# Patient Record
Sex: Male | Born: 1935 | Race: White | Hispanic: No | Marital: Married | Smoking: Former smoker
Health system: Southern US, Community
[De-identification: ages and names within clinical notes are randomized; demographics above are authoritative.]

## PROBLEM LIST (undated history)

## (undated) DIAGNOSIS — Z9289 Personal history of other medical treatment: Secondary | ICD-10-CM

## (undated) DIAGNOSIS — M2550 Pain in unspecified joint: Secondary | ICD-10-CM

## (undated) DIAGNOSIS — D126 Benign neoplasm of colon, unspecified: Secondary | ICD-10-CM

## (undated) DIAGNOSIS — N400 Enlarged prostate without lower urinary tract symptoms: Secondary | ICD-10-CM

## (undated) DIAGNOSIS — I7300 Raynaud's syndrome without gangrene: Secondary | ICD-10-CM

## (undated) DIAGNOSIS — Z9981 Dependence on supplemental oxygen: Secondary | ICD-10-CM

## (undated) DIAGNOSIS — D374 Neoplasm of uncertain behavior of colon: Secondary | ICD-10-CM

## (undated) DIAGNOSIS — G8929 Other chronic pain: Secondary | ICD-10-CM

## (undated) DIAGNOSIS — H269 Unspecified cataract: Secondary | ICD-10-CM

## (undated) DIAGNOSIS — E785 Hyperlipidemia, unspecified: Secondary | ICD-10-CM

## (undated) DIAGNOSIS — M1990 Unspecified osteoarthritis, unspecified site: Secondary | ICD-10-CM

## (undated) DIAGNOSIS — M549 Dorsalgia, unspecified: Secondary | ICD-10-CM

## (undated) DIAGNOSIS — I10 Essential (primary) hypertension: Secondary | ICD-10-CM

## (undated) DIAGNOSIS — R350 Frequency of micturition: Secondary | ICD-10-CM

## (undated) HISTORY — DX: Raynaud's syndrome without gangrene: I73.00

## (undated) HISTORY — DX: Benign neoplasm of colon, unspecified: D12.6

## (undated) HISTORY — PX: OTHER SURGICAL HISTORY: SHX169

## (undated) HISTORY — DX: Neoplasm of uncertain behavior of colon: D37.4

## (undated) HISTORY — PX: TONSILLECTOMY: SUR1361

## (undated) HISTORY — DX: Essential (primary) hypertension: I10

## (undated) HISTORY — DX: Hyperlipidemia, unspecified: E78.5

## (undated) HISTORY — PX: HERNIA REPAIR: SHX51

## (undated) HISTORY — PX: JOINT REPLACEMENT: SHX530

---
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
## 1993-11-12 HISTORY — PX: PROSTATE BIOPSY: SHX241

## 1998-07-13 ENCOUNTER — Encounter: Admission: RE | Admit: 1998-07-13 | Discharge: 1998-07-13 | Payer: Self-pay | Admitting: Sports Medicine

## 1998-10-14 ENCOUNTER — Encounter: Admission: RE | Admit: 1998-10-14 | Discharge: 1998-10-14 | Payer: Self-pay | Admitting: Family Medicine

## 1999-03-09 ENCOUNTER — Encounter: Admission: RE | Admit: 1999-03-09 | Discharge: 1999-03-09 | Payer: Self-pay | Admitting: Family Medicine

## 1999-12-06 ENCOUNTER — Encounter: Admission: RE | Admit: 1999-12-06 | Discharge: 1999-12-06 | Payer: Self-pay | Admitting: Family Medicine

## 1999-12-29 ENCOUNTER — Encounter: Admission: RE | Admit: 1999-12-29 | Discharge: 1999-12-29 | Payer: Self-pay | Admitting: Family Medicine

## 2000-01-31 ENCOUNTER — Encounter: Admission: RE | Admit: 2000-01-31 | Discharge: 2000-01-31 | Payer: Self-pay | Admitting: Sports Medicine

## 2000-06-14 DIAGNOSIS — D374 Neoplasm of uncertain behavior of colon: Secondary | ICD-10-CM

## 2000-06-14 HISTORY — DX: Neoplasm of uncertain behavior of colon: D37.4

## 2000-06-19 ENCOUNTER — Ambulatory Visit (HOSPITAL_COMMUNITY): Admission: RE | Admit: 2000-06-19 | Discharge: 2000-06-19 | Payer: Self-pay | Admitting: Gastroenterology

## 2000-06-19 ENCOUNTER — Encounter (INDEPENDENT_AMBULATORY_CARE_PROVIDER_SITE_OTHER): Payer: Self-pay | Admitting: *Deleted

## 2000-06-20 ENCOUNTER — Encounter: Admission: RE | Admit: 2000-06-20 | Discharge: 2000-06-20 | Payer: Self-pay | Admitting: Family Medicine

## 2000-09-03 ENCOUNTER — Encounter: Admission: RE | Admit: 2000-09-03 | Discharge: 2000-09-03 | Payer: Self-pay | Admitting: Family Medicine

## 2001-01-28 ENCOUNTER — Encounter: Admission: RE | Admit: 2001-01-28 | Discharge: 2001-01-28 | Payer: Self-pay | Admitting: Family Medicine

## 2001-08-14 DIAGNOSIS — D126 Benign neoplasm of colon, unspecified: Secondary | ICD-10-CM

## 2001-08-14 HISTORY — DX: Benign neoplasm of colon, unspecified: D12.6

## 2001-08-26 ENCOUNTER — Ambulatory Visit (HOSPITAL_COMMUNITY): Admission: RE | Admit: 2001-08-26 | Discharge: 2001-08-26 | Payer: Self-pay | Admitting: Gastroenterology

## 2001-08-26 ENCOUNTER — Encounter (INDEPENDENT_AMBULATORY_CARE_PROVIDER_SITE_OTHER): Payer: Self-pay

## 2001-12-16 ENCOUNTER — Encounter: Admission: RE | Admit: 2001-12-16 | Discharge: 2001-12-16 | Payer: Self-pay | Admitting: Family Medicine

## 2001-12-31 ENCOUNTER — Encounter: Admission: RE | Admit: 2001-12-31 | Discharge: 2001-12-31 | Payer: Self-pay | Admitting: Family Medicine

## 2002-02-21 ENCOUNTER — Encounter: Admission: RE | Admit: 2002-02-21 | Discharge: 2002-02-21 | Payer: Self-pay | Admitting: Family Medicine

## 2002-05-26 ENCOUNTER — Encounter: Admission: RE | Admit: 2002-05-26 | Discharge: 2002-05-26 | Payer: Self-pay | Admitting: Family Medicine

## 2003-03-31 ENCOUNTER — Encounter: Admission: RE | Admit: 2003-03-31 | Discharge: 2003-03-31 | Payer: Self-pay | Admitting: Family Medicine

## 2003-04-07 ENCOUNTER — Encounter: Admission: RE | Admit: 2003-04-07 | Discharge: 2003-04-07 | Payer: Self-pay | Admitting: Family Medicine

## 2003-06-03 ENCOUNTER — Encounter: Admission: RE | Admit: 2003-06-03 | Discharge: 2003-06-03 | Payer: Self-pay | Admitting: Family Medicine

## 2003-12-25 ENCOUNTER — Encounter: Admission: RE | Admit: 2003-12-25 | Discharge: 2003-12-25 | Payer: Self-pay | Admitting: Sports Medicine

## 2004-01-05 ENCOUNTER — Encounter: Admission: RE | Admit: 2004-01-05 | Discharge: 2004-01-05 | Payer: Self-pay | Admitting: Family Medicine

## 2004-01-06 ENCOUNTER — Encounter: Admission: RE | Admit: 2004-01-06 | Discharge: 2004-01-06 | Payer: Self-pay | Admitting: Family Medicine

## 2004-01-14 ENCOUNTER — Ambulatory Visit (HOSPITAL_COMMUNITY): Admission: RE | Admit: 2004-01-14 | Discharge: 2004-01-14 | Payer: Self-pay | Admitting: Family Medicine

## 2004-01-27 ENCOUNTER — Encounter: Admission: RE | Admit: 2004-01-27 | Discharge: 2004-01-27 | Payer: Self-pay | Admitting: Family Medicine

## 2004-05-31 ENCOUNTER — Ambulatory Visit: Payer: Self-pay | Admitting: Family Medicine

## 2004-08-16 ENCOUNTER — Ambulatory Visit: Payer: Self-pay | Admitting: Sports Medicine

## 2004-09-19 ENCOUNTER — Ambulatory Visit (HOSPITAL_COMMUNITY): Admission: RE | Admit: 2004-09-19 | Discharge: 2004-09-19 | Payer: Self-pay | Admitting: Gastroenterology

## 2004-09-20 ENCOUNTER — Ambulatory Visit: Payer: Self-pay | Admitting: Family Medicine

## 2004-09-26 ENCOUNTER — Ambulatory Visit: Payer: Self-pay | Admitting: Family Medicine

## 2004-11-09 ENCOUNTER — Ambulatory Visit (HOSPITAL_COMMUNITY): Admission: RE | Admit: 2004-11-09 | Discharge: 2004-11-09 | Payer: Self-pay | Admitting: Vascular Surgery

## 2004-11-11 ENCOUNTER — Encounter (INDEPENDENT_AMBULATORY_CARE_PROVIDER_SITE_OTHER): Payer: Self-pay | Admitting: Specialist

## 2004-11-11 ENCOUNTER — Inpatient Hospital Stay (HOSPITAL_COMMUNITY): Admission: RE | Admit: 2004-11-11 | Discharge: 2004-11-14 | Payer: Self-pay | Admitting: Vascular Surgery

## 2005-03-28 ENCOUNTER — Ambulatory Visit: Payer: Self-pay | Admitting: Family Medicine

## 2005-06-02 ENCOUNTER — Ambulatory Visit: Payer: Self-pay | Admitting: Family Medicine

## 2005-07-26 ENCOUNTER — Encounter: Payer: Self-pay | Admitting: Internal Medicine

## 2006-02-22 ENCOUNTER — Ambulatory Visit: Payer: Self-pay | Admitting: Family Medicine

## 2006-03-20 ENCOUNTER — Ambulatory Visit: Payer: Self-pay | Admitting: Family Medicine

## 2006-05-25 ENCOUNTER — Ambulatory Visit: Payer: Self-pay | Admitting: Family Medicine

## 2006-09-04 ENCOUNTER — Ambulatory Visit: Payer: Self-pay | Admitting: Sports Medicine

## 2006-09-11 ENCOUNTER — Ambulatory Visit: Payer: Self-pay | Admitting: Family Medicine

## 2006-09-20 ENCOUNTER — Ambulatory Visit: Payer: Self-pay | Admitting: Family Medicine

## 2006-10-11 DIAGNOSIS — R269 Unspecified abnormalities of gait and mobility: Secondary | ICD-10-CM | POA: Insufficient documentation

## 2006-10-11 DIAGNOSIS — L408 Other psoriasis: Secondary | ICD-10-CM | POA: Insufficient documentation

## 2006-10-11 DIAGNOSIS — E785 Hyperlipidemia, unspecified: Secondary | ICD-10-CM | POA: Insufficient documentation

## 2006-10-11 DIAGNOSIS — I10 Essential (primary) hypertension: Secondary | ICD-10-CM | POA: Insufficient documentation

## 2006-10-11 DIAGNOSIS — J439 Emphysema, unspecified: Secondary | ICD-10-CM | POA: Insufficient documentation

## 2006-10-11 DIAGNOSIS — J449 Chronic obstructive pulmonary disease, unspecified: Secondary | ICD-10-CM | POA: Insufficient documentation

## 2006-10-11 DIAGNOSIS — K649 Unspecified hemorrhoids: Secondary | ICD-10-CM | POA: Insufficient documentation

## 2006-10-23 ENCOUNTER — Ambulatory Visit: Payer: Self-pay | Admitting: Vascular Surgery

## 2006-11-20 ENCOUNTER — Telehealth: Payer: Self-pay | Admitting: Family Medicine

## 2007-03-18 ENCOUNTER — Encounter: Payer: Self-pay | Admitting: Family Medicine

## 2007-04-02 ENCOUNTER — Encounter: Payer: Self-pay | Admitting: Family Medicine

## 2007-04-08 ENCOUNTER — Encounter: Payer: Self-pay | Admitting: Family Medicine

## 2007-04-23 ENCOUNTER — Ambulatory Visit: Payer: Self-pay | Admitting: Family Medicine

## 2007-04-23 DIAGNOSIS — Q549 Hypospadias, unspecified: Secondary | ICD-10-CM | POA: Insufficient documentation

## 2007-04-23 LAB — CONVERTED CEMR LAB
ALT: 20 units/L (ref 0–53)
AST: 17 units/L (ref 0–37)
Albumin: 4.5 g/dL (ref 3.5–5.2)
Alkaline Phosphatase: 68 units/L (ref 39–117)
BUN: 9 mg/dL (ref 6–23)
CO2: 26 meq/L (ref 19–32)
Calcium: 9.9 mg/dL (ref 8.4–10.5)
Chloride: 103 meq/L (ref 96–112)
Cholesterol: 155 mg/dL (ref 0–200)
Creatinine, Ser: 0.82 mg/dL (ref 0.40–1.50)
Glucose, Bld: 100 mg/dL — ABNORMAL HIGH (ref 70–99)
HCT: 45.7 % (ref 39.0–52.0)
HDL: 58 mg/dL (ref >39)
Hemoglobin: 15.6 g/dL (ref 13.0–17.0)
LDL Cholesterol: 71 mg/dL (ref 0–99)
MCHC: 34.1 g/dL (ref 30.0–36.0)
MCV: 92.9 fL (ref 78.0–100.0)
PSA: 4.4 ng/mL — ABNORMAL HIGH (ref 0.10–4.00)
Platelets: 252 10*3/uL (ref 150–400)
Potassium: 3.7 meq/L (ref 3.5–5.3)
RBC: 4.92 M/uL (ref 4.22–5.81)
RDW: 12.7 % (ref 11.5–14.0)
Sodium: 141 meq/L (ref 135–145)
Total Bilirubin: 0.9 mg/dL (ref 0.3–1.2)
Total CHOL/HDL Ratio: 2.7
Total Protein: 7 g/dL (ref 6.0–8.3)
Triglycerides: 129 mg/dL (ref <150)
VLDL: 26 mg/dL (ref 0–40)
WBC: 7.5 10*3/uL (ref 4.0–10.5)

## 2007-04-24 ENCOUNTER — Encounter: Payer: Self-pay | Admitting: Family Medicine

## 2007-05-27 ENCOUNTER — Encounter: Payer: Self-pay | Admitting: Family Medicine

## 2007-05-29 ENCOUNTER — Ambulatory Visit: Payer: Self-pay | Admitting: Family Medicine

## 2007-07-03 ENCOUNTER — Telehealth: Payer: Self-pay | Admitting: *Deleted

## 2007-10-16 ENCOUNTER — Telehealth: Payer: Self-pay | Admitting: Family Medicine

## 2007-10-22 ENCOUNTER — Ambulatory Visit: Payer: Self-pay | Admitting: Vascular Surgery

## 2008-05-13 ENCOUNTER — Ambulatory Visit: Payer: Self-pay | Admitting: Family Medicine

## 2008-05-22 ENCOUNTER — Telehealth: Payer: Self-pay | Admitting: Family Medicine

## 2008-06-17 ENCOUNTER — Ambulatory Visit: Payer: Self-pay | Admitting: Family Medicine

## 2008-06-17 DIAGNOSIS — N138 Other obstructive and reflux uropathy: Secondary | ICD-10-CM | POA: Insufficient documentation

## 2008-06-17 DIAGNOSIS — N401 Enlarged prostate with lower urinary tract symptoms: Secondary | ICD-10-CM

## 2008-06-17 LAB — CONVERTED CEMR LAB
ALT: 19 units/L (ref 0–53)
AST: 19 units/L (ref 0–37)
Albumin: 4.4 g/dL (ref 3.5–5.2)
Alkaline Phosphatase: 75 units/L (ref 39–117)
BUN: 12 mg/dL (ref 6–23)
CO2: 27 meq/L (ref 19–32)
Calcium: 9.6 mg/dL (ref 8.4–10.5)
Chloride: 102 meq/L (ref 96–112)
Cholesterol: 143 mg/dL (ref 0–200)
Creatinine, Ser: 0.8 mg/dL (ref 0.40–1.50)
Glucose, Bld: 96 mg/dL (ref 70–99)
HCT: 44.9 % (ref 39.0–52.0)
HDL: 43 mg/dL (ref >39)
Hemoglobin: 15.4 g/dL (ref 13.0–17.0)
LDL Cholesterol: 76 mg/dL (ref 0–99)
MCHC: 34.3 g/dL (ref 30.0–36.0)
MCV: 94.3 fL (ref 78.0–100.0)
PSA: 4.15 ng/mL — ABNORMAL HIGH (ref 0.10–4.00)
Platelets: 200 10*3/uL (ref 150–400)
Potassium: 4.5 meq/L (ref 3.5–5.3)
RBC: 4.76 M/uL (ref 4.22–5.81)
RDW: 12.9 % (ref 11.5–15.5)
Sodium: 140 meq/L (ref 135–145)
Total Bilirubin: 0.8 mg/dL (ref 0.3–1.2)
Total CHOL/HDL Ratio: 3.3
Total Protein: 6.9 g/dL (ref 6.0–8.3)
Triglycerides: 122 mg/dL (ref <150)
VLDL: 24 mg/dL (ref 0–40)
WBC: 6.3 10*3/uL (ref 4.0–10.5)

## 2008-06-23 ENCOUNTER — Ambulatory Visit: Payer: Self-pay | Admitting: Family Medicine

## 2008-06-23 LAB — CONVERTED CEMR LAB
Cholesterol, target level: 200 mg/dL
HDL goal, serum: 40 mg/dL
LDL Goal: 130 mg/dL

## 2008-08-14 HISTORY — PX: POPLITEAL VENOUS ANEURYSM REPAIR: SHX2244

## 2008-11-10 ENCOUNTER — Ambulatory Visit: Payer: Self-pay | Admitting: Vascular Surgery

## 2008-12-22 ENCOUNTER — Ambulatory Visit: Payer: Self-pay | Admitting: Family Medicine

## 2008-12-22 LAB — CONVERTED CEMR LAB
BUN: 12 mg/dL (ref 6–23)
CO2: 25 meq/L (ref 19–32)
Calcium: 10.1 mg/dL (ref 8.4–10.5)
Chloride: 103 meq/L (ref 96–112)
Creatinine, Ser: 0.89 mg/dL (ref 0.40–1.50)
Glucose, Bld: 101 mg/dL — ABNORMAL HIGH (ref 70–99)
Potassium: 3.9 meq/L (ref 3.5–5.3)
Sodium: 141 meq/L (ref 135–145)
Vit D, 25-Hydroxy: 36 ng/mL (ref 30–89)

## 2008-12-25 ENCOUNTER — Encounter: Payer: Self-pay | Admitting: Family Medicine

## 2009-06-01 ENCOUNTER — Ambulatory Visit: Payer: Self-pay | Admitting: Family Medicine

## 2009-10-12 ENCOUNTER — Encounter: Payer: Self-pay | Admitting: Family Medicine

## 2009-10-12 LAB — HM COLONOSCOPY: HM Colonoscopy: NORMAL

## 2009-10-19 ENCOUNTER — Ambulatory Visit: Payer: Self-pay | Admitting: Vascular Surgery

## 2009-11-25 ENCOUNTER — Ambulatory Visit: Payer: Self-pay | Admitting: Family Medicine

## 2009-11-25 LAB — CONVERTED CEMR LAB
ALT: 16 units/L (ref 0–53)
AST: 15 units/L (ref 0–37)
Albumin: 4.6 g/dL (ref 3.5–5.2)
Alkaline Phosphatase: 56 units/L (ref 39–117)
BUN: 13 mg/dL (ref 6–23)
CO2: 26 meq/L (ref 19–32)
Calcium: 9.3 mg/dL (ref 8.4–10.5)
Chloride: 103 meq/L (ref 96–112)
Cholesterol: 152 mg/dL (ref 0–200)
Creatinine, Ser: 0.82 mg/dL (ref 0.40–1.50)
Glucose, Bld: 97 mg/dL (ref 70–99)
HCT: 42.5 % (ref 39.0–52.0)
HDL: 59 mg/dL (ref >39)
Hemoglobin: 14.3 g/dL (ref 13.0–17.0)
LDL Cholesterol: 76 mg/dL (ref 0–99)
MCHC: 33.6 g/dL (ref 30.0–36.0)
MCV: 94.9 fL (ref 78.0–100.0)
Platelets: 219 10*3/uL (ref 150–400)
Potassium: 3.8 meq/L (ref 3.5–5.3)
RBC: 4.48 M/uL (ref 4.22–5.81)
RDW: 13.2 % (ref 11.5–15.5)
Sodium: 141 meq/L (ref 135–145)
Total Bilirubin: 0.5 mg/dL (ref 0.3–1.2)
Total CHOL/HDL Ratio: 2.6
Total Protein: 6.6 g/dL (ref 6.0–8.3)
Triglycerides: 83 mg/dL (ref <150)
VLDL: 17 mg/dL (ref 0–40)
WBC: 6.9 10*3/uL (ref 4.0–10.5)

## 2009-11-30 ENCOUNTER — Encounter: Payer: Self-pay | Admitting: Family Medicine

## 2009-12-21 ENCOUNTER — Telehealth: Payer: Self-pay | Admitting: *Deleted

## 2009-12-22 ENCOUNTER — Ambulatory Visit: Payer: Self-pay | Admitting: Family Medicine

## 2010-01-11 ENCOUNTER — Telehealth: Payer: Self-pay | Admitting: Family Medicine

## 2010-01-11 ENCOUNTER — Ambulatory Visit: Payer: Self-pay | Admitting: Family Medicine

## 2010-01-13 ENCOUNTER — Encounter: Admission: RE | Admit: 2010-01-13 | Discharge: 2010-01-13 | Payer: Self-pay | Admitting: Family Medicine

## 2010-01-14 ENCOUNTER — Encounter: Payer: Self-pay | Admitting: Family Medicine

## 2010-01-24 ENCOUNTER — Encounter: Payer: Self-pay | Admitting: Family Medicine

## 2010-02-08 ENCOUNTER — Ambulatory Visit: Payer: Self-pay | Admitting: Family Medicine

## 2010-02-08 ENCOUNTER — Telehealth: Payer: Self-pay | Admitting: Family Medicine

## 2010-02-08 LAB — CONVERTED CEMR LAB
ALT: 13 units/L (ref 0–53)
AST: 13 units/L (ref 0–37)
Albumin: 3.9 g/dL (ref 3.5–5.2)
Alkaline Phosphatase: 55 units/L (ref 39–117)
BUN: 10 mg/dL (ref 6–23)
Basophils Absolute: 0 10*3/uL (ref 0.0–0.1)
Basophils Relative: 0 % (ref 0–1)
Bilirubin Urine: NEGATIVE
Blood in Urine, dipstick: NEGATIVE
CO2: 26 meq/L (ref 19–32)
Calcium: 9.2 mg/dL (ref 8.4–10.5)
Chloride: 99 meq/L (ref 96–112)
Creatinine, Ser: 0.84 mg/dL (ref 0.40–1.50)
Eosinophils Absolute: 0.1 10*3/uL (ref 0.0–0.7)
Eosinophils Relative: 1 % (ref 0–5)
Glucose, Bld: 96 mg/dL (ref 70–99)
Glucose, Urine, Semiquant: NEGATIVE
HCT: 41.7 % (ref 39.0–52.0)
Hemoglobin: 14.4 g/dL (ref 13.0–17.0)
Ketones, urine, test strip: NEGATIVE
Lymphocytes Relative: 13 % (ref 12–46)
Lymphs Abs: 1.8 10*3/uL (ref 0.7–4.0)
MCHC: 34.5 g/dL (ref 30.0–36.0)
MCV: 91.6 fL (ref 78.0–100.0)
Monocytes Absolute: 1.6 10*3/uL — ABNORMAL HIGH (ref 0.1–1.0)
Monocytes Relative: 11 % (ref 3–12)
Neutro Abs: 10.9 10*3/uL — ABNORMAL HIGH (ref 1.7–7.7)
Neutrophils Relative %: 75 % (ref 43–77)
Nitrite: NEGATIVE
Platelets: 227 10*3/uL (ref 150–400)
Potassium: 3.5 meq/L (ref 3.5–5.3)
Protein, U semiquant: 30
RBC: 4.55 M/uL (ref 4.22–5.81)
RDW: 12.6 % (ref 11.5–15.5)
Sodium: 138 meq/L (ref 135–145)
Specific Gravity, Urine: 1.02
Total Bilirubin: 1.1 mg/dL (ref 0.3–1.2)
Total Protein: 6.3 g/dL (ref 6.0–8.3)
Urobilinogen, UA: 0.2
WBC Urine, dipstick: NEGATIVE
WBC: 14.5 10*3/uL — ABNORMAL HIGH (ref 4.0–10.5)
pH: 8.5

## 2010-02-09 ENCOUNTER — Encounter: Payer: Self-pay | Admitting: Family Medicine

## 2010-02-09 ENCOUNTER — Telehealth: Payer: Self-pay | Admitting: Family Medicine

## 2010-03-18 ENCOUNTER — Ambulatory Visit: Payer: Self-pay | Admitting: Family Medicine

## 2010-03-18 DIAGNOSIS — F17201 Nicotine dependence, unspecified, in remission: Secondary | ICD-10-CM | POA: Insufficient documentation

## 2010-04-01 ENCOUNTER — Telehealth: Payer: Self-pay | Admitting: Family Medicine

## 2010-05-04 ENCOUNTER — Telehealth: Payer: Self-pay | Admitting: Family Medicine

## 2010-06-07 ENCOUNTER — Ambulatory Visit: Payer: Self-pay | Admitting: Family Medicine

## 2010-06-08 ENCOUNTER — Ambulatory Visit: Payer: Self-pay | Admitting: Sports Medicine

## 2010-06-08 DIAGNOSIS — M25559 Pain in unspecified hip: Secondary | ICD-10-CM

## 2010-06-08 DIAGNOSIS — Z96649 Presence of unspecified artificial hip joint: Secondary | ICD-10-CM | POA: Insufficient documentation

## 2010-06-08 DIAGNOSIS — G8929 Other chronic pain: Secondary | ICD-10-CM | POA: Insufficient documentation

## 2010-06-13 ENCOUNTER — Telehealth: Payer: Self-pay | Admitting: *Deleted

## 2010-07-15 ENCOUNTER — Ambulatory Visit: Payer: Self-pay | Admitting: Family Medicine

## 2010-09-15 NOTE — Assessment & Plan Note (Signed)
Summary: f/up/flu shot,tcb   Vital Signs:  Patient profile:   77 year old male Height:      68 inches Weight:      168.7 pounds BMI:     25.74 Pulse rate:   72 / minute BP sitting:   129 / 70  (left arm)  Vitals Entered By: Arlyss Repress CMA, (June 07, 2010 1:32 PM) CC: f/up visit. flu shot., Hypertension Management Is Patient Diabetic? No Pain Assessment Patient in pain? no        Primary Care Provider:  Zachery Dauer MD  CC:  f/up visit. flu shot. and Hypertension Management.  History of Present Illness: 5 days ago strained left neck lifting light wts in a different way. Hurts on the left when he turns his head to the right. Mild  Otherwise doesnt' remember why he needed to come in, but I recall that he had stopped taking his Lipitor/amlodipine combination and wife insisted he restart it and follow-up with me. I informed him that it is soon going generic. He denies generalized myalgias. Only gets the pain in the right hip that limits his walking.  Will soon be making a business trip to [place], [place].   No recent bronchitis, Only uses the Advair and albuterol when he has symptoms. Recently breathing well. Doesn't want to repeat the PFT's.   Hypertension History:      He denies chest pain, dyspnea with exertion, peripheral edema, and syncope.  He notes no problems with any antihypertensive medication side effects.        Positive major cardiovascular risk factors include male age 68 years old or older, hyperlipidemia, and hypertension.  Negative major cardiovascular risk factors include no history of diabetes and non-tobacco-user status.        Further assessment for target organ damage reveals no history of ASHD, stroke/TIA, or peripheral vascular disease.     Habits & Providers  Alcohol-Tobacco-Diet     Tobacco Status: quit > 6 months  Current Medications (verified): 1)  Caduet 10-20 Mg Tabs (Amlodipine-Atorvastatin) .... Take 1 Tablet By Mouth Daily 2)  Hydrochlorothiazide  25 Mg Tabs (Hydrochlorothiazide) .... Take One Tablet Daily 3)  Therapeutic Multivitamin   Tabs (Multiple Vitamin) .... Take One Tablet Daily 4)  Glucosamine-Chondroitin 1500-1200 Mg/18ml  Liqd (Glucosamine-Chondroitin) .... Take Three  Tablest Daily 5)  Vectical 3 Mcg/gm Oint (Calcitriol) .... Apply Two Times A Day Prn 6)  Fish Oil   Oil (Fish Oil) .... One Cap Daily 7)  Proair Hfa 108 (90 Base) Mcg/act  Aers (Albuterol Sulfate) .... 2 Inh Q4h As Needed Shortness of Breath 8)  Tamsulosin Hcl 0.4 Mg Caps (Tamsulosin Hcl) .... One At Bedtime 9)  Aspirin 325 Mg  Tabs (Aspirin) .... Once Daily 10)  Advair Diskus 250-50 Mcg/dose Aepb (Fluticasone-Salmeterol) .... One Inhalation  Allergies (verified): No Known Drug Allergies  Social History: Smoking Status:  quit > 6 months  Physical Exam  General:  alert and well-developed.   Neck:  supple and full ROM.   Lungs:  Normal respiratory effort, chest expands symmetrically. no crackles and no wheezes.   Heart:  Normal rate and regular rhythm. S1 and S2 normal without gallop, murmur, click, rub or other extra sounds. Msk:  Pain with leaning his head to the right on the left side of his neck without radiation to his arms. Skin:  Patches of psorriatic rash and areas of atrophy, mainly on legs and back Psych:  Oriented X3, normally interactive, good eye contact, not  anxious appearing, and not depressed appearing.     Impression & Recommendations:  Problem # 1:  BRONCHITIS, CHRONIC (ICD-491.9) Emphysema a better designation. Doesn't want to take the time to do the PFT's. Will restart the Advair if has more symptoms.   Problem # 2:  HYPERTENSION, BENIGN SYSTEMIC (ICD-401.1) Assessment: Improved  His updated medication list for this problem includes:    Caduet 10-20 Mg Tabs (Amlodipine-atorvastatin) .Marland Kitchen... Take 1 tablet by mouth daily    Hydrochlorothiazide 25 Mg Tabs (Hydrochlorothiazide) .Marland Kitchen... Take one tablet daily  Orders: [facility]- Est Level  3  (45409)  Problem # 3:  HYPERTROPHY PROSTATE W/O UR OBST & OTH LUTS (ICD-600.00) No problems, continue Tamsulosin.  Complete Medication List: 1)  Caduet 10-20 Mg Tabs (Amlodipine-atorvastatin) .... Take 1 tablet by mouth daily 2)  Hydrochlorothiazide 25 Mg Tabs (Hydrochlorothiazide) .... Take one tablet daily 3)  Therapeutic Multivitamin Tabs (Multiple vitamin) .... Take one tablet daily 4)  Glucosamine-chondroitin 1500-1200 Mg/72ml Liqd (Glucosamine-chondroitin) .... Take three  tablest daily 5)  Vectical 3 Mcg/gm Oint (Calcitriol) .... Apply two times a day prn 6)  Fish Oil Oil (Fish oil) .... One cap daily 7)  Proair Hfa 108 (90 Base) Mcg/act Aers (Albuterol sulfate) .... 2 inh q4h as needed shortness of breath 8)  Tamsulosin Hcl 0.4 Mg Caps (Tamsulosin hcl) .... One at bedtime 9)  Aspirin 325 Mg Tabs (Aspirin) .... Once daily 10)  Advair Diskus 250-50 Mcg/dose Aepb (Fluticasone-salmeterol) .... One inhalation  Other Orders: Influenza Vaccine MCR (81191)  Hypertension Assessment/Plan:      The patient's hypertensive risk group is category B: At least one risk factor (excluding diabetes) with no target organ damage.  His calculated 10 year risk of coronary heart disease is 9 %.  Today's blood pressure is 129/70.  His blood pressure goal is < 140/90.  Patient Instructions: 1)  Please schedule pulmonary function testing with the pharmacy team when you are breathing well. 2)  Restart the Advair if you start being short of breath and use the albuterol for immediate relief.  3)  Please schedule a follow-up appointment in 6 months .    Orders Added: 1)  Influenza Vaccine MCR [00025] 2)  [facility]- Est Level  3 [47829]   Immunizations Administered:  Influenza Vaccine # 1:    Vaccine Type: Fluvax MCR    Site: left deltoid    Mfr: GlaxoSmithKline    Dose: 0.5 ml    Route: IM    Given by: Arlyss Repress CMA,    Exp. Date: 02/08/2011    Lot #: [id number]    VIS given: 03/08/10 version given  June 07, 2010.  Flu Vaccine Consent Questions:    Do you have a history of severe allergic reactions to this vaccine? no    Any prior history of allergic reactions to egg and/or gelatin? no    Do you have a sensitivity to the preservative Thimersol? no    Do you have a past history of Guillan-Barre Syndrome? no    Do you currently have an acute febrile illness? no    Have you ever had a severe reaction to latex? no    Vaccine information given and explained to patient? yes   Immunizations Administered:  Influenza Vaccine # 1:    Vaccine Type: Fluvax MCR    Site: left deltoid    Mfr: GlaxoSmithKline    Dose: 0.5 ml    Route: IM    Given by: Arlyss Repress CMA,  Exp. Date: 02/08/2011    Lot #: [id number]    VIS given: 03/08/10 version given June 07, 2010.     Prevention & Chronic Care Immunizations   Influenza vaccine: Fluvax MCR  (06/07/2010)   Influenza vaccine due: 05/13/2009    Tetanus booster: 02/11/2006: Done.   Tetanus booster due: 02/12/2016    Pneumococcal vaccine: Done.  (11/13/1999)   Pneumococcal vaccine due: None    H. zoster vaccine: 09/20/2006: given  Colorectal Screening   Hemoccult: Done.  (12/13/1999)   Hemoccult due: 12/12/2000    Colonoscopy: Results: Normal. Results: Hemorrhoids.     Location:  [facility].     (10/12/2009)   Colonoscopy action/deferral: Repeat colonoscopy in 5 years.   (10/12/2009)   Colonoscopy due: 08/15/2011  Other Screening   PSA: 4.15  (06/17/2008)   PSA due due: 06/17/2009   Smoking status: quit > 6 months  (06/07/2010)  Lipids   Total Cholesterol: 152  (11/25/2009)   LDL: 76  (11/25/2009)   LDL Direct: Not documented   HDL: 59  (11/25/2009)   Triglycerides: 83  (11/25/2009)    SGOT (AST): 13  (02/08/2010)   SGPT (ALT): 13  (02/08/2010)   Alkaline phosphatase: 55  (02/08/2010)   Total bilirubin: 1.1  (02/08/2010)    Lipid flowsheet reviewed?: Yes   Progress toward LDL goal: At  goal  Hypertension   Last Blood Pressure: 129 / 70  (06/07/2010)   Serum creatinine: 0.84  (02/08/2010)   Serum potassium 3.5  (02/08/2010)    Hypertension flowsheet reviewed?: Yes   Progress toward BP goal: At goal  Self-Management Support :   Personal Goals (by the next clinic visit) :      Personal blood pressure goal: 140/90  (11/25/2009)     Personal LDL goal: 130  (11/25/2009)    Patient will work on the following items until the next clinic visit to reach self-care goals:     Medications and monitoring: take my medicines every day  (06/07/2010)     Eating: limit or avoid alcohol  (06/07/2010)     Activity: take a 30 minute walk every day  (06/07/2010)    Hypertension self-management support: Education handout, Written self-care plan  (11/25/2009)    Lipid self-management support: Not documented     Appended Document: f/up/flu shot,tcb    Clinical Lists Changes  Problems: Removed problem of MYALGIA (ICD-729.1) Removed problem of DYSURIA (ICD-788.1) Removed problem of AFTERCARE, LONG-TERM USE, MEDICATIONS NEC (ICD-V58.69) Added new problem of HIP PAIN, RIGHT, CHRONIC (ICD-719.45) Removed problem of COUGH (ICD-786.2) Added new problem of HIP REPLACEMENT, RIGHT, HX OF (ICD-V43.64)

## 2010-09-15 NOTE — Miscellaneous (Signed)
Summary: [facility]

## 2010-09-15 NOTE — Assessment & Plan Note (Signed)
Summary: Cheratussin refill  Prescriptions: CHERATUSSIN AC 100-10 MG/5ML SYRP (GUAIFENESIN-CODEINE) Take 2 tsp every 4 hours as needed cough  #4 fl oz x 2   Entered and Authorized by:   Zachery Dauer MD   Signed by:   Zachery Dauer MD on 01/24/2010   Method used:   Historical   RxID:   [id number]

## 2010-09-15 NOTE — Assessment & Plan Note (Signed)
Summary: PFT  F/U - Rx Clinic   Vital Signs:  Patient profile:   77 year old male Weight:      168 pounds Pulse rate:   83 / minute BP sitting:   164 / 91  (left arm)  Current Medications (verified): 1)  Caduet 10-20 Mg Tabs (Amlodipine-Atorvastatin) .... Take 1 Tablet By Mouth Daily 2)  Hydrochlorothiazide 25 Mg Tabs (Hydrochlorothiazide) .... Take One Tablet Daily 3)  Therapeutic Multivitamin   Tabs (Multiple Vitamin) .... Take One Tablet Daily 4)  Glucosamine-Chondroitin 1500-1200 Mg/43ml  Liqd (Glucosamine-Chondroitin) .... Take Three Tablets Daily 5)  Vectical 3 Mcg/gm Oint (Calcitriol) .... Apply Two Times A Day Prn 6)  Fish Oil   Oil (Fish Oil) .... One Cap Daily 7)  Proair Hfa 108 (90 Base) Mcg/act  Aers (Albuterol Sulfate) .Marland Kitchen.. 1-2 Inh Q4h As Needed Shortness of Breath and Prior To Exercise 8)  Tamsulosin Hcl 0.4 Mg Caps (Tamsulosin Hcl) .... Once Daily 9)  Aspirin 325 Mg  Tabs (Aspirin) .... Once Daily  Allergies (verified): No Known Drug Allergies   Impression & Recommendations:  Problem # 1:  EMPHYSEMA (ICD-492.8) Assessment Improved  Following last flare of bronchitis patient reports he has returned to previous baseline.  He denies use of Advair stating that he does NOT see the benefit of starting another medication.  Use of albuterol is as a preventive prior to exercise (stationary bike) 3 times weekly.   He is NOT interested in using advair at this time.  Reviewed use as a preventive of number and severity of exacerbations in the future.   Patient expressed understanding of this use but notes that he has only had 2 exacerbations in the past 5 years.     Of NOTE he had a Pneumovax in 11/1999.   He could be considered for another vaccination at next visit with Dr. Sheffield Slider.   TTFFC: 12 minutes.   Seen with Sheliah Mends PharmD Resident.   [facility]  Complete Medication List: 1)  Caduet 10-20 Mg Tabs (Amlodipine-atorvastatin) .... Take 1  tablet by mouth daily 2)  Hydrochlorothiazide 25 Mg Tabs (Hydrochlorothiazide) .... Take one tablet daily 3)  Therapeutic Multivitamin Tabs (Multiple vitamin) .... Take one tablet daily 4)  Glucosamine-chondroitin 1500-1200 Mg/71ml Liqd (Glucosamine-chondroitin) .... Take three tablets daily 5)  Vectical 3 Mcg/gm Oint (Calcitriol) .... Apply two times a day prn 6)  Fish Oil Oil (Fish oil) .... One cap daily 7)  Proair Hfa 108 (90 Base) Mcg/act Aers (Albuterol sulfate) .Marland Kitchen.. 1-2 inh q4h as needed shortness of breath and prior to exercise 8)  Tamsulosin Hcl 0.4 Mg Caps (Tamsulosin hcl) .... Once daily 9)  Aspirin 325 Mg Tabs (Aspirin) .... Once daily Prescriptions: PROAIR HFA 108 (90 BASE) MCG/ACT  AERS (ALBUTEROL SULFATE) 1-2 inh q4h as needed shortness of breath and prior to exercise  #1 x 0   Entered and Authorized by:   Christian Mate D   Signed by:   Madelon Lips Pharm D on 07/15/2010   Method used:   Historical   RxID:   [id number] TAMSULOSIN HCL 0.4 MG CAPS (TAMSULOSIN HCL) once daily  #1 x 0   Entered and Authorized by:   Christian Mate D   Signed by:   Madelon Lips Pharm D on 07/15/2010   Method used:   Historical   RxID:   [id number] GLUCOSAMINE-CHONDROITIN 1500-1200 MG/30ML  LIQD (GLUCOSAMINE-CHONDROITIN) Take three tablets daily  #1 x 0  Entered and Authorized by:   Christian Mate D   Signed by:   Madelon Lips Pharm D on 07/15/2010   Method used:   Historical   RxID:   [id number]    Orders Added: 1)  Reassessment Each 15 min unit- [facility] [[id number]]    Prevention & Chronic Care Immunizations   Influenza vaccine: Fluvax MCR  (06/07/2010)   Influenza vaccine due: 05/13/2009    Tetanus booster: 02/11/2006: Done.   Tetanus booster due: 02/12/2016    Pneumococcal vaccine: Done.  (11/13/1999)   Pneumococcal vaccine due: None    H. zoster vaccine: 09/20/2006: given  Colorectal Screening   Hemoccult: Done.  (12/13/1999)   Hemoccult due:  12/12/2000    Colonoscopy: Results: Normal. Results: Hemorrhoids.     Location:  [facility].     (10/12/2009)   Colonoscopy action/deferral: Repeat colonoscopy in 5 years.   (10/12/2009)   Colonoscopy due: 08/15/2011  Other Screening   PSA: 4.15  (06/17/2008)   PSA due due: 06/17/2009   Smoking status: quit > 6 months  (06/07/2010)  Lipids   Total Cholesterol: 152  (11/25/2009)   LDL: 76  (11/25/2009)   LDL Direct: Not documented   HDL: 59  (11/25/2009)   Triglycerides: 83  (11/25/2009)    SGOT (AST): 13  (02/08/2010)   SGPT (ALT): 13  (02/08/2010)   Alkaline phosphatase: 55  (02/08/2010)   Total bilirubin: 1.1  (02/08/2010)  Hypertension   Last Blood Pressure: 164 / 91  (07/15/2010)   Serum creatinine: 0.84  (02/08/2010)   Serum potassium 3.5  (02/08/2010)  Self-Management Support :   Personal Goals (by the next clinic visit) :      Personal blood pressure goal: 140/90  (11/25/2009)     Personal LDL goal: 130  (11/25/2009)    Hypertension self-management support: Education handout, Written self-care plan  (11/25/2009)    Lipid self-management support: Not documented

## 2010-09-15 NOTE — Consult Note (Signed)
Summary: [facility]   Imported By: Clydell Hakim 10/19/2009 16:46:48  _____________________________________________________________________  External Attachment:    Type:   Image     Comment:   External Document  Appended Document: [facility]    Clinical Lists Changes  Observations: Added new observation of COLONRECACT: Repeat colonoscopy in 5 years.  (10/12/2009 21:06) Added new observation of COLONOSCOPY: Results: Normal. Results: Hemorrhoids.     Location:  [facility].    (10/12/2009 21:06)       Colonoscopy  Procedure date:  10/12/2009  Findings:      Results: Normal. Results: Hemorrhoids.     Location:  [facility].     Comments:      Repeat colonoscopy in 5 years.    Colonoscopy  Procedure date:  10/12/2009  Findings:      Results: Normal. Results: Hemorrhoids.     Location:  [facility].     Comments:      Repeat colonoscopy in 5 years.

## 2010-09-15 NOTE — Consult Note (Signed)
[facility]   Imported By: Haydee Salter 06/04/2007 13:54:02  _____________________________________________________________________  External Attachment:    Type:   Image     Comment:   External Document

## 2010-09-15 NOTE — Assessment & Plan Note (Signed)
Summary: cough medicine  Changed to Codeine cough medicine due to need to avoid Hyoscyamine in BPH patient

## 2010-09-15 NOTE — Assessment & Plan Note (Signed)
Summary: NECK/SHOULDER PAIN/INJURY,MC   Vital Signs:  Patient profile:   77 year old male BP sitting:   134 / 80  Vitals Entered By: Lillia Pauls CMA (June 08, 2010 1:43 PM)   Primary Provider:  Zachery Dauer MD   History of Present Illness: 77 yo M here for 5-6 days of left neck pain, worst with turning his head to the left.  Thinks came on with lifting some weights.  5-6/10, sharp but mostly achy quality.  Tried ice and hot bath, but having no improvement. ASA with minimal improvement. Feels getting worse.  Sleeping well.  No radicular symptoms or weakness.  Allergies: No Known Drug Allergies  Physical Exam  General:  Well-developed,well-nourished,in no acute distress; alert,appropriate and cooperative throughout examination Neck:  supple.  mild decreased ROM to Lt side that causes some pain.  + ttp over L paracervical muscles and mild trigger point over L trap.  - Spurling's b/l Msk:  nl shoulder ROM Neurologic:  nl strength and SILT b/l UE Psych:  Oriented X3.     Impression & Recommendations:  Problem # 1:  CERVICAL STRAIN, ACUTE (ICD-847.0) Reassured patient that it will take time to improve - trial of NSAID and muscle relaxer.  NSAID no longer than 1 month given HTN hx. - rehab exercises and hot towel massages - see pt instructions - f/u 1 month if needed  His updated medication list for this problem includes:    Aspirin 325 Mg Tabs (Aspirin) ..... Once daily    Meloxicam 15 Mg Tabs (Meloxicam) ..... One by mouth daily    Methocarbamol 500 Mg Tabs (Methocarbamol) .Marland Kitchen... 1 tab by mouth three times a day as needed for spasm  Complete Medication List: 1)  Caduet 10-20 Mg Tabs (Amlodipine-atorvastatin) .... Take 1 tablet by mouth daily 2)  Hydrochlorothiazide 25 Mg Tabs (Hydrochlorothiazide) .... Take one tablet daily 3)  Therapeutic Multivitamin Tabs (Multiple vitamin) .... Take one tablet daily 4)  Glucosamine-chondroitin 1500-1200 Mg/38ml Liqd  (Glucosamine-chondroitin) .... Take three  tablest daily 5)  Vectical 3 Mcg/gm Oint (Calcitriol) .... Apply two times a day prn 6)  Fish Oil Oil (Fish oil) .... One cap daily 7)  Proair Hfa 108 (90 Base) Mcg/act Aers (Albuterol sulfate) .... 2 inh q4h as needed shortness of breath 8)  Tamsulosin Hcl 0.4 Mg Caps (Tamsulosin hcl) .... One at bedtime 9)  Aspirin 325 Mg Tabs (Aspirin) .... Once daily 10)  Advair Diskus 250-50 Mcg/dose Aepb (Fluticasone-salmeterol) .... One inhalation 11)  Meloxicam 15 Mg Tabs (Meloxicam) .... One by mouth daily 12)  Methocarbamol 500 Mg Tabs (Methocarbamol) .Marland Kitchen.. 1 tab by mouth three times a day as needed for spasm  Patient Instructions: 1)  Try meloxicam daily for 1 month. 2)  Try methocarbomol 3 times daily as needed for 3 weeks. 3)  Do neck exercises 1-2 times daily. 4)  Hot towel massages 1-2 times daily. 5)  Please schedule a follow-up appointment in 1 month if you are still having problems and we can consider doing a trigger point injection. Prescriptions: METHOCARBAMOL 500 MG TABS (METHOCARBAMOL) 1 tab by mouth three times a day as needed for spasm  #60 x 0   Entered and Authorized by:   Corbin Ade MD   Signed by:   Corbin Ade MD on 06/08/2010   Method used:   Electronically to        [id number] (retail)       [address]  [place], [place]  [address]       Ph: [phone]       Fax: [phone]   RxID:   [id number] MELOXICAM 15 MG TABS (MELOXICAM) one by mouth daily  #30 x 0   Entered and Authorized by:   Corbin Ade MD   Signed by:   Corbin Ade MD on 06/08/2010   Method used:   Electronically to        [facility]* (retail)       [address]       [place], [place]  [address]       Ph: [phone]       Fax: [phone]   RxID:   [id number]    Orders Added: 1)  Est. Patient Level III [88416]  Appended Document: NECK/SHOULDER PAIN/INJURY,MC    Phone Note Outgoing Call   Call placed by: Zachery Dauer MD,  June 09, 2010  3:57 PM Summary of Call: Called patient to make sure no prostate problems on the methocarbamol. None, is taking Tamsulosin. No relief of neck pain and will be traveling this weekend. Is taking Acetaminophen with the Meloxicam. I will add Tramadol to take prn Initial call taken by: patient    New/Updated Medications: TRAMADOL HCL 50 MG TABS (TRAMADOL HCL) Take 1 - 2 tabs every 6 hr as needed Prescriptions: TRAMADOL HCL 50 MG TABS (TRAMADOL HCL) Take 1 - 2 tabs every 6 hr as needed  #40 x 1   Entered and Authorized by:   Zachery Dauer MD   Signed by:   Zachery Dauer MD on 06/09/2010   Method used:   Electronically to        [id number] (retail)       [address]       [place], [place]  [address]       Ph: [phone]       Fax: [phone]   RxID:   [id number]

## 2010-09-15 NOTE — Progress Notes (Signed)
Summary: Refill  Phone Note Call from Patient   Caller: Wife Reason for Call: Refill Medication Summary of Call: is needing refill on caduet 10/20 mg - called pharmacy Friday - needs asap  Initial call taken by: Haydee Salter,  November 20, 2006 10:42 AM   Sent to [facility] 4/8 at 4 PM

## 2010-09-15 NOTE — Assessment & Plan Note (Signed)
Summary: sinus issues-see note/[place]/hale   Vital Signs:  Patient profile:   77 year old male Height:      68 inches Weight:      167 pounds BMI:     25.48 BSA:     1.89 Temp:     98.2 degrees F Pulse rate:   89 / minute BP sitting:   167 / 73  Vitals Entered By: Jone Baseman CMA (Dec 22, 2009 8:33 AM) CC: sinus issues x 2 weeks Is Patient Diabetic? No Pain Assessment Patient in pain? no        Primary Care Provider:  Zachery Dauer MD  CC:  sinus issues x 2 weeks.  History of Present Illness: CC: sinus issues  12 d h/o cold with chest/sinus congestion.  Started as ST, congestion, then cough started.  + SOB and persistent cough, productive.  No fevers/chills.  + nasal congestion and sinus pressure.  No rhinorrhea.  Occasional postnasal drip and itchy eyes.  No h/o allergies, but may be aquiring.  Has tried Mucinex but not helping.  No abd pain, nausea, vomiting, diarrhea, voiding ok.  No CP/tightness.  No h/o asthma/COPD.  No sick contacts at home.    To fly over weekend to [place] to hear opera.  Wants to ensure he does well during flight to enjoy weekend and is able to hear after airplane flight.  Habits & Providers  Alcohol-Tobacco-Diet     Tobacco Status: quit > 6 months     Year Quit: 1975  Current Medications (verified): 1)  Caduet 10-20 Mg Tabs (Amlodipine-Atorvastatin) .... Take 1 Tablet By Mouth At Bedtime 2)  Hydrochlorothiazide 25 Mg Tabs (Hydrochlorothiazide) .... Take One Tablet Daily 3)  Flomax 0.4 Mg  Cp24 (Tamsulosin Hcl) .... Take One Tablet Daily 4)  Adult Aspirin Ec Low Strength 81 Mg  Tbec (Aspirin) .... Take One Tablet Daily 5)  Therapeutic Multivitamin   Tabs (Multiple Vitamin) .... Take One Tablet Daily 6)  Glucosamine-Chondroitin 1500-1200 Mg/57ml  Liqd (Glucosamine-Chondroitin) .... Take Three  Tablest Daily 7)  Vectical 3 Mcg/gm Oint (Calcitriol) .... Apply Two Times A Day Prn 8)  Ibuprofen 600 Mg Tabs (Ibuprofen) .... Take One Tab Three Times A Day  As Needed 9)  Acetaminophen 500 Mg Tabs (Acetaminophen) .... Take Two Tabs Two Times A Day As Needed 10)  Fish Oil   Oil (Fish Oil) .... One Cap Daily  Allergies (verified): No Known Drug Allergies PMH-FH-SH reviewed for relevance  Social History: Smoking Status:  quit > 6 months  Physical Exam  General:  Well-developed,well-nourished,in no acute distress; alert,appropriate and cooperative throughout examination Head:  Normocephalic and atraumatic without obvious abnormalities. Eyes:  No corneal or conjunctival inflammation noted. EOMI. Perrla.  Nose:  External nasal examination shows no deformity or inflammation. Nasal mucosa are pink and moist without lesions or exudates. Mouth:  Oral mucosa and oropharynx without lesions or exudates.  Teeth in good repair. Neck:  No deformities, masses, or tenderness noted.  no LAD Lungs:  Normal respiratory effort, chest expands symmetrically. Lungs are clear to auscultation, no crackles or wheezes. Heart:  Normal rate and regular rhythm. S1 and S2 normal without gallop, murmur, click, rub or other extra sounds. Extremities:  No clubbing, cyanosis, edema, or deformity noted with normal full range of motion of all joints.     Impression & Recommendations:  Problem # 1:  ACUTE BRONCHITIS (ICD-466.0)  Take antibiotics and other medications as directed. Encouraged to push clear liquids, get enough rest, and  take acetaminophen as needed. To be seen in 5-7 days if no improvement, sooner if worse.  afrin nasal spray for nasal congestion prior to flight.  1 wk course of steroids for bronchitis (remote h/o smoking) and cough suppressant for opera.  red flags to return sooner discussed.  If still with cough in 2 wks, call for CXR and consideration of abx course.  His updated medication list for this problem includes:    Hydromet 5-1.5 Mg/25ml Syrp (Hydrocodone-homatropine) .Marland Kitchen... Take one teaspoon q6 hours as needed cough.  100cc  Orders: [facility]- Est Level  3  (99213)  Complete Medication List: 1)  Caduet 10-20 Mg Tabs (Amlodipine-atorvastatin) .... Take 1 tablet by mouth at bedtime 2)  Hydrochlorothiazide 25 Mg Tabs (Hydrochlorothiazide) .... Take one tablet daily 3)  Flomax 0.4 Mg Cp24 (Tamsulosin hcl) .... Take one tablet daily 4)  Adult Aspirin Ec Low Strength 81 Mg Tbec (Aspirin) .... Take one tablet daily 5)  Therapeutic Multivitamin Tabs (Multiple vitamin) .... Take one tablet daily 6)  Glucosamine-chondroitin 1500-1200 Mg/106ml Liqd (Glucosamine-chondroitin) .... Take three  tablest daily 7)  Vectical 3 Mcg/gm Oint (Calcitriol) .... Apply two times a day prn 8)  Ibuprofen 600 Mg Tabs (Ibuprofen) .... Take one tab three times a day as needed 9)  Acetaminophen 500 Mg Tabs (Acetaminophen) .... Take two tabs two times a day as needed 10)  Fish Oil Oil (Fish oil) .... One cap daily 11)  Prednisone 20 Mg Tabs (Prednisone) .... Take two tablets daily for 7 days 12)  Hydromet 5-1.5 Mg/29ml Syrp (Hydrocodone-homatropine) .... Take one teaspoon q6 hours as needed cough.  100cc  Patient Instructions: 1)  Sounds like you have an acute bronchitis after a common cold. 2)  Afrin Nasal spray prior to flight (max 3 days). 3)  Oral course of steroids for 1 week.  cough suppresant syrup as needed. 4)  Call me if cough is not better in 2 weeks, or sooner if worsening, or start to have fevers, chills, nausea, chest pain, etc. and we will obtain chest xray. 5)  Pleasure to meet you today. Prescriptions: HYDROMET 5-1.5 MG/5ML SYRP (HYDROCODONE-HOMATROPINE) take one teaspoon q6 hours as needed cough.  100cc  #1 x 0   Entered and Authorized by:   Eustaquio Boyden  MD   Signed by:   Eustaquio Boyden  MD on 12/22/2009   Method used:   Print then Give to Patient   RxID:   [id number] PREDNISONE 20 MG TABS (PREDNISONE) take two tablets daily for 7 days  #14 x 0   Entered and Authorized by:   Eustaquio Boyden  MD   Signed by:   Eustaquio Boyden  MD on  12/22/2009   Method used:   Electronically to        [facility] (retail)       [address]       [place], [place]  [address]       Ph: [phone] or [phone]       Fax: [phone]   RxID:   [id number]   Appended Document: sinus issues-see note/[place]/hale correction to CPOE - no antibiotics.  main complaint today is cough.  feels sinus pressure/congestion improving on its own.  steroids should further help this out.

## 2010-09-15 NOTE — Progress Notes (Signed)
Summary: Feeling better  Phone Note Outgoing Call   Call placed by: Zachery Dauer MD,  February 09, 2010 3:10 PM Action Taken: Phone Call Completed Summary of Call: I informed her of the increased WBC. She reports her husband began feeling better last PM after taking the Doxycycline and continues better today with less difficulty urinating. He will finish the Doxycycline. We will call to schedule a visit with Dr Raymondo Band in pharmacy clinic to do pulmonary function testing before and after Albuterol. Instructed not to use it before the visit. Office visit with me a couple weeks after that. Initial call taken by: wife, Darl Pikes

## 2010-09-15 NOTE — Progress Notes (Signed)
Summary: [place] request  Phone Note Call from Patient Call back at Home Phone [phone]   Caller: Wife, Darl Pikes Summary of Call: Pts wife is requesting to speak with an RN about what kind of medications pt can take with a cold.  Pt has an enlarged prostate and is unable to take a lot of meds. Initial call taken by: Haydee Salter,  July 03, 2007 9:50 AM  Follow-up for Phone Call        checked with preceptor who reviewed pt's chart. He may take any otc med for cold s/s. called & relayed this to wife. Follow-up by: Golden Circle RN,  July 03, 2007 10:06 AM

## 2010-09-15 NOTE — Assessment & Plan Note (Signed)
Summary: bronchitis per spouse /   Vital Signs:  Patient profile:   77 year old male Height:      68 inches Weight:      166.5 pounds BMI:     25.41 O2 Sat:      94 % Pulse rate:   93 / minute BP sitting:   155 / 77  (left arm)  Vitals Entered By: Jimmy Footman, CMA (February 08, 2010 1:50 PM)  Serial Vital Signs/Assessments:                                PEF    PreRx  PostRx Time      O2 Sat  O2 Type     L/min  L/min  L/min   By 1:53 PM                       200                   Arlyss Repress CMA,                               200                   [address] CMA,                               210                   Thekla Slade CMA,  CC: bronchitis, phlegm Is Patient Diabetic? No Pain Assessment Patient in pain? yes     Location: chest Comments headache, sinus pressure   Primary Care Provider:  Zachery Dauer MD  CC:  bronchitis and phlegm.  History of Present Illness: He's continued some mildly productive cough and decreased hearing since his bronchitis several weeks ago,  but became ill 2 days ago with headache, tender eyeballs, scalp pain,lightheadedness,  myalgias (especially shoulders) fatigue, weakness, dyspnea,  difficulty emptying his bladder, frequency, nocturia, back pain and chills. Has uses the ProAir MDI a total of 3 times.  No burning dysuria, fever, vomiting or diarrhea.  Continues on Prednisone. Has a few pills left.   Not aware of any tick bites or being where he might acquire them.   Habits & Providers  Alcohol-Tobacco-Diet     Tobacco Status: quit  Allergies: No Known Drug Allergies  Physical Exam  General:  Moderately ill and fatigued appearing Eyes:  No corneal or conjunctival inflammation noted. EOMI. Perrla. Funduscopic exam benign, without hemorrhages, exudates or papilledema. Vision grossly normal. Ears:  External ear exam shows no significant lesions or deformities.  Otoscopic examination reveals clear canals, tympanic membranes are intact  bilaterally without bulging, retraction, inflammation or discharge. Hearing is grossly normal bilaterally. Nose:  no external deformity.   Mouth:  Oral mucosa and oropharynx without lesions or exudates.  Teeth in good repair. Lungs:  Normal respiratory effort, chest expands symmetrically. no crackles and no wheezes.   Heart:  Normal rate and regular rhythm. S1 and S2 normal without gallop, murmur, click, rub or other extra sounds. Abdomen:  Bowel sounds positive,abdomen soft and non-tender without masses, organomegaly or hernias noted. Suprapubic surgical scar, old.  Rectal:  No external abnormalities noted. Normal sphincter tone. No rectal masses or  tenderness. Guaiac neg stool.  Prostate:  3+ enlarged.  Non-tender Skin:  Patches of psorriatic rash and areas of atrophy, mainly on legs and back Psych:  subdued.     Impression & Recommendations:  Problem # 1:  MYALGIA (ICD-729.1) Flu-like illness. Consider RMSF so will treat with Doxycycline His updated medication list for this problem includes:    Adult Aspirin Ec Low Strength 81 Mg Tbec (Aspirin) .Marland Kitchen... Take one tablet daily    Ibuprofen 600 Mg Tabs (Ibuprofen) .Marland Kitchen... Take one tab three times a day as needed    Acetaminophen 500 Mg Tabs (Acetaminophen) .Marland Kitchen... Take two tabs two times a day as needed  Orders: CBC w/Diff-[facility] ([address]) Comp Met-[facility] ([address]-09811) Culture, Blood- [facility] ([address]78-29562) [facility]- Est  Level 4 ([address])  Problem # 2:  BRONCHITIS, CHRONIC (ICD-491.9) No lung signs of pneumonia or bronchospasm.  Orders: PeakFlow- [facility] (94150) [facility]- Est  Level 4 (57846)  Problem # 3:  DYSURIA (ICD-788.1) No bladder distension palpable on exam. On full dose Flomax for his BPH. Warned him against antihistamine/ decongestants and symptoms of urinary retention.  His updated medication list for this problem includes:    Doxycycline Hyclate 100 Mg Caps (Doxycycline hyclate) .Marland Kitchen... Take one tab two times a day  Orders: Urinalysis-[facility]  (00000) Urine Culture-[facility] ([address]-28413) [facility]- Est  Level 4 (24401)  Complete Medication List: 1)  Caduet 10-20 Mg Tabs (Amlodipine-atorvastatin) .... Take 1 tablet by mouth at bedtime 2)  Hydrochlorothiazide 25 Mg Tabs (Hydrochlorothiazide) .... Take one tablet daily 3)  Flomax 0.4 Mg Cp24 (Tamsulosin hcl) .... Take two  tablet daily 4)  Adult Aspirin Ec Low Strength 81 Mg Tbec (Aspirin) .... Take one tablet daily 5)  Therapeutic Multivitamin Tabs (Multiple vitamin) .... Take one tablet daily 6)  Glucosamine-chondroitin 1500-1200 Mg/68ml Liqd (Glucosamine-chondroitin) .... Take three  tablest daily 7)  Vectical 3 Mcg/gm Oint (Calcitriol) .... Apply two times a day prn 8)  Ibuprofen 600 Mg Tabs (Ibuprofen) .... Take one tab three times a day as needed 9)  Acetaminophen 500 Mg Tabs (Acetaminophen) .... Take two tabs two times a day as needed 10)  Fish Oil Oil (Fish oil) .... One cap daily 11)  Prednisone 20 Mg Tabs (Prednisone) .... Take two tablets daily for 7 days, then 1 tablet daily for 7 days, then 1/2 tab daily for 7 days, then stop 12)  Proair Hfa 108 (90 Base) Mcg/act Aers (Albuterol sulfate) .... 2 inh q4h as needed shortness of breath 13)  Cheratussin Ac 100-10 Mg/55ml Syrp (Guaifenesin-codeine) .... Take 2 tsp every 4 hours as needed cough 14)  Doxycycline Hyclate 100 Mg Caps (Doxycycline hyclate) .... Take one tab two times a day  Patient Instructions: 1)  Take the Doxycycline 100 mg two times a day  2)  Try to drink fluids  3)  I will call when I have the lab results.  Prescriptions: DOXYCYCLINE HYCLATE 100 MG CAPS (DOXYCYCLINE HYCLATE) Take one tab two times a day  #20 x 0   Entered and Authorized by:   Zachery Dauer MD   Signed by:   Zachery Dauer MD on 02/08/2010   Method used:   Electronically to        [facility] (retail)       [address]       [place], [place]  [address]       Ph: [phone] or [phone]       Fax: [phone]  RxID:   [id number]   Laboratory Results   Urine Tests  Date/Time Received: February 08, 2010 2:22 PM  Date/Time Reported: February 08, 2010 3:31 PM   Routine Urinalysis   Color: yellow Appearance: slightly Cloudy Glucose: negative   (Normal Range: Negative) Bilirubin: negative   (Normal Range: Negative) Ketone: negative   (Normal Range: Negative) Spec. Gravity: 1.020   (Normal Range: 1.003-1.035) Blood: negative   (Normal Range: Negative) pH: 8.5   (Normal Range: 5.0-8.0) Protein: 30   (Normal Range: Negative) Urobilinogen: 0.2   (Normal Range: 0-1) Nitrite: negative   (Normal Range: Negative) Leukocyte Esterace: negative   (Normal Range: Negative)  Urine Microscopic WBC/HPF: occ RBC/HPF: 1-5 Bacteria/HPF: 1+ Epithelial/HPF: 1-5 Other: 3+ amorphous    Comments: ...............test performed by......Marland KitchenBonnie A. [place], MLS (ASCP)cm

## 2010-09-15 NOTE — Letter (Signed)
Summary: Results Follow-up Letter  [facility]  [address]   [place], [place] [address]   Phone: [phone]  Fax: [phone]    12/25/2008  [address] [place], [place]  [address]  Dear Dr. Ardell Isaacs,   The following are the results of your recent test(s): Patient: Jeffrey Meadows  Tests: (1) Basic Metabolic Panel [phone])   Sodium                    141 mEq/L                   135-145   Potassium                 3.9 mEq/L                   3.5-5.3   Chloride                  103 mEq/L                   96-112   CO2                       25 mEq/L                    19-32   Glucose              [H]  101 mg/dL                   40-10   BUN                       12 mg/dL                    2-72   Creatinine                0.89 mg/dL                  0.40-1.50   Calcium                   10.1 mg/dL                  5.3-66.4  Tests: (2) Vitamin D (25-Hydroxy) ([address]347)  Vitamin D (25-Hydroxy)                             36 ng/mL                    30-89     This assay accurately quantifies Vitamin D, which is the sum of the     25-Hydroxy forms of Vitamin D2 and D3.  Studies have shown that the     optimum concentration of 25-Hydroxy Vitamin D is 30 ng/mL or higher.      Concentrations of Vitamin D between 20 and 29 ng/mL are consideredto     be insufficient and concentrations less than 20 ng/mL are considered     to be deficient for Vitamin D.  Nickie, your results are good. Please continue your medications the same.  Document Creation Date: 12/23/2008 3:53 PM _______________________________________________________________________  Sincerely,  Zachery Dauer MD [facility]

## 2010-09-15 NOTE — Progress Notes (Signed)
----   Converted from flag ---- ---- 05/22/2008 8:30 AM, De Nurse wrote: Spoke with wife and we sched. the lab work, but [place] will be out of the country for the rest of this month and she will call Monday to schedule for 1st of Nov. with you.  Lab is sched.for Nov 4th.  ---- 05/21/2008 6:53 PM, Zachery Dauer MD wrote: Please call Dr Ardell Isaacs to schedule his hypertension and cholesterol follow-up. Let me know if he would be able to come in beforehand to do labs that we could discuss at the subsequent visit. ------------------------------

## 2010-09-15 NOTE — Letter (Signed)
Summary: Results Follow-up Letter  [facility]  [address]   [place], [place] [address]   Phone: [phone]  Fax: [phone]    11/30/2009  [address] [place], [place]  [address]  Dear Dr. Ardell Isaacs,   The following are the results of your recent test(s): Patient: Jeffrey Meadows Good results. Your lab results are all normal  Tests: (1) CBC NO Diff (Complete Blood Count) (10000)   Order Note: FASTING   WBC                       6.9 K/uL                    4.0-10.5   RBC                       4.48 MIL/uL                 4.22-5.81   Hemoglobin                14.3 g/dL                   57.8-46.9   Hematocrit                42.5 %                      39.0-52.0   MCV                       94.9 fL                     78.0-100.0 ! MCH                       31.9 pg                     26.0-34.0   MCHC                      33.6 g/dL                   62.9-52.8   RDW                       13.2 %                      11.5-15.5   Platelet Count            219 K/uL                    150-400  Tests: (2) Comprehensive Metabolic Panel (41324)   Sodium                    141 mEq/L                   135-145   Potassium                 3.8 mEq/L                   3.5-5.3   Chloride                  103 mEq/L  96-112   CO2                       26 mEq/L                    19-32   Glucose                   97 mg/dL                    47-82   BUN                       13 mg/dL                    9-56   Creatinine                0.82 mg/dL                  0.40-1.50   Bilirubin, Total          0.5 mg/dL                   2.1-3.0   Alkaline Phosphatase      56 U/L                      39-117   AST/SGOT                  15 U/L                      0-37   ALT/SGPT                  16 U/L                      0-53   Total Protein             6.6 g/dL                    8.6-5.7   Albumin                   4.6 g/dL                    8.4-6.9   Calcium                    9.3 mg/dL                   6.2-95.2  Tests: (3) Lipid Profile (84132)   Cholesterol               152 mg/dL                   4-401     ATP III Classification:           < 200        mg/dL        Desirable          200 - 239     mg/dL        Borderline High          >= 240        mg/dL        High         Triglyceride  83 mg/dL                    <161   HDL Cholesterol           59 mg/dL                    >09   Total Chol/HDL Ratio      2.6 Ratio  VLDL Cholesterol (Calc)                             17 mg/dL                    6-04  LDL Cholesterol (Calc)                             76 mg/dL                    5-40           Total Cholesterol/HDL Ratio:CHD Risk                            Coronary Heart Disease Risk Table                                            Men       Women              1/2 Average Risk              3.4        3.3                  Average Risk              5.0        4.4              2 X Average Risk              9.6        7.1              3 X Average Risk             23.4       11.0     Use the calculated Patient Ratio above and the CHD Risk table      to determine the patient's CHD Risk.     ATP III Classification (LDL):           < 100        mg/dL         Optimal  Document Creation Date: 11/25/2009 11:56 PM _________________________________________________________ We enjoyed seeing your narration on the [employer] show on [place].   Sincerely,  Zachery Dauer MD [facility]           Appended Document: Results Follow-up Letter mailed.

## 2010-09-15 NOTE — Progress Notes (Signed)
Summary: triage  Phone Note Call from Patient Call back at Home Phone [phone]   Caller: Patient Summary of Call: pt thinks that his symptoms has come back and wants to know if something could be called in instead of coming in Initial call taken by: De Nurse,  Jan 11, 2010 9:07 AM  Follow-up for Phone Call        states his symptoms are getting worse. does not want to come in due to long waits. offered double book with Dr. Sheffield Slider or 1:30 with Dr. Earlene Plater. he would be her first after lunch. he chose Dr. Earlene Plater Follow-up by: Golden Circle RN,  Jan 11, 2010 9:33 AM

## 2010-09-15 NOTE — Progress Notes (Signed)
Summary: refilled HCTZ/TS  Phone Note Refill Request Call back at [phone]   Refills Requested: Medication #1:  HYDROCHLOROTHIAZIDE 25 MG TABS Take one tablet daily Mr. Oguinn need refill sent to [facility] on [address] [phone] because leaving town on [place] and no longer for pharmacy.  Have 3 tabs left.  Please let him know when sent  Initial call taken by: Abundio Miu,  June 13, 2010 2:35 PM  Follow-up for Phone Call        called pt. rx refilled. Follow-up by: Arlyss Repress CMA,,  June 13, 2010 3:14 PM

## 2010-09-15 NOTE — Miscellaneous (Signed)
Summary: Directives and Consents  Directives and Consents   Imported By: Denny Peon LEVAN 04/02/2007 12:09:11  _____________________________________________________________________  External Attachment:    Type:   Image     Comment:   External Document

## 2010-09-15 NOTE — Miscellaneous (Signed)
  Clinical Lists Changes  Medications: Changed medication from HYDROCHLOROTHIAZIDE 25 MG TABS (HYDROCHLOROTHIAZIDE) to HYDROCHLOROTHIAZIDE 25 MG TABS (HYDROCHLOROTHIAZIDE) Take one tablet daily - Signed Changed medication from ADULT ASPIRIN EC LOW STRENGTH 81 MG  TBEC (ASPIRIN) t1td to ADULT ASPIRIN EC LOW STRENGTH 81 MG  TBEC (ASPIRIN) Take one tablet daily - Signed Rx of HYDROCHLOROTHIAZIDE 25 MG TABS (HYDROCHLOROTHIAZIDE) Take one tablet daily;  #30 x 12;  Signed;  Entered by: Zachery Dauer MD;  Authorized by: Zachery Dauer MD;  Method used: Electronic Rx of ADULT ASPIRIN EC LOW STRENGTH 81 MG  TBEC (ASPIRIN) Take one tablet daily;  #100 x 12;  Signed;  Entered by: Zachery Dauer MD;  Authorized by: Zachery Dauer MD;  Method used: Electronic    Prescriptions: ADULT ASPIRIN EC LOW STRENGTH 81 MG  TBEC (ASPIRIN) Take one tablet daily  #100 x 12   Entered and Authorized by:   Zachery Dauer MD   Signed by:   Zachery Dauer MD on 03/18/2007   Method used:   Electronically sent to ...       [facility]       [address]       [place], [place]  [address]       Ph: [phone] or [phone]       Fax: [phone]   RxID:   [id number] HYDROCHLOROTHIAZIDE 25 MG TABS (HYDROCHLOROTHIAZIDE) Take one tablet daily  #30 x 12   Entered and Authorized by:   Zachery Dauer MD   Signed by:   Zachery Dauer MD on 03/18/2007   Method used:   Electronically sent to ...       [facility]       [address]       [place], [place]  [address]       Ph: [phone] or [phone]       Fax: [phone]   RxID:   [id number]

## 2010-09-15 NOTE — Assessment & Plan Note (Signed)
Summary: PFT's - Rx Clinic   Vital Signs:  Patient profile:   77 year old male Height:      68 inches Weight:      168 pounds BMI:     25.64 Pulse rate:   70 / minute BP sitting:   149 / 86  (left arm)  Primary Care Provider:  Zachery Dauer MD   History of Present Illness: Patient reports increased exacerbations of bronchits and cough, with multiple exacerbations in the past year which required prednisone bursts.  Habits & Providers  Alcohol-Tobacco-Diet     Tobacco Status: previous     Cigarette Packs/Day: 2     Year Started: 1950     Year Quit: 1980     Pack years: 41  Current Medications (verified): 1)  Caduet 10-20 Mg Tabs (Amlodipine-Atorvastatin) .... Take 1 Tablet By Mouth Daily 2)  Hydrochlorothiazide 25 Mg Tabs (Hydrochlorothiazide) .... Take One Tablet Daily 3)  Therapeutic Multivitamin   Tabs (Multiple Vitamin) .... Take One Tablet Daily 4)  Glucosamine-Chondroitin 1500-1200 Mg/57ml  Liqd (Glucosamine-Chondroitin) .... Take Three  Tablest Daily 5)  Vectical 3 Mcg/gm Oint (Calcitriol) .... Apply Two Times A Day Prn 6)  Fish Oil   Oil (Fish Oil) .... One Cap Daily 7)  Proair Hfa 108 (90 Base) Mcg/act  Aers (Albuterol Sulfate) .... 2 Inh Q4h As Needed Shortness of Breath 8)  Tamsulosin Hcl 0.4 Mg Caps (Tamsulosin Hcl) .... One At Bedtime 9)  Aspirin 325 Mg  Tabs (Aspirin) .... Once Daily  Allergies (verified): No Known Drug Allergies  Social History: Smoking Status:  previous Packs/Day:  2   Impression & Recommendations:  Problem # 1:  BRONCHITIS, CHRONIC (ICD-491.9) Assessment Unchanged  Spirometry evaluation with Pre and Post Bronchodilator reveals:   severe obstruction.  Pt has been experiencing: cough, increased episodes of bronchitis for: past 3 years and taking:ventolin prn but has not used in a long time, and no longer knows the location of the inhaler. Patient is able to exercise on stationary bike for 30 minutes without problems.Reviewed results of  pulmonary function tests.  Pt verbalized understanding of results.Initiated inhaled steroid:advair 250/50 to be taken; one inhalation twice daily. Counseled on purpose, proper use, and potential adverse effects, including thrush.  Written patient instructions provided.    F/U Rx Clinic Visit: 2-3 months.          TTFFC:  45   mins.  Patient seen with:Monica Kendall Flack.D., Kennieth Francois, Lloyd Huger, MD  Orders: PFT Baseline-Pre/Post Bronchodiolator (PFT Baseline-Pre/Pos)  Complete Medication List: 1)  Caduet 10-20 Mg Tabs (Amlodipine-atorvastatin) .... Take 1 tablet by mouth daily 2)  Hydrochlorothiazide 25 Mg Tabs (Hydrochlorothiazide) .... Take one tablet daily 3)  Therapeutic Multivitamin Tabs (Multiple vitamin) .... Take one tablet daily 4)  Glucosamine-chondroitin 1500-1200 Mg/54ml Liqd (Glucosamine-chondroitin) .... Take three  tablest daily 5)  Vectical 3 Mcg/gm Oint (Calcitriol) .... Apply two times a day prn 6)  Fish Oil Oil (Fish oil) .... One cap daily 7)  Proair Hfa 108 (90 Base) Mcg/act Aers (Albuterol sulfate) .... 2 inh q4h as needed shortness of breath 8)  Tamsulosin Hcl 0.4 Mg Caps (Tamsulosin hcl) .... One at bedtime 9)  Aspirin 325 Mg Tabs (Aspirin) .... Once daily 10)  Advair Diskus 250-50 Mcg/dose Aepb (Fluticasone-salmeterol) .... One inhalation  Other Orders: Albuterol Sulfate Sol 1mg  unit dose (E4540)  Patient Instructions: 1)  Lung function test today showed severe obstruction. 2)  Start ADVAIR inhaler twice daily.  3)  Follow-UP  with Dr. Sheffield Slider in 2-3 months.  Prescriptions: ADVAIR DISKUS 250-50 MCG/DOSE AEPB (FLUTICASONE-SALMETEROL) one inhalation  #1 x 11   Entered by:   Christian Mate D   Authorized by:   Zachery Dauer MD   Signed by:   Madelon Lips Pharm D on 03/18/2010   Method used:   Electronically to        [facility]* (retail)       [address]       [place], [place]  [address]       Ph: [phone] or  [phone]       Fax: [phone]   RxID:   [id number] ASPIRIN 325 MG  TABS (ASPIRIN) once daily  #30 x 0   Entered and Authorized by:   Christian Mate D   Signed by:   Madelon Lips Pharm D on 03/18/2010   Method used:   Historical   RxID:   [id number] TAMSULOSIN HCL 0.4 MG CAPS (TAMSULOSIN HCL) one at bedtime  #30 x 0   Entered and Authorized by:   Christian Mate D   Signed by:   Madelon Lips Pharm D on 03/18/2010   Method used:   Historical   RxID:   [id number] CADUET 10-20 MG TABS (AMLODIPINE-ATORVASTATIN) Take 1 tablet by mouth daily  #1 x 0   Entered and Authorized by:   Christian Mate D   Signed by:   Madelon Lips Pharm D on 03/18/2010   Method used:   Historical   RxID:   [id number]    Pulmonary Function Test Date: 03/18/2010 Height (in.): 68 Gender: Male  Pre-Spirometry FVC    Value: 3.34 L/min   Pred: 3.58 L/min     % Pred: 93 % FEV1    Value: 0.94 L     Pred: 2.80 L     % Pred: 33 % FEV1/FVC  Value: 28 %     Pred: 79 %     % Pred: 35 % FEF 25-75  Value: 0.19 L/min   Pred: 2.78 L/min     % Pred: 7 %  Post-Spirometry FVC    Value: 3.83 L/min   % Pred: 106 % FEV1    Value: 1.03 L     % Pred: 36 % FEV1/FVC  Value: 27 %     % Pred: 34 % FEF 25-75  Value: 0.25 L/min   % Pred: 9 %  Comments: effort; excellent.   Evaluation: severe obstruction with significant bronchodilator response  Recommendations: FVC increased 13%  no change FEV1 post albuterol treatment  start advair    Tobacco Counseling   Previously used tobacco.    Cigarettes      Year started smoking cigarettes:      1950     Number of years smoked:        30     Average PPD smoked:          2     Year quit smoking cigarettes:        1980     Number of years quit smoking cigarettes:    31     Average number of packs smoked per year:    730     Pack Years:            59  Counseled to avoid passive smoke exposure.  Cessation Stage:     Maintenance Quit Confidence:  not worried about restarting smoking at all  Quit Method Used:      -  cold [place]    Medication Administration  Medication # 1:    Medication: Albuterol Sulfate Sol 1mg  unit dose    Diagnosis: EMPHYSEMA (ICD-492.8)    Dose: 2.5mg     Route: inhaled    Exp Date: 09/2011    Lot #: [id number]    Mfr: nephron    Comments: 2.5mg /78ml given    Patient tolerated medication without complications  Orders Added: 1)  PFT Baseline-Pre/Post Bronchodiolator [PFT Baseline-Pre/Pos] 2)  Albuterol Sulfate Sol 1mg  unit dose [N5621]

## 2010-09-15 NOTE — Progress Notes (Signed)
  Phone Note Call from Patient   Caller: Spouse Call For: [phone] Summary of Call: Jeffrey Meadows want to know about discontinuing Eusevio on [place].  He thinks his bp & cholesterol is fine to stop, but would like physician's view to sign off. Please call asap.  Lv long msg. Will be out of town for a few days.  Thank you sooo much Initial call taken by: Britta Mccreedy mcgregor  Follow-up for Phone Call        She wants to encourage him to take the medicine. I informed her that his blood pressure hasn't been fully controlled and the cholesterol would likely go up off the medicine. I recommended going to the separate generic medications after Lipitor goes genteric in Nov. She will try to get him to take the medication and to make an appointment for follow-up.  Follow-up by: Zachery Dauer MD,  May 08, 2010 6:40 AM      Prevention & Chronic Care Immunizations   Influenza vaccine: Fluvax MCR  (06/01/2009)   Influenza vaccine due: 05/13/2009    Tetanus booster: 02/11/2006: Done.   Tetanus booster due: 02/12/2016    Pneumococcal vaccine: Done.  (11/13/1999)   Pneumococcal vaccine due: None    H. zoster vaccine: 09/20/2006: given  Colorectal Screening   Hemoccult: Done.  (12/13/1999)   Hemoccult due: 12/12/2000    Colonoscopy: Results: Normal. Results: Hemorrhoids.     Location:  [facility].     (10/12/2009)   Colonoscopy action/deferral: Repeat colonoscopy in 5 years.   (10/12/2009)   Colonoscopy due: 08/15/2011  Other Screening   PSA: 4.15  (06/17/2008)   PSA due due: 06/17/2009   Smoking status: previous  (03/18/2010)  Lipids   Total Cholesterol: 152  (11/25/2009)   LDL: 76  (11/25/2009)   LDL Direct: Not documented   HDL: 59  (11/25/2009)   Triglycerides: 83  (11/25/2009)    SGOT (AST): 13  (02/08/2010)   SGPT (ALT): 13  (02/08/2010)   Alkaline phosphatase: 55  (02/08/2010)   Total bilirubin: 1.1  (02/08/2010)  Hypertension   Last Blood Pressure: 149 / 86   (03/18/2010)   Serum creatinine: 0.84  (02/08/2010)   Serum potassium 3.5  (02/08/2010)  Self-Management Support :   Personal Goals (by the next clinic visit) :      Personal blood pressure goal: 140/90  (11/25/2009)     Personal LDL goal: 130  (11/25/2009)    Hypertension self-management support: Education handout, Written self-care plan  (11/25/2009)    Lipid self-management support: Not documented

## 2010-09-15 NOTE — Assessment & Plan Note (Signed)
Summary: flu wp  Nurse Visit    Prior Medications: CADUET 10-20 MG TABS (AMLODIPINE-ATORVASTATIN) Take 1 tablet by mouth at bedtime HYDROCHLOROTHIAZIDE 25 MG TABS (HYDROCHLOROTHIAZIDE) Take one tablet daily FLOMAX 0.4 MG  CP24 (TAMSULOSIN HCL) Take one tablet daily ADULT ASPIRIN EC LOW STRENGTH 81 MG  TBEC (ASPIRIN) Take one tablet daily THERAPEUTIC MULTIVITAMIN   TABS (MULTIPLE VITAMIN) Take one tablet daily GLUCOSAMINE-CHONDROITIN 1500-1200 MG/30ML  LIQD (GLUCOSAMINE-CHONDROITIN) Take three  tablest daily DOVONEX 0.005 %  CREA (CALCIPOTRIENE) Apply three times daily Current Allergies: No known allergies    Influenza Vaccine    Vaccine Type: Fluvax Non-MCR    Site: left deltoid    Mfr: GlaxoSmithKline    Dose: 0.5 ml    Route: IM    Given by: Rachael Fee RN    Exp. Date: 02/10/2009    Lot #: [id number]    VIS given: 03/07/07 version given May 15, 2008.  Flu Vaccine Consent Questions    Do you have a history of severe allergic reactions to this vaccine? no    Any prior history of allergic reactions to egg and/or gelatin? no    Do you have a sensitivity to the preservative Thimersol? no    Do you have a past history of Guillan-Barre Syndrome? no    Do you currently have an acute febrile illness? no    Have you ever had a severe reaction to latex? no    Vaccine information given and explained to patient? yes   Orders Added: 1)  Influenza Vaccine NON MCR [00028] 2)  Est Level 1- [facility] [78295]    ]

## 2010-09-15 NOTE — Letter (Signed)
Summary: Results Follow-up Letter  [facility]  [address]   [place], [place] [address]   Phone: [phone]  Fax: [phone]    04/24/2007  [address] [place], [place]  [address]  Dear [name],   The following are the results of your recent test(s): They continue to be stable so continue your medicines the same  Patient: Jeffrey Meadows Note: All result statuses are Final unless otherwise noted.  Tests: (1) CBC (Complete Blood Count) (10000)   Order Note: FASTING   WBC                       7.5 K/uL                    4.0-10.5   RBC                       4.92 MIL/uL                 4.22-5.81   Hemoglobin                15.6 g/dL                   57.8-46.9   Hematocrit                45.7 %                      39.0-52.0   MCV                       92.9 fL                     78.0-100.0   MCHC                      34.1 g/dL                   62.9-52.8   RDW                       12.7 %                      11.5-14.0   Platelet Count            252 K/uL                    150-400  Tests: (2) Comprehensive Metabolic Panel (41324)   Sodium                    141 mEq/L                   135-145   Potassium                 3.7 mEq/L                   3.5-5.3   Chloride                  103 mEq/L                   96-112   CO2                       26 mEq/L  19-32   Glucose              [H]  100 mg/dL                   66-06   BUN                       9 mg/dL                     3-01   Creatinine                0.82 mg/dL                  0.40-1.50   Bilirubin, Total          0.9 mg/dL                   6.0-1.0   Alkaline Phosphatase      68 U/L                      39-117   AST/SGOT                  17 U/L                      0-37   ALT/SGPT                  20 U/L                      0-53   Total Protein             7.0 g/dL                    9.3-2.3   Albumin                   4.5 g/dL                    5.5-7.3  Calcium                   9.9 mg/dL                   2.2-02.5  Tests: (3) Lipid Profile (42706)   Cholesterol               155 mg/dL                   2-376     ATP III Classification:           < 200        mg/dL        Desirable          200 - 239     mg/dL        Borderline High          >= 240        mg/dL        High         Triglyceride              129 mg/dL                   <283   HDL Cholesterol           58 mg/dL                    >  39   Total Chol/HDL Ratio      2.7 Ratio  VLDL Cholesterol (Calc)                             26 mg/dL                    1-61  LDL Cholesterol (Calc)                             71 mg/dL                    0-96           Total Cholesterol/HDL Ratio:CHD Risk                            Coronary Heart Disease Risk Table                                            Men       Women              1/2 Average Risk              3.4        3.3                  Average Risk              5.0        4.4              2 X Average Risk              9.6        7.1              3 X Average Risk             23.4       11.0     Use the calculated Patient Ratio above and the CHD Risk table      to determine the patient's CHD Risk.     ATP III Classification (LDL):           < 100        mg/dL         Optimal          100 - 129     mg/dL         Near or Above Optimal          130 - 159     mg/dL         Borderline High          160 - 189     mg/dL         High           > 190        mg/dL         Very High        Tests: (4) PSA (04540)   PSA                  [H]  4.40 ng/mL  0.10-4.00     Result repeated and verified.     Test Methodology: [employer] PSA  Note: An exclamation mark (!) indicates a result that was not dispersed into the flowsheet. Document Creation Date: 04/24/2007 2:51 AM  Sincerely,  Zachery Dauer MD [facility]            Appended Document: Results Follow-up Letter Please send to Dr  Ardell Isaacs  Appended Document: Results Follow-up Letter Letter mailed to pt. ..................................................................Marland KitchenAltamese Dilling, CMA

## 2010-09-15 NOTE — Progress Notes (Signed)
Summary: triage  Phone Note Call from Patient Call back at Home Phone [phone]   Caller: Spouse-Susan Summary of Call: Would like to talk about his reacurring bronchitis. Initial call taken by: Clydell Hakim,  February 08, 2010 9:01 AM  Follow-up for Phone Call        spoke with spouse. she states he has bronchitis again & wants a referral to a specialist. appt made with pcp to discuss Follow-up by: Golden Circle RN,  February 08, 2010 9:24 AM

## 2010-09-15 NOTE — Assessment & Plan Note (Signed)
Summary: complete physical wp   Vital Signs:  Patient Profile:   77 Years Old Male Height:     68 inches Weight:      165 pounds BMI:     25.18 Pulse rate:   80 / minute BP sitting:   118 / 64  (right arm)  Pt. in pain?   no  Vitals Entered By: Arlyss Repress CMA, (June 23, 2008 1:37 PM)              Is Patient Diabetic? No     PCP:  Zachery Dauer MD  Chief Complaint:  physical.  History of Present Illness: Returned from [place] with a URI which improved after a week but left with post nasal and productive cough. No wheeze, mild dyspnea on exertion voice remains.   On tour for a couple new books and traveling, and not sleeping as well. Night time aspirin.   Darl Pikes had some bad neck pain and was treated with Motrin for arthritis.  He tried it for hip pain and could walk for 3 times the distance, ie 3.5 miles.   Lipid Management History:      Positive NCEP/ATP III risk factors include male age 3 years old or older and hypertension.  Negative NCEP/ATP III risk factors include non-diabetic, non-tobacco-user status, no ASHD (atherosclerotic heart disease), no prior stroke/TIA, no peripheral vascular disease, and no history of aortic aneurysm.       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 Somewhat head  forward position Lungs:     Normal respiratory effort, chest expands symmetrically. Lungs are clear to auscultation, no crackles or wheezes. Heart:     Normal rate and regular rhythm. S1 and S2 normal without gallop, murmur, click, rub or other extra sounds. Abdomen:     Bowel sounds positive,abdomen soft and non-tender without masses, organomegaly or hernias noted. Suprapubic surgical scar, old.  Skin:     Patches of psorriatic rash and areas of atrophy, mainly on legs Psych:     Oriented X3, good eye contact, not anxious appearing, and not depressed appearing, but seems tired     Complete Medication List: 1)  Caduet 10-20 Mg Tabs (Amlodipine-atorvastatin) .... Take 1 tablet by mouth at bedtime 2)  Hydrochlorothiazide 25 Mg Tabs (Hydrochlorothiazide) .... Take one tablet daily 3)  Flomax 0.4 Mg Cp24 (Tamsulosin hcl) .... Take one tablet daily 4)  Adult Aspirin Ec Low Strength 81 Mg Tbec (Aspirin) .... Take one tablet daily 5)  Therapeutic Multivitamin Tabs (Multiple vitamin) .... Take one tablet daily 6)  Glucosamine-chondroitin 1500-1200 Mg/88ml Liqd (Glucosamine-chondroitin) .... Take three  tablest daily 7)  Dovonex 0.005 % Crea (Calcipotriene) .... Apply three times daily 8)  Ibuprofen 600 Mg Tabs (Ibuprofen) .... Take one tab three times a day 9)  Acetaminophen 500 Mg Tabs (Acetaminophen) .... Take two tabs two times a day  Lipid Assessment/Plan:      Based on NCEP/ATP III, the patient's risk factor category is "2 or more risk factors and a calculated 10 year CAD risk of < 20%".  From this information, the patient's calculated lipid goals are as follows: Total cholesterol goal is 200; LDL cholesterol goal is 130; HDL cholesterol goal is 40; Triglyceride goal is 150.  His LDL cholesterol goal has been met.     Patient Instructions: 1)  Please schedule a follow-up appointment in 6 months. 2)  Take Acetaminophen 500 mg 2 tabs two times a day.  Use Ibuprofen 600 mg three times a day  with food for a few days when you have a flare of arthritis.  3)  limit alcohol to 2 drinks daily   Prescriptions: IBUPROFEN 600 MG TABS (IBUPROFEN) Take one tab three times a day  #60 x 3   Entered and Authorized by:   Zachery Dauer MD   Signed by:   Zachery Dauer MD on 06/23/2008   Method used:   Electronically to        [facility] (retail)       [address]       [place], [place]  [address]       Ph: [phone] or [phone]       Fax: [phone]   RxID:   [id number]  ]

## 2010-09-15 NOTE — Progress Notes (Signed)
Summary: phn msg joint pains  Phone Note Call from Patient Call back at Home Phone [phone]   Caller: spouse-Susan Summary of Call: thinks that his Caduet is causing muscle pain to the point of him not being able to stand or get up very good.  wants to talk to doctor about this Initial call taken by: De Nurse,  April 01, 2010 3:52 PM  Follow-up for Phone Call        called pt and lmvm to call back. Follow-up by: Arlyss Repress CMA,,  April 01, 2010 4:10 PM  Additional Follow-up for Phone Call Additional follow up Details #1::        Returned call.  Is at home now. Additional Follow-up by: Clydell Hakim,  April 01, 2010 4:26 PM    Additional Follow-up for Phone Call Additional follow up Details #2::    He's really been having pain in the left hip. Takes aspirin for it. Active on the exercycle.  Recommended Acetaminophen regularly and Ibuprofen for flares. Call if he develops myalgias. Vary exercises Follow-up by: Zachery Dauer MD,  April 04, 2010 11:31 AM

## 2010-09-15 NOTE — Assessment & Plan Note (Signed)
Summary: getting worse again/Leslie/Hale   Vital Signs:  Patient profile:   77 year old male Height:      68 inches Weight:      167 pounds BMI:     25.48 O2 Sat:      96 % on Room air Temp:     97.8 degrees F oral Pulse rate:   89 / minute BP sitting:   148 / 77  (left arm) Cuff size:   regular  Vitals Entered By: Tessie Fass CMA (Jan 11, 2010 1:34 PM)  O2 Flow:  Room air  Serial Vital Signs/Assessments:                                PEF    PreRx  PostRx Time      O2 Sat  O2 Type     L/min  L/min  L/min   By                               150                   Robert Busick CMA 2:25 PM                       180                   Molly Maduro Busick CMA                               170                   Robert Busick CMA  CC: cold symptoms x 4 days Is Patient Diabetic? No Pain Assessment Patient in pain? no        Primary Care Provider:  Zachery Dauer MD  CC:  cold symptoms x 4 days.  History of Present Illness: 77 year old M:  1. Congestion: 4 days Hx of cough, chest and head congestion, mild sore throat, wheeze, fatigue, +/- fever/chills. Denies CP, N/V/D, LE edema, rash. No rhinorrhea.  Occasional postnasal drip and itchy eyes. No h/o allergies, but may be aquiring.  Has tried Mucinex but not helping. No h/o asthma.  No sick contacts at home.  + 45 pack year Hx of smoking, but quit > 20 years ago.  He was seen  ~ 2 weeks ago for the same and Tx with steroid burst. He states that he got better for a while, but now feels worse.  Habits & Providers  Alcohol-Tobacco-Diet     Tobacco Status: quit  Current Medications (verified): 1)  Caduet 10-20 Mg Tabs (Amlodipine-Atorvastatin) .... Take 1 Tablet By Mouth At Bedtime 2)  Hydrochlorothiazide 25 Mg Tabs (Hydrochlorothiazide) .... Take One Tablet Daily 3)  Flomax 0.4 Mg  Cp24 (Tamsulosin Hcl) .... Take One Tablet Daily 4)  Adult Aspirin Ec Low Strength 81 Mg  Tbec (Aspirin) .... Take One Tablet Daily 5)  Therapeutic  Multivitamin   Tabs (Multiple Vitamin) .... Take One Tablet Daily 6)  Glucosamine-Chondroitin 1500-1200 Mg/57ml  Liqd (Glucosamine-Chondroitin) .... Take Three  Tablest Daily 7)  Vectical 3 Mcg/gm Oint (Calcitriol) .... Apply Two Times A Day Prn 8)  Ibuprofen 600 Mg Tabs (Ibuprofen) .... Take One Tab Three Times A Day As Needed 9)  Acetaminophen 500 Mg Tabs (Acetaminophen) .... Take Two Tabs Two Times A Day As Needed 10)  Fish Oil   Oil (Fish Oil) .... One Cap Daily 11)  Prednisone 20 Mg Tabs (Prednisone) .... Take Two Tablets Daily For 7 Days, Then 1 Tablet Daily For 7 Days, Then 1/2 Tab Daily For 7 Days, Then Stop 12)  Hydromet 5-1.5 Mg/72ml Syrp (Hydrocodone-Homatropine) .... Take One Teaspoon Q6 Hours As Needed Cough.  100cc 13)  Proair Hfa 108 (90 Base) Mcg/act  Aers (Albuterol Sulfate) .... 2 Inh Q4h As Needed Shortness of Breath 14)  Azithromycin 250 Mg  Tabs (Azithromycin) .... 2 By  Mouth Today and Then 1 Daily For 4 Days  Allergies (verified): No Known Drug Allergies PMH-FH-SH reviewed-no changes except otherwise noted  Social History: Smoking Status:  quit  Review of Systems      See HPI  Physical Exam  General:  Well-developed, well-nourished, in no acute distress; alert,appropriate and cooperative throughout examination. Vitals reviewed. Head:  Normocephalic and atraumatic without obvious abnormalities.  Eyes:  No corneal or conjunctival inflammation noted. EOMI.  Mouth:  MMM. Lungs:  Normal respiratory effort, chest expands symmetrically. + Wheeze. + Rhochi. Heart:  Normal rate and regular rhythm. S1 and S2 normal without gallop, murmur, click, rub or other extra sounds. Pulses:  R and L dorsalis pedis and posterior tibial pulses are full and equal bilaterally. Extremities:  No edema. Psych:  Oriented X3, good eye contact, not anxious appearing, and not depressed appearing,    Impression & Recommendations:  Problem # 1:  BRONCHITIS, CHRONIC (ICD-491.9) Assessment  New Normal pulse ox today. Will Rx Azithromycin, Albuterol, and Prednisone with taper and instructions to see PCP before finishing. Peak low today and when improved. CXR to evaluate for COPD changes/PNA. No red flags today.   Orders: CXR- 2view (CXR) Pulse Oximetry- [facility] (94760) [facility]- Est Level  3 (29476)  Complete Medication List: 1)  Caduet 10-20 Mg Tabs (Amlodipine-atorvastatin) .... Take 1 tablet by mouth at bedtime 2)  Hydrochlorothiazide 25 Mg Tabs (Hydrochlorothiazide) .... Take one tablet daily 3)  Flomax 0.4 Mg Cp24 (Tamsulosin hcl) .... Take one tablet daily 4)  Adult Aspirin Ec Low Strength 81 Mg Tbec (Aspirin) .... Take one tablet daily 5)  Therapeutic Multivitamin Tabs (Multiple vitamin) .... Take one tablet daily 6)  Glucosamine-chondroitin 1500-1200 Mg/67ml Liqd (Glucosamine-chondroitin) .... Take three  tablest daily 7)  Vectical 3 Mcg/gm Oint (Calcitriol) .... Apply two times a day prn 8)  Ibuprofen 600 Mg Tabs (Ibuprofen) .... Take one tab three times a day as needed 9)  Acetaminophen 500 Mg Tabs (Acetaminophen) .... Take two tabs two times a day as needed 10)  Fish Oil Oil (Fish oil) .... One cap daily 11)  Prednisone 20 Mg Tabs (Prednisone) .... Take two tablets daily for 7 days, then 1 tablet daily for 7 days, then 1/2 tab daily for 7 days, then stop 12)  Hydromet 5-1.5 Mg/89ml Syrp (Hydrocodone-homatropine) .... Take one teaspoon q6 hours as needed cough.  100cc 13)  Proair Hfa 108 (90 Base) Mcg/act Aers (Albuterol sulfate) .... 2 inh q4h as needed shortness of breath 14)  Azithromycin 250 Mg Tabs (Azithromycin) .... 2 by  mouth today and then 1 daily for 4 days  Patient Instructions: 1)  It was a pleasure to meet you today! 2)  I am prescribing an antibiotic, a steroid, and an inhaler today. 3)  Please follow up in 2-3 weeks if you are not feeling better. Prescriptions: PREDNISONE  20 MG TABS (PREDNISONE) take two tablets daily for 7 days, then 1 tablet daily for 7  days, then 1/2 tab daily for 7 days, then stop  #1qs x 0   Entered and Authorized by:   Helane Rima DO   Signed by:   Helane Rima DO on 01/11/2010   Method used:   Electronically to        [facility] (retail)       [address]       [place], [place]  [address]       Ph: [phone] or [phone]       Fax: [phone]   RxID:   [id number] AZITHROMYCIN 250 MG  TABS (AZITHROMYCIN) 2 by  mouth today and then 1 daily for 4 days  #6 x 0   Entered and Authorized by:   Helane Rima DO   Signed by:   Helane Rima DO on 01/11/2010   Method used:   Electronically to        [facility] (retail)       [address]       [place], [place]  [address]       Ph: [phone] or [phone]       Fax: [phone]   RxID:   [id number] PROAIR HFA 108 (90 BASE) MCG/ACT  AERS (ALBUTEROL SULFATE) 2 inh q4h as needed shortness of breath  #1 x 3   Entered and Authorized by:   Helane Rima DO   Signed by:   Helane Rima DO on 01/11/2010   Method used:   Electronically to        [facility] (retail)       [address]       [place], [place]  [address]       Ph: [phone] or [phone]       Fax: [phone]   RxID:   [id number]

## 2010-09-15 NOTE — Assessment & Plan Note (Signed)
Summary: flu shot wp  Nurse Visit   Vital Signs:  Patient Profile:   77 Years Old Male Height:     68 inches Temp:     97.8 degrees F                 Prior Medications: CADUET 10-20 MG TABS (AMLODIPINE-ATORVASTATIN) Take 1 tablet by mouth at bedtime HYDROCHLOROTHIAZIDE 25 MG TABS (HYDROCHLOROTHIAZIDE) Take one tablet daily FLOMAX 0.4 MG  CP24 (TAMSULOSIN HCL) Take one tablet daily ADULT ASPIRIN EC LOW STRENGTH 81 MG  TBEC (ASPIRIN) Take one tablet daily THERAPEUTIC MULTIVITAMIN   TABS (MULTIPLE VITAMIN) Take one tablet daily GLUCOSAMINE-CHONDROITIN 1500-1200 MG/30ML  LIQD (GLUCOSAMINE-CHONDROITIN) Take three  tablest daily DOVONEX 0.005 %  CREA (CALCIPOTRIENE) Apply three times daily Current Allergies: No known allergies    Influenza Vaccine    Vaccine Type: Fluvax 3+    Site: right deltoid    Mfr: Sanofi Pasteur    Dose: 0.5 ml    Route: IM    Given by: Golden Circle RN    Exp. Date: 02/11/2008    Lot #: [id number]    VIS given: 02/10/05 version given May 29, 2007.  Flu Vaccine Consent Questions    Do you have a history of severe allergic reactions to this vaccine? no    Any prior history of allergic reactions to egg and/or gelatin? no    Do you have a sensitivity to the preservative Thimersol? no    Do you have a past history of Guillan-Barre Syndrome? no    Do you currently have an acute febrile illness? no    Have you ever had a severe reaction to latex? no    Vaccine information given and explained to patient? yes   Orders Added: 1)  Flu Vaccine 36yrs + [90658] 2)  Admin 1st Vaccine [90471] 3)  Est Level 1- [facility] [08657]    ]

## 2010-09-15 NOTE — Assessment & Plan Note (Signed)
Summary: flu shot,df  Nurse Visit   Vital Signs:  Patient profile:   77 year old male Temp:     97.8 degrees F  Vitals Entered By: Theresia Lo RN (June 01, 2009 3:16 PM)  Allergies: No Known Drug Allergies  Immunizations Administered:  Influenza Vaccine # 1:    Vaccine Type: Fluvax MCR    Site: left deltoid    Mfr: GlaxoSmithKline    Dose: 0.5 ml    Route: IM    Given by: Theresia Lo RN    Exp. Date: 02/10/2010    Lot #: [id number]    VIS given: 03/23/2009  Flu Vaccine Consent Questions:    Do you have a history of severe allergic reactions to this vaccine? no    Any prior history of allergic reactions to egg and/or gelatin? no    Do you have a sensitivity to the preservative Thimersol? no    Do you have a past history of Guillan-Barre Syndrome? no    Do you currently have an acute febrile illness? no    Have you ever had a severe reaction to latex? no    Vaccine information given and explained to patient? yes  Orders Added: 1)  Influenza Vaccine MCR [00025] 2)  Administration Flu vaccine - MCR [G0008]

## 2010-09-15 NOTE — Progress Notes (Signed)
Summary: Handicap Sticker  Phone Note Call from Patient Call back at Home Phone [phone]   Caller: Wife, Darl Pikes Reason for Call: Talk to Nurse Summary of Call: Pt needs handicap sticker renewed and wants to see what she needs to do for it. Initial call taken by: Haydee Salter,  October 16, 2007 3:24 PM  Follow-up for Phone Call        will fwd. to dr.Joseph Johns Follow-up by: Arlyss Repress CMA,,  October 16, 2007 3:40 PM  Additional Follow-up for Phone Call Additional follow up Details #1::        Spoke with Mr. Towry and told him it was ready for  pick up. Additional Follow-up by: Haydee Salter,  October 17, 2007 4:22 PM

## 2010-09-15 NOTE — Assessment & Plan Note (Signed)
Summary: routine visit/med refills/el   Vital Signs:  Patient Profile:   77 Years Old Male Height:     68 inches Weight:      167.9 pounds BMI:     25.62 Temp:     98.1 degrees F Pulse rate:   79 / minute BP sitting:   144 / 86  Pt. in pain?   no  Vitals Entered By: Altamese Dilling CMA, (April 23, 2007 8:32 AM)                   PCP:  Zachery Dauer MD  Chief Complaint:  Multiple medical problems or concerns.  History of Present Illness: Feeling well. R hip pain limits walking to about a mile due to discomfort and fatigue. Recovers in 10 min. No calf pain. No chest pain. Walks twice weekly, exercycles 3 times weekly, no swimming. Weights with arms.   Hypertension History:      He denies headache, chest pain, palpitations, dyspnea with exertion, orthopnea, PND, peripheral edema, visual symptoms, neurologic problems, syncope, and side effects from treatment.  Further comments include: Early cataracts per Dr Dagoberto Ligas.        Positive major cardiovascular risk factors include male age 50 years old or older, hyperlipidemia, and hypertension.     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 Lungs are clear to auscultation, no crackles or wheezes. Heart:     Normal rate and regular rhythm. S1 and S2 normal without gallop, murmur, click, rub or other extra sounds. Abdomen:     Bowel sounds positive,abdomen soft and non-tender without masses, organomegaly or hernias noted. Suprapubic surgical scar, old.  Genitalia:     hypospadias.   Skin:     Patches of psorriatic rash and areas of atrophy, mainly on legs Psych:     Oriented X3, good eye contact, not anxious appearing, and not depressed appearing.      Impression & Recommendations:  Problem # 1:  HYPERTENSION, BENIGN SYSTEMIC (ICD-401.1) Assessment: Improved  His updated medication list for this problem includes:    Caduet 10-20 Mg Tabs (Amlodipine-atorvastatin) .Marland Kitchen... Take 1 tablet by mouth at bedtime    Hydrochlorothiazide 25 Mg Tabs (Hydrochlorothiazide) .Marland Kitchen... Take one tablet daily  Orders: [facility]- Est  Level 4 (99214) Comp Met-[facility] ([address]) nl CBC-[facility] (85027) nl   Problem # 2:  BPH (ICD-600) Assessment: Unchanged  Orders: PSA (Medicare)-[facility] (U9811) stable  Problem # 3:  HYPERLIPIDEMIA (ICD-272.4) Assessment: Improved Continue same His updated medication list for this problem includes:    Caduet 10-20 Mg Tabs (Amlodipine-atorvastatin) .Marland Kitchen... Take 1 tablet by mouth at bedtime  Orders: [facility]- Est  Level 4 (99214) Lipid-[facility] ([address])   Problem # 4:  PSORIASIS (ICD-696.1) Assessment: Unchanged  Complete Medication List: 1)  Caduet 10-20 Mg Tabs (Amlodipine-atorvastatin) .... Take 1 tablet by mouth at bedtime 2)  Hydrochlorothiazide 25 Mg Tabs (Hydrochlorothiazide) .... Take one tablet daily 3)  Flomax 0.4 Mg Cp24 (Tamsulosin hcl) .... Take one tablet daily 4)  Adult Aspirin Ec Low Strength 81 Mg Tbec (Aspirin) .... Take one tablet daily 5)  Therapeutic Multivitamin Tabs (Multiple vitamin) .... Take one tablet daily 6)  Glucosamine-chondroitin 1500-1200 Mg/26ml Liqd  (Glucosamine-chondroitin) .... Take three  tablest daily 7)  Dovonex 0.005 % Crea (Calcipotriene) .... Apply three times daily  Hypertension Assessment/Plan:      The patient's hypertensive risk group is category B: At least one risk factor (excluding diabetes) with no target organ damage.  Today's blood pressure is 144/86.  His blood pressure goal is < 140/90.   Patient Instructions: 1)  Please schedule a follow-up appointment in 1 year. 2)  Limit your Sodium (Salt). 3)  Take calcium 1000 mg +Vitamin D daily. 4)  Take a baby Aspirin every day. 5)  Reduce alcohol intake to 2 drinks daily

## 2010-09-15 NOTE — Progress Notes (Signed)
Summary: triage  Phone Note Call from Patient Call back at Home Phone [phone]   Caller: Patient Summary of Call: cough/congestion/sinus issues - would like something called in to [facility] Initial call taken by: De Nurse,  Dec 21, 2009 10:44 AM  Follow-up for Phone Call        sick x 11 days.  told him unable to call anything in unless seen. cannot come today. appt at 8:30 with Dr. Sharen Hones Follow-up by: Golden Circle RN,  Dec 21, 2009 11:28 AM

## 2010-09-15 NOTE — Assessment & Plan Note (Signed)
Summary: f/u,df   Vital Signs:  Patient profile:   77 year old male Height:      68 inches Weight:      168 pounds BMI:     25.64 Temp:     97.9 degrees F oral Pulse rate:   76 / minute BP sitting:   164 / 79  (left arm) Cuff size:   regular  Vitals Entered By: [place] Morelle, SMA  Serial Vital Signs/Assessments:  Time      Position  BP       Pulse  Resp  Temp     By 9:12 AM   R Arm     144/72                         Zachery Dauer MD 9:12 AM   L Arm     158/88                         Zachery Dauer MD  CC: Pt needs refill on caduet tabs Pain Assessment Patient in pain? no        Primary Care Provider:  Zachery Dauer MD  CC:  Pt needs refill on caduet tabs.  History of Present Illness: Has been busy traveling which interferes with walking for exercise. Can walk 3/4 mile but does get some discomfort in the left lower leg. Hips hurt laterally. Hasn't seen an orthopedist in recent years.    Last month Dr Hart Rochester said his circulation is fine.   Good energy, but feels cold at times.   Hasn't taken his blood pressure at home. Didn't take any medicines this AM anticipating a lab test.   Saw Dr Vonita Moss 6 months ago. Continues on Flomax. Nocturia x 1 some nights. Sl dribbling.   Psorriasis worse. Dr Vernona Rieger changed from [facility] to new medication which is supposed to cause less skin atrophy  Continues to drink 5-6 glasses of wine and one of Cognac daily.     Habits & Providers  Alcohol-Tobacco-Diet     Tobacco Status: quit  Current Medications (verified): 1)  Caduet 10-20 Mg Tabs (Amlodipine-Atorvastatin) .... Take 1 Tablet By Mouth At Bedtime 2)  Hydrochlorothiazide 25 Mg Tabs (Hydrochlorothiazide) .... Take One Tablet Daily 3)  Flomax 0.4 Mg  Cp24 (Tamsulosin Hcl) .... Take One Tablet Daily 4)  Adult Aspirin Ec Low Strength 81 Mg  Tbec (Aspirin) .... Take One Tablet Daily 5)  Therapeutic Multivitamin   Tabs (Multiple Vitamin) .... Take One Tablet Daily 6)   Glucosamine-Chondroitin 1500-1200 Mg/18ml  Liqd (Glucosamine-Chondroitin) .... Take Three  Tablest Daily 7)  Vectical 3 Mcg/gm Oint (Calcitriol) .... Apply Two Times A Day Prn 8)  Ibuprofen 600 Mg Tabs (Ibuprofen) .... Take One Tab Three Times A Day As Needed 9)  Acetaminophen 500 Mg Tabs (Acetaminophen) .... Take Two Tabs Two Times A Day As Needed 10)  Fish Oil   Oil (Fish Oil) .... One Cap Daily  Allergies (verified): No Known Drug Allergies   Complete Medication List: 1)  Caduet 10-20 Mg Tabs (Amlodipine-atorvastatin) .... Take 1 tablet by mouth at bedtime 2)  Hydrochlorothiazide 25 Mg Tabs (Hydrochlorothiazide) .... Take one tablet daily 3)  Flomax 0.4 Mg Cp24 (Tamsulosin hcl) .... Take one tablet daily 4)  Adult Aspirin Ec Low Strength 81 Mg Tbec (Aspirin) .... Take one tablet daily 5)  Therapeutic Multivitamin Tabs (Multiple vitamin) .... Take one tablet daily 6)  Glucosamine-chondroitin 1500-1200  Mg/22ml Liqd (Glucosamine-chondroitin) .... Take three  tablest daily 7)  Vectical 3 Mcg/gm Oint (Calcitriol) .... Apply two times a day prn 8)  Ibuprofen 600 Mg Tabs (Ibuprofen) .... Take one tab three times a day as needed 9)  Acetaminophen 500 Mg Tabs (Acetaminophen) .... Take two tabs two times a day as needed 10)  Fish Oil Oil (Fish oil) .... One cap daily  Other Orders: [facility]- Est  Level 4 (99214) Comp Met-[facility] ([address]) Lipid-[facility] ([address]) CBC-[facility] (29562)  Patient Instructions: 1)  Please schedule a follow-up appointment in 2 months.  2)  Decrease alcohol and sodium to reduce blood pressure  3)  Goal is blood pressure under 140/90 4)  Today 142/82  and 158/88 but you didn't take your medicines Prescriptions: CADUET 10-20 MG TABS (AMLODIPINE-ATORVASTATIN) Take 1 tablet by mouth at bedtime  #30 x 11   Entered and Authorized by:   Zachery Dauer MD   Signed by:   Zachery Dauer MD on 11/25/2009   Method used:   Electronically to        [facility]* (mail-order)              ,          Ph: [phone]       Fax: [phone]   RxID:   [id number]       Prevention & Chronic Care Immunizations   Influenza vaccine: Fluvax MCR  (06/01/2009)   Influenza vaccine due: 05/13/2009    Tetanus booster: 02/11/2006: Done.   Tetanus booster due: 02/12/2016    Pneumococcal vaccine: Done.  (11/13/1999)   Pneumococcal vaccine due: None    H. zoster vaccine: 09/20/2006: given  Colorectal Screening   Hemoccult: Done.  (12/13/1999)   Hemoccult due: 12/12/2000    Colonoscopy: Results: Normal. Results: Hemorrhoids.     Location:  [facility].     (10/12/2009)   Colonoscopy action/deferral: Repeat colonoscopy in 5 years.   (10/12/2009)   Colonoscopy due: 08/15/2011  Other Screening   PSA: 4.15  (06/17/2008)   PSA due due: 06/17/2009   Smoking status: quit  (11/25/2009)  Lipids   Total Cholesterol: 143  (06/17/2008)   LDL: 76  (06/17/2008)   LDL Direct: Not documented   HDL: 43  (06/17/2008)   Triglycerides: 122  (06/17/2008)    SGOT (AST): 19  (06/17/2008)   SGPT (ALT): 19  (06/17/2008) CMP ordered    Alkaline phosphatase: 75  (06/17/2008)   Total bilirubin: 0.8  (06/17/2008)    Lipid flowsheet reviewed?: Yes   Progress toward LDL goal: Unchanged  Hypertension   Last Blood Pressure: 164 / 79  (11/25/2009)   Serum creatinine: 0.89  (12/22/2008)   Serum potassium 3.9  (12/22/2008) CMP ordered     Hypertension flowsheet reviewed?: Yes   Progress toward BP goal: Deteriorated  Self-Management Support :   Personal Goals (by the next clinic visit) :      Personal blood pressure goal: 140/90  (11/25/2009)     Personal LDL goal: 130  (11/25/2009)    Hypertension self-management support: Education handout, Written self-care plan  (11/25/2009)   Hypertension self-care plan printed.   Hypertension education handout printed    Lipid self-management support: Not documented

## 2010-09-15 NOTE — Assessment & Plan Note (Signed)
Summary: med ck,tcb   Vital Signs:  Patient profile:   77 year old male Height:      68 inches Weight:      165.25 pounds BMI:     25.22 Temp:     97.9 degrees F oral Pulse rate:   73 / minute Pulse rhythm:   regular BP sitting:   150 / 78  (left arm)  Vitals Entered By: Modesta Messing LPN (Dec 22, 2008 1:34 PM)  Serial Vital Signs/Assessments:  Time      Position  BP       Pulse  Resp  Temp     By                     142/72                         Zachery Dauer MD  CC: Annual checkup. Pain Assessment Patient in pain? no        History of Present Illness: No complaints, but would like to lose 5 lbs. Ideal weight 155. His home blood pressure's are in the normal range.  Nocturia x 0-1. Cutting off during early morning. No incontinence. Tea makes it worse. Not much of a problem.   Only drinking alcohol when out socially. Still not driving because his wife does and it's not worth the hassle to reapply for his license.   Using the exercycle daily and also does upper body exercise   Allergies: No Known Drug Allergies  Physical Exam  General:  Well-developed,well-nourished,in no acute distress; alert,appropriate and cooperative throughout examination Lungs:  Normal respiratory effort, chest expands symmetrically. Lungs are clear to auscultation, no crackles or wheezes. Heart:  Normal rate and regular rhythm. S1 and S2 normal without gallop, murmur, click, rub or other extra sounds. Abdomen:  Bowel sounds positive,abdomen soft and non-tender without masses, organomegaly or hernias noted. Suprapubic surgical scar, old.  Msk:  Decreased ext rot of both hips Skin:  Patches of psorriatic rash and areas of atrophy, mainly on legs and back Psych:  Oriented X3, good eye contact, not anxious appearing, and not depressed appearing,    Impression & Recommendations:  Problem # 1:  HYPERTROPHY PROSTATE W/O UR OBST & OTH LUTS (ICD-600.00) Adequate control of symptoms with Flomax   Problem # 2:  HYPERTENSION, BENIGN SYSTEMIC (ICD-401.1) Assessment: Unchanged Higher today, but was on the low side last visit and normal at home, so we'll continue the same therapy with reminder to limit salt. His updated medication list for this problem includes:    Caduet 10-20 Mg Tabs (Amlodipine-atorvastatin) .Marland Kitchen... Take 1 tablet by mouth at bedtime    Hydrochlorothiazide 25 Mg Tabs (Hydrochlorothiazide) .Marland Kitchen... Take one tablet daily  Orders: Basic Met-[facility] ([id number]) [facility]- Est Level  3 (52841)  Problem # 3:  PSORIASIS (ICD-696.1) Assessment: Improved  Complete Medication List: 1)  Caduet 10-20 Mg Tabs (Amlodipine-atorvastatin) .... Take 1 tablet by mouth at bedtime 2)  Hydrochlorothiazide 25 Mg Tabs (Hydrochlorothiazide) .... Take one tablet daily 3)  Flomax 0.4 Mg Cp24 (Tamsulosin hcl) .... Take one tablet daily 4)  Adult Aspirin Ec Low Strength 81 Mg Tbec (Aspirin) .... Take one tablet daily 5)  Therapeutic Multivitamin Tabs (Multiple vitamin) .... Take one tablet daily 6)  Glucosamine-chondroitin 1500-1200 Mg/64ml Liqd (Glucosamine-chondroitin) .... Take three  tablest daily 7)  Dovonex 0.005 % Crea (Calcipotriene) .... Apply three times daily 8)  Ibuprofen 600 Mg Tabs (Ibuprofen) .... Take  one tab three times a day as needed 9)  Acetaminophen 500 Mg Tabs (Acetaminophen) .... Take two tabs two times a day as needed  Other Orders: Vit D, 25 [facility] ([id number]) Future Orders: Lipid-[facility] ([id number]) ... 12/13/2009 Comp Met-[facility] ([id number]) ... 12/13/2009  Patient Instructions: 1)  Please schedule a follow-up appointment in 6 months .  2)  Limit your Sodium(salt) .  3)  Schedule fasting labs 1-2 weeks before the next visit

## 2010-10-24 ENCOUNTER — Encounter: Payer: Self-pay | Admitting: Home Health Services

## 2010-11-15 ENCOUNTER — Encounter (INDEPENDENT_AMBULATORY_CARE_PROVIDER_SITE_OTHER): Payer: Medicare Other

## 2010-11-15 DIAGNOSIS — I724 Aneurysm of artery of lower extremity: Secondary | ICD-10-CM

## 2010-11-15 DIAGNOSIS — Z48812 Encounter for surgical aftercare following surgery on the circulatory system: Secondary | ICD-10-CM

## 2010-11-21 NOTE — Procedures (Unsigned)
BYPASS GRAFT EVALUATION  INDICATION:  Left lower extremity bypass graft.  HISTORY: Diabetes:  No. Cardiac:  No. Hypertension:  Yes. Smoking:  Previous. Previous Surgery:  Left distal superficial femoral to popliteal artery bypass graft on 10/14/2004.  SINGLE LEVEL ARTERIAL EXAM                              RIGHT              LEFT Brachial:                    155                156 Anterior tibial:             159                199 Posterior tibial:            147                167 Peroneal: Ankle/brachial index:        1.02               1.28  PREVIOUS ABI:  Date: 10/19/2009  RIGHT:  0.98  LEFT:  1.06  LOWER EXTREMITY BYPASS GRAFT DUPLEX EXAM:  DUPLEX:  Biphasic Doppler waveforms noted throughout the left lower extremity bypass graft with no increase in velocities.  Velocity of 234 cm/s noted in the left distal superficial femoral artery.  IMPRESSION: 1. Patent left lower extremity bypass graft with velocity of 234 cm/s     noted in the left distal superficial femoral artery. 2. Bilateral ankle brachial indices and Doppler waveforms suggest     normal perfusion of the bilateral lower extremities.  Stable     bilateral ankle brachial indices noted when compared to the     previous examination.  ___________________________________________ Quita Skye Hart Rochester, M.D.  CH/MEDQ  D:  11/16/2010  T:  11/16/2010  Job:  [id number]

## 2010-11-30 ENCOUNTER — Other Ambulatory Visit: Payer: Self-pay | Admitting: Family Medicine

## 2010-11-30 NOTE — Telephone Encounter (Signed)
Refill request

## 2010-12-27 NOTE — Procedures (Signed)
BYPASS GRAFT EVALUATION   INDICATION:  Followup left lower extremity bypass graft   HISTORY:  Diabetes:  No  Cardiac:  No  Hypertension:  Yes  Smoking:  Quit  Previous Surgery:  Left distal SFA to popliteal artery bypass graft  11/11/2004 by Dr. Hart Rochester.   SINGLE LEVEL ARTERIAL EXAM                               RIGHT              LEFT  Brachial:                    130                132  Anterior tibial:             126                140  Posterior tibial:            120                144  Peroneal:  Ankle/brachial index:        0.95               1.09   PREVIOUS ABI:  Date: 10/22/2007.  RIGHT:  1.01  LEFT:  1.01   LOWER EXTREMITY BYPASS GRAFT DUPLEX EXAM:   DUPLEX:  1. Doppler arterial waveforms appear triphasic proximal to, within and      distal to bypass graft.  2. Area of mild elevated velocities proximal to graft of 191 cm/s.  3. Right popliteal artery measures 0.79 cm X 0.83 cm; left measures      1.92 cm X 1.82 cm.   IMPRESSION:  1. Patent left superficial femoral artery to popliteal artery bypass      graft.  2. Stable ankle-brachial indexes bilaterally.  3. No significant changes from previous study.   ___________________________________________  Quita Skye Hart Rochester, M.D.   AS/MEDQ  D:  11/10/2008  T:  11/10/2008  Job:  [id number]

## 2010-12-27 NOTE — Procedures (Signed)
BYPASS GRAFT EVALUATION   INDICATION:  Follow-up evaluation of lower extremity bypass graft.   HISTORY:  Diabetes:  No.  Cardiac:  No.  Hypertension:  Yes.  Smoking:  Quit 20 years ago.  Previous Surgery:  Left distal superficial femoral artery to above-knee  popliteal bypass graft with reverse saphenous vein on 11/11/04 by Dr.  Hart Rochester.   SINGLE LEVEL ARTERIAL EXAM                               RIGHT              LEFT  Brachial:                    132                136  Anterior tibial:             138                138  Posterior tibial:            130                136  Peroneal:  Ankle/brachial index:        >1.0               >1.0   PREVIOUS ABI:  Date: 10/23/06  RIGHT:  0.96  LEFT:  >1.0   LOWER EXTREMITY BYPASS GRAFT DUPLEX EXAM:   DUPLEX:  Doppler arterial waveforms are triphasic proximal to, within,  and distal to the left superficial femoral to below-knee popliteal  bypass graft.  Right popliteal artery measured 0.69 cm in diameter on  10/23/06.  Today measured 0.73 cm.  The left popliteal artery measures  1.8 cm X 1.8 cm today with no flow identified within.   IMPRESSION:  1. Ankle brachial indices are stable from previous study bilaterally.  2. Patent left superficial femoral to below knee popliteal bypass      graft.  3. No significant change in diameter of right popliteal artery.   ___________________________________________  Quita Skye Hart Rochester, M.D.   MC/MEDQ  D:  10/22/2007  T:  10/22/2007  Job:  [id number]

## 2010-12-27 NOTE — Procedures (Signed)
BYPASS GRAFT EVALUATION   INDICATION:  Follow up left superficial femoral artery to popliteal  bypass graft.   HISTORY:  Diabetes:  No.  Cardiac:  No.  Hypertension:  Yes.  Smoking:  Quit.  Previous Surgery:  Left distal superficial femoral artery to popliteal  artery bypass graft on 10/14/04 by Dr. Hart Rochester.   SINGLE LEVEL ARTERIAL EXAM                               RIGHT              LEFT  Brachial:                    120                119  Anterior tibial:             117                127  Posterior tibial:            107                160  Peroneal:  Ankle/brachial index:          0.98               1.06   PREVIOUS ABI:  Date: 11/10/08  RIGHT:  0.95  LEFT:  1.09   LOWER EXTREMITY BYPASS GRAFT DUPLEX EXAM:   DUPLEX:  Patent left superficial femoral artery to popliteal artery  bypass graft with elevated velocities of 233 cm at inflow artery.   IMPRESSION:  1. Stable, normal ankle brachial indices bilaterally.  2. Patent left superficial femoral artery to popliteal artery bypass      graft with elevated velocities, as described above.   ___________________________________________  Jeffrey Meadows. Hart Rochester, M.D.   CB/MEDQ  D:  10/19/2009  T:  10/20/2009  Job:  [id number]

## 2010-12-30 NOTE — Op Note (Signed)
NAME:  Quinley, Enio               ACCOUNT NO.:  [id number]   MEDICAL RECORD NO.:  [id number]          PATIENT TYPE:  OIB   LOCATION:  2866                         FACILITY:  [facility]   PHYSICIAN:  Quita Skye. Hart Rochester, M.D.  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   PROCEDURE:  1.  Abdominal aortogram with bilateral lower extremity runoff via right      common femoral approach.  2.  Selective catheterization, left common iliac artery with left leg      angiogram.   SURGEON:  Quita Skye. Hart Rochester, M.D.   ANESTHESIA:  Local Xylocaine and Versed 2 milligrams intravenously.   CONTRAST:  136 cc.   COMPLICATIONS:  None.   ESTIMATED BLOOD LOSS:  0.   PROCEDURE:  The patient was taken to the [facility] peripheral endovascular  lab, placed in supine position at which time both groins were prepped with  Betadine scrub and solution and draped in routine sterile manner. After  infiltration of 1% Xylocaine, the right common femoral artery was entered  percutaneously. Guidewire passed into the suprarenal aorta under  fluoroscopic guidance. The 5-French sheath and dilator were passed over the  guide wire, dilator removed and a standard pigtail catheter positioned in  the suprarenal aorta. Flush abdominal aortogram performed injecting 20 cc of  contrast at 20 cc per second. This revealed the aorta to be widely patent  proximally with single renal arteries bilaterally with no stenosis. There  was a moderate amount of atherosclerosis in the infrarenal aorta with an  approximate 40-50% stenosis in the mid infrarenal aorta with ulceration  noted. The bifurcation was widely patent and both common iliac arteries were  mildly to moderately diseased with no areas of severe stenosis. Both  internal  iliac arteries appeared to be moderately diseased as well with  moderate narrowing throughout. The external iliac arteries were widely  patent. Catheter was withdrawn into the terminal aorta and bilateral lower  extremity runoff performed injecting 88 cc of contrast at 8 cc per second.  This revealed the right leg to have a patent common femorals, superficial  femoral, profunda femoris and popliteal artery with three-vessel runoff on  the right which appeared relatively normal. On the left, the common and  superficial and profunda femoris arteries were widely patent down to just  distal to the adductor canal. There was a saccular popliteal aneurysm just  above the knee joint measuring about 3.5 to 4 cm in diameter with good  filling of the popliteal artery beginning about 3 to 4 cm above the knee  joint and extending across the knee joint widely patent. There was three-  vessel runoff on the left although there was evidence of previous  embolization in the anterior tibial and peroneal arteries with posterior  tibial artery being the best vessel.  To get additional views of the  popliteal aneurysm and the runoff, the pigtail  catheter was exchanged for an  IMA catheter and the left common iliac artery was selectively cannulated and  additional views of the popliteal aneurysm and runoff were done using the  peak hold technique. This confirmed the above findings.  IMA catheter was  removed over the guidewire, the sheath removed, adequate compression  applied. No complications ensued.   FINDINGS:  1.  Moderate infrarenal atherosclerosis of abdominal aorta with 40-50%      stenosis and ulceration.  2.  Mild-to-moderate bilateral common iliac occlusive disease with no severe      stenoses.  3.  Single renal arteries bilaterally.  4.  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  5.  Widely patent right superficial femoral, popliteal and tibial vessels.      JDL/MEDQ  D:  11/09/2004  T:  11/09/2004  Job:  [id number]

## 2010-12-30 NOTE — Procedures (Signed)
[facility]  Patient:    Jeffrey Meadows, Jeffrey Meadows                        MRN: [id number] Proc. Date: 06/19/00 Adm. Date:  [id number] Attending:  Orland Mustard CC:         Wayne A. Sheffield Slider, M.D.   Procedure Report  PROCEDURE:  Colonoscopy and polypectomy.  MEDICATIONS:  Fentanyl 70 mcg, Versed 4 mg IV.  INDICATION:  Cancer screening.  DESCRIPTION OF PROCEDURE:  The procedure was explained to the patient and consent obtained.  He was placed in the left lateral decubitus position.  The Olympus adult video colonoscope was inserted and advanced under direct visualization.  The prep was marginal.  Some areas at the tip of the cecum were obscured by sticky, adherent stool.  We were able to see the cecum reasonably well, and no large lesions were seen.  The scope was withdrawn.  At the hepatic flexure, a 2 cm polyp that we thought was originally sessile but turned out to be on a short stalk was seen.  It was irregular.  We were able to get the snare around it and remove it, and we retrieved it with the tripod retriever.  No other gross polyps were seen throughout the colon.  There was still some sticky, adherent stool.  A small polyp could have been missed.  The scope was withdrawn.  The patient tolerated the procedure well.  He was maintained on low-flow oxygen throughout the procedure.  ASSESSMENT:  Hepatic flexure polyp removed.  PLAN:  Due to the size of this polyp, we would recommend repeating in one year.  Routine postpolypectomy instructions. DD:  06/19/00 TD:  06/19/00 Job: [id number]

## 2010-12-30 NOTE — Op Note (Signed)
NAME:  Jeffrey Meadows, Jeffrey Meadows               ACCOUNT NO.:  [id number]   MEDICAL RECORD NO.:  [id number]          PATIENT TYPE:  AMB   LOCATION:  ENDO                         [facility]   PHYSICIAN:  James L. Malon Kindle., M.D.DATE OF BIRTH:  07-27-1933   DATE OF PROCEDURE:  09/19/2004  DATE OF DISCHARGE:                                 OPERATIVE REPORT   PROCEDURE:  Colonoscopy.   MEDICATIONS:  Fentanyl 75 mcg, Versed 8 mg IV.   SCOPE:  Olympus pediatric adjustable colonoscope.   INDICATIONS FOR PROCEDURE:  The patient has had a previous history of  adenomatous polyps. This is done as a followup procedure.   DESCRIPTION OF PROCEDURE:  The procedure had been explained to the patient  and consent obtained. With the patient in the left lateral decubitus  position, the Olympus scope was inserted and advanced. The prep was very  marginal, some areas were completely obscured. The scope was withdrawn and  the cecum, ascending colon, transverse colon, splenic flexure, descending  and sigmoid colon were seen well and no polyps or other lesions were seen.  Again, the prep was somewhat poor.  A small polyp could have been missed but  certainly with vigorous irrigation, there was no signs of any significant  polyps. The scope was withdrawn, the rectum was free of polyps.   ASSESSMENT:  History of polyps with negative colonoscopy at this time,  V12.72.   PLAN:  Will recommend yearly Hemoccult's and repeat colonoscopy in five  years.      JLE/MEDQ  D:  09/19/2004  T:  09/19/2004  Job:  [id number]   cc:   Deniece Portela A. Sheffield Slider, M.D.  Fax: [phone]

## 2010-12-30 NOTE — Op Note (Signed)
Jeffrey Meadows, Jeffrey Meadows               ACCOUNT NO.:  [id number]   MEDICAL RECORD NO.:  [id number]          PATIENT TYPE:  INP   LOCATION:  2899                         FACILITY:  [facility]   PHYSICIAN:  Quita Skye. Hart Rochester, M.D.  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   OPERATIONS:  Resection of left popliteal artery aneurysm with insertion of a  distal left superficial femoral artery to above-knee popliteal artery bypass  using a reverse saphenous vein graft to left leg with intraoperative  arteriogram.   SURGEON:  Quita Skye. Hart Rochester, M.D.   FIRST ASSISTANT:  Coral Ceo, P.A.   ANESTHESIA:  General endotracheal.   PROCEDURE:  Patient was taken to the operating room, placed in supine  position at which time satisfactory general endotracheal anesthesia was  administered. A medial incision was made above the knee carried down through  subcutaneous tissue being careful to avoid injury to the saphenous vein at  this point. Distal superficial femoral artery was exposed at the adductor  canal and traced down including the above-knee popliteal artery and the  large multilobulated popliteal aneurysm which was about 3.5 to 4 centimeters  in diameter was easily identified. It extended both posteromedially and  anteriorly. Proximal control of the artery was obtained and after mobilizing  the aneurysm gently, distal control was obtained above the knee where the  artery appeared relatively normal. Greater saphenous vein was then exposed  in the at the saphenofemoral junction through one short oblique incision  that was dissected free for a distance of about 6-8 cm. Branches ligated for  5-0 silk ties and divided. The patient  was heparinized, the popliteal  arteries occluded proximally and distally with vascular clamps. The aneurysm  was opened longitudinally and a large amount laminated thrombus was removed.  There were only about two branches which needed to be ligated from within  and using 5-0 Prolene. The majority of the wall of the aneurysm medially was  excised but the lateral aspect which was adjacent to the nerve and vein were  left in place but all the laminated thrombus was retrieved. The artery was  transected proximal and distal to this point spatulating both ends and  greater saphenous vein was used in a reverse fashion, spatulating the vein  and the artery using continuous 6-0 Prolene. When these two anastomoses were  completed, the clamps released and there was excellent pulse both in the  anastomosis and at the posterior tibial level of the ankle. Intraoperative  arteriogram revealed a widely patent anastomosis with three-vessel runoff  proximally. Protamine was given to reverse the heparin. Jackson-Pratt drain  was brought out through a superiorly based stab wound secured with silk  suture. The wound closed with Vicryl in a subcuticular fashion. Sterile  dressing applied. The patient taken to recovery in satisfactory condition.  JDL/MEDQ  D:  11/11/2004  T:  11/11/2004  Job:  [id number]

## 2010-12-30 NOTE — Discharge Summary (Signed)
Jeffrey Meadows, Jeffrey Meadows               ACCOUNT NO.:  [id number]   MEDICAL RECORD NO.:  [id number]          PATIENT TYPE:  INP   LOCATION:  2023                         FACILITY:  [facility]   PHYSICIAN:  Quita Skye. Hart Rochester, M.D.  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   DISCHARGE/SECONDARY DIAGNOSES:  1.  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  2.  Postoperative urinary retention.  3.  Hypertension.  4.  Hyperlipidemia.  5.  Raynaud's syndrome.  6.  History of pinning of the right femur.  7.  Status post right hip placement.  8.  History of hernia repair.   PROCEDURE/DIAGNOSTICS:  1.  On November 11, 2004, a resection of left popliteal artery aneurysm with      insertion of a distal left superficial femoral artery to above-the-knee      popliteal artery bypass using reverse saphenous vein graft to the left      leg with intraoperative arteriogram by Dr. Quita Skye. Hart Rochester.  2.  On November 14, 2004, lower extremity arterial evaluation showing      postoperative ABI of 0.88 on the right and 0.89 on the left.  There were      biphasic interior tibial Doppler signals and triphasic posterior tibial      Doppler signals on the right and monophasic/biphasic on the left.   HISTORY OF PRESENT ILLNESS:  Jeffrey Meadows is a 77 year old Caucasian male  who around November 07, 2004, awoke with discomfort in his left calf and some  bluish discoloration of the left foot.  The pain eased over the next few  hours, but he continued to have worsening claudication symptoms.  He had  some stable claudication in the left calf for the last few years.  He did  not have severe pain in the left foot, and had not sought medical attention.  He reports that over  the past three months, he had two similar episodes, but  the foot appeared very pale, and white rather than bluish, but the symptoms  in the calf were similar.  On November 08, 2004, he was being evaluated by Dr.  Venancio Poisson for chronic psoriasis, and because some skin lesion in the left  pretibial region had become dark.  She performed a skin biopsy of the  purpuric lesion to rule out vasculitis.  The results of that procedure are  unknown.  On November 09, 2004, Jeffrey Meadows underwent an abdominal aortogram  with bilateral lower extremity runoff and selective catheterization of the  left common iliac artery with left leg arteriogram.  The findings showed  moderate infrarenal atherosclerosis of the abdominal aorta with 40%-50%  stenosis and ulceration, mild to moderate bilateral common iliac occlusive  disease with no severe stenosis, single renal artery bilaterally, popliteal  artery aneurysm of the left, with widely  patent superficial femoral artery  proximally and popliteal artery distally, and three-vessel runoff with  evidence of embolization to the distal anterior tibial and peroneal arteries  on the left.  He had widely patent right superficial femoral, popliteal and  tibial vessels.  Based on the finding of left popliteal artery aneurysm, Dr.  Hart Rochester felt that he should be admitted for a resection and grafting.   HOSPITAL COURSE:  On November 11, 2004, Jeffrey Meadows was electively admitted to  [facility] to undergo a resection and grafting of his left  popliteal artery aneurysm.  There were no known intraoperative  complications.  A Jackson-Pratt drain was placed intraoperatively.  After a  short stay in the recovery room he was then transferred to unit 3300, a step-  down unit, in stable condition.  On the morning of postoperative day one, he  has remained hemodynamically stable.   On examination he had 1-2+ posterior tibial pulses on the left foot.  His  toes remained  warm, but somewhat dusky, which was felt to be his baseline.  There was minimal Jackson-Pratt drainage, and this was discontinued.  Orders  were also written for him to be transferred out to the floor, and to begin  mobilization.  Orders were also written for discontinuing his Foley  catheter.  Unfortunately he developed urinary retention and required re-  insertion.  Once on unit 2000, Jeffrey Meadows made good progress.  He did  require the initiation of Flomax for his urinary retention, but by the  morning of November 14, 2004, his Foley catheter was removed, and he was able to  void without difficulty.  By this time, he was also mobilizing  independently.  He had remained hemodynamically stable with morning vital  signs showing a temperature of 98.7 degrees, heart rate in the 80's,  respirations 20, blood pressure 130/72, oxygen saturation 91% on room air.  A lower extremity arterial evaluation results are discussed above.  On  examination he continued to have a 1-2+ posterior tibial pulse on the left  with his toes unchanged.  Postoperatively labs also remained stable.  His  pain was controlled on oral medications.  His incisions also appeared to be  healing well without signs of infection.   DISPOSITION:  At this time it was felt that he was appropriate for discharge  home.  Jeffrey Meadows was discharged home in stable condition on Monday, November 14, 2004.   LABORATORY DATA:  Recent labs showed a sodium of 136, potassium 3.5,  chloride 101, CO2 of 29, blood glucose 134, BUN 6, creatinine 0.9, calcium  9.3.  White blood count 9.7, hemoglobin 11.9, hematocrit 34.5, platelet  count 233.   DISCHARGE MEDICATIONS:  1.  Flomax 0.4 mg, one p.o. q.h.s.  2.  Hydrochlorothiazide 25 mg, one p.o. daily.  3.  Lipitor 20 mg, one p.o. daily.  4.  Oxycodone 5 mg, one p.o. q.4h. p.r.n. pain.   DISCHARGE INSTRUCTIONS:  He was instructed to avoid driving or heavy lifting.  He may progress his activity as  tolerated.  He should continue  breathing and walking exercises.   DIET:  He is to follow a heart-healthy diet.   WOUND CARE:  He may wash his incisions daily with soap and water.  He should  notify the [facility] is he develops any drainage or opening from his  incision site.   FOLLOWUP:  He is to see Dr. Hart Rochester in approximately three weeks at the [facility].  They will notify him of the specific date and time.    AWZ/MEDQ  D:  11/14/2004  T:  11/14/2004  Job:  [id number]   cc:   Venancio Poisson, M.D.

## 2010-12-30 NOTE — H&P (Signed)
NAME:  Jeffrey Meadows, Jeffrey Meadows               ACCOUNT NO.:  [id number]   MEDICAL RECORD NO.:  [id number]          PATIENT TYPE:  OIB   LOCATION:  2866                         FACILITY:  [facility]   PHYSICIAN:  Quita Skye. Hart Rochester, M.D.  DATE OF BIRTH:  [birth year]   DATE OF ADMISSION:  11/11/2004  DATE OF DISCHARGE:                                HISTORY & PHYSICAL   CHIEF COMPLAINT:  Recent onset of pain in the left calf with discoloration  left toes secondary to embolization from popliteal aneurysm.   HISTORY OF PRESENT ILLNESS:  This 77 year old gentleman four days ago awoke  with discomfort in his left calf and some bluish discoloration of the left  foot.  The pain eased over the next few hours but he continued to have  worsening claudication symptoms.  He has had stable claudication symptoms in  the left calf for the last few years.  He did not have severe pain in the  left foot and did not seek medical attention.  He states that over the past  three months he has had two similar episodes but the foot appeared very pale  and white rather than bluish but the symptoms in the calf were similar.  He  was being evaluated by Dr. Venancio Poisson today for his chronic psoriasis  because some skin lesions in the left pretibial region had become dark and  she also performed a skin biopsy of a purpuric lesion to rule out  vasculitis.   PAST MEDICAL HISTORY:  1.  Hypertension.  2.  Hyperlipidemia.  3.  History of Raynaud syndrome.  Negative for diabetes, coronary artery disease, stroke, COPD, or deep vein  thrombosis.   PREVIOUS SURGERY:  1.  Pinning of the right femur.  2.  Right hip replacement.  3.  Hernia repair.   FAMILY HISTORY:  Positive for congestive heart failure in his mother.  Cerebral aneurysm in his father.  Negative for coronary artery disease,  diabetes, and stroke.   SOCIAL HISTORY:  He is married, has one child and is retired.  He does not  smoke and has not for 20 years.  He drinks  occasional wine.   REVIEW OF SYSTEMS:  Denies any weight loss, anorexia, change in appetite,  chest pain, dyspnea on exertion, PND, orthopnea, palpitations, productive  cough, bronchitis, asthma, hematemesis, melena, symptoms of reflux  esophagitis, abdominal pain, or any urinary symptoms.  Main complaints are  related to his dermatologic problems with the psoriasis and the  claudication.   MEDICATIONS:  Norvasc, Lipitor, hydrochlorothiazide, glucosamine and  chondroitin, and Percocet 325 mg one p.r.n. for pain.   ALLERGIES:  None known.   PHYSICAL EXAMINATION:  VITAL SIGNS:  Blood pressure 124/80, heart rate is 76  and regular, respirations are 16.  GENERAL:  This is a healthy appearing male in no apparent distress.  He is  alert and oriented x 3.  NECK:  Supple.  Carotid pulses 3+ palpable.  No bruits are audible.  There  is no palpable adenopathy in the neck.  NEUROLOGIC:  Normal.  Extraocular  muscles intact.  Upper  extremities pulses were 3+ to brachial and radial  level.  CHEST:  Clear to auscultation.  CARDIOVASCULAR:  Reveals a regular rhythm.  No murmurs.  ABDOMEN:  Soft, nontender with no palpable masses.  EXTREMITIES:  Reveals 3+ femoral, popliteal, and dorsalis pedis pulse in the  right leg.  The left leg has a 3+ femoral pulse.  Popliteal pulse is  expansile with a 3- to -4-cm pulsatile mass.  His left calf has some mild  swelling but no significant tenderness.  There is bluish discoloration of  the left foot with sluggish capillary refill but good dorsiflexion  bilaterally.  No ulceration is noted.  He does have typical psoriatic  dermatitis in both pretibial regions with some dark areas involving the left  pretibial region.  The right leg is free of these dark areas.   Lower extremity arterial Doppler studies and duplex scanning revealed a left  popliteal aneurysm measuring 3.4 x 3.0-cm in diameter.  ABI on the right is  1.0 and on the left is 0.78.    IMPRESSION:  1.  Left femoral popliteal occlusive disease and left popliteal aneurysm      with recent history of embolization to the left leg.  2.  History of psoriasis.   PLAN:  Patient is for angiography, on November 09, 2004, and proceed with lower  extremity bypass grafting and exclusion of popliteal aneurysm on Friday,  November 11, 2004.      JDL/MEDQ  D:  11/08/2004  T:  11/08/2004  Job:  [id number]

## 2010-12-30 NOTE — Procedures (Signed)
[facility]  Patient:    Jeffrey Meadows, Jeffrey Meadows Visit Number: [id number] MRN: [id number]          Service Type: END Location: ENDO Attending Physician:  Orland Mustard Dictated by:   Llana Aliment. Randa Evens, M.D. Proc. Date: 08/26/01 Admit Date:  08/26/2001   CC:         Wayne A. Sheffield Slider, M.D.   Procedure Report  PROCEDURE:  Colonoscopy and coagulation of polyps.  MEDICATIONS:  Fentanyl 75 mcg, Versed 6 mg IV.  INDICATIONS FOR PROCEDURE:  Follow-up for previous history of adenomatous colon polyps.  SCOPE:  Adult Olympus colonoscope.  DESCRIPTION OF PROCEDURE:  The procedure had been explained to the patient and consent obtained. With the patient in the left lateral decubitus position, the Olympus adult video colonoscope was inserted and advanced under direct visualization. The prep was excellent. The patient had a long tortuous colon actually quite large compliance. We were eventually able to advance down into the cecum with the patient on his right side using abdominal pressure. The ileocecal valve and appendiceal orifice were clearly seen. We got an excellent look at this area. The scope was withdrawn and in the vicinity of the hepatic flexure which was felt to be actually in the transverse colon part, two polyps were seen and each of these was 3-5 mm, both were cauterized and placed in a single jar. No other polyps were seen in the transverse colon, splenic flexure, descending and sigmoid colon. No significant diverticular disease. The scope was withdrawn. The patient tolerated the procedure well. The rectum was free of polyps or any other significant lesions. He was maintained on low flow oxygen and pulse oximeter throughout the procedure.  ASSESSMENT:  Polyps at the hepatic flexure cauterized.  PLAN:  Routine post polypectomy instructions. Will recommend repeating in three years. Dictated by:   Llana Aliment. Randa Evens, M.D. Attending Physician:   Orland Mustard DD:  08/26/01 TD:  08/27/01 Job: [id number]

## 2011-01-27 ENCOUNTER — Ambulatory Visit: Payer: Medicare Other | Admitting: Sports Medicine

## 2011-01-31 ENCOUNTER — Encounter: Payer: Self-pay | Admitting: Family Medicine

## 2011-02-02 ENCOUNTER — Encounter: Payer: Self-pay | Admitting: Family Medicine

## 2011-02-02 ENCOUNTER — Ambulatory Visit (INDEPENDENT_AMBULATORY_CARE_PROVIDER_SITE_OTHER): Payer: Medicare Other | Admitting: Family Medicine

## 2011-02-02 VITALS — BP 158/58 | HR 76 | Temp 97.8°F | Ht 68.0 in | Wt 164.0 lb

## 2011-02-02 DIAGNOSIS — J438 Other emphysema: Secondary | ICD-10-CM

## 2011-02-02 DIAGNOSIS — E785 Hyperlipidemia, unspecified: Secondary | ICD-10-CM

## 2011-02-02 DIAGNOSIS — J309 Allergic rhinitis, unspecified: Secondary | ICD-10-CM | POA: Insufficient documentation

## 2011-02-02 DIAGNOSIS — N400 Enlarged prostate without lower urinary tract symptoms: Secondary | ICD-10-CM

## 2011-02-02 DIAGNOSIS — I10 Essential (primary) hypertension: Secondary | ICD-10-CM

## 2011-02-02 MED ORDER — ATORVASTATIN CALCIUM 20 MG PO TABS: 20.0000 mg | ORAL_TABLET | Freq: Every day | ORAL | Status: DC

## 2011-02-02 MED ORDER — FINASTERIDE 5 MG PO TABS: 5.0000 mg | ORAL_TABLET | Freq: Every day | ORAL | Status: DC

## 2011-02-02 MED ORDER — TAMSULOSIN HCL 0.4 MG PO CAPS: 0.8000 mg | ORAL_CAPSULE | Freq: Every day | ORAL | Status: DC

## 2011-02-02 MED ORDER — AMLODIPINE BESYLATE 10 MG PO TABS: 10.0000 mg | ORAL_TABLET | Freq: Every day | ORAL | Status: DC

## 2011-02-02 MED ORDER — FLUTICASONE-SALMETEROL 500-50 MCG/DOSE IN AEPB: 1.0000 | INHALATION_SPRAY | Freq: Two times a day (BID) | RESPIRATORY_TRACT | Status: DC

## 2011-02-02 MED ORDER — FLUTICASONE PROPIONATE 50 MCG/ACT NA SUSP: 2.0000 | Freq: Every day | NASAL | Status: DC

## 2011-02-02 NOTE — Progress Notes (Signed)
  Subjective:    Patient ID: Jeffrey Meadows, male    DOB: 1932/09/17, 77 y.o.   MRN: [id number]  HPI Pulm:  Predominant upper respiratory symptoms however with Hx emphysema, increased cough, SOB, during allergy season.  Using albuterol more often 3-4x a day however not using spacer.  Hasn't been using antihistamines 2/2 prostate obstructive symptoms.  Has not tried intranasal steroids.  Symptoms particularly troublesome with climbing stairs and physical exertion.  BPH:  On tamsulosin qd.  Nocturia is troublesome, 2x a night.  Would like to try 5 alpha reductase inhibitors.  Last PSA 2009, 4.15.  AUA sx score: 20-21.    HTN:  BP elevated but did not take meds this morning.  Due for CMET.  HLD:  Due for lipids.    Review of Systems    Neg except as in HPI. Objective:   Physical Exam  Constitutional: He appears well-developed and well-nourished. No distress.  HENT:  Head: Normocephalic and atraumatic.  Left Ear: External ear normal.  Mouth/Throat: Oropharynx is clear and moist. No oropharyngeal exudate.       Cerumen in R EAM.  Neck: Neck supple. No thyromegaly present.  Cardiovascular: Normal rate, regular rhythm and normal heart sounds.  Exam reveals no gallop and no friction rub.   No murmur heard. Pulmonary/Chest: Effort normal. No respiratory distress. He has no wheezes. He has no rales.  Musculoskeletal: He exhibits no edema.       Hip ROM R: IR to 30 deg, ER to 45 deg L: IR to 45 deg ER to 45 deg.  Lymphadenopathy:    He has no cervical adenopathy.  Skin: Skin is warm and dry.       Scattered hypopigmented patches from prior outbreaks of psoriasis  Psychiatric: He has a normal mood and affect. His behavior is normal. Judgment and thought content normal.          Assessment & Plan:

## 2011-02-02 NOTE — Assessment & Plan Note (Signed)
I suspect symptoms will improve greatly with advair use. Pt will start this. Avoiding spiriva with BPH/obstructive uropathy.

## 2011-02-02 NOTE — Assessment & Plan Note (Signed)
Will add finasteride. Can also increase tamsulosin to 0.8 Checking PSA

## 2011-02-02 NOTE — Assessment & Plan Note (Addendum)
Well controlled at last check. RTC to check lipids, CMET.

## 2011-02-02 NOTE — Assessment & Plan Note (Addendum)
BP elevated today but off meds. Pt can have BP rechecked when returns for fasting bloodwork.

## 2011-02-02 NOTE — Assessment & Plan Note (Signed)
Will rx fluticasone nasal.

## 2011-02-02 NOTE — Patient Instructions (Addendum)
Great to see you . See below for new medications.  Come back for fasting labs one week before next appointment. (make appt at the front).  Return in 6 weeks  (be sure to take all your medications)  Be sure to start using your advair. Call Dr Sheffield Slider if any problems.  -Drs. Sheffield Slider and [employer]

## 2011-03-02 ENCOUNTER — Other Ambulatory Visit: Payer: Medicare Other

## 2011-03-02 DIAGNOSIS — E785 Hyperlipidemia, unspecified: Secondary | ICD-10-CM

## 2011-03-02 DIAGNOSIS — N400 Enlarged prostate without lower urinary tract symptoms: Secondary | ICD-10-CM

## 2011-03-02 LAB — COMPREHENSIVE METABOLIC PANEL
ALT: 16 U/L (ref 0–53)
AST: 20 U/L (ref 0–37)
Albumin: 4 g/dL (ref 3.5–5.2)
Alkaline Phosphatase: 63 U/L (ref 39–117)
BUN: 8 mg/dL (ref 6–23)
CO2: 28 mEq/L (ref 19–32)
Calcium: 9.6 mg/dL (ref 8.4–10.5)
Chloride: 103 mEq/L (ref 96–112)
Creat: 0.79 mg/dL (ref 0.50–1.35)
Glucose, Bld: 111 mg/dL — ABNORMAL HIGH (ref 70–99)
Potassium: 4 mEq/L (ref 3.5–5.3)
Sodium: 141 mEq/L (ref 135–145)
Total Bilirubin: 0.6 mg/dL (ref 0.3–1.2)
Total Protein: 6.4 g/dL (ref 6.0–8.3)

## 2011-03-02 LAB — LIPID PANEL
Cholesterol: 135 mg/dL (ref 0–200)
HDL: 50 mg/dL (ref >39)
LDL Cholesterol: 64 mg/dL (ref 0–99)
Total CHOL/HDL Ratio: 2.7 Ratio
Triglycerides: 107 mg/dL (ref <150)
VLDL: 21 mg/dL (ref 0–40)

## 2011-03-02 LAB — PSA: PSA: 4.97 ng/mL — ABNORMAL HIGH (ref <=4.00)

## 2011-03-02 NOTE — Progress Notes (Signed)
DREW FASTING CMET, FLP, PSA BAJORDAN, MLS

## 2011-03-09 ENCOUNTER — Ambulatory Visit (INDEPENDENT_AMBULATORY_CARE_PROVIDER_SITE_OTHER): Payer: Medicare Other | Admitting: Family Medicine

## 2011-03-09 ENCOUNTER — Encounter: Payer: Self-pay | Admitting: Family Medicine

## 2011-03-09 DIAGNOSIS — I10 Essential (primary) hypertension: Secondary | ICD-10-CM

## 2011-03-09 DIAGNOSIS — J438 Other emphysema: Secondary | ICD-10-CM

## 2011-03-09 DIAGNOSIS — J42 Unspecified chronic bronchitis: Secondary | ICD-10-CM

## 2011-03-09 DIAGNOSIS — K5900 Constipation, unspecified: Secondary | ICD-10-CM | POA: Insufficient documentation

## 2011-03-09 DIAGNOSIS — H9190 Unspecified hearing loss, unspecified ear: Secondary | ICD-10-CM | POA: Insufficient documentation

## 2011-03-09 DIAGNOSIS — H6120 Impacted cerumen, unspecified ear: Secondary | ICD-10-CM

## 2011-03-09 DIAGNOSIS — N400 Enlarged prostate without lower urinary tract symptoms: Secondary | ICD-10-CM

## 2011-03-09 DIAGNOSIS — M25559 Pain in unspecified hip: Secondary | ICD-10-CM

## 2011-03-09 MED ORDER — POLYETHYLENE GLYCOL 3350 17 GM/SCOOP PO POWD: 17.0000 g | Freq: Every day | ORAL | Status: AC

## 2011-03-09 NOTE — Patient Instructions (Signed)
Please return in 3 months for breathing evaluation and flu shot Use the Advair twice daily regularly  For constipation use the PEG (Miralax) 1 packet in water daily and adjust dosage to keep stool soft and regularly.   I will send a letter to Dr. Doneta Public with your PSA test results.

## 2011-03-09 NOTE — Assessment & Plan Note (Signed)
Not using the Advair regularly and advised to do that. Consider PFT's after he's been on it awhile

## 2011-03-09 NOTE — Assessment & Plan Note (Signed)
Had colonoscopy last year. Will replace metamucil with PEG

## 2011-03-09 NOTE — Progress Notes (Signed)
  Subjective:    Patient ID: Jeffrey Meadows, male    DOB: 1932-09-22, 77 y.o.   MRN: [id number]  HPI He hasn't noted any improvement in nocturia, still 2-3 times nightly, on the increased Flomax. He tends to drink his wine in the late evening and doesn't restrict his fluids at supper and after.   He's been having constipation with hard stools poorly responsive to metamucil. No bleeding.   Gets "carpal tunnel" symptoms in his right hand when using the computer mouse, but doesn't want to address that today.   He continues his right hip pain.   He has been using the Advair some days and thinks that it helps him go longer on the treadmill.   His blood pressures at home are in the 140/70 range at home.     Review of Systems     Objective:   Physical Exam  Cardiovascular: Normal rate and regular rhythm.   Pulmonary/Chest: Effort normal and breath sounds normal.          Assessment & Plan:

## 2011-03-09 NOTE — Assessment & Plan Note (Signed)
Subsumed under emphysema

## 2011-03-09 NOTE — Assessment & Plan Note (Signed)
Fair control Some element of "white coat" elevation

## 2011-03-09 NOTE — Assessment & Plan Note (Signed)
A small amount of cerumen was flushed from up against the right tympanic membrane. Hard of hearing

## 2011-03-09 NOTE — Assessment & Plan Note (Signed)
No improvement on full dose Flomax. Will take time to see effects of Finasteride. PSA had increased to 4.75 Has follow up soon with Dr Mena Goes

## 2011-03-27 ENCOUNTER — Other Ambulatory Visit: Payer: Self-pay | Admitting: Family Medicine

## 2011-03-27 DIAGNOSIS — J438 Other emphysema: Secondary | ICD-10-CM

## 2011-03-27 MED ORDER — ALBUTEROL SULFATE HFA 108 (90 BASE) MCG/ACT IN AERS: 1.0000 | INHALATION_SPRAY | RESPIRATORY_TRACT | Status: DC | PRN

## 2011-04-07 ENCOUNTER — Other Ambulatory Visit: Payer: Self-pay | Admitting: Family Medicine

## 2011-04-07 DIAGNOSIS — J438 Other emphysema: Secondary | ICD-10-CM

## 2011-04-07 MED ORDER — FLUTICASONE-SALMETEROL 500-50 MCG/DOSE IN AEPB: 1.0000 | INHALATION_SPRAY | Freq: Two times a day (BID) | RESPIRATORY_TRACT | Status: DC

## 2011-04-11 ENCOUNTER — Ambulatory Visit: Payer: Medicare Other | Admitting: Home Health Services

## 2011-04-28 ENCOUNTER — Ambulatory Visit (INDEPENDENT_AMBULATORY_CARE_PROVIDER_SITE_OTHER): Payer: Medicare Other | Admitting: *Deleted

## 2011-04-28 DIAGNOSIS — Z23 Encounter for immunization: Secondary | ICD-10-CM

## 2011-06-04 ENCOUNTER — Other Ambulatory Visit: Payer: Self-pay | Admitting: Family Medicine

## 2011-06-04 DIAGNOSIS — I10 Essential (primary) hypertension: Secondary | ICD-10-CM

## 2011-06-05 NOTE — Telephone Encounter (Signed)
Refill request

## 2011-06-12 ENCOUNTER — Other Ambulatory Visit: Payer: Self-pay | Admitting: Family Medicine

## 2011-06-12 DIAGNOSIS — J438 Other emphysema: Secondary | ICD-10-CM

## 2011-06-12 MED ORDER — GUAIFENESIN-CODEINE 100-10 MG/5ML PO SYRP: 5.0000 mL | ORAL_SOLUTION | Freq: Three times a day (TID) | ORAL | Status: AC | PRN

## 2011-10-19 ENCOUNTER — Ambulatory Visit (INDEPENDENT_AMBULATORY_CARE_PROVIDER_SITE_OTHER): Payer: Medicare Other | Admitting: Sports Medicine

## 2011-10-19 DIAGNOSIS — M25559 Pain in unspecified hip: Secondary | ICD-10-CM

## 2011-10-19 DIAGNOSIS — M25552 Pain in left hip: Secondary | ICD-10-CM | POA: Insufficient documentation

## 2011-10-19 DIAGNOSIS — R269 Unspecified abnormalities of gait and mobility: Secondary | ICD-10-CM

## 2011-10-19 NOTE — Assessment & Plan Note (Signed)
I added arch supports to one pair shoes  He is to replace his current walking shoes  We are able to control his pronation with simple arch supports

## 2011-10-19 NOTE — Assessment & Plan Note (Signed)
I think he has favored the left hip for some time because of the right hip replacement  He seems to have more muscular injury based on exam then osteoarthritis We will check an AP of the pelvis to assess the degree of arthritis  Begin a standard hip exercise program with particular attention to abduction  Continue aspirin  Recheck in 6 weeks

## 2011-10-19 NOTE — Patient Instructions (Addendum)
May use aspirin (650 mg) every 4-6 hours for pain.    Please practice exercises as indicated on handout daily.    Please get your x-rays done at [facility].

## 2011-10-19 NOTE — Assessment & Plan Note (Signed)
The right hip pain seems pretty well controlled at present  He does a good walking program  Uses aspirin as needed for pain

## 2011-10-19 NOTE — Progress Notes (Signed)
Subjective:    Patient ID: Jeffrey Meadows is a 78 y.o. male.  Chief Complaint: HPI Hip Pain Patient complains of left hip pain, mostly posterior-lateral.  Denies groin pain.  He has had problems with his left hip over the past several months and has been treated with aspirin.  Pain worsens with walking ~ 1/2 mile.  Pain has progressed significantly over  2 monthsto the point where the patient has 4 out of 10 hip pain with activity. Patient does not have rest/ night time pain.  Traumatic right hip injury at 78 yo, possible fracture that was corrected with surgery.    We have added arch. supports to his shoes to correct the pronation in his gait and this helped     Social History   Occupational History  . 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 Smoking status: Former Smoker    Types: Cigarettes    Quit date: 07/30/1986  . Smokeless tobacco: Not on file  . Alcohol Use: 12.0 - 21.0 oz/week    20-35 Glasses of wine per week     3-5 glasses of wine daily, lost license DUI  . Drug Use: Not on file  . Sexually Active: Not on file    ROS       Objective:   Ortho Exam  No acute distress Walking shows that he does not wish all fully with the left hip Right hip function is very good  Range of motion of the right is 20 internal rotation and 30 external rotation while seated Range of motion on the left is 25 internal rotation and 35 external rotation seated  Lying in a 90 hip flexion position he gets some increase in rotational motion of both hips  Left hip shows weakness with abduction but has good flexion extension and rotation strength Right hip shows normal strength in all tests   Assessment:   Plan:

## 2011-10-27 ENCOUNTER — Telehealth: Payer: Self-pay | Admitting: Family Medicine

## 2011-10-27 NOTE — Telephone Encounter (Signed)
Wife is wanting to speak to Dr. Sheffield Slider about Jeffrey Meadows allergy problems.  What can he take over the counter with the meds he is on.

## 2011-10-27 NOTE — Telephone Encounter (Signed)
Called pt's wife. Pharmacist recommended Allegra OTC. She wanted to know, if this was safe for pt to take. (pt has enlarged prostate). I went over the medications and asked Dr.Jordan for advise. Per Dr.Jordan Allegra is safe for pt to take. Informed pt and pt's wife. Fwd. To Dr.Hale for info. Lorenda Hatchet, Renato Battles

## 2011-10-30 ENCOUNTER — Ambulatory Visit: Payer: Medicare Other | Admitting: Sports Medicine

## 2011-11-07 ENCOUNTER — Ambulatory Visit
Admission: RE | Admit: 2011-11-07 | Discharge: 2011-11-07 | Disposition: A | Discharge status: Home / Self Care | Payer: Medicare Other | Source: Ambulatory Visit | Attending: Sports Medicine | Admitting: Sports Medicine

## 2011-11-07 DIAGNOSIS — M25552 Pain in left hip: Secondary | ICD-10-CM

## 2011-11-20 ENCOUNTER — Encounter: Payer: Self-pay | Admitting: Neurosurgery

## 2011-11-21 ENCOUNTER — Encounter: Payer: Self-pay | Admitting: Neurosurgery

## 2011-11-21 ENCOUNTER — Ambulatory Visit (INDEPENDENT_AMBULATORY_CARE_PROVIDER_SITE_OTHER): Payer: Medicare Other | Admitting: *Deleted

## 2011-11-21 ENCOUNTER — Encounter (INDEPENDENT_AMBULATORY_CARE_PROVIDER_SITE_OTHER): Payer: Medicare Other | Admitting: *Deleted

## 2011-11-21 ENCOUNTER — Ambulatory Visit (INDEPENDENT_AMBULATORY_CARE_PROVIDER_SITE_OTHER): Payer: Medicare Other | Admitting: Neurosurgery

## 2011-11-21 VITALS — BP 149/80 | HR 66 | Resp 16 | Ht 68.0 in | Wt 168.6 lb

## 2011-11-21 DIAGNOSIS — I739 Peripheral vascular disease, unspecified: Secondary | ICD-10-CM

## 2011-11-21 DIAGNOSIS — Z48812 Encounter for surgical aftercare following surgery on the circulatory system: Secondary | ICD-10-CM

## 2011-11-21 NOTE — Progress Notes (Signed)
[facility] [place] HISTORY AND PHYSICAL   CC: Annual lower arterial duplex study Referring Physician: Hart Rochester  History of Present Illness: 45 [address] patient of Dr. Hart Rochester seen for her annual lower serial duplex evaluation of a left distal SFA popliteal artery bypass graft that was completed in March 2006 by Dr. Hart Rochester. Patient reports no change in his medical history since his last visit here he has no signs of claudication he has no rest pain no open wounds in his lower extremities  Past Medical History  Diagnosis Date  . Villous adenoma of colon 11/.2001    2 cm removed cecum  . Adenomatous polyp of colon 08/2001    removed hepatic flexure  . Hypertension   . Hyperlipidemia   . Raynaud disease     ROS: [x]  Positive   [ ]  Denies    General: [ ]  Weight loss, [ ]  Fever, [ ]  chills Neurologic: [ ]  Dizziness, [ ]  Blackouts, [ ]  Seizure [ ]  Stroke, [ ]  "Mini stroke", [ ]  Slurred speech, [ ]  Temporary blindness; [ ]  weakness in arms or legs, [ ]  Hoarseness Cardiac: [ ]  Chest pain/pressure, [ ]  Shortness of breath at rest [ ]  Shortness of breath with exertion, [ ]  Atrial fibrillation or irregular heartbeat Vascular: [ ]  Pain in legs with walking, [ ]  Pain in legs at rest, [ ]  Pain in legs at night,  [ ]  Non-healing ulcer, [ ]  Blood clot in vein/DVT,   Pulmonary: [ ]  Home oxygen, [ ]  Productive cough, [ ]  Coughing up blood, [ ]  Asthma,  [ ]  Wheezing Musculoskeletal:  [ ]  Arthritis, [ ]  Low back pain, [ ]  Joint pain Hematologic: [ ]  Easy Bruising, [ ]  Anemia; [ ]  Hepatitis Gastrointestinal: [ ]  Blood in stool, [ ]  Gastroesophageal Reflux/heartburn, [ ]  Trouble swallowing Urinary: [ ]  chronic Kidney disease, [ ]  on HD - [ ]  MWF or [ ]  TTHS, [ ]  Burning with urination, [ ]  Difficulty urinating Skin: [ ]  Rashes, [ ]  Wounds Psychological: [ ]  Anxiety, [ ]  Depression   Social History History  Substance Use Topics  . Smoking status: Former Smoker    Types: Cigarettes   Quit date: 07/30/1986  . Smokeless tobacco: Not on file  . Alcohol Use: 12.0 - 21.0 oz/week    20-35 Glasses of wine per week     3-5 glasses of wine daily, lost license DUI    Family History Family History  Problem Relation Age of Onset  . Heart failure Mother   . Heart disease Mother   . Aneurysm Father     No Known Allergies  Current Outpatient Prescriptions  Medication Sig Dispense Refill  . albuterol (PROAIR HFA) 108 (90 BASE) MCG/ACT inhaler Inhale 1-2 puffs into the lungs every 4 (four) hours as needed. as needed shortness of breath prior to exercise  1 Inhaler  11  . amLODipine (NORVASC) 10 MG tablet Take 1 tablet (10 mg total) by mouth daily.  90 tablet  3  . aspirin 325 MG tablet Take 325 mg by mouth daily.        Marland Kitchen atorvastatin (LIPITOR) 20 MG tablet Take 1 tablet (20 mg total) by mouth daily.  90 tablet  3  . calcipotriene (DOVONOX) 0.005 % ointment Apply 1 application topically.      . ELOCON 0.1 % lotion       . finasteride (PROSCAR) 5 MG tablet Take 1 tablet (5 mg total) by mouth daily.  90 tablet  3  . fish oil-omega-3 fatty acids 1000 MG capsule Take 1 capsule by mouth daily.        . fluticasone (FLONASE) 50 MCG/ACT nasal spray Place 2 sprays into the nose daily.  16 g  12  . Fluticasone-Salmeterol (ADVAIR DISKUS) 500-50 MCG/DOSE AEPB Inhale 1 puff into the lungs 2 (two) times daily.  60 each  11  . Glucosamine-Chondroitin 1500-1200 MG/30ML LIQD Take 3 tablets by mouth daily.        . hydrochlorothiazide (HYDRODIURIL) 25 MG tablet TAKE ONE TABLET BY MOUTH IN THE MORNING  90 tablet  3  . Tamsulosin HCl (FLOMAX) 0.4 MG CAPS Take 2 capsules (0.8 mg total) by mouth daily.  60 capsule  11  . therapeutic multivitamin-minerals (THERAGRAN-M) tablet Take 1 tablet by mouth daily.        . Calcitriol (VECTICAL) 3 MCG/GM cream Apply topically 2 (two) times daily as needed.          Physical Examination  Filed Vitals:   11/21/11 1108  BP: 149/80  Pulse: 66  Resp: 16     Body mass index is 25.64 kg/(m^2).  General:  WDWN in NAD Gait: Normal HEENT: WNL Eyes: Pupils equal Pulmonary: normal non-labored breathing , without Rales, rhonchi,  wheezing Cardiac: RRR, without  Murmurs, rubs or gallops; No carotid bruits Abdomen: soft, NT, no masses Skin: no rashes, ulcers noted Vascular Exam/Pulses: She has 2+ bilateral radial pulses. DP and PT are 1+ bilaterally in the lower extremities  Extremities without ischemic changes, no Gangrene , no cellulitis; no open wounds;  Musculoskeletal: no muscle wasting or atrophy  Neurologic: A&O X 3; Appropriate Affect ; SENSATION: normal; MOTOR FUNCTION:  moving all extremities equally. Speech is fluent/normal  Non-Invasive Vascular Imaging: Lower extremity ABIs today are 0.94 on the right 1.28 on the left previous ABIs in April 2012 the right was 1.02 left is 1.28, he has triphasic flow the right lower extremity triphasic for flow in the left posterior tibial biphasic in the dorsalis pedis. Lower extremity graft scan does not show how with elevated velocities in the midportion of left lower extremity does show velocities of 247 and 230  ASSESSMENT/PLAN: Assessment as stated above, plan since the patient is asymptomatic and having no difficulty she'll return in one year for repeat lower extremity ABIs his questions were encouraged and answered he is in agreement with this plan. Next  Lauree Chandler ANP  Clinic M.D.: Hart Rochester

## 2011-11-21 NOTE — Progress Notes (Signed)
Addended by: Sharee Pimple on: 11/21/2011 01:55 PM   Modules accepted: Orders

## 2011-11-22 ENCOUNTER — Other Ambulatory Visit: Payer: Self-pay | Admitting: Family Medicine

## 2011-11-22 NOTE — Telephone Encounter (Signed)
Please call Jeffrey Meadows at earliest convenience.  Today preferably.  Want to know if he can take the Allegra every 12hrs instead of 24 and Ciallis.  Read in the paper that people with prostate issues should check with physician before taking.  Need advise about that.

## 2011-11-23 MED ORDER — TADALAFIL 2.5 MG PO TABS: 2.5000 mg | ORAL_TABLET | Freq: Every day | ORAL | Status: DC

## 2011-11-23 MED ORDER — FLUTICASONE PROPIONATE 50 MCG/ACT NA SUSP: 2.0000 | Freq: Every day | NASAL | Status: DC

## 2011-11-23 MED ORDER — TADALAFIL 5 MG PO TABS: 5.0000 mg | ORAL_TABLET | Freq: Every day | ORAL | Status: DC

## 2011-11-23 NOTE — Telephone Encounter (Signed)
He has had severe nasal congestion since last November despite taking Allegra daily. He's also been using Afrin nasal spray 2-3 times daily not realizing that it is labeled not to use more than a few days. He doesn't have any Flonase and hasn't used it for some time.   Read that Cialis can help BPH symptoms and would like to try that since he is already taking finesteride and tamsulosin. I will send him a IPSS to complete as a baseline for the BPH symptoms.   Letter printed and sent with the IPSS

## 2011-11-23 NOTE — Telephone Encounter (Signed)
I called for preauthorization of Cialis for BPH and it was approved until 11/22/2012. Approval placed in scan box.

## 2011-11-24 ENCOUNTER — Telehealth: Payer: Self-pay | Admitting: *Deleted

## 2011-11-24 NOTE — Telephone Encounter (Signed)
Approval received from insurance  for Cialis. Pharmacy notified.

## 2011-12-04 ENCOUNTER — Encounter: Payer: Self-pay | Admitting: Family Medicine

## 2011-12-05 NOTE — Procedures (Unsigned)
BYPASS GRAFT EVALUATION  INDICATION:  Followup left bypass graft  HISTORY: Diabetes:  No Cardiac:  No Hypertension:  Yes Smoking:  Previous Previous Surgery:  Left distal superficial femoral to popliteal artery bypass graft on 10/14/2004  SINGLE LEVEL ARTERIAL EXAM                              RIGHT              LEFT Brachial: Anterior tibial: Posterior tibial: Peroneal: Ankle/brachial index:        0.98               1.28  PREVIOUS ABI:  Date:  RIGHT:  LEFT:  LOWER EXTREMITY BYPASS GRAFT DUPLEX EXAM:  DUPLEX:  Biphasic Doppler waveforms noted throughout the left lower extremity bypass graft with no increase in velocities.  There is a velocity of 247 cm/s noted in the left distal superficial femoral artery.  IMPRESSION: 1. Patent left lower extremity bypass graft with a velocity of 247     cm/s noted in the left distal superficial femoral artery. 2. Bilateral ankle brachial indices are within normal limits and     stable in comparison to the previous exam.  ___________________________________________ Quita Skye. Hart Rochester, M.D.  EM/MEDQ  D:  11/21/2011  T:  11/21/2011  Job:  [id number]

## 2011-12-07 ENCOUNTER — Encounter: Payer: Self-pay | Admitting: Family Medicine

## 2011-12-26 ENCOUNTER — Encounter: Payer: Self-pay | Admitting: Family Medicine

## 2011-12-26 ENCOUNTER — Ambulatory Visit (INDEPENDENT_AMBULATORY_CARE_PROVIDER_SITE_OTHER): Payer: Medicare Other | Admitting: Family Medicine

## 2011-12-26 DIAGNOSIS — T485X5A Adverse effect of other anti-common-cold drugs, initial encounter: Secondary | ICD-10-CM

## 2011-12-26 DIAGNOSIS — J309 Allergic rhinitis, unspecified: Secondary | ICD-10-CM

## 2011-12-26 DIAGNOSIS — J310 Chronic rhinitis: Secondary | ICD-10-CM

## 2011-12-26 DIAGNOSIS — J3489 Other specified disorders of nose and nasal sinuses: Secondary | ICD-10-CM

## 2011-12-26 NOTE — Patient Instructions (Signed)
Good to see you today  We will arrange consultation with Dr Dillard Cannon, ENT

## 2011-12-26 NOTE — Assessment & Plan Note (Signed)
Poor response to Flonase. ENT consult to rule out underlying septal deviation or other problem that caused the need to use the Afrin

## 2011-12-26 NOTE — Progress Notes (Signed)
  Subjective:    Patient ID: Mackie Pai, male    DOB: 01/04/1933, 78 y.o.   MRN: [id number]  HPI Nasal congestion since he stopped using Afrin hasn't responded to Flonase so he stopped using that. Does sneeze. Mild itch in throat. He stopped Allegra. 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  Feels that he has DOE despite using Advair and Albuterol, but can waslk 3/4 of a mile.   Has been taking Aspirin as pain reliever, but recommended taking one daily and using Acetaminophen for pain.   Review of Systems     Objective:   Physical Exam  Constitutional:       Appears miserable from nasal congestion  HENT:  Right Ear: External ear normal.  Mouth/Throat: Oropharynx is clear and moist. No oropharyngeal exudate.       left ear opaque, but not red.  Nose with pale edematous mucosa. right side obstructed  Cardiovascular: Normal rate and regular rhythm.   Pulmonary/Chest: Effort normal and breath sounds normal.          Assessment & Plan:

## 2012-01-20 ENCOUNTER — Other Ambulatory Visit: Payer: Self-pay | Admitting: Family Medicine

## 2012-01-23 ENCOUNTER — Other Ambulatory Visit: Payer: Self-pay | Admitting: Family Medicine

## 2012-01-24 ENCOUNTER — Other Ambulatory Visit: Payer: Self-pay | Admitting: *Deleted

## 2012-01-24 ENCOUNTER — Ambulatory Visit: Payer: Medicare Other | Admitting: Sports Medicine

## 2012-01-24 ENCOUNTER — Telehealth: Payer: Self-pay | Admitting: *Deleted

## 2012-01-24 NOTE — Telephone Encounter (Signed)
Mrs. Ravenscroft is calling on behalf of her husband, Jeffrey Meadows, to request a prescription for knee high compression hose as his current pair is no longer effective.  Faxed in prescription for knee-high, open toed compression stockings 20-30 mm Hg to [facility] per Mrs. Diffley's request.

## 2012-01-24 NOTE — Telephone Encounter (Addendum)
Pharmacy requesting refill of Lipitor. Dr. Sheffield Slider not in office.  Will route request to Dr. Earnest Bailey.  Gaylene Brooks, RN

## 2012-01-26 NOTE — Telephone Encounter (Signed)
Rx has already been filled per pharmacy.  Gaylene Brooks, RN

## 2012-01-26 NOTE — Telephone Encounter (Signed)
A year supply was ordered by Dr. Sheffield Slider on 6/12.  Please confirm with pharmacy the received this, ok to verbal order this for Dr. Sheffield Slider per his documentation if they did not.

## 2012-02-13 ENCOUNTER — Encounter: Payer: Self-pay | Admitting: Sports Medicine

## 2012-02-13 ENCOUNTER — Ambulatory Visit (INDEPENDENT_AMBULATORY_CARE_PROVIDER_SITE_OTHER): Payer: Medicare Other | Admitting: Sports Medicine

## 2012-02-13 VITALS — BP 124/71 | HR 60

## 2012-02-13 DIAGNOSIS — M25559 Pain in unspecified hip: Secondary | ICD-10-CM

## 2012-02-13 DIAGNOSIS — M25552 Pain in left hip: Secondary | ICD-10-CM

## 2012-02-13 NOTE — Progress Notes (Signed)
  Subjective:    Patient ID: Jeffrey Meadows, male    DOB: [birth year], 78 y.o.   MRN: [id number]  HPI  Pt presents to clinic for f/u of lt hip pain which he states is slightly improved.  Has been compliant with suggested hip exercises. No longer has pain when starting walks, pain begins about 0.5 mile into walk.  Takes aspirin for pain which is helpful.   He thinks hip abduction helps as does hip rotation Has not done step exercises and has not found a good place to do this  Now able to walk about 1/2 mile and then gets pain - this is 2 to 3x increase Pain now remains mild to moderate     Review of Systems     Objective:   Physical Exam NAD Lt hip rotation excellent, rt almost as good FABER good rt > lt  Lt hip abduction still weak Rt hip abduction stronger than lt Hip rotation and flexion good bilat strength Hip ROM bilat is normal to flexion and extension          Assessment & Plan:

## 2012-02-13 NOTE — Assessment & Plan Note (Signed)
Cont to work on strength  Add theraband in loop to work on abduction  Keep up walking but keep level low while doing rehab  If not imporved in 2 mos recheck

## 2012-02-13 NOTE — Assessment & Plan Note (Signed)
This has improved more than left and his strength is better  Keep up some of exercises for RT as well  Hip joints do not show significant DJD which is reassuring

## 2012-02-13 NOTE — Patient Instructions (Addendum)
Continue side leg lifts and standing hip rotations   Add side walking with theraband to the left and right  Please follow up in 2 months if you have not improved enough  Thank you for seeing [place] today!

## 2012-05-03 ENCOUNTER — Encounter: Payer: Self-pay | Admitting: Family Medicine

## 2012-05-09 ENCOUNTER — Encounter: Payer: Self-pay | Admitting: Sports Medicine

## 2012-05-09 ENCOUNTER — Ambulatory Visit (INDEPENDENT_AMBULATORY_CARE_PROVIDER_SITE_OTHER): Payer: Medicare Other | Admitting: Sports Medicine

## 2012-05-09 VITALS — BP 149/80 | HR 75 | Ht 68.0 in | Wt 162.0 lb

## 2012-05-09 DIAGNOSIS — M25559 Pain in unspecified hip: Secondary | ICD-10-CM

## 2012-05-09 DIAGNOSIS — G8929 Other chronic pain: Secondary | ICD-10-CM | POA: Insufficient documentation

## 2012-05-09 DIAGNOSIS — M25552 Pain in left hip: Secondary | ICD-10-CM

## 2012-05-09 DIAGNOSIS — M79609 Pain in unspecified limb: Secondary | ICD-10-CM

## 2012-05-09 DIAGNOSIS — M79673 Pain in unspecified foot: Secondary | ICD-10-CM

## 2012-05-09 NOTE — Assessment & Plan Note (Signed)
New problem today Bilateral arch pain secondary to cavus foot. Plan for scaphoid pads and shoes bilaterally. Followup in one month. May ultimately benefit from custom orthotics

## 2012-05-09 NOTE — Progress Notes (Signed)
Rodriquez Thorner is a 78 y.o. male who presents to [facility] today for bilateral hip pain. Stable to slightly improved following abduction exercises. Patient was previously diagnosed with greater trochanteric bursitis. He notes the pain is mild to moderate.   pain worsens if he does not do his abduction exercises. Additionally he notes pain after walking a quarter of a mile or so. His pain is not bothersome enough to get a shot today.  He's not taking much for his pain.  Additionally he notes bilateral arch pain after exercise.  He denies any heel pain or first step pain.  He notes that he has high arches.  No radiating pain weakness or numbness.    PMH reviewed. Significant for femoral fracture of the right History  Substance Use Topics  . Smoking status: Former Smoker    Types: Cigarettes    Quit date: 07/30/1986  . Smokeless tobacco: Not on file  . Alcohol Use: 12.0 - 21.0 oz/week    20-35 Glasses of wine per week     3-5 glasses of wine daily, lost license DUI   ROS as above otherwise neg   Exam:  BP 149/80  Pulse 75  Ht 5\' 8"  (1.727 m)  Wt 162 lb (73.483 kg)  BMI 24.63 kg/m2 Gen: Well NAD MSK: Bilateral hips: Mildly tender over the bilateral greater trochanter. Normal hip range of motion. Strength is intact however diminished on abduction.    Feet: Cavus appearing feet bilaterally.   No significant arch breakdown. Normal heel varus on toes standing.   Gait is preserved.

## 2012-05-09 NOTE — Assessment & Plan Note (Signed)
Bilateral greater trochanteric hip bursitis. Mild to moderate. Not severe enough per patient for corticosteroid injection today. Plan to continue hip abduction exercises. Will experiment with nitroglycerin protocol for a week or 2 to see if it helps. Followup in a month or so.  Discussed warning signs or symptoms. Please see discharge instructions. Patient expresses understanding.  

## 2012-05-09 NOTE — Assessment & Plan Note (Signed)
Bilateral greater trochanteric hip bursitis. Mild to moderate. Not severe enough per patient for corticosteroid injection today. Plan to continue hip abduction exercises. Will experiment with nitroglycerin protocol for a week or 2 to see if it helps. Followup in a month or so.  Discussed warning signs or symptoms. Please see discharge instructions. Patient expresses understanding.

## 2012-05-09 NOTE — Patient Instructions (Addendum)
Thank you for coming in today. Try those pads on the insoles.  Please also try a 1/4 nitro patch on one of the hips daily for a week or so to see if it will help.  Please come back in a few weeks to months.   Nitroglycerin Protocol   Apply 1/4 nitroglycerin patch to affected area daily.  Change position of patch within the affected area every 24 hours.  You may experience a headache during the first 1-2 weeks of using the patch, these should subside.  If you experience headaches after beginning nitroglycerin patch treatment, you may take your preferred over the counter pain reliever.  Another side effect of the nitroglycerin patch is skin irritation or rash related to patch adhesive.  Please notify our office if you develop more severe headaches or rash, and stop the patch.  Tendon healing with nitroglycerin patch may require 12 to 24 weeks depending on the extent of injury.  Men should not use if taking Viagra, Cialis, or Levitra.   Do not use if you have migraines or rosacea.

## 2012-05-17 ENCOUNTER — Ambulatory Visit (INDEPENDENT_AMBULATORY_CARE_PROVIDER_SITE_OTHER): Payer: Medicare Other | Admitting: Home Health Services

## 2012-05-17 ENCOUNTER — Encounter: Payer: Self-pay | Admitting: Home Health Services

## 2012-05-17 ENCOUNTER — Ambulatory Visit (INDEPENDENT_AMBULATORY_CARE_PROVIDER_SITE_OTHER): Payer: Medicare Other | Admitting: *Deleted

## 2012-05-17 VITALS — BP 133/77 | HR 70 | Temp 97.7°F | Ht 68.0 in | Wt 168.5 lb

## 2012-05-17 DIAGNOSIS — Z0000 Encounter for general adult medical examination without abnormal findings: Secondary | ICD-10-CM

## 2012-05-17 DIAGNOSIS — Z23 Encounter for immunization: Secondary | ICD-10-CM

## 2012-05-17 NOTE — Progress Notes (Signed)
Patient here for annual wellness visit, patient reports: Risk Factors/Conditions needing evaluation or treatment: Pt does not have any new risk factors that need evaluation. Home Safety: Pt lives with wife, 3 story home.  Pt reports having smoke detectors and adaptive equipment. Other Information: Corrective lens: Pt wears corrective lens for reading/driving.  Reports annual eye exams. Dentures: Pt does not have dentures or partials. Reports annual dental exams. Memory: Pt denies memory problems. Patient's Mini Mental Score (recorded in doc. flowsheet): 30 Pt reports having recent hearing exams.  Recalls a 30% hearing lost, not recommended for hearing aids.  Balance/Gait: Pt does not have any noticeable impairment. Balance Abnormal Patient value  Sitting balance    Sit to stand    Attempts to arise    Immediate standing balance    Standing balance    Nudge    Eyes closed- Romberg    Tandem stance    Back lean    Neck Rotation    360 degree turn    Sitting down     Gait Abnormal Patient value  Initiation of gait    Step length-left    Step length-right    Step height-left    Step height-right    Step symmetry    Step continuity    Path deviation    Trunk movement    Walking stance        Annual Wellness Visit Requirements Recorded Today In  Medical, family, social history Past Medical, Family, Social History Section  Current providers Care team  Current medications Medications  Wt, BP, Ht, BMI Vital signs  Hearing assessment (welcome visit) Progress note  Tobacco, alcohol, illicit drug use History  ADL Nurse Assessment  Depression Screening Nurse Assessment  Cognitive impairment Nurse Assessment  Mini Mental Status Document Flowsheet  Fall Risk Nurse Assessment  Home Safety Progress Note  End of Life Planning (welcome visit) Social Documentation  Medicare preventative services Progress Note  Risk factors/conditions needing evaluation/treatment Progress Note    Personalized health advice Patient Instructions, goals, letter  Diet & Exercise Social Documentation  Emergency Contact Social Documentation  Seat Belts Social Documentation  Sun exposure/protection Social Documentation    Medicare Prevention Plan:   Recommended Medicare Prevention Screenings Men over 65 Test For Frequency Date of Last- BOLD if needed  Colorectal Cancer 1-10 yrs 3/11  Prostate Cancer Never or yearly NI  Aortic Aneurysm Once if 65-75 with hx of smoking NI-due to age  Cholesterol 5 yrs 7/12  Diabetes yearly 7/12  HIV yearly declined  Influenza Shot yearly 10/13  Pneumonia Shot once Not a candidate  Zostavax Shot once 2/08   I have reviewed this visit and discussed with Arlys John and agree with her documentation. Wayne A. Sheffield Slider, MD

## 2012-05-17 NOTE — Patient Instructions (Signed)
1. Continue exercising 3-4 times a week.  Goal of 150 minutes per week. 2. Continue healthy diet, including fruits, vegetables, and lean protein. 3. Consider reducing daily alcohol amount.

## 2012-05-19 ENCOUNTER — Encounter: Payer: Self-pay | Admitting: Home Health Services

## 2012-05-26 ENCOUNTER — Other Ambulatory Visit: Payer: Self-pay | Admitting: Family Medicine

## 2012-05-26 NOTE — Telephone Encounter (Signed)
No more refills until labs done

## 2012-07-09 ENCOUNTER — Telehealth: Payer: Self-pay | Admitting: Family Medicine

## 2012-07-09 NOTE — Telephone Encounter (Signed)
Last OV with Dr.Hale (5/13). Will ask Dr.Hale for advise. Lorenda Hatchet, Renato Battles

## 2012-07-09 NOTE — Telephone Encounter (Signed)
Wife is calling for a recommendation for a Pulminologist.

## 2012-07-10 ENCOUNTER — Telehealth: Payer: Self-pay | Admitting: Family Medicine

## 2012-07-10 NOTE — Telephone Encounter (Signed)
I spoke with Jeffrey Meadows's wife who said he had 2 episodes of choking while eating and had to leave the the table to clear his esophagus. He's had to mouth breathe and was having problems breathing in the cold dry air, but improved when it started raining. He doesn't have GERD symptoms which she is familiar with since she's had several esophageal dilatations for stricture. They have had a mold problem and run a dehumidifier in the basement. I recommended a humidistat in their bedroom. I recommended an office visit for me to sort the problems out. She said she will schedule one.

## 2012-07-15 ENCOUNTER — Other Ambulatory Visit: Payer: Self-pay | Admitting: *Deleted

## 2012-07-15 MED ORDER — FLUTICASONE-SALMETEROL 500-50 MCG/DOSE IN AEPB: 1.0000 | INHALATION_SPRAY | Freq: Two times a day (BID) | RESPIRATORY_TRACT | Status: DC

## 2012-07-16 ENCOUNTER — Ambulatory Visit (INDEPENDENT_AMBULATORY_CARE_PROVIDER_SITE_OTHER): Payer: Medicare Other | Admitting: Family Medicine

## 2012-07-16 ENCOUNTER — Encounter: Payer: Self-pay | Admitting: Family Medicine

## 2012-07-16 VITALS — BP 145/67 | HR 79 | Temp 98.2°F | Ht 68.0 in | Wt 167.0 lb

## 2012-07-16 DIAGNOSIS — J342 Deviated nasal septum: Secondary | ICD-10-CM

## 2012-07-16 DIAGNOSIS — J438 Other emphysema: Secondary | ICD-10-CM

## 2012-07-16 DIAGNOSIS — J309 Allergic rhinitis, unspecified: Secondary | ICD-10-CM

## 2012-07-16 NOTE — Patient Instructions (Addendum)
We will make a referral to Dr Fannie Knee and you can schedule with him in January. I'll ask him to consider the contribution of allergies.   Use Afrin intermittently for no longer than 3 days every 2 weeks.   Continue Flonase nasal steroid spray   Continue your Advair the same  Schedule fasting labs when convenient

## 2012-07-16 NOTE — Progress Notes (Signed)
  Subjective:    Patient ID: Jeffrey Meadows, male    DOB: 1932/12/02, 78 y.o.   MRN: [id number]  HPI He primarily has continued problems breathing through his right nare, which Dr Ezzard Standing had attributed to a septum deviated to that side. It previously responded to Afrin until he got rebound problems. Flonase stopped working. He never got the nose spray that combined that with Astelin. In the cold months, dry air, like that in the care, may his congestion worse. He has difficulty talking, eating and breathing at the same time, and sometimes has to leave the table to hack up mucus so he can breathe better. He's never had allergy testing. Allegra helps. No sneezing.   He continues Advair and doesn't wheeze, except with exertion such as climbing steps. He uses his Albuterol MDI to prevent that. His hip arthritis prevents walking far enough to get dyspnea on exertion.   Review of Systems     Objective:   Physical Exam  HENT:  Right Ear: External ear normal.  Left Ear: External ear normal.       R nare occluded by deviated septum  Cardiovascular: Normal rate and regular rhythm.   Pulmonary/Chest: Effort normal. No respiratory distress. He has no wheezes. He has no rales.       Distant breath sounds.   Lymphadenopathy:    He has no cervical adenopathy.  Neurological: He is alert.  Skin:       Psoriatic lesions and areas of skin atrophy  Psychiatric: He has a normal mood and affect. His behavior is normal.          Assessment & Plan:

## 2012-07-16 NOTE — Assessment & Plan Note (Signed)
Well controlled. Will refer to Jeffrey Meadows who he would like to see after the holidays.

## 2012-07-16 NOTE — Assessment & Plan Note (Signed)
Continue Allegra, retry Flonase. Afrin for short courses of no more than 3 days every 2 weeks. Humidify air in room where he sleeps.

## 2012-07-22 ENCOUNTER — Telehealth: Payer: Self-pay | Admitting: *Deleted

## 2012-07-22 NOTE — Telephone Encounter (Signed)
Called and informed patient of appointment with Dr Jetty Duhamel on 08/29/12 at 9:45.Busick, Rodena Medin

## 2012-07-31 ENCOUNTER — Ambulatory Visit (INDEPENDENT_AMBULATORY_CARE_PROVIDER_SITE_OTHER): Payer: Medicare Other | Admitting: Sports Medicine

## 2012-07-31 VITALS — BP 126/62 | Ht 68.0 in | Wt 160.0 lb

## 2012-07-31 DIAGNOSIS — M25559 Pain in unspecified hip: Secondary | ICD-10-CM

## 2012-07-31 DIAGNOSIS — G8929 Other chronic pain: Secondary | ICD-10-CM

## 2012-07-31 NOTE — Patient Instructions (Signed)
It was good to see you today! Your hips look great.  To help build muscle strength, take 2-5 grams of Creatine daily. You can get this at [facility] or other vitamin stores. Try that for one month and see how you feel.  Let us know if we can do anything else for you! Merry Christmas!

## 2012-07-31 NOTE — Progress Notes (Signed)
  Subjective:    Patient ID: Jeffrey Meadows, male    DOB: [birth year], 78 y.o.   MRN: [id number]  HPI  Pt is a 78 yo M follow up for bilateral hip pain. Patient has been evaluated in the past. At last visit he was given nitroglycerin patches to try, but he was unable to tolerate the patch due to his psoriasis and worsening skin with patch.  He states his hips are "about the same." He can walk 1/4 mile before he needs to rest. He had hip surgery on the right in 1951. Pain is worst with prolonged standing or walking long distances. He believes the pain in his hips comes on when he is starting to get SOB from and element of COPD.  No calf pain to suggest claudication.  He states the pain is lateral, but denies weakness/numbness/tingling or tenderness to palpation of hip. He takes 3 aspirin per day which controls his pain well.   Review of Systems + dyspnea secondary to emphysema    Objective:   Physical Exam  Gen: Awake, alert, NAD. Very pleasant Hip: No TTP bilaterally. ROM 60 degrees on right, 65 on left. Flexion 110 degrees. Good adduction and abduction Strength bilaterally 5/5 IR, ER, Flexion, Extension.  Pelvic alignment unremarkable to inspection and palpation. Gait normal without trendelenburg     Assessment & Plan:

## 2012-07-31 NOTE — Assessment & Plan Note (Addendum)
Hip with minimal arthritic changes on exam today. Hip pain likely secondary to muscle fatigue from decreased oxygenation. Will try Creatine supplement 2-5g daily for one month. Will continue small exercises and Aspirin as needed for pain. Return as needed  Creatinine supplementation did help his strength significantly he can continue this

## 2012-08-23 ENCOUNTER — Other Ambulatory Visit: Payer: Self-pay | Admitting: Family Medicine

## 2012-08-29 ENCOUNTER — Ambulatory Visit (INDEPENDENT_AMBULATORY_CARE_PROVIDER_SITE_OTHER): Payer: Medicare Other | Admitting: Internal Medicine

## 2012-08-29 ENCOUNTER — Ambulatory Visit (INDEPENDENT_AMBULATORY_CARE_PROVIDER_SITE_OTHER)
Admission: RE | Admit: 2012-08-29 | Discharge: 2012-08-29 | Disposition: A | Discharge status: Home / Self Care | Payer: Medicare Other | Source: Ambulatory Visit | Attending: Internal Medicine | Admitting: Internal Medicine

## 2012-08-29 ENCOUNTER — Encounter: Payer: Self-pay | Admitting: Internal Medicine

## 2012-08-29 VITALS — BP 120/64 | HR 74 | Ht 68.0 in | Wt 170.4 lb

## 2012-08-29 DIAGNOSIS — J449 Chronic obstructive pulmonary disease, unspecified: Secondary | ICD-10-CM

## 2012-08-29 DIAGNOSIS — J438 Other emphysema: Secondary | ICD-10-CM

## 2012-08-29 NOTE — Progress Notes (Signed)
08/29/12- 44 yoM former smoker (60 pk yrs)referred courtesy of Dr Zachery Dauer because of emphysema. Wife is here. Smoked 60 pack years prior to 55. He has been aware of dyspnea on exertion a diagnosis of emphysema for about 4 years. There has been gradual progression. Little cough except with colds. He is okay with activities of daily living and his breathing does not wake. He exercises regularly for his hip and leg with an exercycle. Up to date on flu and pneumonia vaccines He denies history of asthma or pneumonia but has had some seasonal allergic rhinitis. No heart disease or anemia. Family history is negative for lung disease. 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 CXR 01/13/10 IMPRESSION:  COPD/chronic changes.  Provider: Donnita Falls  Prior to Admission medications   Medication Sig Start Date End Date Taking? Authorizing Provider  albuterol (PROAIR HFA) 108 (90 BASE) MCG/ACT inhaler Inhale 1-2 puffs into the lungs every 4 (four) hours as needed. as needed shortness of breath prior to exercise 03/27/11  Yes Tobin Chad, MD  amLODipine (NORVASC) 10 MG tablet TAKE ONE TABLET BY MOUTH ONE TIME DAILY 01/20/12  Yes Tobin Chad, MD  aspirin 325 MG tablet Take 325 mg by mouth daily.     Yes Historical Provider, MD  atorvastatin (LIPITOR) 20 MG tablet TAKE ONE TABLET BY MOUTH ONE TIME DAILY 01/23/12  Yes Tobin Chad, MD  CALCIPOTRIENE EX Apply topically.   Yes Historical Provider, MD  cholecalciferol (VITAMIN D) 1000 UNITS tablet Take 1,000 Units by mouth daily.   Yes Historical Provider, MD  cyanocobalamin 1000 MCG tablet Take 100 mcg by mouth daily.   Yes Historical Provider, MD  ELOCON 0.1 % lotion  02/13/11  Yes Historical Provider, MD  finasteride (PROSCAR) 5 MG tablet TAKE ONE TABLET BY MOUTH ONE TIME DAILY 01/20/12  Yes Tobin Chad, MD  fluticasone [facility]) 50 MCG/ACT nasal spray Place 1 spray into the nose daily.   Yes  Historical Provider, MD  Fluticasone-Salmeterol (ADVAIR DISKUS) 500-50 MCG/DOSE AEPB Inhale 1 puff into the lungs every 12 (twelve) hours. 07/15/12  Yes Tobin Chad, MD  hydrochlorothiazide (HYDRODIURIL) 25 MG tablet TAKE ONE TABLET BY MOUTH IN THE MORNING 08/23/12  Yes Tobin Chad, MD  Tamsulosin HCl (FLOMAX) 0.4 MG CAPS TAKE TWO CAPSULES BY MOUTH DAILY 01/23/12  Yes Tobin Chad, MD  therapeutic multivitamin-minerals [facility]) tablet Take 1 tablet by mouth daily.     Yes Historical Provider, MD   Past Medical History  Diagnosis Date  . Villous adenoma of colon 11/.2001    2 cm removed cecum  . Adenomatous polyp of colon 08/2001    removed hepatic flexure  . Hypertension   . Hyperlipidemia   . Raynaud disease    Past Surgical History  Procedure Date  . Prostate biopsy 4/95  . Total hip arthroplasty 1952    right  . Hernia repair 7/87  . 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 Joint replacement     Right Hip   Family History  Problem Relation Age of Onset  . Heart failure Mother   . Heart disease Mother   . Aneurysm Father    History   Social History  . Marital Status: Married    Spouse Name: Darl Pikes    Number of Children: 1  . Years of Education: 75   Occupational History  . 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  Social History Main Topics  . Smoking status: Former Smoker -- 1.5 packs/day for 40 years    Types: Cigarettes    Quit date: 07/30/1986  . Smokeless tobacco: Not on file  . Alcohol Use: 12.0 - 21.0 oz/week    20-35 Glasses of wine per week     Comment: 3-5 glasses of wine daily, lost license DUI  . Drug Use: Not on file  . Sexually Active: Not on file   Other Topics Concern  . 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 Sun Exposure/Protection: Pt reports not using sun protection.Hobbies: reading, writing, music   ROS-see HPI Constitutional:   No-   weight loss, night sweats, fevers, chills, fatigue, lassitude. HEENT:   No-  headaches, difficulty swallowing, tooth/dental problems, sore throat,       No-  sneezing, itching, ear ache, nasal congestion, post nasal drip,  CV:  No-   chest pain, orthopnea, PND, swelling in lower extremities, anasarca,                                  dizziness, palpitations Resp: +  shortness of breath with exertion or at rest.              No-   productive cough,  No non-productive cough,  No- coughing up of blood.              No-   change in color of mucus.  No- wheezing.   Skin: No-   rash or lesions. GI:  No-   heartburn, indigestion, abdominal pain, nausea, vomiting, diarrhea,                 change in bowel habits, loss of appetite GU: No-   dysuria, change in color of urine, no urgency or frequency.  No- flank pain. MS:  No-   joint pain or swelling.  No- decreased range of motion.  No- back pain. Neuro-     nothing unusual Psych:  No- change in mood or affect. No depression or anxiety.  No memory loss.  OBJ- Physical Exam General- Alert, Oriented, Affect-appropriate, Distress- none acute, trim Skin- rash-none, lesions- none, excoriation- none, pale Lymphadenopathy- none Head- atraumatic            Eyes- Gross vision intact, PERRLA, conjunctivae and secretions clear            Ears- Hearing, canals-normal            Nose- Clear, no-Septal dev, mucus, polyps, erosion, perforation             Throat- Mallampati III , mucosa clear , drainage- none, tonsils- atrophic Neck- flexible , trachea midline, no stridor , thyroid nl, carotid no bruit Chest - symmetrical excursion , unlabored           Heart/CV- RRR , no  murmur , no gallop  , no rub, nl s1 s2                           - JVD-  none , edema- none, stasis changes- none, varices- none           Lung- +distant butclear to P&A, wheeze- none, cough- none , dullness-none, rub- none           Chest wall-  Abd- tender-no, distended-no, bowel sounds-present, HSM- no Br/ Gen/ Rectal- Not done, not indicated Extrem- cyanosis- none, clubbing, none, atrophy- none, strength- nl Neuro- grossly intact to observation

## 2012-08-29 NOTE — Patient Instructions (Addendum)
Order- lab CBC, alpha1antitrypsin,                        CXR- dx COPD                    Schedule PFT                     Schedule 6 MWT

## 2012-09-10 NOTE — Progress Notes (Signed)
Quick Note:  LMTCB ______ 

## 2012-09-11 ENCOUNTER — Telehealth: Payer: Self-pay | Admitting: Internal Medicine

## 2012-09-11 NOTE — Telephone Encounter (Signed)
CXR- stable changes of COPD, nothing new   Pt aware of results.

## 2012-09-11 NOTE — Progress Notes (Signed)
Quick Note:  Pt aware of results. ______ 

## 2012-09-12 NOTE — Assessment & Plan Note (Signed)
Emphysema predominant COPD secondary to long history of tobacco abuse. We need to define current status. He will be at increased risk for lung cancer and coronary artery disease. His enlarged prostate may limit medication choices. He is encouraged to remain physically active to maintain endurance.

## 2012-10-04 ENCOUNTER — Ambulatory Visit (INDEPENDENT_AMBULATORY_CARE_PROVIDER_SITE_OTHER): Payer: Medicare Other | Admitting: Internal Medicine

## 2012-10-04 ENCOUNTER — Encounter: Payer: Self-pay | Admitting: Internal Medicine

## 2012-10-04 VITALS — BP 120/68 | HR 100 | Ht 68.0 in | Wt 168.0 lb

## 2012-10-04 DIAGNOSIS — J439 Emphysema, unspecified: Secondary | ICD-10-CM

## 2012-10-04 DIAGNOSIS — R0600 Dyspnea, unspecified: Secondary | ICD-10-CM

## 2012-10-04 DIAGNOSIS — J438 Other emphysema: Secondary | ICD-10-CM

## 2012-10-04 DIAGNOSIS — J449 Chronic obstructive pulmonary disease, unspecified: Secondary | ICD-10-CM

## 2012-10-04 DIAGNOSIS — R0989 Other specified symptoms and signs involving the circulatory and respiratory systems: Secondary | ICD-10-CM

## 2012-10-04 DIAGNOSIS — R0609 Other forms of dyspnea: Secondary | ICD-10-CM

## 2012-10-04 LAB — PULMONARY FUNCTION TEST

## 2012-10-04 NOTE — Patient Instructions (Addendum)
Order- ONOX on room air    Dx COPD with emphysema

## 2012-10-04 NOTE — Progress Notes (Signed)
08/29/12- 79 yoM former smoker (60 pk yrs)referred courtesy of Dr Zachery Dauer because of emphysema. Wife is here. Smoked 60 pack years prior to 71. He has been aware of dyspnea on exertion a diagnosis of emphysema for about 4 years. There has been gradual progression. Little cough except with colds. He is okay with activities of daily living and his breathing does not wake. He exercises regularly for his hip and leg with an exercycle. Up to date on flu and pneumonia vaccines He denies history of asthma or pneumonia but has had some seasonal allergic rhinitis. No heart disease or anemia. Family history is negative for lung disease. 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 CXR 01/13/10 IMPRESSION:  COPD/chronic changes.  Provider: Donnita Falls  10/04/12- 79 yoM former smoker (60 pk yrs)referred courtesy of Dr Zachery Dauer because of emphysema. Wife is here. Smoked 60 pack years prior to 59. FOLLOWS FOR: review PFT and results with patient.  He reports no change since last here. He tries to walk on a treadmill and I emphasized the value of maintaining aerobic activity. He has enlarged prostate so we are avoiding Spiriva. CXR 09/11/12-IMPRESSION:  Stable COPD. No active disease.  Original Report Authenticated By: Natasha Mead, M.D. PFT- 10/04/2012-severe obstructive airways disease with insignificant response to bronchodilator, emphysema pattern. Air-trapping with increased residual volume. Diffusion moderately reduced. FVC 3.03/78%, FEV11.11/44%, FEV1/FVC 0.37. TLC 115%, RV 137%, DLCO 52%. - 10/04/2012-92%, 83%, 91%, 336 m. Significant desaturation with exertion.  ROS-see HPI Constitutional:   No-   weight loss, night sweats, fevers, chills, fatigue, lassitude. HEENT:   No-  headaches, difficulty swallowing, tooth/dental problems, sore throat,       No-  sneezing, itching, ear ache, nasal congestion, post nasal drip,  CV:  No-   chest pain,  orthopnea, PND, swelling in lower extremities, anasarca,                                  dizziness, palpitations Resp: +  shortness of breath with exertion or at rest.              No-   productive cough,  No non-productive cough,  No- coughing up of blood.              No-   change in color of mucus.  No- wheezing.   Skin: No-   rash or lesions. GI:  No-   heartburn, indigestion, abdominal pain, nausea, vomiting, GU: MS:  No-   joint pain or swelling. . Neuro-     nothing unusual Psych:  No- change in mood or affect. No depression or anxiety.  No memory loss.  OBJ- Physical Exam General- Alert, Oriented, Affect-appropriate, Distress- none acute, trim Skin- rash-none, lesions- none, excoriation- none, pale Lymphadenopathy- none Head- atraumatic            Eyes- Gross vision intact, PERRLA, conjunctivae and secretions clear            Ears- Hearing, canals-normal            Nose- Clear, no-Septal dev, mucus, polyps, erosion, perforation             Throat- Mallampati III , mucosa clear , drainage- none, tonsils- atrophic Neck- flexible , trachea midline, no stridor , thyroid nl, carotid no bruit Chest - symmetrical excursion , unlabored  Heart/CV- RRR , no murmur , no gallop  , no rub, nl s1 s2                           - JVD- none , edema- none, stasis changes- none, varices- none           Lung- +distant but clear to P&A, wheeze- none, cough- none , dullness-none, rub- none           Chest wall-  Abd-  Br/ Gen/ Rectal- Not done, not indicated Extrem- cyanosis- none, clubbing, none, atrophy- none, strength- nl Neuro- grossly intact to observation

## 2012-10-04 NOTE — Progress Notes (Signed)
PFT done today. 

## 2012-10-05 NOTE — Assessment & Plan Note (Signed)
We will get overnight oximetry to better understand his oxygen status. For now he will pace himself but is encouraged to maintain regular exercise for stamina. Discussed [facility]. Unfortunately his prostate symptoms sound difficult enough that he probably will not tolerate Spiriva so we don't have a lot to offer as medication.

## 2012-10-06 NOTE — Progress Notes (Signed)
Documentation for 6 minute walk test 

## 2012-10-08 ENCOUNTER — Other Ambulatory Visit: Payer: Self-pay | Admitting: Family Medicine

## 2012-10-08 ENCOUNTER — Telehealth: Payer: Self-pay | Admitting: Internal Medicine

## 2012-10-08 NOTE — Telephone Encounter (Signed)
Spoke with pts wife,had a nurse come out and mention symbicort might help pt with  His copd. Wants to know if maybe symbicort would help with seasonal allergies to? Pt has a constant runny nose,an sneezing. Wants to know if he could benefit with using symbicort And can  he use symbicort with having an enlarged prostrate? States breathing is about  The same.Pt is on adviar. Allergies  Allergen Reactions  . Other     ANTIHISTAMINES---Enlarged Prostate   Dr Maple Hudson Please advise Thank you

## 2012-10-08 NOTE — Telephone Encounter (Signed)
Per CY-1)Symbicort is similar to Advair; can try if he wants, but it is same idea 2) suggest try sample of Dymista nasal spray 1-2 sprays in each nostril at bedtime for allergies.

## 2012-10-08 NOTE — Telephone Encounter (Signed)
.  spoke with pt wants to stay on the advair for now and will hold off on the dymista for now.  Nothing further needed.

## 2012-10-16 ENCOUNTER — Telehealth: Payer: Self-pay | Admitting: Internal Medicine

## 2012-10-16 DIAGNOSIS — J439 Emphysema, unspecified: Secondary | ICD-10-CM

## 2012-10-16 NOTE — Telephone Encounter (Signed)
Spoke with Darl Pikes and notified that CDY is not here in the office until tomorrow, but I will forward him the msg to let him know pt is wanting results asap Please advise on ONO results, thanks!

## 2012-10-17 ENCOUNTER — Encounter: Payer: Self-pay | Admitting: Internal Medicine

## 2012-10-17 NOTE — Telephone Encounter (Signed)
Overnight oximetry shows that his oxygen level drops at night into the range where oxygen during sleep is recommended.  Please order [facility] Home O2 for sleep, 2 L/Min[facility]    Dx COPD

## 2012-10-17 NOTE — Telephone Encounter (Signed)
Called, spoke with pt's wife.  Informed her of below ONO results and recs per Dr. Maple Hudson.  She verbalized understanding of this and is aware [facility] will be calling her back to set up the o2.  She voiced no further questions or concerns at this time.

## 2012-11-04 ENCOUNTER — Other Ambulatory Visit: Payer: Self-pay | Admitting: Family Medicine

## 2012-11-04 DIAGNOSIS — E785 Hyperlipidemia, unspecified: Secondary | ICD-10-CM

## 2012-11-04 NOTE — Telephone Encounter (Signed)
He needs fasting lipid profile and CMET

## 2012-11-08 ENCOUNTER — Other Ambulatory Visit: Payer: Medicare Other

## 2012-11-08 DIAGNOSIS — E785 Hyperlipidemia, unspecified: Secondary | ICD-10-CM

## 2012-11-08 LAB — LIPID PANEL
Cholesterol: 146 mg/dL (ref 0–200)
HDL: 58 mg/dL (ref >39)
LDL Cholesterol: 60 mg/dL (ref 0–99)
Total CHOL/HDL Ratio: 2.5 Ratio
Triglycerides: 141 mg/dL (ref <150)
VLDL: 28 mg/dL (ref 0–40)

## 2012-11-08 LAB — COMPREHENSIVE METABOLIC PANEL
ALT: 16 U/L (ref 0–53)
AST: 19 U/L (ref 0–37)
Albumin: 3.9 g/dL (ref 3.5–5.2)
Alkaline Phosphatase: 67 U/L (ref 39–117)
BUN: 11 mg/dL (ref 6–23)
CO2: 26 mEq/L (ref 19–32)
Calcium: 9.3 mg/dL (ref 8.4–10.5)
Chloride: 105 mEq/L (ref 96–112)
Creat: 0.85 mg/dL (ref 0.50–1.35)
Glucose, Bld: 85 mg/dL (ref 70–99)
Potassium: 3.6 mEq/L (ref 3.5–5.3)
Sodium: 141 mEq/L (ref 135–145)
Total Bilirubin: 0.6 mg/dL (ref 0.3–1.2)
Total Protein: 6.2 g/dL (ref 6.0–8.3)

## 2012-11-08 NOTE — Progress Notes (Signed)
CMP AND FLP DONE TODAY Alin Chavira 

## 2012-11-10 ENCOUNTER — Encounter: Payer: Self-pay | Admitting: Family Medicine

## 2012-11-18 ENCOUNTER — Encounter (INDEPENDENT_AMBULATORY_CARE_PROVIDER_SITE_OTHER): Payer: Medicare Other | Admitting: *Deleted

## 2012-11-18 DIAGNOSIS — I739 Peripheral vascular disease, unspecified: Secondary | ICD-10-CM

## 2012-11-18 DIAGNOSIS — Z48812 Encounter for surgical aftercare following surgery on the circulatory system: Secondary | ICD-10-CM

## 2012-11-19 ENCOUNTER — Ambulatory Visit: Payer: Medicare Other | Admitting: Neurosurgery

## 2012-11-19 ENCOUNTER — Other Ambulatory Visit: Payer: Self-pay

## 2012-11-19 DIAGNOSIS — I739 Peripheral vascular disease, unspecified: Secondary | ICD-10-CM

## 2012-11-19 DIAGNOSIS — Z48812 Encounter for surgical aftercare following surgery on the circulatory system: Secondary | ICD-10-CM

## 2012-11-22 ENCOUNTER — Encounter: Payer: Self-pay | Admitting: Vascular Surgery

## 2012-12-20 ENCOUNTER — Encounter: Payer: Self-pay | Admitting: Internal Medicine

## 2012-12-20 ENCOUNTER — Telehealth: Payer: Self-pay | Admitting: Internal Medicine

## 2012-12-20 ENCOUNTER — Ambulatory Visit (INDEPENDENT_AMBULATORY_CARE_PROVIDER_SITE_OTHER): Payer: Medicare Other | Admitting: Internal Medicine

## 2012-12-20 VITALS — BP 140/70 | HR 88 | Ht 68.0 in | Wt 164.0 lb

## 2012-12-20 DIAGNOSIS — J439 Emphysema, unspecified: Secondary | ICD-10-CM

## 2012-12-20 DIAGNOSIS — J438 Other emphysema: Secondary | ICD-10-CM

## 2012-12-20 MED ORDER — AMOXICILLIN-POT CLAVULANATE 875-125 MG PO TABS: 1.0000 | ORAL_TABLET | Freq: Two times a day (BID) | ORAL | Status: DC

## 2012-12-20 MED ORDER — BENZONATATE 200 MG PO CAPS: 200.0000 mg | ORAL_CAPSULE | Freq: Three times a day (TID) | ORAL | Status: DC | PRN

## 2012-12-20 MED ORDER — HYDROCODONE-HOMATROPINE 5-1.5 MG/5ML PO SYRP: 5.0000 mL | ORAL_SOLUTION | Freq: Four times a day (QID) | ORAL | Status: DC | PRN

## 2012-12-20 MED ORDER — PREDNISONE 10 MG PO TABS: ORAL_TABLET | ORAL | Status: DC

## 2012-12-20 NOTE — Telephone Encounter (Signed)
Spoke with Susan--patients spouse Per Darl Pikes patient has had an infection x 2weeks that she knows has been "complicated by his allergies and emphysema" Patient has sore throat, a now prod cough -Darl Pikes does not know color of mucus, tender skin and scalp, very fatigued, Stuffy nose in which he is unable to use o2 at night properly. Patient has been using mucinex and an "antispasmatic" (unsure of name) Darl Pikes is requesting patient be seen today. Dr. Maple Hudson please advise as there are no open slots on your schedule  Last OV 2.21.14 Next OV 5.19.14  Allergies  Allergen Reactions  . Other     ANTIHISTAMINES---Enlarged Prostate

## 2012-12-20 NOTE — Progress Notes (Signed)
08/29/12- 12 yoM former smoker (60 pk yrs)referred courtesy of Dr Zachery Dauer because of emphysema. Wife is here. Smoked 60 pack years prior to 41. He has been aware of dyspnea on exertion a diagnosis of emphysema for about 4 years. There has been gradual progression. Little cough except with colds. He is okay with activities of daily living and his breathing does not wake. He exercises regularly for his hip and leg with an exercycle. Up to date on flu and pneumonia vaccines He denies history of asthma or pneumonia but has had some seasonal allergic rhinitis. No heart disease or anemia. Family history is negative for lung disease. 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 CXR 01/13/10 IMPRESSION:  COPD/chronic changes.  Provider: Donnita Falls  10/04/12- 79 yoM former smoker (60 pk yrs)referred courtesy of Dr Zachery Dauer because of emphysema. Wife is here. Smoked 60 pack years prior to 38. FOLLOWS FOR: review PFT and results with patient.  He reports no change since last here. He tries to walk on a treadmill and I emphasized the value of maintaining aerobic activity. He has enlarged prostate so we are avoiding Spiriva. CXR 09/11/12-IMPRESSION:  Stable COPD. No active disease.  Original Report Authenticated By: Natasha Mead, M.D. PFT- 10/04/2012-severe obstructive airways disease with insignificant response to bronchodilator, emphysema pattern. Air-trapping with increased residual volume. Diffusion moderately reduced. FVC 3.03/78%, FEV11.11/44%, FEV1/FVC 0.37. TLC 115%, RV 137%, DLCO 52%. - 10/04/2012-92%, 83%, 91%, 336 m. Significant desaturation with exertion.  12/20/12-79 yoM former smoker (60 pk yrs)referred courtesy of Dr Zachery Dauer because of emphysema. Wife is here. Smoked 60 pack years prior to 44. Acute Visit-  Reports having a productive cough x2 weeks. Mucus is yellow. Has been more fatigued than normal and gets lightheaded from  time to time. Positive for chest tightness and SOB. Has used several OTC medications without relief. Started with a chest cold. Denies fever, pain, blood but feels light headed and short of breath  ROS-see HPI Constitutional:   No-   weight loss, night sweats, fevers, chills, fatigue, lassitude. HEENT:   No-  headaches, difficulty swallowing, tooth/dental problems, sore throat,       No-  sneezing, itching, ear ache, +nasal congestion, post nasal drip,  CV:  No-   chest pain, orthopnea, PND, swelling in lower extremities, anasarca,                                  dizziness, palpitations Resp: +  shortness of breath with exertion or at rest.              +productive cough,  No non-productive cough,  No- coughing up of blood.              +  change in color of mucus.  No- wheezing.   Skin: No-   rash or lesions. GI:  No-   heartburn, indigestion, abdominal pain, nausea, vomiting, GU: MS:  No-   joint pain or swelling. . Neuro-     nothing unusual Psych:  No- change in mood or affect. No depression or anxiety.  No memory loss.  OBJ- Physical Exam General- Alert, Oriented, Affect-appropriate, Distress- none acute, trim Skin- rash-none, lesions- none, excoriation- none, pale Lymphadenopathy- none Head- atraumatic            Eyes- Gross vision intact, PERRLA, conjunctivae and secretions clear  Ears- Hearing, canals-normal            Nose- Clear, no-Septal dev, mucus, polyps, erosion, perforation             Throat- Mallampati III , mucosa clear , drainage- none, tonsils- atrophic Neck- flexible , trachea midline, no stridor , thyroid nl, carotid no bruit Chest - symmetrical excursion , unlabored           Heart/CV- RRR , no murmur , no gallop  , no rub, nl s1 s2                           - JVD- none , edema- none, stasis changes- none, varices- none           Lung- +distant but clear to P&A, wheeze- none, cough+ dry , dullness-none, rub- none           Chest wall-  Abd-  Br/  Gen/ Rectal- Not done, not indicated Extrem- cyanosis- none, clubbing, none, atrophy- none, strength- nl Neuro- grossly intact to observation

## 2012-12-20 NOTE — Telephone Encounter (Signed)
Per CY - we can see the pt at 4:15pm.  Pt's wife is aware. They will come in at 4:00pm.

## 2012-12-20 NOTE — Telephone Encounter (Signed)
Pt's spouse called back again re: same. Wants pt to be seen asap today. Please call back asap. Hazel Sams

## 2012-12-20 NOTE — Patient Instructions (Addendum)
Script for hydromet cough syrup printed  Script sent for benzonatate perles for cough                         augmentin antibiotic                         Prednisone taper

## 2012-12-30 ENCOUNTER — Ambulatory Visit (INDEPENDENT_AMBULATORY_CARE_PROVIDER_SITE_OTHER): Payer: Medicare Other | Admitting: Internal Medicine

## 2012-12-30 ENCOUNTER — Encounter: Payer: Self-pay | Admitting: Internal Medicine

## 2012-12-30 VITALS — BP 122/68 | HR 83 | Ht 68.75 in | Wt 161.4 lb

## 2012-12-30 DIAGNOSIS — J439 Emphysema, unspecified: Secondary | ICD-10-CM

## 2012-12-30 DIAGNOSIS — J438 Other emphysema: Secondary | ICD-10-CM

## 2012-12-30 MED ORDER — PREDNISONE (PAK) 10 MG PO TABS: ORAL_TABLET | ORAL | Status: DC

## 2012-12-30 NOTE — Progress Notes (Signed)
08/29/12- 81 yoM former smoker (60 pk yrs)referred courtesy of Dr Zachery Dauer because of emphysema. Wife is here. Smoked 60 pack years prior to 36. He has been aware of dyspnea on exertion a diagnosis of emphysema for about 4 years. There has been gradual progression. Little cough except with colds. He is okay with activities of daily living and his breathing does not wake. He exercises regularly for his hip and leg with an exercycle. Up to date on flu and pneumonia vaccines He denies history of asthma or pneumonia but has had some seasonal allergic rhinitis. No heart disease or anemia. Family history is negative for lung disease. 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 CXR 01/13/10 IMPRESSION:  COPD/chronic changes.  Provider: Donnita Falls  10/04/12- 79 yoM former smoker (60 pk yrs)referred courtesy of Dr Zachery Dauer because of emphysema. Wife is here. Smoked 60 pack years prior to 47. FOLLOWS FOR: review PFT and results with patient.  He reports no change since last here. He tries to walk on a treadmill and I emphasized the value of maintaining aerobic activity. He has enlarged prostate so we are avoiding Spiriva. CXR 09/11/12-IMPRESSION:  Stable COPD. No active disease.  Original Report Authenticated By: Natasha Mead, M.D. PFT- 10/04/2012-severe obstructive airways disease with insignificant response to bronchodilator, emphysema pattern. Air-trapping with increased residual volume. Diffusion moderately reduced. FVC 3.03/78%, FEV11.11/44%, FEV1/FVC 0.37. TLC 115%, RV 137%, DLCO 52%. - 10/04/2012-92%, 83%, 91%, 336 m. Significant desaturation with exertion.  12/20/12-79 yoM former smoker (60 pk yrs)referred courtesy of Dr Zachery Dauer because of emphysema. Wife is here. Smoked 60 pack years prior to 25. FOLLOWS FOR: Reports having a productive cough x2 weeks. Mucus is yellow. Has been more fatigued than normal and gets lightheaded from  time to time. Positive for chest tightness and SOB. Has used several OTC medications without relief.  12/30/12-79 yoM former smoker (60 pk yrs)referred courtesy of Dr Zachery Dauer because of emphysema. Wife is here. Smoked 60 pack years prior to 78. FOLLOWS FOR: mostly dry cough-at times will be productive-ivory in color. Denies any SOB or wheezing.  Dry cough persists. Overnight oximetry to 10/09/12 documented need for oxygen during sleep. We discussed oxygen therapy. He asks about maintenance steroid therapy and we discussed this carefully. O2 2L sleep/ [facility].  ROS-see HPI Constitutional:   No-   weight loss, night sweats, fevers, chills, fatigue, lassitude. HEENT:   No-  headaches, difficulty swallowing, tooth/dental problems, sore throat,       No-  sneezing, itching, ear ache, nasal congestion, post nasal drip,  CV:  No-   chest pain, orthopnea, PND, swelling in lower extremities, anasarca,                                  dizziness, palpitations Resp: +  shortness of breath with exertion or at rest.              No-   productive cough,  No non-productive cough,  No- coughing up of blood.              No-   change in color of mucus.  No- wheezing.   Skin: No-   rash or lesions. GI:  No-   heartburn, indigestion, abdominal pain, nausea, vomiting, GU: MS:  No-   joint pain or swelling. . Neuro-     nothing unusual  Psych:  No- change in mood or affect. No depression or anxiety.  No memory loss.  OBJ- Physical Exam General- Alert, Oriented, Affect-appropriate, Distress- none acute, trim Skin- rash-none, lesions- none, excoriation- none, pale Lymphadenopathy- none Head- atraumatic            Eyes- Gross vision intact, PERRLA, conjunctivae and secretions clear            Ears- Hearing, canals-normal            Nose- Clear, no-Septal dev, mucus, polyps, erosion, perforation             Throat- Mallampati III , mucosa clear , drainage- none, tonsils- atrophic Neck- flexible , trachea midline,  no stridor , thyroid nl, carotid no bruit Chest - symmetrical excursion , unlabored           Heart/CV- RRR , no murmur , no gallop  , no rub, nl s1 s2                           - JVD- none , edema- none, stasis changes- none, varices- none           Lung- +distant but clear to P&A, wheeze- none, cough-+light , dullness-none, rub- none           Chest wall-  Abd-  Br/ Gen/ Rectal- Not done, not indicated Extrem- cyanosis- none, clubbing, none, atrophy- none, strength- nl Neuro- grossly intact to observation

## 2012-12-30 NOTE — Patient Instructions (Addendum)
Script sent for prednisone  Ok to use occasional Afrin  Ok to use tessalon perles and Mucinex-DM for cough as needed

## 2013-01-01 NOTE — Assessment & Plan Note (Signed)
Acute exacerbation probably began as a viral infection. Plan-prednisone taper, Augmentin, cough syrup, fluids, supportive care is discussed

## 2013-01-10 ENCOUNTER — Telehealth: Payer: Self-pay | Admitting: Internal Medicine

## 2013-01-10 NOTE — Telephone Encounter (Signed)
Spoke with spouse She is asking for prophylactic abx for the pt to take so that he does not get a stomach bug He son is in town and has a stomach bug  I advised we can not prescribe an abx for this He needs to wash his hands and keep his distance Call PCP if he does develop symptoms

## 2013-01-11 NOTE — Assessment & Plan Note (Signed)
Hit his wife are getting used to the idea of severe COPD. Plan-oxygen 2 L for sleep. Prednisone 10 mg daily x2 weeks then 10 mg every other day for 2 weeks then stop

## 2013-01-13 ENCOUNTER — Other Ambulatory Visit: Payer: Self-pay | Admitting: Family Medicine

## 2013-03-19 ENCOUNTER — Other Ambulatory Visit: Payer: Self-pay | Admitting: Family Medicine

## 2013-03-24 ENCOUNTER — Ambulatory Visit (INDEPENDENT_AMBULATORY_CARE_PROVIDER_SITE_OTHER): Payer: Medicare Other | Admitting: Internal Medicine

## 2013-03-24 ENCOUNTER — Encounter: Payer: Self-pay | Admitting: Internal Medicine

## 2013-03-24 VITALS — BP 122/64 | HR 65 | Ht 68.0 in | Wt 164.0 lb

## 2013-03-24 DIAGNOSIS — J438 Other emphysema: Secondary | ICD-10-CM

## 2013-03-24 DIAGNOSIS — J439 Emphysema, unspecified: Secondary | ICD-10-CM

## 2013-03-24 NOTE — Assessment & Plan Note (Signed)
Minor tracheobronchitis- weather? Not infection. Discussed tier cost Advair. Will try [facility], which would also permit use of spacer to help throat/ voice. Plan- sample Dulera trial. Consider spacer if we stay with it.

## 2013-03-24 NOTE — Progress Notes (Signed)
08/29/12- 23 yoM former smoker (60 pk yrs)referred courtesy of Dr Zachery Dauer because of emphysema. Wife is here. Smoked 60 pack years prior to 55. He has been aware of dyspnea on exertion a diagnosis of emphysema for about 4 years. There has been gradual progression. Little cough except with colds. He is okay with activities of daily living and his breathing does not wake. He exercises regularly for his hip and leg with an exercycle. Up to date on flu and pneumonia vaccines He denies history of asthma or pneumonia but has had some seasonal allergic rhinitis. No heart disease or anemia. Family history is negative for lung disease. 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 CXR 01/13/10 IMPRESSION:  COPD/chronic changes.  Provider: Donnita Falls  10/04/12- 79 yoM former smoker (60 pk yrs)referred courtesy of Dr Zachery Dauer because of emphysema. Wife is here. Smoked 60 pack years prior to 28. FOLLOWS FOR: review PFT and results with patient.  He reports no change since last here. He tries to walk on a treadmill and I emphasized the value of maintaining aerobic activity. He has enlarged prostate so we are avoiding Spiriva. CXR 09/11/12-IMPRESSION:  Stable COPD. No active disease.  Original Report Authenticated By: Natasha Mead, M.D. PFT- 10/04/2012-severe obstructive airways disease with insignificant response to bronchodilator, emphysema pattern. Air-trapping with increased residual volume. Diffusion moderately reduced. FVC 3.03/78%, FEV11.11/44%, FEV1/FVC 0.37. TLC 115%, RV 137%, DLCO 52%. - 10/04/2012-92%, 83%, 91%, 336 m. Significant desaturation with exertion.  12/20/12-79 yoM former smoker (60 pk yrs)referred courtesy of Dr Zachery Dauer because of emphysema. Wife is here. Smoked 60 pack years prior to 3. FOLLOWS FOR: Reports having a productive cough x2 weeks. Mucus is yellow. Has been more fatigued than normal and gets lightheaded from  time to time. Positive for chest tightness and SOB. Has used several OTC medications without relief.  12/30/12-79 yoM former smoker (60 pk yrs)referred courtesy of Dr Zachery Dauer because of emphysema. Wife is here. Smoked 60 pack years prior to 65. FOLLOWS FOR: mostly dry cough-at times will be productive-ivory in color. Denies any SOB or wheezing.  Dry cough persists. Overnight oximetry to 10/09/12 documented need for oxygen during sleep. We discussed oxygen therapy. He asks about maintenance steroid therapy and we discussed this carefully. O2 2L sleep/ APS.  03/24/13- 22 yoM former smoker (60 pk yrs)r followed for COPD/ emphysema. Wife is here. Smoked 60 pack years prior to 23. FOLLOWS EAV:[id number] any SOB, wheezing or cough. Wife states patient has quite a bit of congestion (more in throat area) O2 2L sleep/ APS Note "gravelly " voice and throat congestion- speaking engagements pending. Scant white sputum, no infection. Felt better while on prednisone. Steroid talk done. Advair now 3rd tier.   ROS-see HPI Constitutional:   No-   weight loss, night sweats, fevers, chills, fatigue, lassitude. HEENT:   No-  headaches, difficulty swallowing, tooth/dental problems, sore throat,       No-  sneezing, itching, ear ache, nasal congestion, post nasal drip,  CV:  No-   chest pain, orthopnea, PND, swelling in lower extremities, anasarca,  dizziness, palpitations Resp: +  shortness of breath with exertion or at rest.              + productive cough,  No non-productive cough,  No- coughing up of blood.              No-   change in color of mucus.  No- wheezing.   Skin: No-   rash or lesions. GI:  No-   heartburn, indigestion, abdominal pain, nausea, vomiting, GU: MS:  No-   joint pain or swelling. . Neuro-     nothing unusual Psych:  No- change in mood or affect. No depression or anxiety.  No memory loss.  OBJ- Physical Exam General- Alert, Oriented, Affect-appropriate, Distress- none acute,  trim Skin- rash-none, lesions- none, excoriation- none, pale Lymphadenopathy- none Head- atraumatic            Eyes- Gross vision intact, PERRLA, conjunctivae and secretions clear            Ears- Hearing, canals-normal            Nose- Clear, no-Septal dev, mucus, polyps, erosion, perforation             Throat- Mallampati III , mucosa clear , drainage- none, tonsils- atrophic. No thrush Neck- flexible , trachea midline, no stridor , thyroid nl, carotid no bruit Chest - symmetrical excursion , unlabored           Heart/CV- RRR , no murmur , no gallop  , no rub, nl s1 s2                           - JVD- none , edema- none, stasis changes- none, varices- none           Lung- +distant but clear to P&A, wheeze- none, cough-none , dullness-none, rub- none. +Loose                             upper airway rattle easily cleared.           Chest wall-  Abd-  Br/ Gen/ Rectal- Not done, not indicated Extrem- cyanosis- none, clubbing, none, atrophy- none, strength- nl Neuro- grossly intact to observation

## 2013-03-24 NOTE — Patient Instructions (Addendum)
Sample Dulera 200  2 puffs, then rinse mouth, twice daily    Try this as a potentially cheaper alternative to Advair. When this sample is used up, go back to your Advair. You can ask your pharmacist which would be cheaper for you.  Try gargle once daily and try mucinex, to see if they help your gravelly voice.

## 2013-04-08 ENCOUNTER — Other Ambulatory Visit: Payer: Self-pay | Admitting: Family Medicine

## 2013-04-10 ENCOUNTER — Other Ambulatory Visit: Payer: 59 | Admitting: Internal Medicine

## 2013-04-10 DIAGNOSIS — Z130 Encounter for screening for diseases of the blood and blood-forming organs and certain disorders involving the immune mechanism: Secondary | ICD-10-CM

## 2013-04-10 DIAGNOSIS — Z125 Encounter for screening for malignant neoplasm of prostate: Secondary | ICD-10-CM

## 2013-04-10 DIAGNOSIS — I10 Essential (primary) hypertension: Secondary | ICD-10-CM

## 2013-04-10 DIAGNOSIS — N400 Enlarged prostate without lower urinary tract symptoms: Secondary | ICD-10-CM

## 2013-04-10 DIAGNOSIS — E785 Hyperlipidemia, unspecified: Secondary | ICD-10-CM

## 2013-04-10 LAB — CBC WITH DIFFERENTIAL/PLATELET
Basophils Absolute: 0.1 10*3/uL (ref 0.0–0.1)
Basophils Relative: 1 % (ref 0–1)
Eosinophils Absolute: 0.3 10*3/uL (ref 0.0–0.7)
Eosinophils Relative: 3 % (ref 0–5)
HCT: 44.3 % (ref 39.0–52.0)
Hemoglobin: 15.7 g/dL (ref 13.0–17.0)
Lymphocytes Relative: 28 % (ref 12–46)
Lymphs Abs: 2.4 10*3/uL (ref 0.7–4.0)
MCH: 32.3 pg (ref 26.0–34.0)
MCHC: 35.4 g/dL (ref 30.0–36.0)
MCV: 91.2 fL (ref 78.0–100.0)
Monocytes Absolute: 0.9 10*3/uL (ref 0.1–1.0)
Monocytes Relative: 11 % (ref 3–12)
Neutro Abs: 4.9 10*3/uL (ref 1.7–7.7)
Neutrophils Relative %: 57 % (ref 43–77)
Platelets: 250 10*3/uL (ref 150–400)
RBC: 4.86 MIL/uL (ref 4.22–5.81)
RDW: 14.1 % (ref 11.5–15.5)
WBC: 8.5 10*3/uL (ref 4.0–10.5)

## 2013-04-10 LAB — COMPREHENSIVE METABOLIC PANEL
ALT: 16 U/L (ref 0–53)
AST: 15 U/L (ref 0–37)
Albumin: 4.4 g/dL (ref 3.5–5.2)
Alkaline Phosphatase: 72 U/L (ref 39–117)
BUN: 12 mg/dL (ref 6–23)
CO2: 30 mEq/L (ref 19–32)
Calcium: 9.9 mg/dL (ref 8.4–10.5)
Chloride: 102 mEq/L (ref 96–112)
Creat: 0.83 mg/dL (ref 0.50–1.35)
Glucose, Bld: 97 mg/dL (ref 70–99)
Potassium: 3.6 mEq/L (ref 3.5–5.3)
Sodium: 142 mEq/L (ref 135–145)
Total Bilirubin: 0.7 mg/dL (ref 0.3–1.2)
Total Protein: 6.4 g/dL (ref 6.0–8.3)

## 2013-04-10 LAB — LIPID PANEL
Cholesterol: 144 mg/dL (ref 0–200)
HDL: 67 mg/dL (ref >39)
LDL Cholesterol: 60 mg/dL (ref 0–99)
Total CHOL/HDL Ratio: 2.1 Ratio
Triglycerides: 85 mg/dL (ref <150)
VLDL: 17 mg/dL (ref 0–40)

## 2013-04-11 ENCOUNTER — Ambulatory Visit (INDEPENDENT_AMBULATORY_CARE_PROVIDER_SITE_OTHER): Payer: 59 | Admitting: Internal Medicine

## 2013-04-11 ENCOUNTER — Encounter: Payer: Self-pay | Admitting: Internal Medicine

## 2013-04-11 VITALS — BP 136/74 | HR 80 | Temp 98.7°F | Ht 68.0 in | Wt 163.5 lb

## 2013-04-11 DIAGNOSIS — R010 Benign and innocent cardiac murmurs: Secondary | ICD-10-CM

## 2013-04-11 DIAGNOSIS — Z23 Encounter for immunization: Secondary | ICD-10-CM

## 2013-04-11 DIAGNOSIS — L408 Other psoriasis: Secondary | ICD-10-CM

## 2013-04-11 DIAGNOSIS — Z8679 Personal history of other diseases of the circulatory system: Secondary | ICD-10-CM

## 2013-04-11 DIAGNOSIS — H9192 Unspecified hearing loss, left ear: Secondary | ICD-10-CM

## 2013-04-11 DIAGNOSIS — Z0000 Encounter for general adult medical examination without abnormal findings: Secondary | ICD-10-CM

## 2013-04-11 DIAGNOSIS — R490 Dysphonia: Secondary | ICD-10-CM

## 2013-04-11 DIAGNOSIS — N400 Enlarged prostate without lower urinary tract symptoms: Secondary | ICD-10-CM

## 2013-04-11 DIAGNOSIS — Z8781 Personal history of (healed) traumatic fracture: Secondary | ICD-10-CM

## 2013-04-11 DIAGNOSIS — H9190 Unspecified hearing loss, unspecified ear: Secondary | ICD-10-CM

## 2013-04-11 DIAGNOSIS — L409 Psoriasis, unspecified: Secondary | ICD-10-CM

## 2013-04-11 DIAGNOSIS — Z8709 Personal history of other diseases of the respiratory system: Secondary | ICD-10-CM

## 2013-04-11 DIAGNOSIS — E785 Hyperlipidemia, unspecified: Secondary | ICD-10-CM

## 2013-04-11 DIAGNOSIS — I10 Essential (primary) hypertension: Secondary | ICD-10-CM

## 2013-04-11 LAB — POCT URINALYSIS DIPSTICK
Bilirubin, UA: NEGATIVE
Blood, UA: NEGATIVE
Glucose, UA: NEGATIVE
Ketones, UA: NEGATIVE
Leukocytes, UA: NEGATIVE
Nitrite, UA: NEGATIVE
Spec Grav, UA: 1.015
Urobilinogen, UA: NEGATIVE
pH, UA: 6.5

## 2013-04-11 LAB — PSA, MEDICARE: PSA: 1.99 ng/mL (ref <=4.00)
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
## 2013-04-12 ENCOUNTER — Encounter: Payer: Self-pay | Admitting: Internal Medicine

## 2013-04-12 DIAGNOSIS — R490 Dysphonia: Secondary | ICD-10-CM | POA: Insufficient documentation

## 2013-04-12 NOTE — Patient Instructions (Addendum)
Continue same meds and return in 6 months. Appt will be made with ENT regarding hoarseness. Influenza vaccine given today.

## 2013-04-17 ENCOUNTER — Other Ambulatory Visit: Payer: Self-pay | Admitting: Family Medicine

## 2013-06-11 ENCOUNTER — Other Ambulatory Visit: Payer: Self-pay | Admitting: *Deleted

## 2013-06-11 ENCOUNTER — Other Ambulatory Visit: Payer: Self-pay | Admitting: Internal Medicine

## 2013-06-11 MED ORDER — HYDROCODONE-ACETAMINOPHEN 5-325 MG PO TABS: 1.0000 | ORAL_TABLET | Freq: Two times a day (BID) | ORAL | Status: DC | PRN

## 2013-06-23 ENCOUNTER — Encounter: Payer: Self-pay | Admitting: Internal Medicine

## 2013-06-23 ENCOUNTER — Ambulatory Visit (INDEPENDENT_AMBULATORY_CARE_PROVIDER_SITE_OTHER): Payer: Medicare Other | Admitting: Internal Medicine

## 2013-06-23 VITALS — BP 122/72 | HR 73 | Ht 68.0 in | Wt 163.8 lb

## 2013-06-23 DIAGNOSIS — J438 Other emphysema: Secondary | ICD-10-CM

## 2013-06-23 DIAGNOSIS — J439 Emphysema, unspecified: Secondary | ICD-10-CM

## 2013-06-23 NOTE — Progress Notes (Signed)
08/29/12- 14 yoM former smoker (60 pk yrs)referred courtesy of Dr Zachery Dauer because of emphysema. Wife is here. Smoked 60 pack years prior to 40. He has been aware of dyspnea on exertion a diagnosis of emphysema for about 4 years. There has been gradual progression. Little cough except with colds. He is okay with activities of daily living and his breathing does not wake. He exercises regularly for his hip and leg with an exercycle. Up to date on flu and pneumonia vaccines He denies history of asthma or pneumonia but has had some seasonal allergic rhinitis. No heart disease or anemia. Family history is negative for lung disease. 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 CXR 01/13/10 IMPRESSION:  COPD/chronic changes.  Provider: Donnita Falls  10/04/12- 79 yoM former smoker (60 pk yrs)referred courtesy of Dr Zachery Dauer because of emphysema. Wife is here. Smoked 60 pack years prior to 16. FOLLOWS FOR: review PFT and results with patient.  He reports no change since last here. He tries to walk on a treadmill and I emphasized the value of maintaining aerobic activity. He has enlarged prostate so we are avoiding Spiriva. CXR 09/11/12-IMPRESSION:  Stable COPD. No active disease.  Original Report Authenticated By: Natasha Mead, M.D. PFT- 10/04/2012-severe obstructive airways disease with insignificant response to bronchodilator, emphysema pattern. Air-trapping with increased residual volume. Diffusion moderately reduced. FVC 3.03/78%, FEV11.11/44%, FEV1/FVC 0.37. TLC 115%, RV 137%, DLCO 52%. - 10/04/2012-92%, 83%, 91%, 336 m. Significant desaturation with exertion.  12/20/12-79 yoM former smoker (60 pk yrs)referred courtesy of Dr Zachery Dauer because of emphysema. Wife is here. Smoked 60 pack years prior to 45. FOLLOWS FOR: Reports having a productive cough x2 weeks. Mucus is yellow. Has been more fatigued than normal and gets lightheaded from  time to time. Positive for chest tightness and SOB. Has used several OTC medications without relief.  12/30/12-79 yoM former smoker (60 pk yrs)referred courtesy of Dr Zachery Dauer because of emphysema. Wife is here. Smoked 60 pack years prior to 74. FOLLOWS FOR: mostly dry cough-at times will be productive-ivory in color. Denies any SOB or wheezing.  Dry cough persists. Overnight oximetry to 10/09/12 documented need for oxygen during sleep. We discussed oxygen therapy. He asks about maintenance steroid therapy and we discussed this carefully. O2 2L sleep/ APS.  03/24/13- 66 yoM former smoker (60 pk yrs)r followed for COPD/ emphysema. Wife is here. Smoked 60 pack years prior to 48. FOLLOWS ZOX:WRUEAV any SOB, wheezing or cough. Wife states patient has quite a bit of congestion (more in throat area) O2 2L sleep/ APS Note "gravelly " voice and throat congestion- speaking engagements pending. Scant white sputum, no infection. Felt better while on prednisone. Steroid talk done. Advair now 3rd tier.   06/23/13- 81 yoM former smoker (60 pk yrs)referred courtesy of Dr Zachery Dauer because of emphysema. Wife is here. Smoked 60 pack years prior to 41. FOLLOWS FOR: Pt states he continues to have SOB(? worsened with activity). Wife thinks dyspnea on exertion may be better. Rare cough. No acute events. Anoro did not help.  ROS-see HPI Constitutional:   No-   weight loss, night sweats, fevers, chills, fatigue, lassitude. HEENT:   No-  headaches, difficulty swallowing, tooth/dental problems, sore throat,       No-  sneezing, itching, ear ache, nasal congestion, post nasal drip,  CV:  No-   chest pain, orthopnea, PND, swelling in lower extremities, anasarca,  dizziness, palpitations Resp: +  shortness of breath with exertion or at rest.             No- productive cough,  No non-productive cough,  No- coughing up of blood.              No-   change in color of mucus.  No- wheezing.   Skin: No-   rash or  lesions. GI:  No-   heartburn, indigestion, abdominal pain, nausea, vomiting, GU: MS:  No-   joint pain or swelling. . Neuro-     nothing unusual Psych:  No- change in mood or affect. No depression or anxiety.  No memory loss.  OBJ- Physical Exam General- Alert, Oriented, Affect-appropriate, Distress- none acute, trim Skin- rash-none, lesions- none, excoriation- none, pale Lymphadenopathy- none Head- atraumatic            Eyes- Gross vision intact, PERRLA, conjunctivae and secretions clear            Ears- Hearing, canals-normal            Nose- Clear, no-Septal dev, mucus, polyps, erosion, perforation             Throat- Mallampati III , mucosa clear , drainage- none, tonsils- atrophic. No thrush Neck- flexible , trachea midline, no stridor , thyroid nl, carotid no bruit Chest - symmetrical excursion , unlabored           Heart/CV- RRR , no murmur , no gallop  , no rub, nl s1 s2                           - JVD- none , edema- none, stasis changes- none, varices- none           Lung- +distant but clear to P&A, wheeze- none, cough-none , dullness-none, rub- none.            Chest wall-  Abd-  Br/ Gen/ Rectal- Not done, not indicated Extrem- cyanosis- none, clubbing, none, atrophy- none, strength- nl Neuro- grossly intact to observation

## 2013-06-23 NOTE — Patient Instructions (Signed)
Sample Spiriva   1 daily    See if you notice any improvement in shortness of breath, especially with exertion

## 2013-06-26 ENCOUNTER — Telehealth: Payer: Self-pay | Admitting: Internal Medicine

## 2013-06-26 NOTE — Telephone Encounter (Signed)
I called and made spouse aware this is already documented in pt chart. Nothing further needed

## 2013-07-06 ENCOUNTER — Encounter: Payer: Self-pay | Admitting: Internal Medicine

## 2013-07-06 NOTE — Assessment & Plan Note (Signed)
Without exacerbation at this visit, controlled . Plan-Try Spiriva, if tolerated

## 2013-07-08 ENCOUNTER — Telehealth: Payer: Self-pay | Admitting: Internal Medicine

## 2013-07-08 NOTE — Telephone Encounter (Signed)
Per CY-ok to have the gentleman to come to the home(just ask him to clean his hands carefully on arrival; also have Jeffrey Meadows wash his own hands more often.

## 2013-07-08 NOTE — Telephone Encounter (Signed)
Called spoke with patient who stated that he and his wife have a gentleman that comes to their home every 2 weeks to help clean.  The gentleman's wife reportedly has MRSA, but pt is unsure where this infection is located -- he believes that it is on her leg.  Advised pt that the gentleman should be wearing gloves and washing his hands thoroughly after touching patient/articles of clothing that have come into contact with the infected area.  Pt stated that he would like to know if CDY thinks that the gentleman should come today (due this afternoon) d/t his medical history and COPD/Emphysema.    Last ov 11.10.14 w/ CDY: Patient Instructions    Sample Spiriva 1 daily See if you notice any improvement in shortness of breath, especially with exertion   Dr Maple Hudson please advise, thank you.

## 2013-07-08 NOTE — Telephone Encounter (Signed)
I called and made spouse aware nothing further needed

## 2013-08-20 ENCOUNTER — Telehealth: Payer: Self-pay | Admitting: Internal Medicine

## 2013-08-20 MED ORDER — AZITHROMYCIN 250 MG PO TABS: ORAL_TABLET | ORAL | Status: DC

## 2013-08-20 NOTE — Telephone Encounter (Signed)
Jeffrey Meadows has come down with URI symptoms. Terrible cough. No fever. Wife has to have hand surgery next week. Call in Zithromax Z-pak.

## 2013-08-21 ENCOUNTER — Telehealth: Payer: Self-pay | Admitting: Internal Medicine

## 2013-08-21 MED ORDER — AMOXICILLIN-POT CLAVULANATE 875-125 MG PO TABS: 1.0000 | ORAL_TABLET | Freq: Two times a day (BID) | ORAL | Status: DC

## 2013-08-21 MED ORDER — PREDNISONE 10 MG PO TABS: ORAL_TABLET | ORAL | Status: DC

## 2013-08-21 NOTE — Telephone Encounter (Signed)
Called and spoke with pts wife and she stated that the pt has been sick x 1 week.  Was given zpak and he took 2 tablets out of this and this caused problems with his prostate.  Pt has had the following problems x 1 week.  Cough with yellow sputum Nasal congestion that is clear Sore throat ?fever/chills  Pt was given pred taper and augmentin last may and pts wife stated that this worked great for the pt.  Requesting something be called to the pharmacy.  CY please advise. Thanks  Last ov--06/23/2013 Next ov--10/21/2013  Allergies  Allergen Reactions  . Other     ANTIHISTAMINES---Enlarged Prostate     Current Outpatient Prescriptions on File Prior to Visit  Medication Sig Dispense Refill  . ADVAIR DISKUS 500-50 MCG/DOSE AEPB INHALE ONE PUFF BY MOUTH EVERY TWELVE HOURS  1 each  3  . albuterol (PROAIR HFA) 108 (90 BASE) MCG/ACT inhaler Inhale 1-2 puffs into the lungs every 4 (four) hours as needed. as needed shortness of breath prior to exercise  1 Inhaler  11  . amLODipine (NORVASC) 10 MG tablet TAKE 1 TABLET BY MOUTH ONCE A DAY  90 tablet  3  . atorvastatin (LIPITOR) 20 MG tablet TAKE 1 TABLET BY MOUTH EVERY DAY  90 tablet  0  . azithromycin (ZITHROMAX Z-PAK) 250 MG tablet Take 2 tablets (500 mg) on  Day 1,  followed by 1 tablet (250 mg) once daily on Days 2 through 5.  6 each  0  . CALCIPOTRIENE EX Apply topically.      . cholecalciferol (VITAMIN D) 1000 UNITS tablet Take 1,000 Units by mouth daily.      . cyanocobalamin 1000 MCG tablet Take 5,000 mcg by mouth daily.       Marland Kitchen ELOCON 0.1 % lotion       . finasteride (PROSCAR) 5 MG tablet TAKE 1 TABLET BY MOUTH ONCE A DAY  90 tablet  3  . hydrochlorothiazide (HYDRODIURIL) 25 MG tablet TAKE 1 TABLET BY MOUTH EVERY MORNING  90 tablet  3  . HYDROcodone-acetaminophen (NORCO) 5-325 MG per tablet Take 1 tablet by mouth 2 (two) times daily as needed for pain (musculoskeletal pain).  60 tablet  0  . Tamsulosin HCl (FLOMAX) 0.4 MG CAPS Take 0.4 mg  by mouth daily.       Marland Kitchen therapeutic multivitamin-minerals (THERAGRAN-M) tablet Take 1 tablet by mouth daily.         No current facility-administered medications on file prior to visit.

## 2013-08-21 NOTE — Telephone Encounter (Signed)
Per CY - Prednisone 10mg  #20 4 x2, 3 x2, 2 x2, 1 x2 and Augmentin 875mg  #14 1 BID  Pt is aware of CY's recs. Rx will be sent in.

## 2013-08-22 ENCOUNTER — Telehealth: Payer: Self-pay | Admitting: Internal Medicine

## 2013-08-22 NOTE — Telephone Encounter (Signed)
Spoke with spouse. Calling to confirm prednisone taper directions: I advised her of the following: Sig: Take 4 tablets for 2 days, 3 tablets for 2 days, 2 tablets for 2 days, 1 tablet for 2 days   She voiced her understanding and needed nothing further

## 2013-08-25 ENCOUNTER — Telehealth: Payer: Self-pay | Admitting: Internal Medicine

## 2013-08-25 MED ORDER — TRAMADOL HCL 50 MG PO TABS: ORAL_TABLET | ORAL | Status: DC

## 2013-08-25 MED ORDER — PREDNISONE 10 MG PO TABS: ORAL_TABLET | ORAL | Status: DC

## 2013-08-25 MED ORDER — AMOXICILLIN-POT CLAVULANATE 875-125 MG PO TABS: 1.0000 | ORAL_TABLET | Freq: Two times a day (BID) | ORAL | Status: DC

## 2013-08-25 NOTE — Telephone Encounter (Signed)
Offer tramadol 50 mg, # 30, 1-2 every 6 hours if needed for cough

## 2013-08-25 NOTE — Telephone Encounter (Signed)
Called, spoke with pt's wife.  Informed her augmentin and pred refills were sent to [facility] and tramadol rx was called in by Leigh.  She verbalized understanding and is to call back if symptoms do not improve or worsen.

## 2013-08-25 NOTE — Telephone Encounter (Signed)
Called and spoke with pt and he stated that the pred taper helped some but did not clear all of this up.  He is still coughing with ivory sputum,  Unable to sleep at night due to the cough--he stated that the cough is worse when he lays down.  He has not been able to use his oxygen at night x 1 week due to the congestion and cough.  He denies any fever.  He stated that he will need something that will not affect his enlarged prostate.  CY please advise. Thanks  Last ov--06/23/2013 Next ov--10/21/2013  Allergies  Allergen Reactions  . Other     ANTIHISTAMINES---Enlarged Prostate     Current Outpatient Prescriptions on File Prior to Visit  Medication Sig Dispense Refill  . ADVAIR DISKUS 500-50 MCG/DOSE AEPB INHALE ONE PUFF BY MOUTH EVERY TWELVE HOURS  1 each  3  . albuterol (PROAIR HFA) 108 (90 BASE) MCG/ACT inhaler Inhale 1-2 puffs into the lungs every 4 (four) hours as needed. as needed shortness of breath prior to exercise  1 Inhaler  11  . amLODipine (NORVASC) 10 MG tablet TAKE 1 TABLET BY MOUTH ONCE A DAY  90 tablet  3  . amoxicillin-clavulanate (AUGMENTIN) 875-125 MG per tablet Take 1 tablet by mouth 2 (two) times daily.  14 tablet  0  . atorvastatin (LIPITOR) 20 MG tablet TAKE 1 TABLET BY MOUTH EVERY DAY  90 tablet  0  . azithromycin (ZITHROMAX Z-PAK) 250 MG tablet Take 2 tablets (500 mg) on  Day 1,  followed by 1 tablet (250 mg) once daily on Days 2 through 5.  6 each  0  . CALCIPOTRIENE EX Apply topically.      . cholecalciferol (VITAMIN D) 1000 UNITS tablet Take 1,000 Units by mouth daily.      . cyanocobalamin 1000 MCG tablet Take 5,000 mcg by mouth daily.       Marland Kitchen ELOCON 0.1 % lotion       . finasteride (PROSCAR) 5 MG tablet TAKE 1 TABLET BY MOUTH ONCE A DAY  90 tablet  3  . hydrochlorothiazide (HYDRODIURIL) 25 MG tablet TAKE 1 TABLET BY MOUTH EVERY MORNING  90 tablet  3  . HYDROcodone-acetaminophen (NORCO) 5-325 MG per tablet Take 1 tablet by mouth 2 (two) times daily as needed  for pain (musculoskeletal pain).  60 tablet  0  . predniSONE (DELTASONE) 10 MG tablet Take 4 tablets for 2 days, 3 tablets for 2 days, 2 tablets for 2 days, 1 tablet for 2 days  20 tablet  0  . Tamsulosin HCl (FLOMAX) 0.4 MG CAPS Take 0.4 mg by mouth daily.       Marland Kitchen therapeutic multivitamin-minerals (THERAGRAN-M) tablet Take 1 tablet by mouth daily.         No current facility-administered medications on file prior to visit.

## 2013-08-25 NOTE — Telephone Encounter (Signed)
Ok to refill the augmentin and pred Rx's from last time.

## 2013-08-25 NOTE — Telephone Encounter (Signed)
Called and spoke with pts wife and she is requesting to have refill of pred taper and augmentin sent in to the pharmacy.  CY please advise. thanks

## 2013-09-01 ENCOUNTER — Other Ambulatory Visit: Payer: Self-pay | Admitting: Internal Medicine

## 2013-09-02 ENCOUNTER — Telehealth: Payer: Self-pay | Admitting: Internal Medicine

## 2013-09-02 NOTE — Telephone Encounter (Signed)
According to EP[facility] rx for tramadol was called in 08/25/13 to [facility].  i spoke with the pharm but was advised they never received this via [facility] pharmacy. I gave VO. Nothing further needed.

## 2013-10-13 ENCOUNTER — Other Ambulatory Visit: Payer: 59 | Admitting: Internal Medicine

## 2013-10-13 DIAGNOSIS — E785 Hyperlipidemia, unspecified: Secondary | ICD-10-CM

## 2013-10-13 DIAGNOSIS — Z79899 Other long term (current) drug therapy: Secondary | ICD-10-CM

## 2013-10-14 ENCOUNTER — Ambulatory Visit (INDEPENDENT_AMBULATORY_CARE_PROVIDER_SITE_OTHER): Payer: Medicare Other | Admitting: Internal Medicine

## 2013-10-14 ENCOUNTER — Encounter: Payer: Self-pay | Admitting: Internal Medicine

## 2013-10-14 VITALS — BP 130/66 | HR 80 | Temp 97.7°F | Wt 160.0 lb

## 2013-10-14 DIAGNOSIS — Z8709 Personal history of other diseases of the respiratory system: Secondary | ICD-10-CM

## 2013-10-14 DIAGNOSIS — G250 Essential tremor: Secondary | ICD-10-CM

## 2013-10-14 DIAGNOSIS — E785 Hyperlipidemia, unspecified: Secondary | ICD-10-CM

## 2013-10-14 DIAGNOSIS — G252 Other specified forms of tremor: Secondary | ICD-10-CM

## 2013-10-14 DIAGNOSIS — I10 Essential (primary) hypertension: Secondary | ICD-10-CM

## 2013-10-14 LAB — HEPATIC FUNCTION PANEL
ALT: 18 U/L (ref 0–53)
AST: 17 U/L (ref 0–37)
Albumin: 4.3 g/dL (ref 3.5–5.2)
Alkaline Phosphatase: 62 U/L (ref 39–117)
Bilirubin, Direct: 0.2 mg/dL (ref 0.0–0.3)
Indirect Bilirubin: 0.6 mg/dL (ref 0.2–1.2)
Total Bilirubin: 0.8 mg/dL (ref 0.2–1.2)
Total Protein: 6.2 g/dL (ref 6.0–8.3)

## 2013-10-14 LAB — LIPID PANEL
Cholesterol: 157 mg/dL (ref 0–200)
HDL: 63 mg/dL (ref >39)
LDL Cholesterol: 79 mg/dL (ref 0–99)
Total CHOL/HDL Ratio: 2.5 Ratio
Triglycerides: 73 mg/dL (ref <150)
VLDL: 15 mg/dL (ref 0–40)

## 2013-10-14 NOTE — Progress Notes (Signed)
   Subjective:    Patient ID: Jeffrey Meadows, male    DOB: 1933/01/07, 80 y.o.   MRN: [id number]  HPI   Jeffrey Meadows comes in today for six-month recheck. He has a history of COPD, hypertension and hyperlipidemia. Wife has noticed that he has a fine tremor. He says he has not noticed it before. Says it does not interfere with his writing. He had fasting lipid panel which is entirely within normal limits on statin therapy. He has a high HDL of 63 which is good. His blood pressures under excellent control. He had a rather severe respiratory infection several weeks ago when wife is recovering from hand surgery. He says certain medications and affair with his prostate and he cannot urinate. We have to be careful with antihistamines. He did finally take a round of prednisone and that seemed to clear him up. He did have antibiotics as well.     Review of Systems     Objective:   Physical Exam he has a bilateral fine tremor. No cogwheel rigidity. His chest is clear. Cardiac exam regular rate and rhythm. Extremities without edema. Skin is warm and dry.        Assessment & Plan:   Hyperlipidemia-continue same medication  Essential tremor -patient does not want to be treated for that  Hypertension-stable on treatment  COPD-stable  Plan: We have to be aggressive when he gets an upper respiratory infection otherwise it tends to linger on. He will return in 6 months for physical examination. Otherwise continue same medication. Return as needed.

## 2013-10-14 NOTE — Patient Instructions (Signed)
Continue same medication and return in 6 months.

## 2013-10-15 ENCOUNTER — Other Ambulatory Visit: Payer: Self-pay | Admitting: Family Medicine

## 2013-10-20 ENCOUNTER — Other Ambulatory Visit: Payer: Self-pay | Admitting: *Deleted

## 2013-10-20 ENCOUNTER — Other Ambulatory Visit: Payer: Self-pay | Admitting: Family Medicine

## 2013-10-20 DIAGNOSIS — I739 Peripheral vascular disease, unspecified: Secondary | ICD-10-CM

## 2013-10-21 ENCOUNTER — Ambulatory Visit (INDEPENDENT_AMBULATORY_CARE_PROVIDER_SITE_OTHER): Payer: Medicare Other | Admitting: Internal Medicine

## 2013-10-21 ENCOUNTER — Encounter: Payer: Self-pay | Admitting: Internal Medicine

## 2013-10-21 VITALS — BP 112/62 | HR 81 | Ht 68.0 in | Wt 162.8 lb

## 2013-10-21 DIAGNOSIS — Z87891 Personal history of nicotine dependence: Secondary | ICD-10-CM

## 2013-10-21 DIAGNOSIS — J449 Chronic obstructive pulmonary disease, unspecified: Secondary | ICD-10-CM

## 2013-10-21 DIAGNOSIS — J439 Emphysema, unspecified: Secondary | ICD-10-CM

## 2013-10-21 DIAGNOSIS — J438 Other emphysema: Secondary | ICD-10-CM

## 2013-10-21 MED ORDER — FLUTICASONE-SALMETEROL 250-50 MCG/DOSE IN AEPB: INHALATION_SPRAY | RESPIRATORY_TRACT | Status: DC

## 2013-10-21 NOTE — Progress Notes (Signed)
08/29/12- 69 yoM former smoker (60 pk yrs)referred courtesy of Dr Candelaria Celeste because of emphysema. Wife is here. Smoked 60 pack years prior to 72. He has been aware of dyspnea on exertion a diagnosis of emphysema for about 4 years. There has been gradual progression. Little cough except with colds. He is okay with activities of daily living and his breathing does not wake. He exercises regularly for his hip and leg with an exercycle. Up to date on flu and pneumonia vaccines He denies history of asthma or pneumonia but has had some seasonal allergic rhinitis. No heart disease or anemia. Family history is negative for lung disease. 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 CXR 01/13/10 IMPRESSION:  COPD/chronic changes.  Provider: Felipe Drone  10/04/12- 79 yoM former smoker (60 pk yrs)referred courtesy of Dr Candelaria Celeste because of emphysema. Wife is here. Smoked 60 pack years prior to 62. FOLLOWS FOR: review PFT and 6MW results with patient.  He reports no change since last here. He tries to walk on a treadmill and I emphasized the value of maintaining aerobic activity. He has enlarged prostate so we are avoiding Spiriva. CXR 09/11/12-IMPRESSION:  Stable COPD. No active disease.  Original Report Authenticated By: Lahoma Crocker, M.D. PFT- 10/04/2012-severe obstructive airways disease with insignificant response to bronchodilator, emphysema pattern. Air-trapping with increased residual volume. Diffusion moderately reduced. FVC 3.03/78%, FEV11.11/44%, FEV1/FVC 0.37. TLC 115%, RV 137%, DLCO 52%. 6MWT- 10/04/2012-92%, 83%, 91%, 336 m. Significant desaturation with exertion.  12/20/12-79 yoM former smoker (60 pk yrs)referred courtesy of Dr Candelaria Celeste because of emphysema. Wife is here. Smoked 60 pack years prior to 23. FOLLOWS FOR: Reports having a productive cough x2 weeks. Mucus is yellow. Has been more fatigued than normal and gets lightheaded from  time to time. Positive for chest tightness and SOB. Has used several OTC medications without relief.  12/30/12-79 yoM former smoker (60 pk yrs)referred courtesy of Dr Candelaria Celeste because of emphysema. Wife is here. Smoked 60 pack years prior to 7. FOLLOWS FOR: mostly dry cough-at times will be productive-ivory in color. Denies any SOB or wheezing.  Dry cough persists. Overnight oximetry to 10/09/12 documented need for oxygen during sleep. We discussed oxygen therapy. He asks about maintenance steroid therapy and we discussed this carefully. O2 2L sleep/ APS.  03/24/13- 45 yoM former smoker (60 pk yrs)r followed for COPD/ emphysema. Wife is here. Smoked 60 pack years prior to 19. FOLLOWS FXT:KWIOXB any SOB, wheezing or cough. Wife states patient has quite a bit of congestion (more in throat area) O2 2L sleep/ APS Note "gravelly " voice and throat congestion- speaking engagements pending. Scant white sputum, no infection. Felt better while on prednisone. Steroid talk done. Advair now 3rd tier.   06/23/13- 72 yoM former smoker (60 pk yrs)referred courtesy of Dr Candelaria Celeste because of emphysema. Wife is here. Smoked 60 pack years prior to 56. FOLLOWS FOR: Pt states he continues to have SOB(? worsened with activity). Wife thinks dyspnea on exertion may be better. Rare cough. No acute events. Anoro did not help.  10/22/13- 89 yoM former smoker (60 pk yrs)referred courtesy of Dr Candelaria Celeste because of emphysema. Wife is here. Smoked 60 pack years prior to 58. FOLLOWS FOR: Pt denies any SOB or wheezing. Pt states he continues to wear O2 at night but has to take it off at times due to dryness. They deny cough or wheeze. No acute change. Little exercise tolerance.  ROS-see HPI Constitutional:   No-   weight loss, night sweats, fevers, chills, fatigue, lassitude. HEENT:   No-  headaches, difficulty swallowing, tooth/dental problems, sore throat,       No-  sneezing, itching, ear ache, nasal  congestion, post nasal drip,  CV:  No-   chest pain, orthopnea, PND, swelling in lower extremities, anasarca,  dizziness, palpitations Resp: +  shortness of breath with exertion or at rest.             No- productive cough,  No non-productive cough,  No- coughing up of blood.              No-   change in color of mucus.  No- wheezing.   Skin: No-   rash or lesions. GI:  No-   heartburn, indigestion, abdominal pain, nausea, vomiting, GU: MS:  No-   joint pain or swelling. . Neuro-     nothing unusual Psych:  No- change in mood or affect. No depression or anxiety.  No memory loss.  OBJ- Physical Exam General- Alert, Oriented, Affect-appropriate, Distress- none acute, trim Skin- rash-none, lesions- none, excoriation- none, pale Lymphadenopathy- none Head- atraumatic            Eyes- Gross vision intact, PERRLA, conjunctivae and secretions clear            Ears- Hearing, canals-normal            Nose- Clear, no-Septal dev, mucus, polyps, erosion, perforation             Throat- Mallampati III , mucosa clear , drainage- none, tonsils- atrophic. No thrush Neck- flexible , trachea midline, no stridor , thyroid nl, carotid no bruit Chest - symmetrical excursion , unlabored           Heart/CV- RRR , no murmur , no gallop  , no rub, nl s1 s2                           - JVD- none , edema- none, stasis changes- none, varices- none           Lung- +distant but clear to P&A, wheeze- none, cough-none , dullness-none, rub- none.            Chest wall-  Abd-  Br/ Gen/ Rectal- Not done, not indicated Extrem- cyanosis- none, clubbing, none, atrophy- none, strength- nl Neuro- grossly intact to observation

## 2013-10-21 NOTE — Patient Instructions (Signed)
Order- APS- add humidifier to oxygen concentrator   Dx COPD  Script to change Advair from 500 to 250. Continue 1 puff, then rinse, twice daily

## 2013-11-08 NOTE — Assessment & Plan Note (Signed)
Plan-change Advair from 500-250. DME APS to add humidifier to his oxygen concentrator

## 2013-11-08 NOTE — Assessment & Plan Note (Signed)
Remains off of tobacco

## 2013-11-18 ENCOUNTER — Inpatient Hospital Stay (HOSPITAL_COMMUNITY): Admission: RE | Admit: 2013-11-18 | Payer: Medicare Other | Source: Ambulatory Visit

## 2013-11-18 ENCOUNTER — Ambulatory Visit: Payer: Medicare Other | Admitting: Vascular Surgery

## 2013-11-18 ENCOUNTER — Other Ambulatory Visit (HOSPITAL_COMMUNITY): Payer: Medicare Other

## 2013-12-01 ENCOUNTER — Encounter: Payer: Self-pay | Admitting: Family

## 2013-12-02 ENCOUNTER — Encounter: Payer: Self-pay | Admitting: Family

## 2013-12-02 ENCOUNTER — Ambulatory Visit (HOSPITAL_COMMUNITY)
Admission: RE | Admit: 2013-12-02 | Discharge: 2013-12-02 | Disposition: A | Discharge status: Home / Self Care | Payer: Medicare Other | Source: Ambulatory Visit | Attending: Family | Admitting: Family

## 2013-12-02 ENCOUNTER — Ambulatory Visit (INDEPENDENT_AMBULATORY_CARE_PROVIDER_SITE_OTHER): Payer: Medicare Other | Admitting: Family

## 2013-12-02 ENCOUNTER — Ambulatory Visit (INDEPENDENT_AMBULATORY_CARE_PROVIDER_SITE_OTHER)
Admission: RE | Admit: 2013-12-02 | Discharge: 2013-12-02 | Disposition: A | Discharge status: Home / Self Care | Payer: Medicare Other | Source: Ambulatory Visit

## 2013-12-02 VITALS — BP 145/78 | HR 74 | Resp 18 | Ht 68.0 in | Wt 160.1 lb

## 2013-12-02 DIAGNOSIS — Z48812 Encounter for surgical aftercare following surgery on the circulatory system: Secondary | ICD-10-CM | POA: Insufficient documentation

## 2013-12-02 DIAGNOSIS — I739 Peripheral vascular disease, unspecified: Secondary | ICD-10-CM

## 2013-12-02 NOTE — Addendum Note (Signed)
Addended by: Dorthula Rue L on: 12/02/2013 04:30 PM   Modules accepted: Orders

## 2013-12-02 NOTE — Progress Notes (Signed)
[facility] [place] HISTORY AND PHYSICAL -PAD  History of Present Illness Jeffrey Meadows is a 80 y.o. male who is s/p left distal SFA popliteal artery bypass graft that was completed in March 2006 by Dr. Kellie Simmering.    Pt. denies claudication in legs with walking, denies non healing wounds.  He does have psoriasis.  He denies any history of stroke or TIA. The patient denies New Medical or Surgical History.  Pt Diabetic: No Pt smoker: former smoker, quit in the 1980's. Knees and hips are painful with walking.  Pt meds include: Statin :Yes ASA: No Other anticoagulants/antiplatelets: no  Past Medical History  Diagnosis Date  . Villous adenoma of colon 11/.2001    2 cm removed cecum  . Adenomatous polyp of colon 08/2001    removed hepatic flexure  . Hypertension   . Hyperlipidemia   . Raynaud disease     Social History History  Substance Use Topics  . Smoking status: Former Smoker -- 1.50 packs/day for 40 years    Types: Cigarettes    Quit date: 07/30/1986  . Smokeless tobacco: Not on file  . Alcohol Use: 12.0 - 21.0 oz/week    20-35 Glasses of wine per week     Comment: 3-5 glasses of wine daily, lost license DUI    Family History Family History  Problem Relation Age of Onset  . Heart failure Mother   . Heart disease Mother   . Aneurysm Father     Past Surgical History  Procedure Laterality Date  . Prostate biopsy  4/95  . Total hip arthroplasty  1952    right  . Hernia repair  7/87  . 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 Joint replacement      Right Hip    Allergies  Allergen Reactions  . Other     ANTIHISTAMINES---Enlarged Prostate    Current Outpatient Prescriptions  Medication Sig Dispense Refill  . albuterol (PROAIR HFA) 108 (90 BASE) MCG/ACT inhaler Inhale 1-2 puffs into the lungs every 4 (four) hours as needed. as needed shortness of breath prior to exercise  1 Inhaler  11  . amLODipine (NORVASC) 10 MG tablet  TAKE 1 TABLET BY MOUTH ONCE A DAY  90 tablet  3  . aspirin 325 MG tablet Take 2 tablets by mouth prior to working out      . atorvastatin (LIPITOR) 20 MG tablet TAKE 1 TABLET BY MOUTH EVERY DAY  90 tablet  0  . CALCIPOTRIENE EX Apply topically.      . cholecalciferol (VITAMIN D) 1000 UNITS tablet Take 1,000 Units by mouth daily.      . cyanocobalamin 1000 MCG tablet Take 5,000 mcg by mouth daily.       Marland Kitchen ELOCON 0.1 % lotion       . finasteride (PROSCAR) 5 MG tablet TAKE 1 TABLET BY MOUTH ONCE A DAY  90 tablet  3  . Fluticasone-Salmeterol (ADVAIR DISKUS) 250-50 MCG/DOSE AEPB 1 puff then rinse mouth, twice daily  60 each  5  . hydrochlorothiazide (HYDRODIURIL) 25 MG tablet TAKE 1 TABLET BY MOUTH EVERY MORNING  90 tablet  3  . Tamsulosin HCl (FLOMAX) 0.4 MG CAPS Take 0.4 mg by mouth daily.       Marland Kitchen therapeutic multivitamin-minerals (THERAGRAN-M) tablet Take 1 tablet by mouth daily.         No current facility-administered medications for this visit.    ROS: See HPI for pertinent positives and negatives.  Physical Examination  Filed Vitals:   12/02/13 1418  BP: 145/78  Pulse: 74  Resp: 18   Filed Weights   12/02/13 1418  Weight: 160 lb 1.6 oz (72.621 kg)   Body mass index is 24.35 kg/(m^2).   General: A&O x 3, WDWN. Gait: normal Eyes: PERRLA. Pulmonary: CTAB, without wheezes , rales or rhonchi. Cardiac: regular Rythm , without detected murmur.         Carotid Bruits Left Right   Negative Negative  Aorta is not palpable. Radial pulses: are 2+ palpable and =.                           VASCULAR EXAM: Extremities without ischemic changes  without Gangrene; without open wounds.                                                                                                          LE Pulses LEFT RIGHT       FEMORAL   palpable  not palpable        POPLITEAL  not palpable   not palpable       POSTERIOR TIBIAL  not palpable    palpable        DORSALIS PEDIS       ANTERIOR TIBIAL  palpable  not palpable    Abdomen: soft, NT, no masses. Skin: red macular psoriasis patches on anterior lower legs, no ulcers noted. Musculoskeletal: no muscle wasting or atrophy.  Neurologic: A&O X 3; Appropriate Affect ; SENSATION: normal; MOTOR FUNCTION:  moving all extremities equally, motor strength 5/5 throughout. Speech is fluent/normal. CN 2-12 intact. Fine tremor in outstretched fingers.  Non-Invasive Vascular Imaging: DATE: 12/02/2013 ABI: RIGHT 1.11, Waveforms: triphasic;  LEFT 1.07, Waveforms: triphasic DUPLEX SCAN OF BYPASS: Patent graft without evidence of stenosis. Velocities in graft are dampened.   ASSESSMENT: Jeffrey Meadows is a 80 y.o. male who is s/p left distal SFA popliteal artery bypass graft that was completed in March 2006 presents with no claudication symptoms, normal bilateral ABI's with triphasic waveforms, but ABI's indicate the presence of bilateral tibial artery medial calcification. The bilateral DPA is non-compressible.  Patent graft without evidence of stenosis. Velocities in graft are dampened.   PLAN:  I discussed in depth with the patient the nature of atherosclerosis, and emphasized the importance of maximal medical management including strict control of blood pressure, blood glucose, and lipid levels, obtaining regular exercise, and continued cessation of smoking.  The patient is aware that without maximal medical management the underlying atherosclerotic disease process will progress, limiting the benefit of any interventions.  Based on the patient's vascular studies and examination, pt will return to clinic in 1 year for ABI's and left LE arterial Duplex.  The patient was given information about PAD including signs, symptoms, treatment, what symptoms should prompt the patient to seek immediate medical care, and risk reduction measures to take.  Clemon Chambers, RN, MSN, FNP-C [facility]  Phone: [phone]  Clinic MD:  Early  12/02/2013 2:51 PM

## 2013-12-02 NOTE — Patient Instructions (Signed)
Peripheral Vascular Disease Peripheral Vascular Disease (PVD), also called Peripheral Arterial Disease (PAD), is a circulation problem caused by cholesterol (atherosclerotic plaque) deposits in the arteries. PVD commonly occurs in the lower extremities (legs) but it can occur in other areas of the body, such as your arms. The cholesterol buildup in the arteries reduces blood flow which can cause pain and other serious problems. The presence of PVD can place a person at risk for Coronary Artery Disease (CAD).  CAUSES  Causes of PVD can be many. It is usually associated with more than one risk factor such as:   High Cholesterol.  Smoking.  Diabetes.  Lack of exercise or inactivity.  High blood pressure (hypertension).  Obesity.  Family history. SYMPTOMS   When the lower extremities are affected, patients with PVD may experience:  Leg pain with exertion or physical activity. This is called INTERMITTENT CLAUDICATION. This may present as cramping or numbness with physical activity. The location of the pain is associated with the level of blockage. For example, blockage at the abdominal level (distal abdominal aorta) may result in buttock or hip pain. Lower leg arterial blockage may result in calf pain.  As PVD becomes more severe, pain can develop with less physical activity.  In people with severe PVD, leg pain may occur at rest.  Other PVD signs and symptoms:  Leg numbness or weakness.  Coldness in the affected leg or foot, especially when compared to the other leg.  A change in leg color.  Patients with significant PVD are more prone to ulcers or sores on toes, feet or legs. These may take longer to heal or may reoccur. The ulcers or sores can become infected.  If signs and symptoms of PVD are ignored, gangrene may occur. This can result in the loss of toes or loss of an entire limb.  Not all leg pain is related to PVD. Other medical conditions can cause leg pain such  as:  Blood clots (embolism) or Deep Vein Thrombosis.  Inflammation of the blood vessels (vasculitis).  Spinal stenosis. DIAGNOSIS  Diagnosis of PVD can involve several different types of tests. These can include:  Pulse Volume Recording Method (PVR). This test is simple, painless and does not involve the use of X-rays. PVR involves measuring and comparing the blood pressure in the arms and legs. An ABI (Ankle-Brachial Index) is calculated. The normal ratio of blood pressures is 1. As this number becomes smaller, it indicates more severe disease.  < 0.95  indicates significant narrowing in one or more leg vessels.  <0.8 there will usually be pain in the foot, leg or buttock with exercise.  <0.4 will usually have pain in the legs at rest.  <0.25  usually indicates limb threatening PVD.  Doppler detection of pulses in the legs. This test is painless and checks to see if you have a pulses in your legs/feet.  A dye or contrast material (a substance that highlights the blood vessels so they show up on x-ray) may be given to help your caregiver better see the arteries for the following tests. The dye is eliminated from your body by the kidney's. Your caregiver may order blood work to check your kidney function and other laboratory values before the following tests are performed:  Magnetic Resonance Angiography (MRA). An MRA is a picture study of the blood vessels and arteries. The MRA machine uses a large magnet to produce images of the blood vessels.  Computed Tomography Angiography (CTA). A CTA is a   specialized x-ray that looks at how the blood flows in your blood vessels. An IV may be inserted into your arm so contrast dye can be injected.  Angiogram. Is a procedure that uses x-rays to look at your blood vessels. This procedure is minimally invasive, meaning a small incision (cut) is made in your groin. A small tube (catheter) is then inserted into the artery of your groin. The catheter is  guided to the blood vessel or artery your caregiver wants to examine. Contrast dye is injected into the catheter. X-rays are then taken of the blood vessel or artery. After the images are obtained, the catheter is taken out. TREATMENT  Treatment of PVD involves many interventions which may include:  Lifestyle changes:  Quitting smoking.  Exercise.  Following a low fat, low cholesterol diet.  Control of diabetes.  Foot care is very important to the PVD patient. Good foot care can help prevent infection.  Medication:  Cholesterol-lowering medicine.  Blood pressure medicine.  Anti-platelet drugs.  Certain medicines may reduce symptoms of Intermittent Claudication.  Interventional/Surgical options:  Angioplasty. An Angioplasty is a procedure that inflates a balloon in the blocked artery. This opens the blocked artery to improve blood flow.  Stent Implant. A wire mesh tube (stent) is placed in the artery. The stent expands and stays in place, allowing the artery to remain open.  Peripheral Bypass Surgery. This is a surgical procedure that reroutes the blood around a blocked artery to help improve blood flow. This type of procedure may be performed if Angioplasty or stent implants are not an option. SEEK IMMEDIATE MEDICAL CARE IF:   You develop pain or numbness in your arms or legs.  Your arm or leg turns cold, becomes blue in color.  You develop redness, warmth, swelling and pain in your arms or legs. MAKE SURE YOU:   Understand these instructions.  Will watch your condition.  Will get help right away if you are not doing well or get worse. Document Released: 09/07/2004 Document Revised: 10/23/2011 Document Reviewed: 08/04/2008 ExitCare Patient Information 2014 [employer].  

## 2013-12-03 ENCOUNTER — Other Ambulatory Visit: Payer: Self-pay

## 2013-12-03 DIAGNOSIS — I739 Peripheral vascular disease, unspecified: Secondary | ICD-10-CM

## 2013-12-03 DIAGNOSIS — Z48812 Encounter for surgical aftercare following surgery on the circulatory system: Secondary | ICD-10-CM

## 2014-01-12 ENCOUNTER — Other Ambulatory Visit: Payer: Self-pay | Admitting: Family Medicine

## 2014-01-13 ENCOUNTER — Other Ambulatory Visit: Payer: Self-pay | Admitting: Family Medicine

## 2014-01-14 ENCOUNTER — Other Ambulatory Visit: Payer: Self-pay

## 2014-01-14 MED ORDER — FINASTERIDE 5 MG PO TABS: 5.0000 mg | ORAL_TABLET | Freq: Every day | ORAL | Status: DC

## 2014-01-14 MED ORDER — ATORVASTATIN CALCIUM 20 MG PO TABS: 20.0000 mg | ORAL_TABLET | Freq: Every day | ORAL | Status: DC

## 2014-01-14 MED ORDER — AMLODIPINE BESYLATE 10 MG PO TABS: 10.0000 mg | ORAL_TABLET | Freq: Every day | ORAL | Status: DC

## 2014-03-16 ENCOUNTER — Telehealth: Payer: Self-pay | Admitting: Internal Medicine

## 2014-03-16 NOTE — Telephone Encounter (Signed)
Ask him in him to drop by so we can discuss.

## 2014-03-17 NOTE — Telephone Encounter (Signed)
LMOM for patient to come by on Wednesday at 11 a.m. To speak with Dr. Renold Genta in this regard.  Patient instructed to call me back to confirm date/time.

## 2014-03-30 ENCOUNTER — Other Ambulatory Visit: Payer: Self-pay

## 2014-03-30 ENCOUNTER — Telehealth: Payer: Self-pay | Admitting: Internal Medicine

## 2014-03-30 MED ORDER — MOMETASONE FUROATE 0.1 % EX SOLN
Freq: Two times a day (BID) | CUTANEOUS | Status: DC | PRN
Start: 2014-03-30 — End: 2016-03-07

## 2014-03-30 NOTE — Telephone Encounter (Signed)
Please refill x one year 

## 2014-03-31 NOTE — Telephone Encounter (Signed)
rx done by Vaughan Basta

## 2014-04-22 ENCOUNTER — Ambulatory Visit: Payer: Medicare Other | Admitting: Internal Medicine

## 2014-04-27 ENCOUNTER — Other Ambulatory Visit: Payer: 59 | Admitting: Internal Medicine

## 2014-04-27 DIAGNOSIS — E785 Hyperlipidemia, unspecified: Secondary | ICD-10-CM

## 2014-04-27 DIAGNOSIS — Z125 Encounter for screening for malignant neoplasm of prostate: Secondary | ICD-10-CM

## 2014-04-27 DIAGNOSIS — I10 Essential (primary) hypertension: Secondary | ICD-10-CM

## 2014-04-27 DIAGNOSIS — Z130 Encounter for screening for diseases of the blood and blood-forming organs and certain disorders involving the immune mechanism: Secondary | ICD-10-CM

## 2014-04-27 DIAGNOSIS — Z0000 Encounter for general adult medical examination without abnormal findings: Secondary | ICD-10-CM

## 2014-04-27 LAB — COMPREHENSIVE METABOLIC PANEL
ALT: 21 U/L (ref 0–53)
AST: 20 U/L (ref 0–37)
Albumin: 4.2 g/dL (ref 3.5–5.2)
Alkaline Phosphatase: 60 U/L (ref 39–117)
BUN: 10 mg/dL (ref 6–23)
CO2: 31 mEq/L (ref 19–32)
Calcium: 9.8 mg/dL (ref 8.4–10.5)
Chloride: 101 mEq/L (ref 96–112)
Creat: 0.76 mg/dL (ref 0.50–1.35)
Glucose, Bld: 102 mg/dL — ABNORMAL HIGH (ref 70–99)
Potassium: 3.4 mEq/L — ABNORMAL LOW (ref 3.5–5.3)
Sodium: 141 mEq/L (ref 135–145)
Total Bilirubin: 1.2 mg/dL (ref 0.2–1.2)
Total Protein: 6.2 g/dL (ref 6.0–8.3)

## 2014-04-27 LAB — CBC WITH DIFFERENTIAL/PLATELET
Basophils Absolute: 0.1 10*3/uL (ref 0.0–0.1)
Basophils Relative: 1 % (ref 0–1)
Eosinophils Absolute: 0.1 10*3/uL (ref 0.0–0.7)
Eosinophils Relative: 1 % (ref 0–5)
HCT: 44.3 % (ref 39.0–52.0)
Hemoglobin: 15.5 g/dL (ref 13.0–17.0)
Lymphocytes Relative: 36 % (ref 12–46)
Lymphs Abs: 2.9 10*3/uL (ref 0.7–4.0)
MCH: 31.6 pg (ref 26.0–34.0)
MCHC: 35 g/dL (ref 30.0–36.0)
MCV: 90.2 fL (ref 78.0–100.0)
Monocytes Absolute: 0.7 10*3/uL (ref 0.1–1.0)
Monocytes Relative: 9 % (ref 3–12)
Neutro Abs: 4.2 10*3/uL (ref 1.7–7.7)
Neutrophils Relative %: 53 % (ref 43–77)
Platelets: 246 10*3/uL (ref 150–400)
RBC: 4.91 MIL/uL (ref 4.22–5.81)
RDW: 13.1 % (ref 11.5–15.5)
WBC: 8 10*3/uL (ref 4.0–10.5)

## 2014-04-27 LAB — LIPID PANEL
Cholesterol: 145 mg/dL (ref 0–200)
HDL: 62 mg/dL (ref >39)
LDL Cholesterol: 63 mg/dL (ref 0–99)
Total CHOL/HDL Ratio: 2.3 Ratio
Triglycerides: 101 mg/dL (ref <150)
VLDL: 20 mg/dL (ref 0–40)

## 2014-04-28 LAB — PSA: PSA: 1.58 ng/mL (ref <=4.00)

## 2014-04-30 ENCOUNTER — Encounter: Payer: Medicare Other | Admitting: Internal Medicine

## 2014-05-01 ENCOUNTER — Encounter: Payer: Self-pay | Admitting: Internal Medicine

## 2014-05-01 ENCOUNTER — Ambulatory Visit (INDEPENDENT_AMBULATORY_CARE_PROVIDER_SITE_OTHER): Payer: 59 | Admitting: Internal Medicine

## 2014-05-01 VITALS — BP 133/68 | HR 80 | Ht 68.0 in | Wt 160.0 lb

## 2014-05-01 DIAGNOSIS — I10 Essential (primary) hypertension: Secondary | ICD-10-CM

## 2014-05-01 DIAGNOSIS — Z0000 Encounter for general adult medical examination without abnormal findings: Secondary | ICD-10-CM

## 2014-05-01 DIAGNOSIS — Z23 Encounter for immunization: Secondary | ICD-10-CM

## 2014-05-01 DIAGNOSIS — M25559 Pain in unspecified hip: Secondary | ICD-10-CM

## 2014-05-01 DIAGNOSIS — N400 Enlarged prostate without lower urinary tract symptoms: Secondary | ICD-10-CM

## 2014-05-01 DIAGNOSIS — E785 Hyperlipidemia, unspecified: Secondary | ICD-10-CM

## 2014-05-01 DIAGNOSIS — M25552 Pain in left hip: Secondary | ICD-10-CM

## 2014-05-01 DIAGNOSIS — E876 Hypokalemia: Secondary | ICD-10-CM

## 2014-05-01 DIAGNOSIS — Z8709 Personal history of other diseases of the respiratory system: Secondary | ICD-10-CM

## 2014-05-01 DIAGNOSIS — I70209 Unspecified atherosclerosis of native arteries of extremities, unspecified extremity: Secondary | ICD-10-CM

## 2014-05-01 DIAGNOSIS — G8929 Other chronic pain: Secondary | ICD-10-CM

## 2014-05-01 LAB — POCT URINALYSIS DIPSTICK
Bilirubin, UA: NEGATIVE
Blood, UA: NEGATIVE
Glucose, UA: NEGATIVE
Ketones, UA: NEGATIVE
Leukocytes, UA: NEGATIVE
Nitrite, UA: NEGATIVE
Protein, UA: NEGATIVE
Spec Grav, UA: 1.01
Urobilinogen, UA: NEGATIVE
pH, UA: 6.5

## 2014-05-01 MED ORDER — HYDROCODONE-ACETAMINOPHEN 5-325 MG PO TABS: 1.0000 | ORAL_TABLET | Freq: Four times a day (QID) | ORAL | Status: DC | PRN

## 2014-05-01 MED ORDER — POTASSIUM CHLORIDE ER 10 MEQ PO TBCR: 10.0000 meq | EXTENDED_RELEASE_TABLET | Freq: Two times a day (BID) | ORAL | Status: DC

## 2014-05-03 ENCOUNTER — Other Ambulatory Visit: Payer: Self-pay | Admitting: Internal Medicine

## 2014-05-12 ENCOUNTER — Other Ambulatory Visit: Payer: 59 | Admitting: Internal Medicine

## 2014-05-12 DIAGNOSIS — E785 Hyperlipidemia, unspecified: Secondary | ICD-10-CM

## 2014-05-12 DIAGNOSIS — Z79899 Other long term (current) drug therapy: Secondary | ICD-10-CM

## 2014-05-12 DIAGNOSIS — I10 Essential (primary) hypertension: Secondary | ICD-10-CM

## 2014-05-12 LAB — POTASSIUM: Potassium: 3.7 mEq/L (ref 3.5–5.3)
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
## 2014-05-13 NOTE — Patient Instructions (Signed)
Start potassium supplementation 10 mEq daily and return in 2 weeks for potassium level to be drawn. Hydrocodone/APAP given for chronic pain.

## 2014-05-14 ENCOUNTER — Telehealth: Payer: Self-pay

## 2014-05-14 NOTE — Telephone Encounter (Signed)
Informed patients wife of potassium results.  They are aware to increase from 10 to 20 mEq.  Follow up lab appointment scheduled.

## 2014-05-14 NOTE — Telephone Encounter (Signed)
Message copied by Amado Coe on Thu May 14, 2014 10:52 AM ------      Message from: [name]      Created: Wed May 13, 2014 11:05 PM       Increase potassium to 20 mEq daily instead of 10 mEq daily and recheck in 2 weeks. May call in 20 mEq tablets. He can take 2 of the 10 mEq tablets until that prescription is finished. ------

## 2014-05-24 ENCOUNTER — Other Ambulatory Visit: Payer: Self-pay | Admitting: Internal Medicine

## 2014-05-25 ENCOUNTER — Other Ambulatory Visit: Payer: Self-pay

## 2014-05-25 ENCOUNTER — Telehealth: Payer: Self-pay | Admitting: Internal Medicine

## 2014-05-25 DIAGNOSIS — M5416 Radiculopathy, lumbar region: Secondary | ICD-10-CM

## 2014-05-25 DIAGNOSIS — M25552 Pain in left hip: Secondary | ICD-10-CM

## 2014-05-25 MED ORDER — HYDROCODONE-ACETAMINOPHEN 5-325 MG PO TABS: 1.0000 | ORAL_TABLET | Freq: Three times a day (TID) | ORAL | Status: DC

## 2014-05-25 NOTE — Telephone Encounter (Signed)
Refill Norco 5/325. Schedule MRI dx radiculopathy and back pain

## 2014-05-25 NOTE — Telephone Encounter (Signed)
Needs refill Rx on Vicodin/Norco 5-325mg .  Take 1 every 6 hours as needed for moderate pain.  Would like to pick up on Tuesday a.m.  Also, wife states that he needs to go ahead and schedule the MRI.  Advised that we would have to order it and then have it authorized with insurance.  Will provide her with additional information when she picks up Rx on Tuesday.

## 2014-05-26 NOTE — Telephone Encounter (Signed)
Hydrocodone rx printed.  Patient wife picked up.  Will work on MRI.  Patient wife aware.

## 2014-05-26 NOTE — Telephone Encounter (Signed)
MRI approved [id number].  MRI scheduled at [place] on 05/28/14 at 36 am at [facility].  Patient wife informed

## 2014-05-27 ENCOUNTER — Telehealth: Payer: Self-pay | Admitting: Internal Medicine

## 2014-05-27 NOTE — Telephone Encounter (Signed)
Constipated for one week with pain medication. Having back pain down both thighs. Is to have an MRI on Thursday, October 15. Advise go to emergency department. They called paramedics. Paramedics advised fleets enema. They were not able to get that last evening. They'll try to get that today along with Dulcolax suppository. He is asleep now. Took another pain pill. Spoke with wife this morning. We may need to disimpact him. I am somewhat concerned about possible acute compression fracture with this type of pain exacerbation.

## 2014-05-28 ENCOUNTER — Ambulatory Visit
Admission: RE | Admit: 2014-05-28 | Discharge: 2014-05-28 | Disposition: A | Discharge status: Home / Self Care | Payer: Medicare Other | Source: Ambulatory Visit | Attending: Internal Medicine | Admitting: Internal Medicine

## 2014-05-28 ENCOUNTER — Other Ambulatory Visit: Payer: Medicare Other

## 2014-05-28 ENCOUNTER — Telehealth: Payer: Self-pay | Admitting: Internal Medicine

## 2014-05-28 ENCOUNTER — Telehealth: Payer: Self-pay

## 2014-05-28 DIAGNOSIS — M7130 Other bursal cyst, unspecified site: Secondary | ICD-10-CM

## 2014-05-28 DIAGNOSIS — M5416 Radiculopathy, lumbar region: Secondary | ICD-10-CM

## 2014-05-28 DIAGNOSIS — M25552 Pain in left hip: Secondary | ICD-10-CM

## 2014-05-28 MED ORDER — PREDNISONE 10 MG PO KIT: PACK | ORAL | Status: DC

## 2014-05-28 NOTE — Telephone Encounter (Signed)
Large synovial cyst L2-L3. Likely will require surgical intervention. Causing stenosis. Appointment with Dr. Vertell Limber. Start prednisone DS 10 mg 12 day dosepak. Cancel appointment to have potassium checked tomorrow. He is in a lot of pain. We can put that off for couple of weeks.

## 2014-05-28 NOTE — Telephone Encounter (Signed)
Referral to Dr Vertell Limber faxed.

## 2014-05-29 ENCOUNTER — Telehealth: Payer: Self-pay | Admitting: Internal Medicine

## 2014-05-29 ENCOUNTER — Other Ambulatory Visit: Payer: Self-pay | Admitting: Internal Medicine

## 2014-05-29 ENCOUNTER — Telehealth: Payer: Self-pay

## 2014-05-29 ENCOUNTER — Other Ambulatory Visit: Payer: Self-pay

## 2014-05-29 MED ORDER — PREDNISONE 10 MG PO KIT: PACK | ORAL | Status: DC

## 2014-05-29 MED ORDER — OXYCODONE-ACETAMINOPHEN 5-325 MG PO TABS: 1.0000 | ORAL_TABLET | Freq: Four times a day (QID) | ORAL | Status: DC

## 2014-05-29 NOTE — Telephone Encounter (Signed)
Per Dr Renold Genta percocet 5/325 qid #40 printed.  Patient aware and his wife will pick up.

## 2014-05-29 NOTE — Telephone Encounter (Signed)
Patients wife called again requesting stronger pain medication for patient.

## 2014-05-29 NOTE — Telephone Encounter (Signed)
Patient wife request a call back on his behalf.  Offered appointment but wanted to talk to Dr Renold Genta first.

## 2014-05-29 NOTE — Telephone Encounter (Signed)
Spoke with spouse. She reports pt has developed a cyst on spine and is causing pt a lot of pain.  She does not think she could get him in for appt but does not want me to cancel the appt yet. She reports pt is having problems with his O2 and would like to discuss other issues with Dr. Annamaria Boots as well. She wants to know if Dr. Annamaria Boots can call her instead of pt having to come in? Please advise thanks

## 2014-05-29 NOTE — Telephone Encounter (Signed)
No appt scheduled at this time with CY Will call pt/spouse next week to schedule sooner appt

## 2014-05-29 NOTE — Telephone Encounter (Signed)
Has appt next week with Dr. Vertell Limber.

## 2014-05-29 NOTE — Telephone Encounter (Signed)
I returned call and spoke to wife as requested. Patient not available for phone now. She reports he is to have surgery by Dr Vertell Limber for a painful cyst on spine, which makes him too uncomfortable to wear his oxygen. I asked she reschedule ROV to see me when he is more mobile, and to call with any specific questions or needs.

## 2014-05-29 NOTE — Telephone Encounter (Signed)
Pt wife returned call [phone]

## 2014-05-29 NOTE — Telephone Encounter (Signed)
LMOMTCB x 1 

## 2014-06-01 NOTE — Telephone Encounter (Signed)
There is no medication that can keep him from catching her cold. The best she can do is wash hands often and even consider wearing a mask if she is close to him. We can send a Zpak to take if he does get sick. He is now on prednisone, which we don't want to continue longer than necessary, because of its side effects.

## 2014-06-01 NOTE — Telephone Encounter (Signed)
Pt has a pending appt with Dr. Annamaria Boots on 07/30/14.  Wife would like to keep this appt and will call back if needed prior to this appt.  She would like CY to know, pt is on: Oxycodone acet 5/325 q6h  Prednisone 6 x 2, 5 x 2, 4 x 2, 3 x 2, 2 x 2, 1 x 2, then stop given By Dr. Renold Genta.  Today is 1st day on 5 tabs qd.     Wife reports she currently has a cold - runny nose, sneezing, and dry cough.  States pt is "guaranteed" to get these symptoms.  Requesting to get a head of this and "the regimen" that Dr. Annamaria Boots usually gives pt called in -- she is unsure what this is.  Per previous phone msgs, pt has been given abx and prednisone.  Dr. Annamaria Boots, pls advise.  Thank you.  [facility]  Allergies  Allergen Reactions  . Other     ANTIHISTAMINES---Enlarged Prostate     Current Outpatient Prescriptions on File Prior to Visit  Medication Sig Dispense Refill  . ADVAIR DISKUS 250-50 MCG/DOSE AEPB INHALE 1 PUFF BY MOUTH TWICE DAILY THEN RINSE MOUTH  1 each  0  . albuterol (PROAIR HFA) 108 (90 BASE) MCG/ACT inhaler Inhale 1-2 puffs into the lungs every 4 (four) hours as needed. as needed shortness of breath prior to exercise  1 Inhaler  11  . amLODipine (NORVASC) 10 MG tablet Take 1 tablet (10 mg total) by mouth daily.  90 tablet  3  . aspirin 325 MG tablet Take 2 tablets by mouth prior to working out      . atorvastatin (LIPITOR) 20 MG tablet Take 1 tablet (20 mg total) by mouth daily at 6 PM.  90 tablet  1  . CALCIPOTRIENE EX Apply topically.      . cholecalciferol (VITAMIN D) 1000 UNITS tablet Take 1,000 Units by mouth daily.      . cyanocobalamin 1000 MCG tablet Take 5,000 mcg by mouth daily.       . finasteride (PROSCAR) 5 MG tablet Take 1 tablet (5 mg total) by mouth daily.  90 tablet  3  . halobetasol (ULTRAVATE) 0.05 % cream       . hydrochlorothiazide (HYDRODIURIL) 25 MG tablet TAKE 1 TABLET BY MOUTH EVERY MORNING  90 tablet  3  . HYDROcodone-acetaminophen (NORCO/VICODIN) 5-325 MG per  tablet Take 1 tablet by mouth 3 (three) times daily.  90 tablet  0  . mometasone (ELOCON) 0.1 % lotion Apply topically 2 (two) times daily as needed.  60 mL  6  . oxyCODONE-acetaminophen (ROXICET) 5-325 MG per tablet Take 1 tablet by mouth 4 (four) times daily.  40 tablet  0  . potassium chloride (K-DUR) 10 MEQ tablet Take 1 tablet (10 mEq total) by mouth 2 (two) times daily.  60 tablet  5  . potassium chloride (K-DUR,KLOR-CON) 10 MEQ tablet       . PredniSONE 10 MG KIT Take in tapering course as directed 6-6-5-5-4-4-3-3-2-2-1-1  42 each  0  . Tamsulosin HCl (FLOMAX) 0.4 MG CAPS Take 0.4 mg by mouth daily.       Marland Kitchen therapeutic multivitamin-minerals (THERAGRAN-M) tablet Take 1 tablet by mouth daily.         No current facility-administered medications on file prior to visit.

## 2014-06-01 NOTE — Telephone Encounter (Signed)
Spoke with pt, she is aware of CY's recs.  Nothing further needed at this time.

## 2014-06-02 ENCOUNTER — Ambulatory Visit: Payer: Medicare Other | Admitting: Internal Medicine

## 2014-06-03 ENCOUNTER — Other Ambulatory Visit: Payer: Self-pay | Admitting: Neurosurgery

## 2014-06-04 ENCOUNTER — Other Ambulatory Visit: Payer: Self-pay | Admitting: Internal Medicine

## 2014-06-05 ENCOUNTER — Ambulatory Visit (INDEPENDENT_AMBULATORY_CARE_PROVIDER_SITE_OTHER): Payer: 59 | Admitting: Internal Medicine

## 2014-06-05 ENCOUNTER — Encounter: Payer: Self-pay | Admitting: Internal Medicine

## 2014-06-05 VITALS — BP 142/68 | HR 69 | Temp 97.5°F | Wt 161.0 lb

## 2014-06-05 DIAGNOSIS — Z8709 Personal history of other diseases of the respiratory system: Secondary | ICD-10-CM

## 2014-06-05 DIAGNOSIS — J069 Acute upper respiratory infection, unspecified: Secondary | ICD-10-CM

## 2014-06-05 DIAGNOSIS — M7130 Other bursal cyst, unspecified site: Secondary | ICD-10-CM

## 2014-06-05 MED ORDER — AMOXICILLIN-POT CLAVULANATE 875-125 MG PO TABS: 1.0000 | ORAL_TABLET | Freq: Two times a day (BID) | ORAL | Status: DC

## 2014-06-05 NOTE — Progress Notes (Signed)
   Subjective:    Patient ID: Jeffrey Meadows, male    DOB: 09/16/1932, 80 y.o.   MRN: [id number]  HPI Saw Dr. Vertell Limber re: synovial cyst. Surgery planned for November 17th. Has come down with URI. Wife has had similar illness. Pt has hx of COPD. Is already on Prednisone for back pain and Oxycodone. Has sore throat. Not much cough at present.    Review of Systems     Objective:   Physical Exam  Pharynx red without exudate . TMs chronically scarred. Neck supple. Chest: clear without rales or wheezing.      Assessment & Plan:   Acute URI COPD Synovial cyst  Plan: Augmentin 875 mg bid x 10 days. Finish prednisone taper previously prescribed. Oxycodone for pain and will help suppress cough. Mucinex OTC

## 2014-06-05 NOTE — Patient Instructions (Addendum)
Take Augmentin as directed. Mucinex OTC. Finish course of Prednisone. Oxycodone for back pain.

## 2014-06-12 ENCOUNTER — Other Ambulatory Visit (INDEPENDENT_AMBULATORY_CARE_PROVIDER_SITE_OTHER): Payer: 59 | Admitting: Internal Medicine

## 2014-06-12 DIAGNOSIS — M545 Low back pain: Secondary | ICD-10-CM

## 2014-06-12 DIAGNOSIS — M1380 Other specified arthritis, unspecified site: Secondary | ICD-10-CM

## 2014-06-12 NOTE — Progress Notes (Signed)
   Subjective:    Patient ID: Jeffrey Meadows, male    DOB: [birth year], 80 y.o.   MRN: [id number]  HPI Previous appt for potassium check. Was checked on Sept 29th and found to be OK. Therefore he did not come today    Review of Systems     Objective:   Physical Exam        Assessment & Plan:  Hypokalemia  Plan recheck in 6 months

## 2014-06-15 ENCOUNTER — Other Ambulatory Visit: Payer: 59 | Admitting: Internal Medicine

## 2014-06-15 DIAGNOSIS — E876 Hypokalemia: Secondary | ICD-10-CM

## 2014-06-15 LAB — POTASSIUM: Potassium: 3.3 mEq/L — ABNORMAL LOW (ref 3.5–5.3)

## 2014-06-16 ENCOUNTER — Telehealth: Payer: Self-pay

## 2014-06-16 MED ORDER — POTASSIUM CHLORIDE ER 10 MEQ PO TBCR: 10.0000 meq | EXTENDED_RELEASE_TABLET | Freq: Four times a day (QID) | ORAL | Status: DC

## 2014-06-16 NOTE — Telephone Encounter (Signed)
-----   Message from [name], MD sent at 06/16/2014  9:52 AM EST ----- Please call patient. K is 3.3 and needs to be a bit higher especially before surgery. Can he take these tabs OK? Would suggest 40 meq daily ie    2-  Micro K 10 meq tabs  twice a day. Recheck in one week.

## 2014-06-16 NOTE — Telephone Encounter (Signed)
Spoke with patients wife regarding potassium results.  Advised her to have patient take 69meq.  New rx sent to pharmacy.  Appt scheduled.

## 2014-06-22 ENCOUNTER — Other Ambulatory Visit: Payer: 59 | Admitting: Internal Medicine

## 2014-06-22 DIAGNOSIS — E876 Hypokalemia: Secondary | ICD-10-CM

## 2014-06-22 LAB — POTASSIUM: Potassium: 3.6 mEq/L (ref 3.5–5.3)

## 2014-06-23 ENCOUNTER — Telehealth: Payer: Self-pay

## 2014-06-23 NOTE — Telephone Encounter (Signed)
-----   Message from [name], MD sent at 06/23/2014 12:46 PM EST ----- Potassium is now normal. Please make sure pt is taking 40 meq daily ie   2-  MicroK 10 meq capsules  twice a day for a total of 4 capsules daily.

## 2014-06-23 NOTE — Telephone Encounter (Signed)
Patient wife informed of potassium results and directions.

## 2014-06-24 ENCOUNTER — Ambulatory Visit (HOSPITAL_COMMUNITY)
Admission: RE | Admit: 2014-06-24 | Discharge: 2014-06-24 | Disposition: A | Discharge status: Home / Self Care | Payer: Medicare Other | Source: Ambulatory Visit | Attending: Anesthesiology | Admitting: Anesthesiology

## 2014-06-24 ENCOUNTER — Encounter (HOSPITAL_COMMUNITY): Payer: Self-pay

## 2014-06-24 ENCOUNTER — Encounter (HOSPITAL_COMMUNITY)
Admission: RE | Admit: 2014-06-24 | Discharge: 2014-06-24 | Disposition: A | Discharge status: Home / Self Care | Payer: Medicare Other | Source: Ambulatory Visit | Attending: Neurosurgery | Admitting: Neurosurgery

## 2014-06-24 DIAGNOSIS — J449 Chronic obstructive pulmonary disease, unspecified: Secondary | ICD-10-CM | POA: Diagnosis not present

## 2014-06-24 DIAGNOSIS — I10 Essential (primary) hypertension: Secondary | ICD-10-CM | POA: Insufficient documentation

## 2014-06-24 HISTORY — DX: Pain in unspecified joint: M25.50

## 2014-06-24 HISTORY — DX: Frequency of micturition: R35.0

## 2014-06-24 HISTORY — DX: Other chronic pain: G89.29

## 2014-06-24 HISTORY — DX: Dorsalgia, unspecified: M54.9

## 2014-06-24 HISTORY — DX: Personal history of other medical treatment: Z92.89

## 2014-06-24 HISTORY — DX: Benign prostatic hyperplasia without lower urinary tract symptoms: N40.0

## 2014-06-24 HISTORY — DX: Unspecified cataract: H26.9

## 2014-06-24 HISTORY — DX: Unspecified osteoarthritis, unspecified site: M19.90

## 2014-06-24 LAB — BASIC METABOLIC PANEL
Anion gap: 12 (ref 5–15)
BUN: 7 mg/dL (ref 6–23)
CO2: 28 mEq/L (ref 19–32)
Calcium: 10 mg/dL (ref 8.4–10.5)
Chloride: 99 mEq/L (ref 96–112)
Creatinine, Ser: 0.7 mg/dL (ref 0.50–1.35)
GFR calc Af Amer: >90 mL/min (ref >90)
GFR calc non Af Amer: 87 mL/min — ABNORMAL LOW (ref >90)
Glucose, Bld: 108 mg/dL — ABNORMAL HIGH (ref 70–99)
Potassium: 3.9 mEq/L (ref 3.7–5.3)
Sodium: 139 mEq/L (ref 137–147)

## 2014-06-24 LAB — CBC
HCT: 42.4 % (ref 39.0–52.0)
Hemoglobin: 14.8 g/dL (ref 13.0–17.0)
MCH: 32.2 pg (ref 26.0–34.0)
MCHC: 34.9 g/dL (ref 30.0–36.0)
MCV: 92.4 fL (ref 78.0–100.0)
Platelets: 201 10*3/uL (ref 150–400)
RBC: 4.59 MIL/uL (ref 4.22–5.81)
RDW: 12.4 % (ref 11.5–15.5)
WBC: 8.3 10*3/uL (ref 4.0–10.5)

## 2014-06-24 LAB — SURGICAL PCR SCREEN
MRSA, PCR: NEGATIVE
Staphylococcus aureus: NEGATIVE

## 2014-06-24 NOTE — Progress Notes (Signed)
Pt doesn't have a cardiologist  Denies ever having a stress test/heart cath or echo  Denies EKG or CXR in past yr  Medical Md is Dr.Mary Runner, broadcasting/film/video

## 2014-06-24 NOTE — Progress Notes (Signed)
Pulmonologist is Dr.Clint Young saw him last 6 months ago

## 2014-06-24 NOTE — Progress Notes (Signed)
   06/24/14 0916  OBSTRUCTIVE SLEEP APNEA  Have you ever been diagnosed with sleep apnea through a sleep study? No  Do you snore loudly (loud enough to be heard through closed doors)?  0  Do you often feel tired, fatigued, or sleepy during the daytime? 0  Has anyone observed you stop breathing during your sleep? 1  Do you have, or are you being treated for high blood pressure? 1  BMI more than 35 kg/m2? 0  Age over 78 years old? 1  Neck circumference greater than 40 cm/16 inches? 0  Gender: 1  Obstructive Sleep Apnea Score 4  Score 4 or greater  Results sent to PCP

## 2014-06-24 NOTE — Pre-Procedure Instructions (Signed)
Jeffrey Meadows  06/24/2014   Your procedure is scheduled on:  Thurs, Nov 19 @ 1:10 PM  Report to [facility]  at 10:00 AM.  Call this number if you have problems the morning of surgery: [phone]   Remember:   Do not eat food or drink liquids after midnight.   Take these medicines the morning of surgery with A SIP OF WATER: Advair<Bring Your Inhaler With You>,Albuterol(if needed),Amlodipine(Norvasc),Amoxicillin,Proscar(Finasteride),Pain Pill(if needed),and Flomax(Tamsulosin)              Stop taking any Herbal Meds or Vitamins. No Goody's,BC's,Aleve,Ibuprofen,Aspirin,Fish Oil.   Do not wear jewelry  Do not wear lotions, powders, or colognes. You may wear deodorant.  Men may shave face and neck.  Do not bring valuables to the hospital.  [facility] is not responsible                  for any belongings or valuables.               Contacts, dentures or bridgework may not be worn into surgery.  Leave suitcase in the car. After surgery it may be brought to your room.  For patients admitted to the hospital, discharge time is determined by your                treatment team.               Patients discharged the day of surgery will not be allowed to drive  home.    Special Instructions:  [facility] - Preparing for Surgery  Before surgery, you can play an important role.  Because skin is not sterile, your skin needs to be as free of germs as possible.  You can reduce the number of germs on you skin by washing with CHG (chlorahexidine gluconate) soap before surgery.  CHG is an antiseptic cleaner which kills germs and bonds with the skin to continue killing germs even after washing.  Please DO NOT use if you have an allergy to CHG or antibacterial soaps.  If your skin becomes reddened/irritated stop using the CHG and inform your nurse when you arrive at Short Stay.  Do not shave (including legs and underarms) for at least 48 hours prior to the first CHG shower.  You may shave  your face.  Please follow these instructions carefully:   1.  Shower with CHG Soap the night before surgery and the                                morning of Surgery.  2.  If you choose to wash your hair, wash your hair first as usual with your       normal shampoo.  3.  After you shampoo, rinse your hair and body thoroughly to remove the                      Shampoo.  4.  Use CHG as you would any other liquid soap.  You can apply chg directly       to the skin and wash gently with scrungie or a clean washcloth.  5.  Apply the CHG Soap to your body ONLY FROM THE NECK DOWN.        Do not use on open wounds or open sores.  Avoid contact with your eyes,       ears, mouth and  genitals (private parts).  Wash genitals (private parts)       with your normal soap.  6.  Wash thoroughly, paying special attention to the area where your surgery        will be performed.  7.  Thoroughly rinse your body with warm water from the neck down.  8.  DO NOT shower/wash with your normal soap after using and rinsing off       the CHG Soap.  9.  Pat yourself dry with a clean towel.            10.  Wear clean pajamas.            11.  Place clean sheets on your bed the night of your first shower and do not        sleep with pets.  Day of Surgery  Do not apply any lotions/deoderants the morning of surgery.  Please wear clean clothes to the hospital/surgery center.     Please read over the following fact sheets that you were given: Pain Booklet, Coughing and Deep Breathing, MRSA Information and Surgical Site Infection Prevention

## 2014-06-30 ENCOUNTER — Other Ambulatory Visit: Payer: Self-pay

## 2014-06-30 MED ORDER — TAMSULOSIN HCL 0.4 MG PO CAPS: 0.4000 mg | ORAL_CAPSULE | Freq: Every day | ORAL | Status: DC

## 2014-07-01 MED ORDER — CEFAZOLIN SODIUM-DEXTROSE 2-3 GM-% IV SOLR
2.0000 g | INTRAVENOUS | Status: AC
  Administered 2014-07-02: 2 g via INTRAVENOUS
  Filled 2014-07-01: qty 50

## 2014-07-02 ENCOUNTER — Inpatient Hospital Stay (HOSPITAL_COMMUNITY): Payer: Medicare Other | Admitting: Anesthesiology

## 2014-07-02 ENCOUNTER — Encounter (HOSPITAL_COMMUNITY): Payer: Self-pay | Admitting: *Deleted

## 2014-07-02 ENCOUNTER — Inpatient Hospital Stay (HOSPITAL_COMMUNITY): Payer: Medicare Other

## 2014-07-02 ENCOUNTER — Encounter (HOSPITAL_COMMUNITY)
Admission: RE | Disposition: A | Discharge status: Home / Self Care | Payer: Self-pay | Source: Ambulatory Visit | Attending: Neurosurgery

## 2014-07-02 ENCOUNTER — Inpatient Hospital Stay (HOSPITAL_COMMUNITY)
Admission: RE | Admit: 2014-07-02 | Discharge: 2014-07-03 | DRG: 517 | Disposition: A | Discharge status: Home / Self Care | Payer: Medicare Other | Source: Ambulatory Visit | Attending: Neurosurgery | Admitting: Neurosurgery

## 2014-07-02 DIAGNOSIS — J439 Emphysema, unspecified: Secondary | ICD-10-CM | POA: Diagnosis present

## 2014-07-02 DIAGNOSIS — I10 Essential (primary) hypertension: Secondary | ICD-10-CM | POA: Diagnosis present

## 2014-07-02 DIAGNOSIS — Z419 Encounter for procedure for purposes other than remedying health state, unspecified: Secondary | ICD-10-CM

## 2014-07-02 DIAGNOSIS — Z79899 Other long term (current) drug therapy: Secondary | ICD-10-CM

## 2014-07-02 DIAGNOSIS — M533 Sacrococcygeal disorders, not elsewhere classified: Secondary | ICD-10-CM | POA: Diagnosis present

## 2014-07-02 DIAGNOSIS — M7138 Other bursal cyst, other site: Secondary | ICD-10-CM | POA: Diagnosis present

## 2014-07-02 DIAGNOSIS — M4316 Spondylolisthesis, lumbar region: Secondary | ICD-10-CM | POA: Diagnosis present

## 2014-07-02 DIAGNOSIS — Z9981 Dependence on supplemental oxygen: Secondary | ICD-10-CM | POA: Diagnosis not present

## 2014-07-02 DIAGNOSIS — M4120 Other idiopathic scoliosis, site unspecified: Secondary | ICD-10-CM | POA: Diagnosis present

## 2014-07-02 DIAGNOSIS — M4806 Spinal stenosis, lumbar region: Secondary | ICD-10-CM | POA: Diagnosis present

## 2014-07-02 DIAGNOSIS — Z87891 Personal history of nicotine dependence: Secondary | ICD-10-CM | POA: Diagnosis not present

## 2014-07-02 DIAGNOSIS — M5116 Intervertebral disc disorders with radiculopathy, lumbar region: Secondary | ICD-10-CM | POA: Diagnosis present

## 2014-07-02 HISTORY — PX: LAMINECTOMY: SHX219

## 2014-07-02 SURGERY — LUMBAR LAMINECTOMY FOR TUMOR
Anesthesia: General | Laterality: Right

## 2014-07-02 MED ORDER — PHENOL 1.4 % MT LIQD: 1.0000 | OROMUCOSAL | Status: DC | PRN

## 2014-07-02 MED ORDER — KCL IN DEXTROSE-NACL 20-5-0.45 MEQ/L-%-% IV SOLN
INTRAVENOUS | Status: DC
  Filled 2014-07-02 (×4): qty 1000

## 2014-07-02 MED ORDER — ONDANSETRON HCL 4 MG/2ML IJ SOLN
INTRAMUSCULAR | Status: DC | PRN
  Administered 2014-07-02: 4 mg via INTRAVENOUS

## 2014-07-02 MED ORDER — ZOLPIDEM TARTRATE 5 MG PO TABS: 5.0000 mg | ORAL_TABLET | Freq: Every evening | ORAL | Status: DC | PRN

## 2014-07-02 MED ORDER — ARTIFICIAL TEARS OP OINT
TOPICAL_OINTMENT | OPHTHALMIC | Status: DC | PRN
  Administered 2014-07-02: 1 via OPHTHALMIC

## 2014-07-02 MED ORDER — SENNA 8.6 MG PO TABS
1.0000 | ORAL_TABLET | Freq: Two times a day (BID) | ORAL | Status: DC
  Filled 2014-07-02 (×3): qty 1

## 2014-07-02 MED ORDER — FENTANYL CITRATE 0.05 MG/ML IJ SOLN
INTRAMUSCULAR | Status: AC
  Filled 2014-07-02: qty 5

## 2014-07-02 MED ORDER — DOCUSATE SODIUM 100 MG PO CAPS
100.0000 mg | ORAL_CAPSULE | Freq: Two times a day (BID) | ORAL | Status: DC
  Filled 2014-07-02 (×3): qty 1

## 2014-07-02 MED ORDER — OXYCODONE HCL 5 MG PO TABS
5.0000 mg | ORAL_TABLET | ORAL | Status: DC | PRN
  Administered 2014-07-02: 5 mg via ORAL
  Filled 2014-07-02: qty 1

## 2014-07-02 MED ORDER — ARTIFICIAL TEARS OP OINT
TOPICAL_OINTMENT | OPHTHALMIC | Status: AC
  Filled 2014-07-02: qty 3.5

## 2014-07-02 MED ORDER — POTASSIUM CHLORIDE CRYS ER 20 MEQ PO TBCR
20.0000 meq | EXTENDED_RELEASE_TABLET | Freq: Two times a day (BID) | ORAL | Status: DC
  Administered 2014-07-02: 20 meq via ORAL
  Filled 2014-07-02 (×3): qty 1

## 2014-07-02 MED ORDER — DIAZEPAM 5 MG PO TABS: 5.0000 mg | ORAL_TABLET | Freq: Four times a day (QID) | ORAL | Status: DC | PRN

## 2014-07-02 MED ORDER — MENTHOL 3 MG MT LOZG: 1.0000 | LOZENGE | OROMUCOSAL | Status: DC | PRN

## 2014-07-02 MED ORDER — FENTANYL CITRATE 0.05 MG/ML IJ SOLN
INTRAMUSCULAR | Status: DC | PRN
  Administered 2014-07-02 (×3): 50 ug via INTRAVENOUS
  Administered 2014-07-02: 100 ug via INTRAVENOUS

## 2014-07-02 MED ORDER — HYDROCODONE-ACETAMINOPHEN 5-325 MG PO TABS: 1.0000 | ORAL_TABLET | ORAL | Status: DC | PRN

## 2014-07-02 MED ORDER — OXYCODONE-ACETAMINOPHEN 5-325 MG PO TABS
1.0000 | ORAL_TABLET | ORAL | Status: DC | PRN
  Administered 2014-07-02: 1 via ORAL
  Filled 2014-07-02: qty 1

## 2014-07-02 MED ORDER — SUCCINYLCHOLINE CHLORIDE 20 MG/ML IJ SOLN
INTRAMUSCULAR | Status: AC
  Filled 2014-07-02: qty 1

## 2014-07-02 MED ORDER — THROMBIN 20000 UNITS EX SOLR
CUTANEOUS | Status: DC | PRN
  Administered 2014-07-02: 13:00:00 via TOPICAL

## 2014-07-02 MED ORDER — FINASTERIDE 5 MG PO TABS
5.0000 mg | ORAL_TABLET | Freq: Every day | ORAL | Status: DC
  Filled 2014-07-02: qty 1

## 2014-07-02 MED ORDER — VITAMIN B-12 1000 MCG PO TABS
5000.0000 ug | ORAL_TABLET | Freq: Every day | ORAL | Status: DC
  Filled 2014-07-02: qty 5

## 2014-07-02 MED ORDER — ATORVASTATIN CALCIUM 20 MG PO TABS
20.0000 mg | ORAL_TABLET | Freq: Every day | ORAL | Status: DC
  Filled 2014-07-02 (×2): qty 1

## 2014-07-02 MED ORDER — SODIUM CHLORIDE 0.9 % IJ SOLN: 3.0000 mL | INTRAMUSCULAR | Status: DC | PRN

## 2014-07-02 MED ORDER — VITAMIN D3 25 MCG (1000 UNIT) PO TABS
1000.0000 [IU] | ORAL_TABLET | Freq: Every day | ORAL | Status: DC
  Filled 2014-07-02: qty 1

## 2014-07-02 MED ORDER — 0.9 % SODIUM CHLORIDE (POUR BTL) OPTIME
TOPICAL | Status: DC | PRN
  Administered 2014-07-02: 1000 mL

## 2014-07-02 MED ORDER — FLEET ENEMA 7-19 GM/118ML RE ENEM
1.0000 | ENEMA | Freq: Once | RECTAL | Status: AC | PRN
  Filled 2014-07-02: qty 1

## 2014-07-02 MED ORDER — GLYCOPYRROLATE 0.2 MG/ML IJ SOLN
INTRAMUSCULAR | Status: DC | PRN
  Administered 2014-07-02: .8 mg via INTRAVENOUS

## 2014-07-02 MED ORDER — SODIUM CHLORIDE 0.9 % IJ SOLN
3.0000 mL | Freq: Two times a day (BID) | INTRAMUSCULAR | Status: DC
  Administered 2014-07-02: 3 mL via INTRAVENOUS

## 2014-07-02 MED ORDER — ONDANSETRON HCL 4 MG/2ML IJ SOLN
4.0000 mg | INTRAMUSCULAR | Status: DC | PRN
Start: 2014-07-02 — End: 2014-07-03

## 2014-07-02 MED ORDER — TAMSULOSIN HCL 0.4 MG PO CAPS
0.4000 mg | ORAL_CAPSULE | Freq: Every day | ORAL | Status: DC
  Filled 2014-07-02 (×2): qty 1

## 2014-07-02 MED ORDER — LIDOCAINE HCL (CARDIAC) 20 MG/ML IV SOLN
INTRAVENOUS | Status: DC | PRN
  Administered 2014-07-02: 80 mg via INTRAVENOUS

## 2014-07-02 MED ORDER — MORPHINE SULFATE 2 MG/ML IJ SOLN: 1.0000 mg | INTRAMUSCULAR | Status: DC | PRN

## 2014-07-02 MED ORDER — POTASSIUM CHLORIDE ER 10 MEQ PO TBCR
10.0000 meq | EXTENDED_RELEASE_TABLET | Freq: Four times a day (QID) | ORAL | Status: DC
  Filled 2014-07-02 (×2): qty 1

## 2014-07-02 MED ORDER — ALUM & MAG HYDROXIDE-SIMETH 200-200-20 MG/5ML PO SUSP: 30.0000 mL | Freq: Four times a day (QID) | ORAL | Status: DC | PRN

## 2014-07-02 MED ORDER — ACETAMINOPHEN 325 MG PO TABS: 650.0000 mg | ORAL_TABLET | ORAL | Status: DC | PRN

## 2014-07-02 MED ORDER — PHENYLEPHRINE HCL 10 MG/ML IJ SOLN
INTRAMUSCULAR | Status: AC
  Filled 2014-07-02: qty 1

## 2014-07-02 MED ORDER — LIDOCAINE-EPINEPHRINE 1 %-1:100000 IJ SOLN
INTRAMUSCULAR | Status: DC | PRN
  Administered 2014-07-02: 8 mL

## 2014-07-02 MED ORDER — HYDROCHLOROTHIAZIDE 25 MG PO TABS
25.0000 mg | ORAL_TABLET | Freq: Every morning | ORAL | Status: DC
  Filled 2014-07-02: qty 1

## 2014-07-02 MED ORDER — OXYCODONE-ACETAMINOPHEN 5-325 MG PO TABS: 1.0000 | ORAL_TABLET | ORAL | Status: DC | PRN

## 2014-07-02 MED ORDER — AMOXICILLIN-POT CLAVULANATE 875-125 MG PO TABS
1.0000 | ORAL_TABLET | Freq: Two times a day (BID) | ORAL | Status: DC
Start: 2014-07-02 — End: 2014-07-02

## 2014-07-02 MED ORDER — MOMETASONE FURO-FORMOTEROL FUM 100-5 MCG/ACT IN AERO
2.0000 | INHALATION_SPRAY | Freq: Two times a day (BID) | RESPIRATORY_TRACT | Status: DC
  Administered 2014-07-02: 2 via RESPIRATORY_TRACT
  Filled 2014-07-02: qty 8.8

## 2014-07-02 MED ORDER — ACETAMINOPHEN 650 MG RE SUPP: 650.0000 mg | RECTAL | Status: DC | PRN

## 2014-07-02 MED ORDER — FENTANYL CITRATE 0.05 MG/ML IJ SOLN: 25.0000 ug | INTRAMUSCULAR | Status: DC | PRN

## 2014-07-02 MED ORDER — PROPOFOL 10 MG/ML IV BOLUS
INTRAVENOUS | Status: AC
  Filled 2014-07-02: qty 40

## 2014-07-02 MED ORDER — NEOSTIGMINE METHYLSULFATE 10 MG/10ML IV SOLN
INTRAVENOUS | Status: DC | PRN
  Administered 2014-07-02: 5 mg via INTRAVENOUS

## 2014-07-02 MED ORDER — LACTATED RINGERS IV SOLN
INTRAVENOUS | Status: DC
  Administered 2014-07-02: 11:00:00 via INTRAVENOUS

## 2014-07-02 MED ORDER — THERAPEUTIC MULTIVIT/MINERAL PO TABS: 1.0000 | ORAL_TABLET | Freq: Every day | ORAL | Status: DC

## 2014-07-02 MED ORDER — LACTATED RINGERS IV SOLN
INTRAVENOUS | Status: DC | PRN
  Administered 2014-07-02 (×2): via INTRAVENOUS

## 2014-07-02 MED ORDER — ROCURONIUM BROMIDE 100 MG/10ML IV SOLN
INTRAVENOUS | Status: DC | PRN
  Administered 2014-07-02: 50 mg via INTRAVENOUS

## 2014-07-02 MED ORDER — DEXAMETHASONE SODIUM PHOSPHATE 4 MG/ML IJ SOLN
INTRAMUSCULAR | Status: DC | PRN
  Administered 2014-07-02: 8 mg via INTRAVENOUS

## 2014-07-02 MED ORDER — DEXAMETHASONE SODIUM PHOSPHATE 4 MG/ML IJ SOLN
INTRAMUSCULAR | Status: AC
  Filled 2014-07-02: qty 2

## 2014-07-02 MED ORDER — NEOSTIGMINE METHYLSULFATE 10 MG/10ML IV SOLN
INTRAVENOUS | Status: AC
  Filled 2014-07-02: qty 1

## 2014-07-02 MED ORDER — ADULT MULTIVITAMIN W/MINERALS CH
1.0000 | ORAL_TABLET | Freq: Every day | ORAL | Status: DC
  Filled 2014-07-02: qty 1

## 2014-07-02 MED ORDER — LIDOCAINE HCL (CARDIAC) 20 MG/ML IV SOLN
INTRAVENOUS | Status: AC
  Filled 2014-07-02: qty 5

## 2014-07-02 MED ORDER — ONDANSETRON HCL 4 MG/2ML IJ SOLN
INTRAMUSCULAR | Status: AC
  Filled 2014-07-02: qty 2

## 2014-07-02 MED ORDER — SENNOSIDES-DOCUSATE SODIUM 8.6-50 MG PO TABS
1.0000 | ORAL_TABLET | Freq: Every evening | ORAL | Status: DC | PRN
  Filled 2014-07-02: qty 1

## 2014-07-02 MED ORDER — OXYCODONE HCL 5 MG PO TABS: 5.0000 mg | ORAL_TABLET | Freq: Four times a day (QID) | ORAL | Status: DC

## 2014-07-02 MED ORDER — MOMETASONE FUROATE 0.1 % EX SOLN: Freq: Two times a day (BID) | CUTANEOUS | Status: DC | PRN

## 2014-07-02 MED ORDER — ROCURONIUM BROMIDE 50 MG/5ML IV SOLN
INTRAVENOUS | Status: AC
  Filled 2014-07-02: qty 1

## 2014-07-02 MED ORDER — PANTOPRAZOLE SODIUM 40 MG IV SOLR
40.0000 mg | Freq: Every day | INTRAVENOUS | Status: DC
  Filled 2014-07-02 (×2): qty 40

## 2014-07-02 MED ORDER — CEFAZOLIN SODIUM 1-5 GM-% IV SOLN
1.0000 g | Freq: Three times a day (TID) | INTRAVENOUS | Status: AC
  Administered 2014-07-02 – 2014-07-03 (×2): 1 g via INTRAVENOUS
  Filled 2014-07-02 (×2): qty 50

## 2014-07-02 MED ORDER — METHYLPREDNISOLONE ACETATE 80 MG/ML IJ SUSP
INTRAMUSCULAR | Status: DC | PRN
Start: 2014-07-02 — End: 2014-07-02
  Administered 2014-07-02: 80 mg

## 2014-07-02 MED ORDER — OXYCODONE-ACETAMINOPHEN 5-325 MG PO TABS: 1.0000 | ORAL_TABLET | Freq: Four times a day (QID) | ORAL | Status: DC

## 2014-07-02 MED ORDER — PROPOFOL 10 MG/ML IV BOLUS
INTRAVENOUS | Status: DC | PRN
  Administered 2014-07-02: 50 mg via INTRAVENOUS
  Administered 2014-07-02: 150 mg via INTRAVENOUS

## 2014-07-02 MED ORDER — ALBUTEROL SULFATE (2.5 MG/3ML) 0.083% IN NEBU: 2.5000 mg | INHALATION_SOLUTION | RESPIRATORY_TRACT | Status: DC | PRN

## 2014-07-02 MED ORDER — AMLODIPINE BESYLATE 10 MG PO TABS
10.0000 mg | ORAL_TABLET | Freq: Every day | ORAL | Status: DC
  Filled 2014-07-02: qty 1

## 2014-07-02 MED ORDER — FENTANYL CITRATE 0.05 MG/ML IJ SOLN
INTRAMUSCULAR | Status: AC
  Filled 2014-07-02: qty 2

## 2014-07-02 MED ORDER — TAMSULOSIN HCL 0.4 MG PO CAPS: 0.4000 mg | ORAL_CAPSULE | Freq: Every day | ORAL | Status: DC

## 2014-07-02 MED ORDER — BISACODYL 10 MG RE SUPP: 10.0000 mg | Freq: Every day | RECTAL | Status: DC | PRN

## 2014-07-02 MED ORDER — BUPIVACAINE HCL (PF) 0.5 % IJ SOLN
INTRAMUSCULAR | Status: DC | PRN
  Administered 2014-07-02: 8 mL

## 2014-07-02 MED ORDER — CALCIPOTRIENE 0.005 % EX OINT: TOPICAL_OINTMENT | Freq: Two times a day (BID) | CUTANEOUS | Status: DC

## 2014-07-02 MED ORDER — ALBUTEROL SULFATE HFA 108 (90 BASE) MCG/ACT IN AERS: 1.0000 | INHALATION_SPRAY | RESPIRATORY_TRACT | Status: DC | PRN

## 2014-07-02 SURGICAL SUPPLY — 76 items
APL SKNCLS STERI-STRIP NONHPOA (GAUZE/BANDAGES/DRESSINGS)
BENZOIN TINCTURE PRP APPL 2/3 (GAUZE/BANDAGES/DRESSINGS) IMPLANT
BLADE CLIPPER SURG (BLADE) IMPLANT
BLADE ULTRA TIP 2M (BLADE) IMPLANT
BUR MATCHSTICK NEURO 3.0 LAGG (BURR) ×2 IMPLANT
BUR ROUND FLUTED 5 RND (BURR) ×2 IMPLANT
CANISTER SUCT 3000ML (MISCELLANEOUS) ×2 IMPLANT
CONT SPEC 4OZ CLIKSEAL STRL BL (MISCELLANEOUS) ×2 IMPLANT
DECANTER SPIKE VIAL GLASS SM (MISCELLANEOUS) ×2 IMPLANT
DRAPE LAPAROTOMY 100X72 PEDS (DRAPES) IMPLANT
DRAPE LAPAROTOMY 100X72X124 (DRAPES) ×2 IMPLANT
DRAPE MICROSCOPE LEICA (MISCELLANEOUS) ×2 IMPLANT
DRAPE POUCH INSTRU U-SHP 10X18 (DRAPES) ×2 IMPLANT
DRAPE SURG 17X23 STRL (DRAPES) ×2 IMPLANT
DRSG OPSITE POSTOP 3X4 (GAUZE/BANDAGES/DRESSINGS) ×1 IMPLANT
DRSG TELFA 3X8 NADH (GAUZE/BANDAGES/DRESSINGS) ×2 IMPLANT
DURAPREP 26ML APPLICATOR (WOUND CARE) ×2 IMPLANT
ELECT REM PT RETURN 9FT ADLT (ELECTROSURGICAL) ×2
ELECTRODE REM PT RTRN 9FT ADLT (ELECTROSURGICAL) ×1 IMPLANT
GAUZE SPONGE 4X4 12PLY STRL (GAUZE/BANDAGES/DRESSINGS) ×2 IMPLANT
GAUZE SPONGE 4X4 16PLY XRAY LF (GAUZE/BANDAGES/DRESSINGS) IMPLANT
GLOVE BIO SURGEON STRL SZ8 (GLOVE) ×3 IMPLANT
GLOVE BIOGEL PI IND STRL 7.0 (GLOVE) IMPLANT
GLOVE BIOGEL PI IND STRL 8 (GLOVE) ×1 IMPLANT
GLOVE BIOGEL PI IND STRL 8.5 (GLOVE) ×1 IMPLANT
GLOVE BIOGEL PI INDICATOR 7.0 (GLOVE) ×4
GLOVE BIOGEL PI INDICATOR 8 (GLOVE) ×1
GLOVE BIOGEL PI INDICATOR 8.5 (GLOVE) ×1
GLOVE ECLIPSE 8.0 STRL XLNG CF (GLOVE) ×2 IMPLANT
GLOVE EXAM NITRILE LRG STRL (GLOVE) IMPLANT
GLOVE EXAM NITRILE MD LF STRL (GLOVE) IMPLANT
GLOVE EXAM NITRILE XL STR (GLOVE) IMPLANT
GLOVE EXAM NITRILE XS STR PU (GLOVE) IMPLANT
GLOVE INDICATOR 7.5 STRL GRN (GLOVE) ×1 IMPLANT
GOWN STRL REUS W/ TWL LRG LVL3 (GOWN DISPOSABLE) IMPLANT
GOWN STRL REUS W/ TWL XL LVL3 (GOWN DISPOSABLE) ×1 IMPLANT
GOWN STRL REUS W/TWL 2XL LVL3 (GOWN DISPOSABLE) ×2 IMPLANT
GOWN STRL REUS W/TWL LRG LVL3 (GOWN DISPOSABLE) ×2
GOWN STRL REUS W/TWL XL LVL3 (GOWN DISPOSABLE) ×4
HEMOSTAT SURGICEL 2X14 (HEMOSTASIS) IMPLANT
KIT BASIN OR (CUSTOM PROCEDURE TRAY) ×2 IMPLANT
KIT ROOM TURNOVER OR (KITS) ×2 IMPLANT
LIQUID BAND (GAUZE/BANDAGES/DRESSINGS) ×1 IMPLANT
MARKER SKIN DUAL TIP RULER LAB (MISCELLANEOUS) IMPLANT
NDL HYPO 18GX1.5 BLUNT FILL (NEEDLE) IMPLANT
NDL HYPO 25X1 1.5 SAFETY (NEEDLE) ×1 IMPLANT
NDL SPNL 18GX3.5 QUINCKE PK (NEEDLE) IMPLANT
NDL SPNL 22GX3.5 QUINCKE BK (NEEDLE) IMPLANT
NEEDLE HYPO 18GX1.5 BLUNT FILL (NEEDLE) ×2 IMPLANT
NEEDLE HYPO 25X1 1.5 SAFETY (NEEDLE) ×2 IMPLANT
NEEDLE SPNL 18GX3.5 QUINCKE PK (NEEDLE) ×2 IMPLANT
NEEDLE SPNL 22GX3.5 QUINCKE BK (NEEDLE) ×2 IMPLANT
NS IRRIG 1000ML POUR BTL (IV SOLUTION) ×2 IMPLANT
PACK LAMINECTOMY NEURO (CUSTOM PROCEDURE TRAY) ×2 IMPLANT
PAD DRESSING TELFA 3X8 NADH (GAUZE/BANDAGES/DRESSINGS) ×1 IMPLANT
PATTIES SURGICAL .25X.25 (GAUZE/BANDAGES/DRESSINGS) IMPLANT
PATTIES SURGICAL .5 X.5 (GAUZE/BANDAGES/DRESSINGS) IMPLANT
PATTIES SURGICAL .5 X3 (DISPOSABLE) IMPLANT
RUBBERBAND STERILE (MISCELLANEOUS) ×4 IMPLANT
SPONGE LAP 4X18 X RAY DECT (DISPOSABLE) IMPLANT
SPONGE NEURO XRAY DETECT 1X3 (DISPOSABLE) IMPLANT
SPONGE SURGIFOAM ABS GEL 100 (HEMOSTASIS) ×2 IMPLANT
STAPLER SKIN PROX WIDE 3.9 (STAPLE) IMPLANT
STRIP CLOSURE SKIN 1/2X4 (GAUZE/BANDAGES/DRESSINGS) IMPLANT
SUT NURALON 4 0 TR CR/8 (SUTURE) IMPLANT
SUT SILK 6 0 BV 1XDISCX (SUTURE) IMPLANT
SUT VIC AB 0 CT1 18XCR BRD8 (SUTURE) ×1 IMPLANT
SUT VIC AB 0 CT1 8-18 (SUTURE) ×2
SUT VIC AB 2-0 CT1 18 (SUTURE) ×2 IMPLANT
SUT VIC AB 3-0 SH 8-18 (SUTURE) ×2 IMPLANT
SYR 20ML ECCENTRIC (SYRINGE) ×2 IMPLANT
SYR 5ML LL (SYRINGE) ×1 IMPLANT
TOWEL OR 17X24 6PK STRL BLUE (TOWEL DISPOSABLE) ×2 IMPLANT
TOWEL OR 17X26 10 PK STRL BLUE (TOWEL DISPOSABLE) ×2 IMPLANT
TRAY FOLEY CATH 14FRSI W/METER (CATHETERS) IMPLANT
WATER STERILE IRR 1000ML POUR (IV SOLUTION) ×2 IMPLANT

## 2014-07-02 NOTE — Anesthesia Preprocedure Evaluation (Addendum)
Anesthesia Evaluation  Patient identified by MRN, date of birth, ID band Patient awake    Reviewed: H&P , NPO status , Patient's Chart, lab work & pertinent test results, reviewed documented beta blocker date and time   Airway Mallampati: II  TM Distance: >3 FB Neck ROM: Full    Dental   Pulmonary COPDformer smoker,  breath sounds clear to auscultation        Cardiovascular hypertension, + Peripheral Vascular Disease Rhythm:Regular Rate:Normal     Neuro/Psych    GI/Hepatic negative GI ROS, Neg liver ROS,   Endo/Other  negative endocrine ROS  Renal/GU negative Renal ROS     Musculoskeletal   Abdominal   Peds  Hematology   Anesthesia Other Findings   Reproductive/Obstetrics                            Anesthesia Physical Anesthesia Plan  ASA: III  Anesthesia Plan: General   Post-op Pain Management:    Induction: Intravenous  Airway Management Planned: Oral ETT  Additional Equipment:   Intra-op Plan:   Post-operative Plan: Extubation in OR  Informed Consent: I have reviewed the patients History and Physical, chart, labs and discussed the procedure including the risks, benefits and alternatives for the proposed anesthesia with the patient or authorized representative who has indicated his/her understanding and acceptance.   Dental advisory given  Plan Discussed with: CRNA, Anesthesiologist and Surgeon  Anesthesia Plan Comments:         Anesthesia Quick Evaluation

## 2014-07-02 NOTE — Transfer of Care (Signed)
Immediate Anesthesia Transfer of Care Note  Patient: Jeffrey Meadows  Procedure(s) Performed: Procedure(s): Right Lumbar two-three Laminectomy for Synovial Cyst (Right)  Patient Location: PACU  Anesthesia Type:General  Level of Consciousness: awake, alert , oriented and patient cooperative  Airway & Oxygen Therapy: Patient Spontanous Breathing and Patient connected to face mask oxygen  Post-op Assessment: Report given to PACU RN, Post -op Vital signs reviewed and stable and Patient moving all extremities X 4  Post vital signs: Reviewed and stable  Complications: No apparent anesthesia complications

## 2014-07-02 NOTE — Plan of Care (Signed)
Problem: Consults Goal: Spinal Surgery Patient Education See Patient Education Module for education specifics. Outcome: Completed/Met Date Met:  07/02/14     

## 2014-07-02 NOTE — Plan of Care (Signed)
Problem: Consults Goal: Diagnosis - Spinal Surgery Outcome: Completed/Met Date Met:  07/02/14 RIGHT LAMINECTOMY FOR SYNOVIAL CYST     

## 2014-07-02 NOTE — Anesthesia Postprocedure Evaluation (Signed)
  Anesthesia Post-op Note  Patient: Jeffrey Meadows  Procedure(s) Performed: Procedure(s): Right Lumbar two-three Laminectomy for Synovial Cyst (Right)  Patient Location: PACU  Anesthesia Type:General  Level of Consciousness: awake  Airway and Oxygen Therapy: Patient Spontanous Breathing  Post-op Pain: mild  Post-op Assessment: Post-op Vital signs reviewed  Post-op Vital Signs: Reviewed  Last Vitals:  Filed Vitals:   07/02/14 1515  BP:   Pulse: 78  Temp: 36.8 C  Resp: 15    Complications: No apparent anesthesia complications

## 2014-07-02 NOTE — Plan of Care (Signed)
Problem: Phase II Progression Outcomes Goal: Progress activity as tolerated unless otherwise ordered Outcome: Progressing Goal: Discharge plan established Outcome: Progressing Goal: Tolerating diet Outcome: Progressing Goal: PT/OT consults completed Outcome: Progressing

## 2014-07-02 NOTE — Progress Notes (Signed)
Awake, alert, conversant.  Full strength both lower extremities with no numbness.  Doing well.

## 2014-07-02 NOTE — H&P (Signed)
[place], [place] [address] Phone: [phone]   Patient ID:   [id number] Patient: Jeffrey Meadows  Date of Birth: Apr 12, 1933 Visit Type: Office Visit   Date: 06/03/2014 11:30 AM Provider: Marchia Meiers. Vertell Limber MD   This 80 year old male presents for back pain and Leg pain.  History of Present Illness: 1.  back pain  2.  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  Hx: emphysema, HTN SxHx: left leg vascular surgery ~60yrs ago; RtTHR at age 80  Oxycodone 5/325 QID  MRI on [facility]  Patient currently complains of pain into his sacrum and both legs.  He says it is much worse in his right leg than his left and it goes into his thighs and hips and generally stops above his knees.  Occasional back strains in the past but generally has not had a lot of back pain.  He feels that he is weak on his right leg.  He describes 85% of the problem is on the right 15% is on the left.  He has never had problems with neck pain arm discomfort.  Patient is on home oxygen at night and has a history of emphysema.  Dr. Keturah Barre is his pulmonologist.  He says he stopped smoking 30 years ago.  Patient notes that he drinks between 6-7 alcoholic beverages a day and states that he drinks with meals.  I advised him of the importance of getting rid of Tylenol given his significant alcohol intake.        PAST MEDICAL/SURGICAL HISTORY   (Detailed)  Disease/disorder Onset Date Management Date Comments  Arthritis      Emphysema      High cholesterol          PAST MEDICAL HISTORY, SURGICAL HISTORY, FAMILY HISTORY, SOCIAL HISTORY AND REVIEW OF SYSTEMS I have reviewed the patient's past medical, surgical, family and social history as well as the comprehensive review of systems as included on the [facility] history form dated 06/03/2014, which I have signed.  Family  History  (Detailed) Patient reports there is no relevant family history.   SOCIAL HISTORY  (Detailed) Tobacco use reviewed. Preferred language is Unknown.   Smoking status: Former smoker.  SMOKING STATUS Use Status Type Smoking Status Usage Per Day Years Used Total Pack Years  yes  Former smoker       HOME ENVIRONMENT/SAFETY The patient has not fallen in the last year.        MEDICATIONS(added, continued or stopped this visit):   Started Medication Directions Instruction Stopped   Advair Diskus 250 mcg-50 mcg/dose powder for inhalation inhale 1 puff by inhalation route 2 times every day in the morning and evening approximately 12 hours apart     amlodipine 10 mg tablet take 1 tablet by oral route  every day     aspirin 325 mg tablet take 2 tablet by oral route prior to working out     atorvastatin 20 mg tablet take 1 tablet by oral route  every day     cholecalciferol (vitamin D3) 1,000 unit tablet 1 tablet daily     cyanocobalamin (vit B-12) 1,000 mcg tablet Take 5 tablets daily     finasteride 5 mg tablet take 1 tablet by oral route  every day     hydrochlorothiazide 25 mg tablet take 1 tablet by oral route  every day  multivitamin with minerals tablet 1 tablet daily    06/03/2014 oxycodone 5 mg tablet Take 1 po up to QID prn pain     ProAir HFA 90 mcg/actuation aerosol inhaler inhale 1 - 2 puffs by inhalation route  every 4 hours as needed     tamsulosin ER 0.4 mg capsule,extended release 24 hr take 1 capsule by oral route  every day 1/2 hour following the same meal each day      ALLERGIES:  Ingredient Reaction Medication Name Comment  NO KNOWN ALLERGIES     No known allergies.   REVIEW OF SYSTEMS System Neg/Pos Details  Constitutional Negative Chills, fatigue, fever, malaise, night sweats, weight gain and weight loss.  ENMT Negative Ear drainage, hearing loss, nasal drainage, otalgia, sinus pressure and sore throat.  Eyes Negative Eye discharge, eye pain and  vision changes.  Respiratory Negative Chronic cough, cough, dyspnea, known TB exposure and wheezing.  Cardio Negative Chest pain, claudication, edema and irregular heartbeat/palpitations.  GI Negative Abdominal pain, blood in stool, change in stool pattern, constipation, decreased appetite, diarrhea, heartburn, nausea and vomiting.  GU Negative Dribbling, dysuria, erectile dysfunction, hematuria, polyuria, slow stream, urinary frequency, urinary incontinence and urinary retention.  Endocrine Negative Cold intolerance, heat intolerance, polydipsia and polyphagia.  Neuro Negative Dizziness, extremity weakness, gait disturbance, headache, memory impairment, numbness in extremity, seizures and tremors.  Psych Negative Anxiety, depression and insomnia.  Integumentary Negative Brittle hair, brittle nails, change in shape/size of mole(s), hair loss, hirsutism, hives, pruritus, rash and skin lesion.  MS Positive Back pain, BLE pain.  Hema/Lymph Negative Easy bleeding, easy bruising and lymphadenopathy.  Allergic/Immuno Negative Contact allergy, environmental allergies, food allergies and seasonal allergies.  Reproductive Negative Penile discharge and sexual dysfunction.    Vitals Date Temp F BP Pulse Ht In Wt Lb BMI BSA Pain Score  06/03/2014  155/67 81 68 162 31.64  3/10     PHYSICAL EXAM General Level of Distress: no acute distress Overall Appearance: normal    Cardiovascular Cardiac: regular rate and rhythm without murmur  Respiratory Lungs: clear to auscultation  Neurological Recent and Remote Memory: normal Attention Span and Concentration:   normal Language: normal Fund of Knowledge: normal  Right Left Sensation: normal normal Upper Extremity Coordination: normal normal  Lower Extremity Coordination: normal normal  Musculoskeletal Gait and Station: normal  Right Left Upper Extremity Muscle Strength: normal normal Lower Extremity Muscle Strength: normal normal Upper  Extremity Muscle Tone:  normal normal Lower Extremity Muscle Tone: normal normal  Motor Strength Upper and lower extremity motor strength was tested in the clinically pertinent muscles. Any abnormal findings will be noted below.   Right Left Hip Flexor: 4/5    Deep Tendon Reflexes  Right Left Biceps: increase increase Triceps: increase increase Brachiloradialis: increase increase Patellar: increase increase Achilles: increase increase  Sensory Sensation was tested at L1 to S1. Any abnormal findings will be noted below.  Right Left L2: hyperpathic    Cranial Nerves II. Optic Nerve/Visual Fields: normal III. Oculomotor: normal IV. Trochlear: normal V. Trigeminal: normal VI. Abducens: normal VII. Facial: normal VIII. Acoustic/Vestibular: normal IX. Glossopharyngeal: normal X. Vagus: normal XI. Spinal Accessory: normal XII. Hypoglossal: normal  Motor and other Tests Lhermittes: negative Rhomberg: negative    Right Left Hoffman's: normal normal Clonus: normal normal Babinski: normal normal SLR: positive at 40 degrees negative Patrick's Corky Sox): negative negative Toe Walk: normal normal Toe Lift: normal normal Heel Walk: normal normal SI Joint: nontender nontender  DIAGNOSTIC RESULTS Diagnostic report text  CLINICAL DATA: Low back pain extending into the hips and femurs bilaterally over the last year. Remote history of right femur surgery over 60 years ago.  EXAM: MRI LUMBAR SPINE WITHOUT CONTRAST  TECHNIQUE: Multiplanar, multisequence MR imaging of the lumbar spine was performed. No intravenous contrast was administered.  COMPARISON: None.  FINDINGS: Normal signal is present in the conus medullaris which terminates at L2, within normal limits. Chronic endplate marrow changes are present at L2-3, worse on the right, at L3-4, also worse on the right, and at L4-5, worse on the left. Bilateral endplate changes are noted at L5-S1. Limited imaging  of the abdomen demonstrates muscular atrophy without a focal lesion.  L1-2: Negative.  L2-3: A large synovial cyst is present on the right extending into the central canal and resulting in severe right subarticular stenosis. Moderate facet hypertrophy is present bilaterally, worse on the right. This contributes to the stenosis. Mild right foraminal stenosis is present.  L3-4: A broad-based disc protrusion is present. Moderate facet hypertrophy is noted bilaterally. Moderate subarticular and foraminal stenosis is present on the right. Mild left subarticular and foraminal stenosis is present.  L4-5: Asymmetric left-sided facet hypertrophy is present. Asymmetric epidural fat on the left contributes with the facet hypertrophy to result in moderate left subarticular stenosis. Mild right subarticular narrowing is evident. Moderate left and mild right foraminal stenosis is present.  L5-S1: Grade 1 anterolisthesis and uncovering of a broad-based disc protrusion is present. Moderate facet hypertrophy is noted with mild subarticular stenosis bilaterally. There is mild foraminal narrowing bilaterally.  IMPRESSION: 1. Severe right subarticular stenosis at L2-3 secondary to a disc protrusion and large right synovial cyst associated with facet hypertrophy. 2. Mild right foraminal stenosis at L2-3. 3. Moderate subarticular and foraminal stenosis on the right and mild subarticular and foraminal stenosis on the left at L3-4. 4. Moderate left and mild right subarticular and foraminal stenosis at L4-5 secondary to facet hypertrophy. 5. Grade 1 anterolisthesis and uncovering of a broad-based disc protrusion with mild subarticular stenosis and foraminal narrowing bilaterally at L5-S1.   Electronically Signed By: Lawrence Santiago M.D. On: 05/28/2014 11:29    IMPRESSION Patient has multiple medical comorbidities Zima and home oxygen, vasculopathy, alcohol abuse and has significant spinal  pathology on his MRI but I believe the majority of this is chronic and related to his current complaint and it is current issues relate to a large synovial cyst at L2-3 on the right.  His pain level is severe and he has significant weakness on examination.  I therefore recommended pursuing surgery and this would consist of right L2-3 laminectomy with resection of synovial cyst.  This will be performed at [facility] on November 19.  Completed Orders (this encounter) Order Details Reason Side Interpretation Result Initial Treatment Date Region  Lumbar Spine- AP/Lat/Obls/Spot/Flex/Ex      06/03/2014 All Levels to All Levels   Assessment/Plan # Detail Type Description   1. Assessment Spinal stenosis of lumbar region (M48.06).       2. Assessment Spondylolisthesis, lumbar region (M43.16).       3. Assessment Idiopathic scoliosis of lumbar region (M41.26).       4. Assessment Synovial cyst (M71.30).       5. Assessment Lumbar radiculopathy (M54.16).         Pain Assessment/Treatment Pain Scale: 3/10. Method: Numeric Pain Intensity Scale. Location: back/neck/legs. Onset: 12/02/2013. Duration: varies. Quality: discomforting. Pain Assessment/Treatment follow-up plan of care: Patient is not currently  taking any medication for pain..  Fall Risk Plan The patient has not fallen in the last year.  Patient and his wife are aware of risks and benefits of surgery and wished to proceed.  Orders: Diagnostic Procedures: Assessment Procedure  M54.16 Lumbar Spine- AP/Lat/Obls/Spot/Flex/Ex  M71.30  right - L2-L3 laminectomy with resection of synovial cyst    MEDICATIONS PRESCRIBED TODAY    Rx Quantity Refills  OXYCODONE HCL 5 mg  120 0            Provider:  Marchia Meiers. Vertell Limber MD  06/06/2014 03:20 PM Dictation edited by: Marchia Meiers. Vertell Limber    CC Providers: [facility] [address] [place], [place]  [id number]- ----------------------------------------------------------------------------------------------------------------------------------------------------------------------         Electronically signed by Marchia Meiers. Vertell Limber MD on 06/06/2014 03:20 PM

## 2014-07-02 NOTE — Brief Op Note (Signed)
07/02/2014  2:21 PM  PATIENT:  Jeffrey Meadows  80 y.o. 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, scoliosis, spondyolisthesis and radiculopathy  PROCEDURE:  Procedure(s): Right Lumbar two-three Laminectomy for Synovial Cyst (Right) with resection of synovial cyst with microdissection  SURGEON:  Surgeon(s) and Role:    * Erline Levine, MD - Primary    * Eustace Moore, MD - Assisting  PHYSICIAN ASSISTANT:   ASSISTANTS: Poteat, RN   ANESTHESIA:   general  EBL:  Total I/O In: 1350 [I.V.:1350] Out: -   BLOOD ADMINISTERED:none  DRAINS: none   LOCAL MEDICATIONS USED:  LIDOCAINE   SPECIMEN:  Excision  DISPOSITION OF SPECIMEN:  PATHOLOGY  COUNTS:  YES  TOURNIQUET:  * No tourniquets in log *  DICTATION: Patient has a large synovial cyst at L 23 on the right with Grade I spondylolisthesis, scoliosis and severe stenosis. It was elected to take him to surgery for right L 23 resection of synovial cyst.  Procedure: Patient was brought to the operating room and following the smooth and uncomplicated induction of general endotracheal anesthesia he was placed in a prone position on the Wilson frame. Low back was prepped and draped in the usual sterile fashion with betadine scrub and DuraPrep. Preoperative localizing X ray confirmed correct level. Area of planned incision was infiltrated with local lidocaine. Incision was made in the midline and carried to the lumbodorsal fascia which was incised on the right side of midline. Subperiosteal dissection was performed exposing what was felt to be L 23 level. Intraoperative x-ray demonstrated marker probe at L 23.  A laminotomy of L 2 was performed as well as a foraminotomy overlying L 3.Ligamentum was mobilized and detached, exposing the common dural tube  and  L 3 nerve root. The microscope was brought into the field and the L3 nerve root was mobilized medially and carefully dissected from the synovial cyst which was adherent to the dura.  Using microdissection, the cyst was removed and neural elements thoroughly decompressed. This was an extremely large cyst which extended to the midline and was filled with solid gelatinous material.  This was sent to pathology for evaluation.  The cyst was exenterated and peeled from the dura. There was no violation of the dura.  Hemostasis was assured with bipolar electrocautery and the interspace was irrigated with Depo-Medrol and fentanyl. The lumbodorsal fascia was closed with 0 Vicryl sutures the subcutaneous tissues reapproximated 2-0 Vicryl inverted sutures and the skin edges were reapproximated with 3-0 Vicryl subcuticular stitch. The wound is dressed with Dermabond and an occlusive dressing. Patient was extubated in the operating room and taken to recovery in stable and satisfactory condition having tolerated his operation well counts were correct at the end of the case.  PLAN OF CARE: Admit for overnight observation  PATIENT DISPOSITION:  PACU - hemodynamically stable.   Delay start of Pharmacological VTE agent (>24hrs) due to surgical blood loss or risk of bleeding: yes

## 2014-07-02 NOTE — Progress Notes (Signed)
Utilization review completed.  

## 2014-07-02 NOTE — Op Note (Signed)
07/02/2014  2:21 PM  PATIENT:  Jeffrey Meadows  80 y.o. 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, scoliosis, spondyolisthesis and radiculopathy  PROCEDURE:  Procedure(s): Right Lumbar two-three Laminectomy for Synovial Cyst (Right) with resection of synovial cyst with microdissection  SURGEON:  Surgeon(s) and Role:    * Erline Levine, MD - Primary    * Eustace Moore, MD - Assisting  PHYSICIAN ASSISTANT:   ASSISTANTS: Poteat, RN   ANESTHESIA:   general  EBL:  Total I/O In: 1350 [I.V.:1350] Out: -   BLOOD ADMINISTERED:none  DRAINS: none   LOCAL MEDICATIONS USED:  LIDOCAINE   SPECIMEN:  Excision  DISPOSITION OF SPECIMEN:  PATHOLOGY  COUNTS:  YES  TOURNIQUET:  * No tourniquets in log *  DICTATION: Patient has a large synovial cyst at L 23 on the right with Grade I spondylolisthesis, scoliosis and severe stenosis. It was elected to take him to surgery for right L 23 resection of synovial cyst.  Procedure: Patient was brought to the operating room and following the smooth and uncomplicated induction of general endotracheal anesthesia he was placed in a prone position on the Wilson frame. Low back was prepped and draped in the usual sterile fashion with betadine scrub and DuraPrep. Preoperative localizing X ray confirmed correct level. Area of planned incision was infiltrated with local lidocaine. Incision was made in the midline and carried to the lumbodorsal fascia which was incised on the right side of midline. Subperiosteal dissection was performed exposing what was felt to be L 23 level. Intraoperative x-ray demonstrated marker probe at L 23.  A laminotomy of L 2 was performed as well as a foraminotomy overlying L 3.Ligamentum was mobilized and detached, exposing the common dural tube  and  L 3 nerve root. The microscope was brought into the field and the L3 nerve root was mobilized medially and carefully dissected from the synovial cyst which was adherent to the dura.  Using microdissection, the cyst was removed and neural elements thoroughly decompressed. This was an extremely large cyst which extended to the midline and was filled with solid gelatinous material.  This was sent to pathology for evaluation.  The cyst was exenterated and peeled from the dura. There was no violation of the dura.  Hemostasis was assured with bipolar electrocautery and the interspace was irrigated with Depo-Medrol and fentanyl. The lumbodorsal fascia was closed with 0 Vicryl sutures the subcutaneous tissues reapproximated 2-0 Vicryl inverted sutures and the skin edges were reapproximated with 3-0 Vicryl subcuticular stitch. The wound is dressed with Dermabond and an occlusive dressing. Patient was extubated in the operating room and taken to recovery in stable and satisfactory condition having tolerated his operation well counts were correct at the end of the case.  PLAN OF CARE: Admit for overnight observation  PATIENT DISPOSITION:  PACU - hemodynamically stable.   Delay start of Pharmacological VTE agent (>24hrs) due to surgical blood loss or risk of bleeding: yes

## 2014-07-02 NOTE — OR Nursing (Signed)
Fentanyl 62mcg/ml total 2 ml given by Dr Vertell Limber to operative site

## 2014-07-02 NOTE — Interval H&P Note (Signed)
History and Physical Interval Note:  07/02/2014 8:49 AM  Jeffrey Meadows  has presented today for surgery, with the diagnosis of Synovial cyst  The various methods of treatment have been discussed with the patient and family. After consideration of risks, benefits and other options for treatment, the patient has consented to  Procedure(s) with comments: Right L2-3 Laminectomy for Synovial Cyst (Right) - Right L2-3 Laminectomy for Synovial Cyst as a surgical intervention .  The patient's history has been reviewed, patient examined, no change in status, stable for surgery.  I have reviewed the patient's chart and labs.  Questions were answered to the patient's satisfaction.     Maeghan Canny D

## 2014-07-03 ENCOUNTER — Encounter (HOSPITAL_COMMUNITY): Payer: Self-pay | Admitting: Neurosurgery

## 2014-07-03 NOTE — Progress Notes (Signed)
Patient alert and oriented, mae's well, voiding adequate amount of urine, swallowing without difficulty, no c/o pain. Patient discharged home with family. Script and discharged instructions given to patient. Patient and family stated understanding of d/c instructions given and has an appointment with MD. Aisha Jaszmine Navejas RN 

## 2014-07-03 NOTE — Progress Notes (Signed)
Subjective: Patient reports "I'm not hurting"  Objective: Vital signs in last 24 hours: Temp:  [97.5 F (36.4 C)-98.5 F (36.9 C)] 98.2 F (36.8 C) (11/20 0353) Pulse Rate:  [74-92] 74 (11/20 0353) Resp:  [13-20] 16 (11/20 0353) BP: (137-158)/(63-87) 137/66 mmHg (11/20 0353) SpO2:  [93 %-100 %] 95 % (11/20 0353) Weight:  [72.802 kg (160 lb 8 oz)] 72.802 kg (160 lb 8 oz) (11/19 1033)  Intake/Output from previous day: 11/19 0701 - 11/20 0700 In: 1950 [P.O.:600; I.V.:1350] Out: 50 [Blood:50] Intake/Output this shift:    Alert, conversant, reporting relief of leg pain. Strength full BLE. Incision without erythema, swelling, or drainage.   Lab Results: No results for input(s): WBC, HGB, HCT, PLT in the last 72 hours. BMET No results for input(s): NA, K, CL, CO2, GLUCOSE, BUN, CREATININE, CALCIUM in the last 72 hours.  Studies/Results: Dg Lumbar Spine 2-3 Views  07/02/2014   CLINICAL DATA:  Localization during L2-3 lumbar laminectomy.  EXAM: LUMBAR SPINE - 2-3 VIEW  COMPARISON:  Lumbar spine films on 06/03/2014 and lumbar MRI on 05/28/2014.  FINDINGS: Cross table lateral views were obtained intraoperatively. First film at 1300 hr demonstrates initial placement of a needle posteriorly situated between the spinous processes of L2 and L3. Second film at 1312 hr shows placement of posterior retractor and instrument at the L2-3 level with the instrument tip lying just posterior to the spinal canal.  IMPRESSION: Intraoperative localization of the L2-3 level during spinal surgery.   Electronically Signed   By: Aletta Edouard M.D.   On: 07/02/2014 14:42    Assessment/Plan: Improved    LOS: 1 day  D/C IV, D/C to home per DrStern. Pt verballizes understanding of d/c instructions and agrees to call office to schedule 3-4 week f/u visit. He has pain medication at home for prn use.   Jeffrey Meadows 07/03/2014, 7:33 AM

## 2014-07-03 NOTE — Discharge Summary (Signed)
Physician Discharge Summary  Patient ID: Jeffrey Meadows MRN: [id number] DOB/AGE: Apr 25, 1933 80 y.o.  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  Following uncomplicated laminectomy with cyst resection, he recovered nicely in Neuro PACU and transferred to 3500 for observation.  Consults: None  Significant Diagnostic Studies: radiology: X-Ray: intra-operative  Treatments: surgery: Right Lumbar two-three Laminectomy for Synovial Cyst (Right) with resection of synovial cyst with microdissection   Discharge Exam: Blood pressure 137/66, pulse 74, temperature 98.2 F (36.8 C), temperature source Oral, resp. rate 16, height 5\' 8"  (1.727 m), weight 72.802 kg (160 lb 8 oz), SpO2 95 %. Alert, conversant, reporting relief of leg pain. Strength full BLE. Incision without erythema, swelling, or drainage.    Disposition:  Discharge to home. Pt verballizes understanding of d/c instructions and agrees to call office to schedule 3-4 week f/u visit. He has pain medication at home for prn use.      Medication List    ASK your doctor about these medications        ADVAIR DISKUS 250-50 MCG/DOSE Aepb  Generic drug:  Fluticasone-Salmeterol  INHALE 1 PUFF BY MOUTH TWICE DAILY THEN RINSE MOUTH     albuterol 108 (90 BASE) MCG/ACT inhaler  Commonly known as:  PROAIR HFA  Inhale 1-2 puffs into the lungs every 4  (four) hours as needed. as needed shortness of breath prior to exercise     amLODipine 10 MG tablet  Commonly known as:  NORVASC  Take 1 tablet (10 mg total) by mouth daily.     amoxicillin-clavulanate 875-125 MG per tablet  Commonly known as:  AUGMENTIN  Take 1 tablet by mouth 2 (two) times daily.     atorvastatin 20 MG tablet  Commonly known as:  LIPITOR  Take 1 tablet (20 mg total) by mouth daily at 6 PM.     CALCIPOTRIENE EX  Apply 1 application topically 2 (two) times daily.     cholecalciferol 1000 UNITS tablet  Commonly known as:  VITAMIN D  Take 1,000 Units by mouth daily.     cyanocobalamin 1000 MCG tablet  Take 5,000 mcg by mouth daily.     finasteride 5 MG tablet  Commonly known as:  PROSCAR  Take 1 tablet (5 mg total) by mouth daily.     hydrochlorothiazide 25 MG tablet  Commonly known as:  HYDRODIURIL  TAKE 1 TABLET BY MOUTH EVERY MORNING     mometasone 0.1 % lotion  Commonly known as:  ELOCON  Apply topically 2 (two) times daily as needed.     oxyCODONE 5 MG immediate release tablet  Commonly known as:  Oxy IR/ROXICODONE  Take 5 mg by mouth 4 (four) times daily.     oxyCODONE-acetaminophen 5-325 MG per tablet  Commonly known as:  ROXICET  Take 1 tablet by mouth 4 (four) times daily.     potassium chloride 10 MEQ tablet  Commonly known as:  K-DUR  Take 1 tablet (10 mEq total) by mouth 4 (four) times  daily.     PredniSONE 10 MG Kit  Take in tapering course as directed 6-6-5-5-4-4-3-3-2-2-1-1     tamsulosin 0.4 MG Caps capsule  Commonly known as:  FLOMAX  Take 1 capsule (0.4 mg total) by mouth daily.     therapeutic multivitamin-minerals tablet  Take 1 tablet by mouth daily.         Signed: Verdis Prime 07/03/2014, 7:36 AM

## 2014-07-03 NOTE — Addendum Note (Signed)
Addendum  created 07/03/14 0815 by Rogers Blocker, CRNA   Modules edited: Charges VN

## 2014-07-05 ENCOUNTER — Other Ambulatory Visit: Payer: Self-pay | Admitting: Internal Medicine

## 2014-07-13 ENCOUNTER — Telehealth: Payer: Self-pay | Admitting: Internal Medicine

## 2014-07-13 MED ORDER — OXYCODONE HCL 5 MG PO TABS: 5.0000 mg | ORAL_TABLET | Freq: Four times a day (QID) | ORAL | Status: DC

## 2014-07-13 NOTE — Telephone Encounter (Signed)
Written prescription for #90 oxycodone 5 mg without Tylenol. One by mouth every 8 hours when necessary pain.

## 2014-07-13 NOTE — Telephone Encounter (Signed)
Needs refill for Oxycode (not with Tylenol) 5mg .  She would like to pick up today after lunch.  Advised we'll call her if by chance we have ready to pick up earlier.

## 2014-07-30 ENCOUNTER — Encounter: Payer: Self-pay | Admitting: Internal Medicine

## 2014-07-30 ENCOUNTER — Ambulatory Visit (INDEPENDENT_AMBULATORY_CARE_PROVIDER_SITE_OTHER): Payer: Medicare Other | Admitting: Internal Medicine

## 2014-07-30 VITALS — BP 122/68 | HR 90 | Ht 67.0 in | Wt 158.0 lb

## 2014-07-30 DIAGNOSIS — J432 Centrilobular emphysema: Secondary | ICD-10-CM

## 2014-07-30 MED ORDER — PREDNISONE 10 MG PO TABS: ORAL_TABLET | ORAL | Status: DC

## 2014-07-30 MED ORDER — AZITHROMYCIN 250 MG PO TABS: ORAL_TABLET | ORAL | Status: DC

## 2014-07-30 NOTE — Assessment & Plan Note (Signed)
Tolerated general anesthesia for surgery-cyst on spine. We discussed management this winter. Plan-prescriptions to hold for prednisone taper and Z-Pak with appropriate discussion.

## 2014-07-30 NOTE — Progress Notes (Signed)
08/29/12- 69 yoM former smoker (60 pk yrs)referred courtesy of Dr Candelaria Celeste because of emphysema. Wife is here. Smoked 60 pack years prior to 72. He has been aware of dyspnea on exertion a diagnosis of emphysema for about 4 years. There has been gradual progression. Little cough except with colds. He is okay with activities of daily living and his breathing does not wake. He exercises regularly for his hip and leg with an exercycle. Up to date on flu and pneumonia vaccines He denies history of asthma or pneumonia but has had some seasonal allergic rhinitis. No heart disease or anemia. Family history is negative for lung disease. 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 CXR 01/13/10 IMPRESSION:  COPD/chronic changes.  Provider: Felipe Drone  10/04/12- 79 yoM former smoker (60 pk yrs)referred courtesy of Dr Candelaria Celeste because of emphysema. Wife is here. Smoked 60 pack years prior to 62. FOLLOWS FOR: review PFT and 6MW results with patient.  He reports no change since last here. He tries to walk on a treadmill and I emphasized the value of maintaining aerobic activity. He has enlarged prostate so we are avoiding Spiriva. CXR 09/11/12-IMPRESSION:  Stable COPD. No active disease.  Original Report Authenticated By: Lahoma Crocker, M.D. PFT- 10/04/2012-severe obstructive airways disease with insignificant response to bronchodilator, emphysema pattern. Air-trapping with increased residual volume. Diffusion moderately reduced. FVC 3.03/78%, FEV11.11/44%, FEV1/FVC 0.37. TLC 115%, RV 137%, DLCO 52%. 6MWT- 10/04/2012-92%, 83%, 91%, 336 m. Significant desaturation with exertion.  12/20/12-79 yoM former smoker (60 pk yrs)referred courtesy of Dr Candelaria Celeste because of emphysema. Wife is here. Smoked 60 pack years prior to 23. FOLLOWS FOR: Reports having a productive cough x2 weeks. Mucus is yellow. Has been more fatigued than normal and gets lightheaded from  time to time. Positive for chest tightness and SOB. Has used several OTC medications without relief.  12/30/12-79 yoM former smoker (60 pk yrs)referred courtesy of Dr Candelaria Celeste because of emphysema. Wife is here. Smoked 60 pack years prior to 7. FOLLOWS FOR: mostly dry cough-at times will be productive-ivory in color. Denies any SOB or wheezing.  Dry cough persists. Overnight oximetry to 10/09/12 documented need for oxygen during sleep. We discussed oxygen therapy. He asks about maintenance steroid therapy and we discussed this carefully. O2 2L sleep/ APS.  03/24/13- 45 yoM former smoker (60 pk yrs)r followed for COPD/ emphysema. Wife is here. Smoked 60 pack years prior to 19. FOLLOWS FXT:KWIOXB any SOB, wheezing or cough. Wife states patient has quite a bit of congestion (more in throat area) O2 2L sleep/ APS Note "gravelly " voice and throat congestion- speaking engagements pending. Scant white sputum, no infection. Felt better while on prednisone. Steroid talk done. Advair now 3rd tier.   06/23/13- 72 yoM former smoker (60 pk yrs)referred courtesy of Dr Candelaria Celeste because of emphysema. Wife is here. Smoked 60 pack years prior to 56. FOLLOWS FOR: Pt states he continues to have SOB(? worsened with activity). Wife thinks dyspnea on exertion may be better. Rare cough. No acute events. Anoro did not help.  10/22/13- 89 yoM former smoker (60 pk yrs)referred courtesy of Dr Candelaria Celeste because of emphysema. Wife is here. Smoked 60 pack years prior to 58. FOLLOWS FOR: Pt denies any SOB or wheezing. Pt states he continues to wear O2 at night but has to take it off at times due to dryness. They deny cough or wheeze. No acute change. Little exercise tolerance.  07/30/14- 31 yoM former smoker (60 pk yrs)referred courtesy of Dr Candelaria Celeste because of emphysema. Wife is here. Smoked 60 pack years prior to 1987 Follows QHU:TMLY increase in sob - Denies cough, wheeze or chest tightness - Had spinal  surgery 3 weeks ago Mild lightheadedness and may be some increased dyspnea on exertion but nothing specific  ROS-see HPI Constitutional:   No-   weight loss, night sweats, fevers, chills, fatigue, lassitude. HEENT:   No-  headaches, difficulty swallowing, tooth/dental problems, sore throat,       No-  sneezing, itching, ear ache, nasal congestion, post nasal drip,  CV:  No-   chest pain, orthopnea, PND, swelling in lower extremities, anasarca,  +dizziness, palpitations Resp: +  shortness of breath with exertion or at rest.             No- productive cough,  No non-productive cough,  No- coughing up of blood.              No-   change in color of mucus.  No- wheezing.   Skin: No-   rash or lesions. GI:  No-   heartburn, indigestion, abdominal pain, nausea, vomiting, GU: MS:  No-   joint pain or swelling. . Neuro-     nothing unusual Psych:  No- change in mood or affect. No depression or anxiety.  No memory loss.  OBJ- Physical Exam General- Alert, Oriented, Affect-appropriate, Distress- none acute, trim Skin- rash-none, lesions- none, excoriation- none, pale Lymphadenopathy- none Head- atraumatic            Eyes- Gross vision intact, PERRLA, conjunctivae and secretions clear            Ears- Hearing, canals-normal            Nose- Clear, no-Septal dev, mucus, polyps, erosion, perforation             Throat- Mallampati III , mucosa clear , drainage- none, tonsils- atrophic. No thrush Neck- flexible , trachea midline, no stridor , thyroid nl, carotid no bruit Chest - symmetrical excursion , unlabored           Heart/CV- RRR , no murmur , no gallop  , no rub, nl s1 s2                           - JVD- none , edema- none, stasis changes- none, varices- none           Lung- +distant but clear to P&A, wheeze- none, cough-none , dullness-none, rub- none.            Chest wall-  Abd-  Br/ Gen/ Rectal- Not done, not indicated Extrem- cyanosis- none, clubbing, none, atrophy- none, strength-  nl Neuro- grossly intact to observation

## 2014-07-30 NOTE — Patient Instructions (Signed)
Scripts sent for you to hold for now- Z pak, prednisone taper  Please call as needed

## 2014-08-08 ENCOUNTER — Other Ambulatory Visit: Payer: Self-pay | Admitting: Internal Medicine

## 2014-08-14 HISTORY — PX: SPINE SURGERY: SHX786

## 2014-08-19 ENCOUNTER — Telehealth: Payer: Self-pay | Admitting: Internal Medicine

## 2014-08-19 NOTE — Telephone Encounter (Signed)
Please inquire how often pt needing pain meds. Would have hoped back surgery would have helped pain. Is it his hip instead?

## 2014-08-19 NOTE — Telephone Encounter (Signed)
Wife is calling for a refill on patient's Oxycodone 5 mg immediate release tablet.  They would like to pick this up either Thursday afternoon or Friday.   Thanks so much.

## 2014-08-21 ENCOUNTER — Telehealth: Payer: Self-pay | Admitting: *Deleted

## 2014-08-21 MED ORDER — OXYCODONE HCL 5 MG PO TABS: 5.0000 mg | ORAL_TABLET | Freq: Two times a day (BID) | ORAL | Status: DC | PRN

## 2014-08-21 NOTE — Telephone Encounter (Signed)
Refill #60 one po twice daily

## 2014-08-21 NOTE — Telephone Encounter (Signed)
Notified patient script is available for pick up on Oxycodone

## 2014-08-21 NOTE — Telephone Encounter (Signed)
Patient states he is taking Oxycodone 1-2 times daily for arthritis pain in lower lumbar and hip areas. States his pain level has improved since surgery but he still has pain from arthritis. Do you want this medication refilled?  Please advise.

## 2014-09-09 ENCOUNTER — Telehealth: Payer: Self-pay | Admitting: Internal Medicine

## 2014-09-09 MED ORDER — AZITHROMYCIN 250 MG PO TABS: ORAL_TABLET | ORAL | Status: DC

## 2014-09-09 NOTE — Telephone Encounter (Signed)
Called and spoke with pts wife and she is aware of CY recs to repeat the zpak.  This has been sent to the pts pharmacy and nothing further is needed.

## 2014-09-09 NOTE — Telephone Encounter (Signed)
Ok to repeat Z pak

## 2014-09-09 NOTE — Telephone Encounter (Signed)
Spoke with pt's wife. States that pt is still having issues with cough and congestion. He has finished his Zpack and is almost done with prednisone. They would like to know if another Zpack could be sent in.  Allergies  Allergen Reactions  . Other     ANTIHISTAMINES---Enlarged Prostate   CY - please advise. Thanks.

## 2014-09-14 ENCOUNTER — Telehealth: Payer: Self-pay | Admitting: Internal Medicine

## 2014-09-14 MED ORDER — PREDNISONE 10 MG PO TABS: ORAL_TABLET | ORAL | Status: DC

## 2014-09-14 MED ORDER — BENZONATATE 200 MG PO CAPS: 200.0000 mg | ORAL_CAPSULE | Freq: Three times a day (TID) | ORAL | Status: DC | PRN

## 2014-09-14 NOTE — Telephone Encounter (Signed)
Ok Rx prednisone 10 mg, # 20, 4 X 2 DAYS, 3 X 2 DAYS, 2 X 2 DAYS, 1 X 2 DAYS  No ref             Benzonatate 200 mg, # 30, 1 every 8 hours as needed for cough, ref x 1

## 2014-09-14 NOTE — Telephone Encounter (Signed)
Spoke with pt's wife. States that pt is not having any issues at this time. Would like to have prednisone and benzonate on hand when he has a flare up.  Allergies  Allergen Reactions  . Other     ANTIHISTAMINES---Enlarged Prostate    CY - please advise.  Thanks.

## 2014-09-14 NOTE — Telephone Encounter (Signed)
Spoke with pt's wife, she is aware that we are calling in these meds for pt.  meds sent in.  Nothing further needed.

## 2014-10-30 ENCOUNTER — Telehealth: Payer: Self-pay | Admitting: Vascular Surgery

## 2014-10-30 NOTE — Telephone Encounter (Signed)
-----   Message from Denman George, RN sent at 10/30/2014 12:57 PM EDT ----- Regarding: nurse visit 3/21 Please schedule a nurse visit on Monday, 3/21 to be measured for compression stockings.  (Pt. is aware)

## 2014-11-02 ENCOUNTER — Encounter (INDEPENDENT_AMBULATORY_CARE_PROVIDER_SITE_OTHER): Payer: Medicare Other

## 2014-11-02 DIAGNOSIS — I739 Peripheral vascular disease, unspecified: Secondary | ICD-10-CM

## 2014-11-04 ENCOUNTER — Telehealth: Payer: Self-pay | Admitting: Internal Medicine

## 2014-11-04 NOTE — Telephone Encounter (Signed)
Needs refill Rx on his OxyCODONE 5mg  immediate release tablet.  Advised they could pick up the Rx on Thursday, 3/24.    Thanks.

## 2014-11-05 ENCOUNTER — Other Ambulatory Visit: Payer: Self-pay | Admitting: *Deleted

## 2014-11-05 MED ORDER — OXYCODONE HCL 5 MG PO TABS: 5.0000 mg | ORAL_TABLET | Freq: Two times a day (BID) | ORAL | Status: DC | PRN

## 2014-11-05 NOTE — Telephone Encounter (Signed)
Patients Oxy IR script printed and ready to be signed

## 2014-11-05 NOTE — Telephone Encounter (Signed)
Please print up

## 2014-12-03 ENCOUNTER — Other Ambulatory Visit: Payer: Self-pay | Admitting: Internal Medicine

## 2014-12-08 ENCOUNTER — Ambulatory Visit (HOSPITAL_COMMUNITY)
Admission: RE | Admit: 2014-12-08 | Discharge: 2014-12-08 | Disposition: A | Discharge status: Home / Self Care | Payer: Medicare Other | Source: Ambulatory Visit | Attending: Vascular Surgery | Admitting: Vascular Surgery

## 2014-12-08 ENCOUNTER — Encounter (INDEPENDENT_AMBULATORY_CARE_PROVIDER_SITE_OTHER): Payer: Self-pay

## 2014-12-08 DIAGNOSIS — Z48812 Encounter for surgical aftercare following surgery on the circulatory system: Secondary | ICD-10-CM

## 2014-12-08 DIAGNOSIS — I739 Peripheral vascular disease, unspecified: Secondary | ICD-10-CM | POA: Diagnosis not present

## 2014-12-18 ENCOUNTER — Encounter: Payer: Self-pay | Admitting: Family

## 2014-12-22 ENCOUNTER — Other Ambulatory Visit: Payer: Self-pay | Admitting: *Deleted

## 2014-12-22 ENCOUNTER — Ambulatory Visit (INDEPENDENT_AMBULATORY_CARE_PROVIDER_SITE_OTHER): Payer: Medicare Other | Admitting: Family

## 2014-12-22 ENCOUNTER — Encounter: Payer: Self-pay | Admitting: Family

## 2014-12-22 VITALS — BP 130/82 | HR 72 | Resp 16 | Ht 68.0 in | Wt 156.0 lb

## 2014-12-22 DIAGNOSIS — I739 Peripheral vascular disease, unspecified: Secondary | ICD-10-CM | POA: Diagnosis not present

## 2014-12-22 DIAGNOSIS — Z9889 Other specified postprocedural states: Secondary | ICD-10-CM

## 2014-12-22 DIAGNOSIS — Z95828 Presence of other vascular implants and grafts: Secondary | ICD-10-CM

## 2014-12-22 DIAGNOSIS — Z87891 Personal history of nicotine dependence: Secondary | ICD-10-CM | POA: Diagnosis not present

## 2014-12-22 NOTE — Patient Instructions (Signed)

## 2014-12-22 NOTE — Progress Notes (Signed)
[facility] [place] HISTORY AND PHYSICAL -PAD  History of Present Illness Jeffrey Meadows is a 81 y.o. male who is s/p left distal SFA popliteal artery bypass graft that was completed in March 2006 by Dr. Kellie Simmering. He returns today for follow up.  Pt. denies claudication in legs with walking, denies non healing wounds. Knees and hips are painful with walking. He does have psoriasis. He exercises 3-5 days/week.  He denies any history of stroke or TIA. The patient denies New Medical or Surgical History.  Pt Diabetic: No Pt smoker: former smoker, quit in the 1980's.  Pt meds include: Statin :Yes ASA: No Other anticoagulants/antiplatelets: no     Past Medical History  Diagnosis Date  . Villous adenoma of colon 11/.2001    2 cm removed cecum  . Adenomatous polyp of colon 08/2001    removed hepatic flexure  . Hyperlipidemia     takes Atorvastatin daily  . Raynaud disease   . Urinary frequency     takes Flomax daily  . Hypertension     takes Amlodipine and HCTZ daily  . Enlarged prostate     takes Proscar daily  . Emphysema lung   . History of bronchitis     about 3-29yrs ago   . Arthritis   . Joint pain   . Joint swelling   . Chronic back pain     stenosis/scoliosis/synovial cyst  . History of blood transfusion     no abnormal reaction noted  . Cataract     Social History History  Substance Use Topics  . Smoking status: Former Smoker -- 1.50 packs/day for 40 years    Types: Cigarettes  . Smokeless tobacco: Not on file     Comment: quit smoking about 95yrs ago  . Alcohol Use: Yes     Comment: 3-5 glasses of wine daily    Family History Family History  Problem Relation Age of Onset  . Heart failure Mother   . Heart disease Mother   . Aneurysm Father     Past Surgical History  Procedure Laterality Date  . Prostate biopsy  4/95  . Total hip arthroplasty  1952    right  . Popliteal venous aneurysm repair Left 2010    about 6 yrs  ago  . Joint replacement      Right Hip  . Hernia repair Left     inguinal  . Tonsillectomy    . Cataract surgery    . Laminectomy Right 07/02/2014    Procedure: Right Lumbar two-three Laminectomy for Synovial Cyst;  Surgeon: Erline Levine, MD;  Location: [facility];  Service: Neurosurgery;  Laterality: Right;    Allergies  Allergen Reactions  . Other     ANTIHISTAMINES---Enlarged Prostate    Current Outpatient Prescriptions  Medication Sig Dispense Refill  . ADVAIR DISKUS 250-50 MCG/DOSE AEPB INHALE 1 PUFF INTO THE LUNGS TWICE DAILY. RINSE MOUTH AFTER USE. 1 each 1  . albuterol (PROAIR HFA) 108 (90 BASE) MCG/ACT inhaler Inhale 1-2 puffs into the lungs every 4 (four) hours as needed. as needed shortness of breath prior to exercise 1 Inhaler 11  . amLODipine (NORVASC) 10 MG tablet Take 1 tablet (10 mg total) by mouth daily. 90 tablet 3  . atorvastatin (LIPITOR) 20 MG tablet TAKE 1 TABLET BY MOUTH DAILY AT 6 PM 90 tablet 3  . benzonatate (TESSALON) 200 MG capsule Take 1 capsule (200 mg total) by mouth 3 (three) times daily as needed for  cough. 30 capsule 1  . CALCIPOTRIENE EX Apply 1 application topically 2 (two) times daily.     . Cholecalciferol (VITAMIN D) 2000 UNITS CAPS Take 1 capsule by mouth daily.    . cyanocobalamin 1000 MCG tablet Take 5,000 mcg by mouth daily.     . finasteride (PROSCAR) 5 MG tablet Take 1 tablet (5 mg total) by mouth daily. 90 tablet 3  . hydrochlorothiazide (HYDRODIURIL) 25 MG tablet TAKE 1 TABLET BY MOUTH EVERY MORNING 90 tablet 3  . mometasone (ELOCON) 0.1 % lotion Apply topically 2 (two) times daily as needed. (Patient taking differently: Apply 1 application topically 2 (two) times daily as needed (dermatosis). ) 60 mL 6  . oxyCODONE (OXY IR/ROXICODONE) 5 MG immediate release tablet Take 1 tablet (5 mg total) by mouth every 12 (twelve) hours as needed for severe pain. 60 tablet 0  . potassium chloride (K-DUR) 10 MEQ tablet Take 1 tablet (10 mEq total) by  mouth 4 (four) times daily. (Patient taking differently: Take 20 mEq by mouth 2 (two) times daily. ) 120 tablet 2  . tamsulosin (FLOMAX) 0.4 MG CAPS capsule Take 1 capsule (0.4 mg total) by mouth daily. (Patient taking differently: Take 0.4 mg by mouth 2 (two) times daily. ) 90 capsule 3  . therapeutic multivitamin-minerals (THERAGRAN-M) tablet Take 1 tablet by mouth daily.      Marland Kitchen azithromycin (ZITHROMAX) 250 MG tablet 2 today then one daily (Patient not taking: Reported on 12/22/2014) 6 each 0  . predniSONE (DELTASONE) 10 MG tablet 4 X 2 DAYS, 3 X 2 DAYS, 2 X 2 DAYS, 1 X 2 DAYS (Patient not taking: Reported on 12/22/2014) 20 tablet 0   No current facility-administered medications for this visit.    ROS: See HPI for pertinent positives and negatives.   Physical Examination  Filed Vitals:   12/22/14 0913  BP: 130/82  Pulse: 72  Resp: 16  Height: 5\' 8"  (1.727 m)  Weight: 156 lb (70.761 kg)  SpO2: 97%   Body mass index is 23.73 kg/(m^2).  General: A&O x 3, WDWN. Gait: normal Eyes: PERRLA. Pulmonary: CTAB, without wheezes , rales or rhonchi. Cardiac: regular Rythm , without detected murmur.     Carotid Bruits Left Right   Negative Negative  Aorta is not palpable. Radial pulses: are 2+ palpable and =.   VASCULAR EXAM: Extremities without ischemic changes  without Gangrene; without open wounds.     LE Pulses LEFT RIGHT   FEMORAL  palpable not palpable    POPLITEAL not palpable  not palpable   POSTERIOR TIBIAL not palpable   palpable    DORSALIS PEDIS  ANTERIOR TIBIAL not palpable  2+ palpable    Abdomen: soft, NT, no palpable masses. Skin: red macular psoriasis patches on anterior lower legs, no ulcers. Musculoskeletal: no muscle wasting or  atrophy. Neurologic: A&O X 3; Appropriate Affect ; SENSATION: normal; MOTOR FUNCTION: moving all extremities equally, motor strength 5/5 throughout. Speech is fluent/normal. CN 2-12 intact. Fine tremor in outstretched fingers.        Non-Invasive Vascular Imaging: DATE:  12/08/14 LOWER EXTREMITY ARTERIAL DUPLEX EVALUATION    INDICATION: Lower extremity graft for popliteal artery aneurysm repair.    PREVIOUS INTERVENTION(S): Left femoropopliteal bypass graft for aneurysm repair 10/14/2004.    DUPLEX EXAM:     RIGHT  LEFT   Peak Systolic Velocity (cm/s) Ratio (if abnormal) Waveform  Peak Systolic Velocity (cm/s) Ratio (if abnormal) Waveform     Inflow Artery 82  T  Proximal Anastomosis 35  T     Proximal Graft 20  B     Mid Graft 46  B      Distal Graft 41  B     Distal Anastomosis 55  T     Outflow Artery 67  T  [place]/1.41 Today's ABI / TBI Non-compressible  [place]/1.11 Previous ABI / TBI (12/02/2013  ) [place]/1.07    Waveform:    M - Monophasic       B - Biphasic       T - Triphasic  If Ankle Brachial Index (ABI) or Toe Brachial Index (TBI) performed, please see complete report  ADDITIONAL FINDINGS:     IMPRESSION: Patent left lower extremity bypass graft with no evidence of restenosis.     Compared to the previous exam:  No significant change in comparison to the last exam on 12/02/2013.     ASSESSMENT: Jeffrey Meadows is a 81 y.o. male who is s/p left distal SFA popliteal artery bypass graft that was completed in March 2006. He has no claudication with walking, no tissue loss. 12/08/14 left LE arterial Duplex suggests a patent left lower extremity bypass graft with no evidence of restenosis. No significant change in comparison to the last exam on 12/02/2013. ABI's are not reliable due to non compressible vessels. Right LE waveforms are biphasic, left TBI is normal at 0.74; left LE waveforms are triphasic (PT) and monophasic (DP), left TBI is 0.58 which is below  normal.   PLAN:  Discussed possible forms of regular exercise of his legs that will not aggravate arthritis pain of his knees.  I discussed in depth with the patient the nature of atherosclerosis, and emphasized the importance of maximal medical management including strict control of blood pressure, blood glucose, and lipid levels, obtaining regular exercise, and continued cessation of smoking.  The patient is aware that without maximal medical management the underlying atherosclerotic disease process will progress, limiting the benefit of any interventions.  Based on the patient's vascular studies and examination, pt will return to clinic in 1 year with ABI's and left LE arterial Duplex.  The patient was given information about PAD including signs, symptoms, treatment, what symptoms should prompt the patient to seek immediate medical care, and risk reduction measures to take.  Clemon Chambers, RN, MSN, FNP-C [facility] Phone: [phone]  Clinic MD: Kellie Simmering  12/22/2014 9:19 AM

## 2014-12-28 ENCOUNTER — Other Ambulatory Visit: Payer: Self-pay | Admitting: Internal Medicine

## 2015-01-12 ENCOUNTER — Other Ambulatory Visit: Payer: Self-pay | Admitting: Internal Medicine

## 2015-01-29 ENCOUNTER — Ambulatory Visit (INDEPENDENT_AMBULATORY_CARE_PROVIDER_SITE_OTHER): Payer: Medicare Other | Admitting: Internal Medicine

## 2015-01-29 ENCOUNTER — Encounter: Payer: Self-pay | Admitting: Internal Medicine

## 2015-01-29 VITALS — BP 126/78 | HR 66 | Ht 67.0 in | Wt 156.0 lb

## 2015-01-29 DIAGNOSIS — J432 Centrilobular emphysema: Secondary | ICD-10-CM

## 2015-01-29 NOTE — Progress Notes (Signed)
08/29/12- 69 yoM former smoker (60 pk yrs)referred courtesy of Dr Candelaria Celeste because of emphysema. Wife is here. Smoked 60 pack years prior to 72. He has been aware of dyspnea on exertion a diagnosis of emphysema for about 4 years. There has been gradual progression. Little cough except with colds. He is okay with activities of daily living and his breathing does not wake. He exercises regularly for his hip and leg with an exercycle. Up to date on flu and pneumonia vaccines He denies history of asthma or pneumonia but has had some seasonal allergic rhinitis. No heart disease or anemia. Family history is negative for lung disease. 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 CXR 01/13/10 IMPRESSION:  COPD/chronic changes.  Provider: Felipe Drone  10/04/12- 79 yoM former smoker (60 pk yrs)referred courtesy of Dr Candelaria Celeste because of emphysema. Wife is here. Smoked 60 pack years prior to 62. FOLLOWS FOR: review PFT and 6MW results with patient.  He reports no change since last here. He tries to walk on a treadmill and I emphasized the value of maintaining aerobic activity. He has enlarged prostate so we are avoiding Spiriva. CXR 09/11/12-IMPRESSION:  Stable COPD. No active disease.  Original Report Authenticated By: Lahoma Crocker, M.D. PFT- 10/04/2012-severe obstructive airways disease with insignificant response to bronchodilator, emphysema pattern. Air-trapping with increased residual volume. Diffusion moderately reduced. FVC 3.03/78%, FEV11.11/44%, FEV1/FVC 0.37. TLC 115%, RV 137%, DLCO 52%. 6MWT- 10/04/2012-92%, 83%, 91%, 336 m. Significant desaturation with exertion.  12/20/12-79 yoM former smoker (60 pk yrs)referred courtesy of Dr Candelaria Celeste because of emphysema. Wife is here. Smoked 60 pack years prior to 23. FOLLOWS FOR: Reports having a productive cough x2 weeks. Mucus is yellow. Has been more fatigued than normal and gets lightheaded from  time to time. Positive for chest tightness and SOB. Has used several OTC medications without relief.  12/30/12-79 yoM former smoker (60 pk yrs)referred courtesy of Dr Candelaria Celeste because of emphysema. Wife is here. Smoked 60 pack years prior to 7. FOLLOWS FOR: mostly dry cough-at times will be productive-ivory in color. Denies any SOB or wheezing.  Dry cough persists. Overnight oximetry to 10/09/12 documented need for oxygen during sleep. We discussed oxygen therapy. He asks about maintenance steroid therapy and we discussed this carefully. O2 2L sleep/ APS.  03/24/13- 45 yoM former smoker (60 pk yrs)r followed for COPD/ emphysema. Wife is here. Smoked 60 pack years prior to 19. FOLLOWS FXT:KWIOXB any SOB, wheezing or cough. Wife states patient has quite a bit of congestion (more in throat area) O2 2L sleep/ APS Note "gravelly " voice and throat congestion- speaking engagements pending. Scant white sputum, no infection. Felt better while on prednisone. Steroid talk done. Advair now 3rd tier.   06/23/13- 72 yoM former smoker (60 pk yrs)referred courtesy of Dr Candelaria Celeste because of emphysema. Wife is here. Smoked 60 pack years prior to 56. FOLLOWS FOR: Pt states he continues to have SOB(? worsened with activity). Wife thinks dyspnea on exertion may be better. Rare cough. No acute events. Anoro did not help.  10/22/13- 89 yoM former smoker (60 pk yrs)referred courtesy of Dr Candelaria Celeste because of emphysema. Wife is here. Smoked 60 pack years prior to 58. FOLLOWS FOR: Pt denies any SOB or wheezing. Pt states he continues to wear O2 at night but has to take it off at times due to dryness. They deny cough or wheeze. No acute change. Little exercise tolerance.  07/30/14- 63 yoM former smoker (60 pk yrs)referred courtesy of Dr Candelaria Celeste because of emphysema. Wife is here. Smoked 60 pack years prior to [place] YDX:AJOI increase in sob - Denies cough, wheeze or chest tightness - Had spinal  surgery 3 weeks ago Mild lightheadedness and may be some increased dyspnea on exertion but nothing specific  01/29/15- 64 yoM former smoker (60 pk yrs) followed for emphysema, complicated by BPH,    Smoked 60 pack years prior to 82       Wife is here FOLLOWS FOR: APS-O2 2L QHS-sleep-gets restless and comes off during the night. Pt states he continues to have SOB at times. BPH with bladder outlet obstruction symptoms-we avoid anticholinergics CXR 06/24/14 IMPRESSION: COPD and pleural parenchymal scarring. No acute cardiopulmonary disease. Electronically Signed  By: Marcello Moores Register  On: 06/24/2014 10:40  ROS-see HPI Constitutional:   No-   weight loss, night sweats, fevers, chills, fatigue, lassitude. HEENT:   No-  headaches, difficulty swallowing, tooth/dental problems, sore throat,       No-  sneezing, itching, ear ache, nasal congestion, post nasal drip,  CV:  No-   chest pain, orthopnea, PND, swelling in lower extremities, anasarca,  +dizziness, palpitations Resp: +  shortness of breath with exertion or at rest.             No- productive cough,  No non-productive cough,  No- coughing up of blood.              No-   change in color of mucus.  No- wheezing.   Skin: No-   rash or lesions. GI:  No-   heartburn, indigestion, abdominal pain, nausea, vomiting, GU: MS:  No-   joint pain or swelling. . Neuro-     nothing unusual Psych:  No- change in mood or affect. No depression or anxiety.  No memory loss.  OBJ- Physical Exam General- Alert, Oriented, Affect-appropriate, Distress- none acute, trim Skin- rash-none, lesions- none, excoriation- none, pale Lymphadenopathy- none Head- atraumatic            Eyes- Gross vision intact, PERRLA, conjunctivae and secretions clear            Ears- Hearing, canals-normal            Nose- Clear, no-Septal dev, mucus, polyps, erosion, perforation             Throat- Mallampati III , mucosa clear , drainage- none, tonsils- atrophic. No  thrush Neck- flexible , trachea midline, no stridor , thyroid nl, carotid no bruit Chest - symmetrical excursion , unlabored           Heart/CV- RRR , no murmur , no gallop  , no rub, nl s1 s2                           - JVD- none , edema- none, stasis changes- none, varices- none           Lung- +distant but clear to P&A, wheeze- none, cough-none , dullness-none, rub- none.            Chest wall-  Abd-  Br/ Gen/ Rectal- Not done, not indicated Extrem- cyanosis- none, clubbing, none, atrophy- none, strength- nl Neuro- grossly intact to observation

## 2015-01-29 NOTE — Patient Instructions (Addendum)
We can continue O2 2L / APS for sleep  We can continue present meds  Please ask Dr Renold Genta if you have had the Prevnar 13 pneumonia vaccine. I would recommend it, if you have not.  Please call if we can help

## 2015-01-31 NOTE — Assessment & Plan Note (Signed)
I think he and his wife are getting used to his diagnosis and he is learning to live with it, pacing himself. No acute exacerbations. He remains dependent on oxygen for sleep. We can't use anticholinergic meds because of his bladder retention

## 2015-01-31 NOTE — Assessment & Plan Note (Signed)
Avoiding anticholinergic respiratory medications

## 2015-02-09 ENCOUNTER — Other Ambulatory Visit: Payer: Self-pay | Admitting: Internal Medicine

## 2015-02-17 ENCOUNTER — Encounter: Payer: Self-pay | Admitting: Internal Medicine

## 2015-02-17 ENCOUNTER — Ambulatory Visit (INDEPENDENT_AMBULATORY_CARE_PROVIDER_SITE_OTHER): Payer: Medicare Other | Admitting: Internal Medicine

## 2015-02-17 VITALS — BP 130/64 | HR 77 | Temp 97.8°F

## 2015-02-17 DIAGNOSIS — Z23 Encounter for immunization: Secondary | ICD-10-CM | POA: Diagnosis not present

## 2015-02-17 NOTE — Progress Notes (Signed)
Patient presents today for Prevnar vaccine. Patient VS stable. Patient tolerated injection well.

## 2015-05-13 ENCOUNTER — Ambulatory Visit: Payer: Medicare Other | Admitting: Internal Medicine

## 2015-05-14 ENCOUNTER — Ambulatory Visit (INDEPENDENT_AMBULATORY_CARE_PROVIDER_SITE_OTHER): Payer: Medicare Other | Admitting: Internal Medicine

## 2015-05-14 DIAGNOSIS — Z23 Encounter for immunization: Secondary | ICD-10-CM | POA: Diagnosis not present

## 2015-05-16 ENCOUNTER — Telehealth: Payer: Self-pay | Admitting: Pulmonary Disease

## 2015-05-16 NOTE — Telephone Encounter (Signed)
Pt has severe COPD and now acute symptoms of ST and cough productive of clear to white mucus, increased in volume compared to normal for him. He has Z pak on hand and has used in past for similar situations. Asks for advice.   We discussed and agrred to go ahead and take Zpak.  He will not start prednisone unles he develops increased dyspnea requiring more freq use of his rescue inhaler   Merton Border, MD PCCM service Mobile [phone] Pager [phone]

## 2015-05-20 ENCOUNTER — Other Ambulatory Visit: Payer: Self-pay | Admitting: Internal Medicine

## 2015-05-21 ENCOUNTER — Telehealth: Payer: Self-pay | Admitting: Internal Medicine

## 2015-05-21 MED ORDER — AZITHROMYCIN 250 MG PO TABS: ORAL_TABLET | ORAL | Status: DC

## 2015-05-21 NOTE — Telephone Encounter (Signed)
Spoke with pt's wife.  She states that pt has 2 days left of Zpak.  PT still c/o sorethroat at night, chest congestion, prod cough at night (yellow), runny nose and sneezing.  Denies fever.  Pt not sure if he will need another rx for Zpak once this is finished since still having symptoms.    Please advise.  Allergies  Allergen Reactions  . Other     ANTIHISTAMINES---Enlarged Prostate    Current Outpatient Prescriptions on File Prior to Visit  Medication Sig Dispense Refill  . ADVAIR DISKUS 250-50 MCG/DOSE AEPB INHALE 1 PUFF BY MOUTH TWICE DAILY. RINSE MOUTH AFTER USE 60 each 5  . albuterol (PROAIR HFA) 108 (90 BASE) MCG/ACT inhaler Inhale 1-2 puffs into the lungs every 4 (four) hours as needed. as needed shortness of breath prior to exercise 1 Inhaler 11  . amLODipine (NORVASC) 10 MG tablet TAKE 1 TABLET BY MOUTH DAILY 90 tablet 3  . atorvastatin (LIPITOR) 20 MG tablet TAKE 1 TABLET BY MOUTH DAILY AT 6 PM 90 tablet 3  . CALCIPOTRIENE EX Apply 1 application topically 2 (two) times daily.     . Cholecalciferol (VITAMIN D) 2000 UNITS CAPS Take 1 capsule by mouth daily.    . cyanocobalamin 1000 MCG tablet Take 5,000 mcg by mouth daily.     . finasteride (PROSCAR) 5 MG tablet TAKE 1 TABLET BY MOUTH DAILY 90 tablet 3  . hydrochlorothiazide (HYDRODIURIL) 25 MG tablet TAKE 1 TABLET BY MOUTH EVERY MORNING 90 tablet 1  . mometasone (ELOCON) 0.1 % lotion Apply topically 2 (two) times daily as needed. (Patient taking differently: Apply 1 application topically 2 (two) times daily as needed (dermatosis). ) 60 mL 6  . Multiple Vitamins-Minerals (ICAPS) CAPS Take 1 capsule by mouth daily.    . Omega-3 Fatty Acids (FISH OIL PO) Take 4,000 Units by mouth daily.    Marland Kitchen oxyCODONE (OXY IR/ROXICODONE) 5 MG immediate release tablet Take 1 tablet (5 mg total) by mouth every 12 (twelve) hours as needed for severe pain. 60 tablet 0  . potassium chloride (K-DUR) 10 MEQ tablet Take 1 tablet (10 mEq total) by mouth 4 (four)  times daily. (Patient taking differently: Take 20 mEq by mouth 2 (two) times daily. ) 120 tablet 2  . tamsulosin (FLOMAX) 0.4 MG CAPS capsule Take 1 capsule (0.4 mg total) by mouth daily. (Patient taking differently: Take 0.4 mg by mouth 2 (two) times daily. ) 90 capsule 3  . therapeutic multivitamin-minerals (THERAGRAN-M) tablet Take 1 tablet by mouth daily.       No current facility-administered medications on file prior to visit.

## 2015-05-21 NOTE — Telephone Encounter (Signed)
Spoke with pt's wife and advised of Dr Janee Morn recommendations.  Rx for Zpak sent.

## 2015-05-21 NOTE — Telephone Encounter (Signed)
Offer a refill of the Z pak to have available if he feels he needs longer Rx this weekend.

## 2015-05-26 ENCOUNTER — Telehealth: Payer: Self-pay | Admitting: *Deleted

## 2015-05-26 MED ORDER — BENZONATATE 200 MG PO CAPS: 200.0000 mg | ORAL_CAPSULE | Freq: Three times a day (TID) | ORAL | Status: DC | PRN

## 2015-05-26 MED ORDER — AZITHROMYCIN 250 MG PO TABS: ORAL_TABLET | ORAL | Status: DC

## 2015-05-26 NOTE — Telephone Encounter (Signed)
Patient wife requesting tessalon Pearles for patient cough. OK per Dr Renold Genta to order.Also order Zpak per Dr Renold Genta

## 2015-06-11 ENCOUNTER — Telehealth: Payer: Self-pay | Admitting: Internal Medicine

## 2015-06-11 NOTE — Telephone Encounter (Signed)
Verbal order by Dr. Renold Genta to call in inhaler with 11 refills.   Left message on refill line @ [facility] @ [place].   Left message for patient at his home that medication will be ready for pick up this afternoon.

## 2015-06-11 NOTE — Telephone Encounter (Signed)
Patient calling to request Albuterol Inhaler 108 (90 Base) mcg/act inhaler.  Take 1-2 puffs every 4 hours prn for SOB prior to exercise.  Patient @ [phone]  Pharmacy:  [facility] @ [place].    Thanks.

## 2015-07-09 ENCOUNTER — Other Ambulatory Visit: Payer: Self-pay | Admitting: Internal Medicine

## 2015-08-04 ENCOUNTER — Ambulatory Visit (INDEPENDENT_AMBULATORY_CARE_PROVIDER_SITE_OTHER)
Admission: RE | Admit: 2015-08-04 | Discharge: 2015-08-04 | Disposition: A | Discharge status: Home / Self Care | Payer: Medicare Other | Source: Ambulatory Visit | Attending: Internal Medicine | Admitting: Internal Medicine

## 2015-08-04 ENCOUNTER — Ambulatory Visit (INDEPENDENT_AMBULATORY_CARE_PROVIDER_SITE_OTHER): Payer: Medicare Other | Admitting: Internal Medicine

## 2015-08-04 ENCOUNTER — Encounter: Payer: Self-pay | Admitting: Internal Medicine

## 2015-08-04 VITALS — BP 124/70 | HR 70 | Ht 67.0 in | Wt 156.8 lb

## 2015-08-04 DIAGNOSIS — J449 Chronic obstructive pulmonary disease, unspecified: Secondary | ICD-10-CM | POA: Diagnosis not present

## 2015-08-04 DIAGNOSIS — J432 Centrilobular emphysema: Secondary | ICD-10-CM | POA: Diagnosis not present

## 2015-08-04 MED ORDER — PREDNISONE 10 MG PO TABS: ORAL_TABLET | ORAL | Status: DC

## 2015-08-04 MED ORDER — AZITHROMYCIN 250 MG PO TABS: ORAL_TABLET | ORAL | Status: DC

## 2015-08-04 NOTE — Progress Notes (Signed)
08/29/12- 69 yoM former smoker (60 pk yrs)referred courtesy of Dr Candelaria Celeste because of emphysema. Wife is here. Smoked 60 pack years prior to 72. He has been aware of dyspnea on exertion a diagnosis of emphysema for about 4 years. There has been gradual progression. Little cough except with colds. He is okay with activities of daily living and his breathing does not wake. He exercises regularly for his hip and leg with an exercycle. Up to date on flu and pneumonia vaccines He denies history of asthma or pneumonia but has had some seasonal allergic rhinitis. No heart disease or anemia. Family history is negative for lung disease. 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 CXR 01/13/10 IMPRESSION:  COPD/chronic changes.  Provider: Felipe Drone  10/04/12- 79 yoM former smoker (60 pk yrs)referred courtesy of Dr Candelaria Celeste because of emphysema. Wife is here. Smoked 60 pack years prior to 62. FOLLOWS FOR: review PFT and 6MW results with patient.  He reports no change since last here. He tries to walk on a treadmill and I emphasized the value of maintaining aerobic activity. He has enlarged prostate so we are avoiding Spiriva. CXR 09/11/12-IMPRESSION:  Stable COPD. No active disease.  Original Report Authenticated By: Lahoma Crocker, M.D. PFT- 10/04/2012-severe obstructive airways disease with insignificant response to bronchodilator, emphysema pattern. Air-trapping with increased residual volume. Diffusion moderately reduced. FVC 3.03/78%, FEV11.11/44%, FEV1/FVC 0.37. TLC 115%, RV 137%, DLCO 52%. 6MWT- 10/04/2012-92%, 83%, 91%, 336 m. Significant desaturation with exertion.  12/20/12-79 yoM former smoker (60 pk yrs)referred courtesy of Dr Candelaria Celeste because of emphysema. Wife is here. Smoked 60 pack years prior to 23. FOLLOWS FOR: Reports having a productive cough x2 weeks. Mucus is yellow. Has been more fatigued than normal and gets lightheaded from  time to time. Positive for chest tightness and SOB. Has used several OTC medications without relief.  12/30/12-79 yoM former smoker (60 pk yrs)referred courtesy of Dr Candelaria Celeste because of emphysema. Wife is here. Smoked 60 pack years prior to 7. FOLLOWS FOR: mostly dry cough-at times will be productive-ivory in color. Denies any SOB or wheezing.  Dry cough persists. Overnight oximetry to 10/09/12 documented need for oxygen during sleep. We discussed oxygen therapy. He asks about maintenance steroid therapy and we discussed this carefully. O2 2L sleep/ APS.  03/24/13- 45 yoM former smoker (60 pk yrs)r followed for COPD/ emphysema. Wife is here. Smoked 60 pack years prior to 19. FOLLOWS FXT:KWIOXB any SOB, wheezing or cough. Wife states patient has quite a bit of congestion (more in throat area) O2 2L sleep/ APS Note "gravelly " voice and throat congestion- speaking engagements pending. Scant white sputum, no infection. Felt better while on prednisone. Steroid talk done. Advair now 3rd tier.   06/23/13- 72 yoM former smoker (60 pk yrs)referred courtesy of Dr Candelaria Celeste because of emphysema. Wife is here. Smoked 60 pack years prior to 56. FOLLOWS FOR: Pt states he continues to have SOB(? worsened with activity). Wife thinks dyspnea on exertion may be better. Rare cough. No acute events. Anoro did not help.  10/22/13- 89 yoM former smoker (60 pk yrs)referred courtesy of Dr Candelaria Celeste because of emphysema. Wife is here. Smoked 60 pack years prior to 58. FOLLOWS FOR: Pt denies any SOB or wheezing. Pt states he continues to wear O2 at night but has to take it off at times due to dryness. They deny cough or wheeze. No acute change. Little exercise tolerance.  07/30/14- 31 yoM former smoker (60 pk yrs)referred courtesy of Dr Candelaria Celeste because of emphysema. Wife is here. Smoked 60 pack years prior to [place] [id number] increase in sob - Denies cough, wheeze or chest tightness - Had spinal  surgery 3 weeks ago Mild lightheadedness and may be some increased dyspnea on exertion but nothing specific  01/29/15- 33 yoM former smoker (60 pk yrs) followed for emphysema, complicated by BPH,    Smoked 60 pack years prior to 83       Wife is here FOLLOWS FOR: APS-O2 2L QHS-sleep-gets restless and comes off during the night. Pt states he continues to have SOB at times. BPH with bladder outlet obstruction symptoms-we avoid anticholinergics CXR 06/24/14 IMPRESSION: COPD and pleural parenchymal scarring. No acute cardiopulmonary disease. Electronically Signed  By: Marcello Moores Register  On: 06/24/2014 10:40  08/04/2015-81 year old male former smoker (60 pack year) followed for emphysema, comp. By BPH Wife here FOLLOWS FOR: Pt denies any SOB, wheezing, cough, or congestion at this time. Doing well overall.  He is satisfied with his medications and denies any acute issues. They did use Z-Pak for early onset of acute bronchitis in the fall, successfully.  ROS-see HPI Constitutional:   No-   weight loss, night sweats, fevers, chills, fatigue, lassitude. HEENT:   No-  headaches, difficulty swallowing, tooth/dental problems, sore throat,       No-  sneezing, itching, ear ache, nasal congestion, post nasal drip,  CV:  No-   chest pain, orthopnea, PND, swelling in lower extremities, anasarca,  +dizziness, palpitations Resp: +  shortness of breath with exertion or at rest.             No- productive cough,  No non-productive cough,  No- coughing up of blood.              No-   change in color of mucus.  No- wheezing.   Skin: No-   rash or lesions. GI:  No-   heartburn, indigestion, abdominal pain, nausea, vomiting, GU: MS:  No-   joint pain or swelling. . Neuro-     nothing unusual Psych:  No- change in mood or affect. No depression or anxiety.  No memory loss.  OBJ- Physical Exam General- Alert, Oriented, Affect-appropriate, Distress- none acute, trim Skin- rash-none, lesions- none,  excoriation- none, pale Lymphadenopathy- none Head- atraumatic            Eyes- Gross vision intact, PERRLA, conjunctivae and secretions clear            Ears- Hearing, canals-normal            Nose- Clear, no-Septal dev, mucus, polyps, erosion, perforation             Throat- Mallampati III , mucosa clear , drainage- none, tonsils- atrophic. No thrush Neck- flexible , trachea midline, no stridor , thyroid nl, carotid no bruit Chest - symmetrical excursion , unlabored           Heart/CV- RRR , no murmur , no gallop  , no rub, nl s1 s2                           - JVD- none , edema- none, stasis changes- none, varices- none           Lung- +distant but clear to P&A, wheeze- none, cough-none , dullness-none, rub- none.            Chest wall-  Abd-  Br/ Gen/ Rectal- Not done, not indicated Extrem- cyanosis- none, clubbing, none, atrophy- none, strength- nl Neuro- grossly intact to observation

## 2015-08-04 NOTE — Patient Instructions (Signed)
Order CXR DX COPD mixed type  Scripts sent so you can hold a Zpak and prednisone taper this winter

## 2015-08-04 NOTE — Assessment & Plan Note (Signed)
Severe COPD, predominantly emphysema. At risk for occasional episodic bronchitis. Plan-refill Z-Pak and prednisone taper to keep on hand with discussion

## 2015-08-13 ENCOUNTER — Telehealth: Payer: Self-pay | Admitting: Internal Medicine

## 2015-08-13 NOTE — Telephone Encounter (Signed)
Called spoke with [name] (pt spouse). She wanted to inform us pt did start ZPAK last night. He has not started prednisone yet since he is not having any wheezing, no chest tx, no SOB. FYI for [place].

## 2015-08-14 ENCOUNTER — Other Ambulatory Visit: Payer: Self-pay | Admitting: Internal Medicine

## 2015-08-14 ENCOUNTER — Telehealth: Payer: Self-pay | Admitting: Internal Medicine

## 2015-08-14 MED ORDER — FLUTICASONE-SALMETEROL 250-50 MCG/DOSE IN AEPB: INHALATION_SPRAY | RESPIRATORY_TRACT | Status: DC

## 2015-08-14 NOTE — Telephone Encounter (Signed)
Requesting refill on advair 250 one bid > given x 6 m

## 2015-08-26 ENCOUNTER — Telehealth: Payer: Self-pay | Admitting: Internal Medicine

## 2015-08-26 NOTE — Telephone Encounter (Signed)
Could also add Mucinex-DM. Finish current Z pak and see how he does.

## 2015-08-26 NOTE — Telephone Encounter (Signed)
Spoke with pt's wife, states that pt finished zpak approx 2 weeks ago-was working well while pt was taking med, but still has a prod cough with white mucus. Denies sinus congestion, PND, fever.   Pt is taking another zpak given by Dr. Renold Genta (started this morning), tessalon perles.   Pt uses [facility] on [address].     CY please advise on further recs.  Thanks!   Allergies  Allergen Reactions  . Other     ANTIHISTAMINES---Enlarged Prostate   Current Outpatient Prescriptions on File Prior to Visit  Medication Sig Dispense Refill  . ADVAIR DISKUS 250-50 MCG/DOSE AEPB INHALE 1 PUFF BY MOUTH TWICE DAILY. RINSE MOUTH AFTER USE 60 each 2  . albuterol (PROAIR HFA) 108 (90 BASE) MCG/ACT inhaler Inhale 1-2 puffs into the lungs every 4 (four) hours as needed. as needed shortness of breath prior to exercise 1 Inhaler 11  . amLODipine (NORVASC) 10 MG tablet TAKE 1 TABLET BY MOUTH DAILY 90 tablet 3  . atorvastatin (LIPITOR) 20 MG tablet TAKE 1 TABLET BY MOUTH DAILY AT 6PM 90 tablet 3  . azithromycin (ZITHROMAX) 250 MG tablet Take 2 tablets today then 1 tablet daily until gone 6 tablet 0  . benzonatate (TESSALON) 200 MG capsule Take 1 capsule (200 mg total) by mouth 3 (three) times daily as needed for cough. 20 capsule 0  . CALCIPOTRIENE EX Apply 1 application topically 2 (two) times daily.     . Cholecalciferol (VITAMIN D) 2000 UNITS CAPS Take 1 capsule by mouth daily.    . cyanocobalamin 1000 MCG tablet Take 5,000 mcg by mouth daily.     . finasteride (PROSCAR) 5 MG tablet TAKE 1 TABLET BY MOUTH DAILY 90 tablet 3  . Fluticasone-Salmeterol (ADVAIR DISKUS) 250-50 MCG/DOSE AEPB One twice daily 60 each 5  . hydrochlorothiazide (HYDRODIURIL) 25 MG tablet TAKE 1 TABLET BY MOUTH EVERY MORNING 90 tablet 1  . mometasone (ELOCON) 0.1 % lotion Apply topically 2 (two) times daily as needed. (Patient taking differently: Apply 1 application topically 2 (two) times daily as needed (dermatosis). ) 60 mL  6  . Multiple Vitamins-Minerals (ICAPS) CAPS Take 1 capsule by mouth daily.    . Omega-3 Fatty Acids (FISH OIL PO) Take 4,000 Units by mouth daily.    . potassium chloride (K-DUR) 10 MEQ tablet Take 1 tablet (10 mEq total) by mouth 4 (four) times daily. (Patient taking differently: Take 20 mEq by mouth daily. ) 120 tablet 2  . predniSONE (DELTASONE) 10 MG tablet 4 X 2 DAYS, 3 X 2 DAYS, 2 X 2 DAYS, 1 X 2 DAYS 20 tablet 0  . tamsulosin (FLOMAX) 0.4 MG CAPS capsule Take 1 capsule (0.4 mg total) by mouth daily. (Patient taking differently: Take 0.4 mg by mouth 2 (two) times daily. ) 90 capsule 3  . therapeutic multivitamin-minerals (THERAGRAN-M) tablet Take 1 tablet by mouth daily.       No current facility-administered medications on file prior to visit.

## 2015-08-26 NOTE — Telephone Encounter (Signed)
Spoke with pt's spouse, aware of recs.  Nothing further needed at this time.  

## 2015-10-21 ENCOUNTER — Ambulatory Visit (INDEPENDENT_AMBULATORY_CARE_PROVIDER_SITE_OTHER): Payer: Medicare Other | Admitting: Sports Medicine

## 2015-10-21 ENCOUNTER — Encounter: Payer: Self-pay | Admitting: Sports Medicine

## 2015-10-21 VITALS — BP 144/69 | Ht 67.0 in | Wt 156.0 lb

## 2015-10-21 DIAGNOSIS — S46812A Strain of other muscles, fascia and tendons at shoulder and upper arm level, left arm, initial encounter: Secondary | ICD-10-CM | POA: Insufficient documentation

## 2015-10-21 NOTE — Assessment & Plan Note (Addendum)
Assessment: Left Trapezius Muscle Strain This may be triggered by DJD of cervical spine with neurapraxia based on exam  Plan: -Apply ice to left trapezius for 4-5 minutes at a time, 4-5 times per day -Apply Arnica to left neck and shoulder -Avoid push ups until strain has resolved -Do gentle range of motion exercises with arms, neck and shoulders -Muscle energy exercises  If not better in few weeks we need to do imaging and reassess -Return if no improvement in 2-3 weeks

## 2015-10-21 NOTE — Progress Notes (Signed)
Jeffrey Meadows - 82 y.o. male MRN [id number]  Date of birth: July 12, 1933  SUBJECTIVE:  Including CC & ROS.   Jeffrey Meadows is a 82 yo male presenting with complaint of left shoulder pain for the past one week. Patient states the day prior to the onset of pain he had been doing push-ups which is a semi-regular activity for him. He does not recall any other inciting event or injury to the left shoulder. The pain originates at the base of his left neck and extends to the left shoulder and into his proximal deltoid muscle. Pain is also present in the upper left back. Pain is aching in nature. It is present throughout the day but at a low level, barely noticeable. The pain is most severe at nighttime when he rolls over on the left shoulder. This pain can wake him up from sleep at night. He has not tried anything for the pain. He denies any prior history of injury or pain in his left shoulder. He denies weakness in his upper extremities or change in sensation.   General ROS: negative for - chills, fatigue, fever or chest pain, shortness of breath, nasal congestion or headache.  Admits to left shoulder pain, left neck pain. Denies weakness or chang in sensation.   HISTORY: Past Medical, Surgical, Social, and Family History Reviewed & Updated per EMR.   Pertinent Historical Findings include: Past Medical History  Diagnosis Date  . Villous adenoma of colon 11/.2001    2 cm removed cecum  . Adenomatous polyp of colon 08/2001    removed hepatic flexure  . Hyperlipidemia     takes Atorvastatin daily  . Raynaud disease   . Urinary frequency     takes Flomax daily  . Hypertension     takes Amlodipine and HCTZ daily  . Enlarged prostate     takes Proscar daily  . Emphysema lung ([place])   . History of bronchitis     about 3-55yrs ago   . Arthritis   . Joint pain   . Joint swelling   . Chronic back pain     stenosis/scoliosis/synovial cyst  . History of blood transfusion     no abnormal reaction noted    . Cataract    Past Surgical History  Procedure Laterality Date  . Prostate biopsy  4/95  . Total hip arthroplasty  1952    right  . Popliteal venous aneurysm repair Left 2010    about 6 yrs ago  . Joint replacement      Right Hip  . Hernia repair Left     inguinal  . Tonsillectomy    . Cataract surgery    . Laminectomy Right 07/02/2014    Procedure: Right Lumbar two-three Laminectomy for Synovial Cyst;  Surgeon: Erline Levine, MD;  Location: [facility];  Service: Neurosurgery;  Laterality: Right;  . Spine surgery  Jan. 2016    Dr. Vickie Epley    reports that he has quit smoking. His smoking use included Cigarettes. He has a 60 pack-year smoking history. He has never used smokeless tobacco. He reports that he drinks alcohol. His drug history is not on file. family history includes Aneurysm in his father; Heart disease in his mother; Heart failure in his mother. Allergies  Allergen Reactions  . Other     ANTIHISTAMINES---Enlarged Prostate   PHYSICAL EXAM:  VS: BP:(!) 144/69 mmHg  HR: bpm  TEMP: ( )  RESP:   HT:5\' 7"  (170.2 cm)  WT:156 lb (70.761 kg)  BMI:24.5 PHYSICAL EXAM: General:  Well developed, well nourished, and in no acute distress. Neuro:  Alert and oriented x3, extra-ocular muscles intact. Skin: Warm and dry, no rashes noted. Respiratory:  Not using accessory muscles, speaking in full sentences. Neck: Decreased ROM with sidebending to left and rotation to left. Positive left trapezius muscle spasticity with tender point throughout left trapezius from left neck to left shoulder. Right Shoulder: Inspection reveals no abnormalities, atrophy or asymmetry. Palpation is normal with no tenderness over AC joint or bicipital groove. ROM is full in all planes. Rotator cuff strength normal throughout. No signs of impingement with negative Neer and Hawkin's tests, empty can, and AC joint testing. Normal muscle strength with extension, flexion, internal and external  rotation.  No painful arc and no drop arm sign. Right Upper Extremity: Normal radial pulse. Normal sensation. Normal deltoid and bicep strength.    ASSESSMENT & PLAN: See problem based charting & AVS for pt instructions.  Assessment: Left Trapezius Muscle Strain  Plan: -Apply ice to left trapezius for 4-5 minutes at a time, 4-5 times per day -Apply Arnica to left neck and shoulder -Avoid push ups until strain has resolved -Do gentle range of motion exercises with arms, neck and shoulders -Muscle energy exercises -Return if no improvement in 2-3 weeks

## 2015-10-21 NOTE — Patient Instructions (Addendum)
Your Diagnosis: Left Trapezius Muscle Strain  Plan: -Freeze styrofoam cups with water and rub right neck to shoulder for 4-5 minutes at a time 4-5 times per day -Buy ARNICA (herbal over the counter medication) to rub on left neck and shoulder -Avoid push ups -Do gentle range of motion exercises with arms, neck and shoulders -Muscle energy exercises: lean head to the right, use left hand to push head to the right while you push your head to the left. Hold for 3-5 seconds, repeat 3 times. Repeat on the opposite side. -Return if no improvement in 2-3 weeks

## 2015-10-22 ENCOUNTER — Other Ambulatory Visit: Payer: Self-pay | Admitting: Internal Medicine

## 2015-10-22 ENCOUNTER — Other Ambulatory Visit: Payer: Medicare Other | Admitting: Internal Medicine

## 2015-10-22 DIAGNOSIS — I10 Essential (primary) hypertension: Secondary | ICD-10-CM

## 2015-10-22 DIAGNOSIS — Z0000 Encounter for general adult medical examination without abnormal findings: Secondary | ICD-10-CM

## 2015-10-22 DIAGNOSIS — N400 Enlarged prostate without lower urinary tract symptoms: Secondary | ICD-10-CM

## 2015-10-22 DIAGNOSIS — Z79899 Other long term (current) drug therapy: Secondary | ICD-10-CM

## 2015-10-22 DIAGNOSIS — E785 Hyperlipidemia, unspecified: Secondary | ICD-10-CM

## 2015-10-22 LAB — COMPLETE METABOLIC PANEL WITH GFR
ALT: 19 U/L (ref 9–46)
AST: 21 U/L (ref 10–35)
Albumin: 4 g/dL (ref 3.6–5.1)
Alkaline Phosphatase: 68 U/L (ref 40–115)
BUN: 10 mg/dL (ref 7–25)
CO2: 28 mmol/L (ref 20–31)
Calcium: 9.6 mg/dL (ref 8.6–10.3)
Chloride: 103 mmol/L (ref 98–110)
Creat: 0.82 mg/dL (ref 0.70–1.11)
GFR, Est African American: >89 mL/min (ref >=60)
GFR, Est Non African American: 84 mL/min (ref >=60)
Glucose, Bld: 83 mg/dL (ref 65–99)
Potassium: 3.9 mmol/L (ref 3.5–5.3)
Sodium: 144 mmol/L (ref 135–146)
Total Bilirubin: 0.5 mg/dL (ref 0.2–1.2)
Total Protein: 6.4 g/dL (ref 6.1–8.1)

## 2015-10-22 LAB — CBC WITH DIFFERENTIAL/PLATELET
Basophils Absolute: 0.1 10*3/uL (ref 0.0–0.1)
Basophils Relative: 1 % (ref 0–1)
Eosinophils Absolute: 0.1 10*3/uL (ref 0.0–0.7)
Eosinophils Relative: 1 % (ref 0–5)
HCT: 45.1 % (ref 39.0–52.0)
Hemoglobin: 15.6 g/dL (ref 13.0–17.0)
Lymphocytes Relative: 39 % (ref 12–46)
Lymphs Abs: 2.6 10*3/uL (ref 0.7–4.0)
MCH: 32 pg (ref 26.0–34.0)
MCHC: 34.6 g/dL (ref 30.0–36.0)
MCV: 92.6 fL (ref 78.0–100.0)
MPV: 9.8 fL (ref 8.6–12.4)
Monocytes Absolute: 0.5 10*3/uL (ref 0.1–1.0)
Monocytes Relative: 7 % (ref 3–12)
Neutro Abs: 3.4 10*3/uL (ref 1.7–7.7)
Neutrophils Relative %: 52 % (ref 43–77)
Platelets: 258 10*3/uL (ref 150–400)
RBC: 4.87 MIL/uL (ref 4.22–5.81)
RDW: 13.4 % (ref 11.5–15.5)
WBC: 6.6 10*3/uL (ref 4.0–10.5)

## 2015-10-22 LAB — LIPID PANEL
Cholesterol: 137 mg/dL (ref 125–200)
HDL: 66 mg/dL (ref >=40)
LDL Cholesterol: 47 mg/dL (ref <130)
Total CHOL/HDL Ratio: 2.1 Ratio (ref <=5.0)
Triglycerides: 120 mg/dL (ref <150)
VLDL: 24 mg/dL (ref <30)

## 2015-10-23 LAB — PSA: PSA: 1.8 ng/mL (ref <=4.00)

## 2015-10-25 ENCOUNTER — Encounter: Payer: Self-pay | Admitting: Internal Medicine

## 2015-10-25 ENCOUNTER — Ambulatory Visit (INDEPENDENT_AMBULATORY_CARE_PROVIDER_SITE_OTHER): Payer: Medicare Other | Admitting: Internal Medicine

## 2015-10-25 VITALS — BP 122/78 | HR 74 | Temp 97.2°F | Resp 20 | Ht 67.0 in | Wt 152.0 lb

## 2015-10-25 DIAGNOSIS — Z136 Encounter for screening for cardiovascular disorders: Secondary | ICD-10-CM | POA: Diagnosis not present

## 2015-10-25 DIAGNOSIS — Z720 Tobacco use: Secondary | ICD-10-CM

## 2015-10-25 DIAGNOSIS — Z0000 Encounter for general adult medical examination without abnormal findings: Secondary | ICD-10-CM | POA: Diagnosis not present

## 2015-10-25 DIAGNOSIS — Z87891 Personal history of nicotine dependence: Secondary | ICD-10-CM

## 2015-10-25 DIAGNOSIS — Z8709 Personal history of other diseases of the respiratory system: Secondary | ICD-10-CM

## 2015-10-25 DIAGNOSIS — S46812D Strain of other muscles, fascia and tendons at shoulder and upper arm level, left arm, subsequent encounter: Secondary | ICD-10-CM

## 2015-10-25 DIAGNOSIS — G8929 Other chronic pain: Secondary | ICD-10-CM

## 2015-10-25 DIAGNOSIS — Z8679 Personal history of other diseases of the circulatory system: Secondary | ICD-10-CM | POA: Diagnosis not present

## 2015-10-25 DIAGNOSIS — M25552 Pain in left hip: Secondary | ICD-10-CM

## 2015-10-25 DIAGNOSIS — I10 Essential (primary) hypertension: Secondary | ICD-10-CM

## 2015-10-25 DIAGNOSIS — L409 Psoriasis, unspecified: Secondary | ICD-10-CM
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
## 2015-10-25 NOTE — Patient Instructions (Signed)
It was a pleasure to see you today. Continue same medications and return in one year. Vitamin D level pending. To have screening for abdominal aortic aneurysm in the near future.

## 2015-10-27 LAB — VITAMIN D 25 HYDROXY (VIT D DEFICIENCY, FRACTURES): Vit D, 25-Hydroxy: 53 ng/mL (ref 30–100)

## 2015-11-02 ENCOUNTER — Ambulatory Visit
Admission: RE | Admit: 2015-11-02 | Discharge: 2015-11-02 | Disposition: A | Discharge status: Home / Self Care | Payer: Medicare Other | Source: Ambulatory Visit | Attending: Internal Medicine | Admitting: Internal Medicine

## 2015-11-02 DIAGNOSIS — Z136 Encounter for screening for cardiovascular disorders: Secondary | ICD-10-CM

## 2015-11-09 ENCOUNTER — Other Ambulatory Visit: Payer: Self-pay | Admitting: Internal Medicine

## 2015-11-09 NOTE — Telephone Encounter (Signed)
Ok Z pak    2 today then one daily     Ref x 3

## 2015-11-09 NOTE — Telephone Encounter (Signed)
CY Please advise if okay to refill. Thanks.  

## 2015-11-14 ENCOUNTER — Other Ambulatory Visit: Payer: Self-pay | Admitting: Internal Medicine

## 2015-12-06 ENCOUNTER — Other Ambulatory Visit: Payer: Self-pay

## 2015-12-06 MED ORDER — POTASSIUM CHLORIDE ER 10 MEQ PO TBCR: 10.0000 meq | EXTENDED_RELEASE_TABLET | Freq: Once | ORAL | Status: DC

## 2015-12-06 NOTE — Telephone Encounter (Signed)
Patient wife contacted office stating patient is taking potassium gluconate. Per Dr. Renold Genta pt should be taking K-Dur 66meq once daily due to taking HCTZ. [name] notified. New prescription for K-Dur sent to pharmacy.

## 2015-12-22 ENCOUNTER — Other Ambulatory Visit: Payer: Self-pay | Admitting: Urology

## 2015-12-28 ENCOUNTER — Encounter (HOSPITAL_COMMUNITY): Payer: Self-pay

## 2015-12-28 ENCOUNTER — Encounter (HOSPITAL_COMMUNITY)
Admission: RE | Admit: 2015-12-28 | Discharge: 2015-12-28 | Disposition: A | Discharge status: Home / Self Care | Payer: Medicare Other | Source: Ambulatory Visit | Attending: Urology | Admitting: Urology

## 2015-12-28 ENCOUNTER — Ambulatory Visit (HOSPITAL_COMMUNITY): Payer: Medicare Other

## 2015-12-28 DIAGNOSIS — N210 Calculus in bladder: Secondary | ICD-10-CM | POA: Diagnosis not present

## 2015-12-28 DIAGNOSIS — Z01812 Encounter for preprocedural laboratory examination: Secondary | ICD-10-CM | POA: Insufficient documentation

## 2015-12-28 DIAGNOSIS — I10 Essential (primary) hypertension: Secondary | ICD-10-CM | POA: Insufficient documentation

## 2015-12-28 DIAGNOSIS — N138 Other obstructive and reflux uropathy: Secondary | ICD-10-CM | POA: Insufficient documentation

## 2015-12-28 DIAGNOSIS — N401 Enlarged prostate with lower urinary tract symptoms: Secondary | ICD-10-CM | POA: Diagnosis not present

## 2015-12-28 DIAGNOSIS — Z01818 Encounter for other preprocedural examination: Secondary | ICD-10-CM | POA: Insufficient documentation

## 2015-12-28 HISTORY — DX: Dependence on supplemental oxygen: Z99.81

## 2015-12-28 LAB — BASIC METABOLIC PANEL
Anion gap: 7 (ref 5–15)
BUN: 11 mg/dL (ref 6–20)
CO2: 30 mmol/L (ref 22–32)
Calcium: 10.2 mg/dL (ref 8.9–10.3)
Chloride: 103 mmol/L (ref 101–111)
Creatinine, Ser: 0.85 mg/dL (ref 0.61–1.24)
GFR calc Af Amer: >60 mL/min (ref >60)
GFR calc non Af Amer: >60 mL/min (ref >60)
Glucose, Bld: 90 mg/dL (ref 65–99)
Potassium: 3.6 mmol/L (ref 3.5–5.1)
Sodium: 140 mmol/L (ref 135–145)

## 2015-12-28 LAB — CBC
HCT: 43.6 % (ref 39.0–52.0)
Hemoglobin: 15.1 g/dL (ref 13.0–17.0)
MCH: 32 pg (ref 26.0–34.0)
MCHC: 34.6 g/dL (ref 30.0–36.0)
MCV: 92.4 fL (ref 78.0–100.0)
Platelets: 303 10*3/uL (ref 150–400)
RBC: 4.72 MIL/uL (ref 4.22–5.81)
RDW: 12.1 % (ref 11.5–15.5)
WBC: 10.1 10*3/uL (ref 4.0–10.5)

## 2015-12-28 NOTE — Patient Instructions (Signed)
Jeffrey Meadows  12/28/2015   Your procedure is scheduled on: Tuesday 01/11/2016  Report to [facility]  Entrance take [place]  elevators to 3rd floor to  [place] at   [place] AM.  Call this number if you have problems the morning of surgery [phone]   Remember: ONLY 1 PERSON MAY GO WITH YOU TO SHORT STAY TO GET  READY MORNING OF [place].   Do not eat food or drink liquids :After Midnight.     Take these medicines the morning of surgery with A SIP OF WATER: Amlodipine, use Albuterol inhaler if needed and bring it with you to hospital                                You may not have any metal on your body including hair pins and              piercings  Do not wear jewelry, make-up, lotions, powders or perfumes, deodorant             Do not wear nail polish.  Do not shave  48 hours prior to surgery.              Men may shave face and neck.   Do not bring valuables to the hospital. [place].  Contacts, dentures or bridgework may not be worn into surgery.  Leave suitcase in the car. After surgery it may be brought to your room.                  Please read over the following fact sheets you were given: _____________________________________________________________________             [facility] - Preparing for Surgery Before surgery, you can play an important role.  Because skin is not sterile, your skin needs to be as free of germs as possible.  You can reduce the number of germs on your skin by washing with CHG (chlorahexidine gluconate) soap before surgery.  CHG is an antiseptic cleaner which kills germs and bonds with the skin to continue killing germs even after washing. Please DO NOT use if you have an allergy to CHG or antibacterial soaps.  If your skin becomes reddened/irritated stop using the CHG and inform your nurse when you arrive at Short Stay. Do not shave (including legs and  underarms) for at least 48 hours prior to the first CHG shower.  You may shave your face/neck. Please follow these instructions carefully:  1.  Shower with CHG Soap the night before surgery and the  morning of Surgery.  2.  If you choose to wash your hair, wash your hair first as usual with your  normal  shampoo.  3.  After you shampoo, rinse your hair and body thoroughly to remove the  shampoo.                           4.  Use CHG as you would any other liquid soap.  You can apply chg directly  to the skin and wash  Gently with a scrungie or clean washcloth.  5.  Apply the CHG Soap to your body ONLY FROM THE NECK DOWN.   Do not use on face/ open                           Wound or open sores. Avoid contact with eyes, ears mouth and genitals (private parts).                       Wash face,  Genitals (private parts) with your normal soap.             6.  Wash thoroughly, paying special attention to the area where your surgery  will be performed.  7.  Thoroughly rinse your body with warm water from the neck down.  8.  DO NOT shower/wash with your normal soap after using and rinsing off  the CHG Soap.                9.  Pat yourself dry with a clean towel.            10.  Wear clean pajamas.            11.  Place clean sheets on your bed the night of your first shower and do not  sleep with pets. Day of Surgery : Do not apply any lotions/deodorants the morning of surgery.  Please wear clean clothes to the hospital/surgery center.  FAILURE TO FOLLOW THESE INSTRUCTIONS MAY RESULT IN THE CANCELLATION OF YOUR SURGERY PATIENT SIGNATURE_________________________________  NURSE SIGNATURE__________________________________  ________________________________________________________________________

## 2015-12-31 ENCOUNTER — Other Ambulatory Visit: Payer: Self-pay | Admitting: Internal Medicine

## 2016-01-04 ENCOUNTER — Ambulatory Visit: Payer: Medicare Other | Admitting: Family

## 2016-01-10 NOTE — Anesthesia Preprocedure Evaluation (Addendum)
Anesthesia Evaluation  Patient identified by MRN, date of birth, ID band Patient awake    Reviewed: Allergy & Precautions, H&P , NPO status , Patient's Chart, lab work & pertinent test results  History of Anesthesia Complications Negative for: history of anesthetic complications  Airway Mallampati: II  TM Distance: >3 FB Neck ROM: full    Dental no notable dental hx.    Pulmonary COPD,  COPD inhaler, former smoker,  Significant smoking history in past  He sees pulmonology regularly and takes his maintenance inhalers regularly, occasionally is on 1L O2 at night.. His pulmonary status is baseline and he did very well with spine surgery 2 years ago with no major changes noted in his history   Pulmonary exam normal breath sounds clear to auscultation       Cardiovascular hypertension, Pt. on medications + Peripheral Vascular Disease  Normal cardiovascular exam Rhythm:regular Rate:Normal  No history of CAD, MI or heart failure, denies symptoms of heart failure   Neuro/Psych negative neurological ROS     GI/Hepatic negative GI ROS, Neg liver ROS,   Endo/Other  negative endocrine ROS  Renal/GU negative Renal ROS     Musculoskeletal  (+) Arthritis ,   Abdominal   Peds  Hematology negative hematology ROS (+)   Anesthesia Other Findings   Reproductive/Obstetrics negative OB ROS                            Anesthesia Physical Anesthesia Plan  ASA: III  Anesthesia Plan: General   Post-op Pain Management:    Induction: Intravenous  Airway Management Planned: LMA  Additional Equipment:   Intra-op Plan:   Post-operative Plan:   Informed Consent: I have reviewed the patients History and Physical, chart, labs and discussed the procedure including the risks, benefits and alternatives for the proposed anesthesia with the patient or authorized representative who has indicated his/her  understanding and acceptance.   Dental Advisory Given  Plan Discussed with: Anesthesiologist, CRNA and Surgeon  Anesthesia Plan Comments:         Anesthesia Quick Evaluation

## 2016-01-10 NOTE — H&P (Signed)
Chief Complaint difficulty voiding.   Active Problems Problems  1. Benign prostatic hyperplasia with urinary obstruction (N40.1,N13.8) 2. Benign prostatic hypertrophy without lower urinary tract symptoms (N40.0) 3. Elevated prostate specific antigen (PSA) (R97.20) 4. Pyuria (N39.0) 5. Weak urinary stream (R39.12)  History of Present Illness Jeffrey Meadows is a 82 yo WM patient of Dr. Junious Silk who presents today with the onset Friday of frequent small voids. He has BPH with BOO and is on tamsulosin and finasteride.  He has some urgency. He has no incontience. He has had no gross hematuria. He has some dysuria. He also has frequent bowel movements with no diarrhea. He has had some suprapubic discomfort. He has no history of stones. He has had no prior GU surgery. He has had no UTI's.   Past Medical History Problems  1. History of An Aneurysm Of The Popliteal Artery 2. History of hypercholesterolemia (Z86.39) 3. History of hypertension (Z86.79) 4. History of urinary tract infection (Z87.440)  Surgical History Problems  1. History of In-Situ Vein Bypass Popliteal-tibial 2. History of Laminectomy Decompressive Up To Two Lumbar Segments  Current Meds 1. Advair Diskus 500-50 MCG/DOSE Inhalation Aerosol Powder Breath Activated;  Therapy: 02Dec2013 to Recorded 2. AmLODIPine Besylate 10 MG Oral Tablet;  Therapy: [id number] to Recorded 3. Atorvastatin Calcium 20 MG Oral Tablet;  Therapy: [id number] to Recorded 4. Calcipotriene 0.005 % External Ointment;  Therapy: [id number] to Recorded 5. Finasteride 5 MG Oral Tablet; Take 1 tablet daily;  Therapy: [id number] to (Evaluate:19Aug2017)  Requested for: 24Aug2016; Last  Rx:24Aug2016 Ordered 6. HydroCHLOROthiazide TABS;  Therapy: (Recorded:13Oct2008) to Recorded 7. ICaps MV TABS;  Therapy: (Recorded:14Jun2012) to Recorded 8. Multi-Vitamin TABS;  Therapy: (Recorded:13Oct2008) to Recorded 9. Tamsulosin HCl - 0.4 MG Oral Capsule; TAKE 2 CAPSULES  ONCE A DAY;  Therapy: [id number] to (Evaluate:19Aug2017)  Requested for: 24Aug2016; Last  Rx:24Aug2016 Ordered 10. Vitamin D3 CAPS;   Therapy: (Recorded:14Jun2012) to Recorded  Allergies Medication  1. Antihistamine TABS  Family History Problems  1. Family history of Death In The Family Father 2. Family history of Death In The Family Mother 3. Family history of Family Health Status Number Of Children   1 son  Social History Problems  1. Alcohol Use (History)   3-5   glasses red wine a day 2. Caffeine Use   2-3 cups coffee a day 3. Marital History - Currently Married 4. 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 History of Tobacco Use   smoked  1 1/2 - 2 packs a day x 40 years.  He is a former smoker and chronically consumes 2-3 cups of coffee and 3-5 glasses of wine daily.   Review of Systems Genitourinary, constitutional, skin, eye, otolaryngeal, hematologic/lymphatic, cardiovascular, pulmonary, endocrine, musculoskeletal, gastrointestinal, neurological and psychiatric system(s) were reviewed and pertinent findings if present are noted and are otherwise negative.  Genitourinary: urinary frequency, feelings of urinary urgency, nocturia and suprapubic pain.  Constitutional: no fever.  Cardiovascular: no leg swelling.  Respiratory: shortness of breath.    Vitals Vital Signs [Data Includes: Last 1 Day]  Recorded: [id number] 10:55AM  Weight: 146 lb  BMI Calculated: 22.2 BSA Calculated: 1.79 Blood Pressure: 143 / 71 Temperature: 97.4 F Heart Rate: 79  Physical Exam Constitutional: Well nourished and well developed . No acute distress.  ENT:. The ears and nose are normal in appearance.  Neck: The appearance of the neck is normal and no neck mass is present.  Pulmonary: No respiratory distress and normal respiratory rhythm and effort.  Cardiovascular: Heart rate and  rhythm are normal . No peripheral edema.  Abdomen: The abdomen is flat. The abdomen is soft and nontender. No  masses are palpated. No CVA tenderness. No hernias are palpable.  A right inguinal hernia is present, which is reducible.  A left inguinal hernia is present, which is reducible. No hepatosplenomegaly noted.  Rectal: Rectal exam demonstrates normal sphincter tone, no tenderness and no masses. Estimated prostate size is 3+. The prostate has no nodularity and is not tender. The left seminal vesicle is nonpalpable. The right seminal vesicle is nonpalpable. The perineum is normal on inspection.  Genitourinary: Examination of the penis demonstrates hypospadias (penile ), but no discharge, no masses and no lesions. The scrotum is without lesions. The right epididymis is palpably normal and non-tender. The left epididymis is palpably normal and non-tender. The right testis is non-tender and without masses. The left testis is non-tender and without masses.  Lymphatics: The posterior cervical, supraclavicular, axillary, femoral and inguinal nodes are not enlarged or tender.  Skin: Normal skin turgor, no visible rash and no visible skin lesions.  Neuro/Psych:. Mood and affect are appropriate. Normal sensation of the perineum/perianal region (S3,4,5).    Results/Data Urine [Data Includes: Last 1 Day]   [id number]  COLOR YELLOW   APPEARANCE CLEAR   SPECIFIC GRAVITY 1.015   pH 8.0   GLUCOSE NEGATIVE   BILIRUBIN NEGATIVE   KETONE NEGATIVE   BLOOD 3+   PROTEIN TRACE   NITRITE NEGATIVE   LEUKOCYTE ESTERASE NEGATIVE   SQUAMOUS EPITHELIAL/HPF 0-5 HPF  WBC 0-5 WBC/HPF  RBC 20-40 RBC/HPF  BACTERIA NONE SEEN HPF  CRYSTALS See Below HPF  CASTS NONE SEEN LPF  Yeast NONE SEEN HPF   Old records or history reviewed: I have reviewed prior office notes and labs.  The following images/tracing/specimen were independently visualized:  CT films reviewed. He has 4 bladder stones up to 32mm in size and a large prostate with a middle lobe.  The following clinical lab reports were reviewed:  UA reviewed.  Will request  old records/history: I have reviewed recent labs and notes from [facility]. His PSA was 1.8 recently and he had a normal Cr in March.  PVR: Ultrasound PVR 113 ml.    Assessment Assessed  1. Asymptomatic microscopic hematuria (R31.21) 2. Increased urinary frequency (R35.0) 3. Benign prostatic hyperplasia with urinary obstruction (N40.1,N13.8)  He has BPH with BOO and multiple bladder stones.   Plan  Asymptomatic microscopic hematuria  1. AU CT-HEMATURIA PROTOCOL; Status:In Progress - Specimen/Data Collected;   Done:  [id number] 11:39AM 2. BUN & CREATININE; Status:Resulted - Requires Verification;   DoneHD:[id number] 11:21AM 3. VENIPUNCTURE; Status:Complete;   [id number] Benign prostatic hyperplasia with urinary obstruction  4. Follow-up Schedule Surgery Office  Follow-up  Status: Hold For - Appointment   Requested for: [id number] Health Maintenance  5. UA With REFLEX; [Do Not Release]; Status:Resulted - Requires Verification;   [id number] 10:46AM  I am going to get him set up for a cystolithalopaxy and TURP by Dr. Junious Silk and reviewed the risks of bleeding, infection, injury to the bladder and ureters, incontinence, strictures, thrombotic events and anesthetic complications.  Urine culture today but his UA doesn't look infected. .  I have asked him to cut out bladder irritants and use Azo for the irritative symptoms.     URINE CULTURE; Status:In Progress - Specimen/Data Collected;  Done: [id number]  Perform:[facility]; [id number]; Marked Important; Last Updated [id number], Jeffrey Meadows; 12/20/2015 11:38:58 AM;Ordered; Stat;   [id number] microscopic hematuria; Ordered [id number], Jeffrey Meadows;  Signatures Electronically signed by : Irine Seal, M.D.; Dec 20 2015 12:38PM EST  Add; I reviewed the patients chart, labs and imaging. I discussed with patient with Dr. Jeffie Pollock. Cx 80k mixed. 86 g prostate, median lobe.

## 2016-01-11 ENCOUNTER — Ambulatory Visit (HOSPITAL_COMMUNITY)
Admission: RE | Admit: 2016-01-11 | Discharge: 2016-01-11 | Disposition: A | Discharge status: Home / Self Care | Payer: Medicare Other | Source: Ambulatory Visit | Attending: Urology | Admitting: Urology

## 2016-01-11 ENCOUNTER — Ambulatory Visit (HOSPITAL_COMMUNITY): Payer: Medicare Other | Admitting: Anesthesiology

## 2016-01-11 ENCOUNTER — Encounter (HOSPITAL_COMMUNITY): Payer: Self-pay | Admitting: *Deleted

## 2016-01-11 ENCOUNTER — Encounter (HOSPITAL_COMMUNITY)
Admission: RE | Disposition: A | Discharge status: Home / Self Care | Payer: Self-pay | Source: Ambulatory Visit | Attending: Urology

## 2016-01-11 DIAGNOSIS — R350 Frequency of micturition: Secondary | ICD-10-CM | POA: Insufficient documentation

## 2016-01-11 DIAGNOSIS — R310 Gross hematuria: Secondary | ICD-10-CM | POA: Diagnosis not present

## 2016-01-11 DIAGNOSIS — N138 Other obstructive and reflux uropathy: Secondary | ICD-10-CM | POA: Diagnosis not present

## 2016-01-11 DIAGNOSIS — Q541 Hypospadias, penile: Secondary | ICD-10-CM | POA: Diagnosis not present

## 2016-01-11 DIAGNOSIS — M1990 Unspecified osteoarthritis, unspecified site: Secondary | ICD-10-CM | POA: Insufficient documentation

## 2016-01-11 DIAGNOSIS — Z87891 Personal history of nicotine dependence: Secondary | ICD-10-CM | POA: Diagnosis not present

## 2016-01-11 DIAGNOSIS — R3912 Poor urinary stream: Secondary | ICD-10-CM | POA: Diagnosis not present

## 2016-01-11 DIAGNOSIS — N210 Calculus in bladder: Secondary | ICD-10-CM | POA: Diagnosis not present

## 2016-01-11 DIAGNOSIS — I10 Essential (primary) hypertension: Secondary | ICD-10-CM | POA: Diagnosis not present

## 2016-01-11 DIAGNOSIS — I739 Peripheral vascular disease, unspecified: Secondary | ICD-10-CM | POA: Diagnosis not present

## 2016-01-11 DIAGNOSIS — N401 Enlarged prostate with lower urinary tract symptoms: Secondary | ICD-10-CM | POA: Diagnosis not present

## 2016-01-11 DIAGNOSIS — J449 Chronic obstructive pulmonary disease, unspecified: Secondary | ICD-10-CM | POA: Diagnosis not present

## 2016-01-11 DIAGNOSIS — R39198 Other difficulties with micturition: Secondary | ICD-10-CM | POA: Diagnosis present

## 2016-01-11 HISTORY — PX: HOLMIUM LASER APPLICATION: SHX5852

## 2016-01-11 HISTORY — PX: GREEN LIGHT LASER TURP (TRANSURETHRAL RESECTION OF PROSTATE: SHX6260

## 2016-01-11 HISTORY — PX: CYSTOSCOPY WITH LITHOLAPAXY: SHX1425

## 2016-01-11 SURGERY — CYSTOSCOPY, WITH BLADDER CALCULUS LITHOLAPAXY
Anesthesia: General

## 2016-01-11 MED ORDER — ONDANSETRON HCL 4 MG/2ML IJ SOLN
INTRAMUSCULAR | Status: AC
  Filled 2016-01-11: qty 2

## 2016-01-11 MED ORDER — LIDOCAINE HCL 1 % IJ SOLN
INTRAMUSCULAR | Status: DC | PRN
  Administered 2016-01-11: 100 mg via INTRADERMAL

## 2016-01-11 MED ORDER — BELLADONNA ALKALOIDS-OPIUM 16.2-60 MG RE SUPP
RECTAL | Status: AC
  Filled 2016-01-11: qty 1

## 2016-01-11 MED ORDER — ONDANSETRON HCL 4 MG/2ML IJ SOLN
INTRAMUSCULAR | Status: DC | PRN
  Administered 2016-01-11: 4 mg via INTRAVENOUS

## 2016-01-11 MED ORDER — LIDOCAINE HCL 2 % EX GEL
CUTANEOUS | Status: AC
  Filled 2016-01-11: qty 5

## 2016-01-11 MED ORDER — BELLADONNA ALKALOIDS-OPIUM 16.2-60 MG RE SUPP
RECTAL | Status: DC | PRN
  Administered 2016-01-11: 1 via RECTAL

## 2016-01-11 MED ORDER — STERILE WATER FOR IRRIGATION IR SOLN
Status: DC | PRN
  Administered 2016-01-11: 500 mL

## 2016-01-11 MED ORDER — CEFAZOLIN SODIUM-DEXTROSE 2-4 GM/100ML-% IV SOLN
INTRAVENOUS | Status: AC
  Filled 2016-01-11: qty 100

## 2016-01-11 MED ORDER — NITROFURANTOIN MONOHYD MACRO 100 MG PO CAPS: 100.0000 mg | ORAL_CAPSULE | Freq: Every day | ORAL | Status: DC

## 2016-01-11 MED ORDER — PHENYLEPHRINE 40 MCG/ML (10ML) SYRINGE FOR IV PUSH (FOR BLOOD PRESSURE SUPPORT)
PREFILLED_SYRINGE | INTRAVENOUS | Status: AC
  Filled 2016-01-11: qty 10

## 2016-01-11 MED ORDER — MIDAZOLAM HCL 5 MG/5ML IJ SOLN
INTRAMUSCULAR | Status: DC | PRN
  Administered 2016-01-11 (×2): 1 mg via INTRAVENOUS

## 2016-01-11 MED ORDER — ONDANSETRON HCL 4 MG/2ML IJ SOLN: 4.0000 mg | Freq: Once | INTRAMUSCULAR | Status: DC | PRN

## 2016-01-11 MED ORDER — MIDAZOLAM HCL 2 MG/2ML IJ SOLN
INTRAMUSCULAR | Status: AC
  Filled 2016-01-11: qty 2

## 2016-01-11 MED ORDER — FENTANYL CITRATE (PF) 250 MCG/5ML IJ SOLN
INTRAMUSCULAR | Status: AC
  Filled 2016-01-11: qty 5

## 2016-01-11 MED ORDER — FENTANYL CITRATE (PF) 100 MCG/2ML IJ SOLN: 25.0000 ug | INTRAMUSCULAR | Status: DC | PRN

## 2016-01-11 MED ORDER — FENTANYL CITRATE (PF) 100 MCG/2ML IJ SOLN
INTRAMUSCULAR | Status: DC | PRN
  Administered 2016-01-11 (×2): 25 ug via INTRAVENOUS
  Administered 2016-01-11 (×2): 50 ug via INTRAVENOUS
  Administered 2016-01-11 (×4): 25 ug via INTRAVENOUS

## 2016-01-11 MED ORDER — SODIUM CHLORIDE 0.9 % IR SOLN
Status: DC | PRN
  Administered 2016-01-11: 15000 mL

## 2016-01-11 MED ORDER — LIDOCAINE HCL (CARDIAC) 20 MG/ML IV SOLN
INTRAVENOUS | Status: AC
  Filled 2016-01-11: qty 5

## 2016-01-11 MED ORDER — SODIUM CHLORIDE 0.9 % IR SOLN
Status: DC | PRN
  Administered 2016-01-11: 2000 mL

## 2016-01-11 MED ORDER — LACTATED RINGERS IV SOLN
INTRAVENOUS | Status: DC | PRN
  Administered 2016-01-11: 07:00:00 via INTRAVENOUS
  Administered 2016-01-11: 1000 mL

## 2016-01-11 MED ORDER — ISOPROPYL ALCOHOL 70 % SOLN
Status: AC
  Filled 2016-01-11: qty 480

## 2016-01-11 MED ORDER — CEFAZOLIN SODIUM-DEXTROSE 2-4 GM/100ML-% IV SOLN
2.0000 g | INTRAVENOUS | Status: AC
  Administered 2016-01-11: 2 g via INTRAVENOUS
  Filled 2016-01-11: qty 100

## 2016-01-11 MED ORDER — 0.9 % SODIUM CHLORIDE (POUR BTL) OPTIME
TOPICAL | Status: DC | PRN
  Administered 2016-01-11: 1000 mL

## 2016-01-11 MED ORDER — TRAMADOL HCL 50 MG PO TABS: 50.0000 mg | ORAL_TABLET | Freq: Four times a day (QID) | ORAL | Status: DC | PRN

## 2016-01-11 MED ORDER — SODIUM CHLORIDE 0.9 % IJ SOLN
INTRAMUSCULAR | Status: AC
  Filled 2016-01-11: qty 10

## 2016-01-11 MED ORDER — PROPOFOL 10 MG/ML IV BOLUS
INTRAVENOUS | Status: AC
  Filled 2016-01-11: qty 20

## 2016-01-11 MED ORDER — PROPOFOL 10 MG/ML IV BOLUS
INTRAVENOUS | Status: DC | PRN
  Administered 2016-01-11: 80 mg via INTRAVENOUS
  Administered 2016-01-11: 120 mg via INTRAVENOUS

## 2016-01-11 MED ORDER — HYDROCODONE-ACETAMINOPHEN 7.5-325 MG PO TABS: 1.0000 | ORAL_TABLET | Freq: Once | ORAL | Status: DC | PRN

## 2016-01-11 SURGICAL SUPPLY — 17 items
BAG URINE DRAINAGE (UROLOGICAL SUPPLIES) ×2 IMPLANT
BAG URO CATCHER STRL LF (MISCELLANEOUS) ×2 IMPLANT
CATH TIEMANN FOLEY 18FR 5CC (CATHETERS) ×1 IMPLANT
CLOTH BEACON ORANGE TIMEOUT ST (SAFETY) ×2 IMPLANT
FEE RENTAL LASER GREENLIGHT (Laser) ×1 IMPLANT
FIBER LASER FLEXIVA 1000 (UROLOGICAL SUPPLIES) ×1 IMPLANT
GLOVE BIOGEL M STRL SZ7.5 (GLOVE) ×2 IMPLANT
GOWN STRL REUS W/TWL LRG LVL3 (GOWN DISPOSABLE) ×2 IMPLANT
GOWN STRL REUS W/TWL XL LVL3 (GOWN DISPOSABLE) ×2 IMPLANT
HOLDER FOLEY CATH W/STRAP (MISCELLANEOUS) ×1 IMPLANT
LASER FIBER /GREENLIGHT LASER (Laser) ×2 IMPLANT
LASER GREENLIGHT RENTAL P/PROC (Laser) ×2 IMPLANT
MANIFOLD NEPTUNE II (INSTRUMENTS) ×2 IMPLANT
PACK CYSTO (CUSTOM PROCEDURE TRAY) ×2 IMPLANT
SYR 30ML LL (SYRINGE) ×1 IMPLANT
SYRINGE IRR TOOMEY STRL 70CC (SYRINGE) ×1 IMPLANT
TUBING CONNECTING 10 (TUBING) ×2 IMPLANT

## 2016-01-11 NOTE — OR Nursing (Signed)
Stone taken by Dr. Eskridge ?

## 2016-01-11 NOTE — Op Note (Signed)
Preoperative diagnosis: BPH, gross hematuria, bladder stones Postoperative diagnosis: Same  Procedure: Cystoscopy litholapaxy > 4 cm, Greenlight photo vaporization of prostate  Surgeon: Junious Silk  Anesthesia: Gen.  Indication for procedure: 82 year old with BPH, lower urinary tract symptoms on tamsulosin and finasteride. He developed gross hematuria and evaluation revealed a prominent median lobe of the prostate as well for bladder stones a little over a centimeter each.  Findings: On exam under anesthesia there was a mid penile hypospadias. He may of had a prior repair with a lower abdominal scar where a suprapubic tube was likely located. The glans and the penis were normal without mass or lesion. The testicles were descended bilaterally and palpably normal. On digital rectal exam the prostate was mildly enlarged but smooth without hard area or nodule. Landmarks were preserved.  On cystoscopy the urethra was normal, there was a prominent median lobe and a high bladder neck. The bladder was large capacity. There was mild trabeculation. There were 4 large stones in the bladder. The trigone and ureteral orifices were in their normal orthotopic position with clear efflux. There bladder mucosa was unremarkable without tumor.   description of procedure: After consent was obtained patient brought to the operating room. After adequate anesthesia he is placed in lithotomy position. An exam under anesthesia was performed. He was prepped and draped in the usual sterile fashion. A timeout was performed to confirm the patient and procedure. The cystoscope was passed per urethra in the bladder inspected. The holmium laser fiber, 1000 , was passed and all the stones fragmented primarily it 1 and 10 and then it 0.8 and 8. All the fragments were then washed out of the bladder without difficulty.   I switched to the greenlight laser and reidentified the ureteral orifices. I made incisions at 5:00 and 7:00 down to  the prostatic capsule and brought these down to the veru. Then vaporized the median lobe. Energy initially at 80 and and brought up to 120. The lateral lobes were vaporized from the bladder neck down to the apex. There was no a significant lateral lobe component and not a lot of energy was utilized on the lateral lobes. The bladder was drained and hemostasis was excellent. Ureteral orifices were visualized again and noted to be normal. The bladder was filled and the scope removed. An 73 French coud catheter was placed. A 23 mL in the balloon and seated at at the bladder neck. Irrigation was clear. A B&O suppository was placed. He was awakened and taken to the recovery room in stable condition.   Complications: None   Blood loss: 10 mL   Laser statistics: Laser time approximally 15 minutes, energy approximally 75,000 J  Specimens: Bladder stones to office lab   Drains: 18 French coud catheter

## 2016-01-11 NOTE — Progress Notes (Signed)
Reviewed and demonstrated to wife and son how to empty and care for foley. Leg strap and standard drainage bag. Reassured wife we would be calling her on Wed. And she can let us know if she has any questions at that time.

## 2016-01-11 NOTE — Anesthesia Procedure Notes (Signed)
Procedure Name: LMA Insertion Date/Time: 01/11/2016 7:46 AM Performed by: Chyrel Masson Pre-anesthesia Checklist: Patient identified, Emergency Drugs available, Suction available and Patient being monitored Patient Re-evaluated:Patient Re-evaluated prior to inductionOxygen Delivery Method: Circle system utilized and Simple face mask Preoxygenation: Pre-oxygenation with 100% oxygen Intubation Type: IV induction Ventilation: Mask ventilation without difficulty LMA: LMA inserted and LMA with gastric port inserted LMA Size: 4.0 Number of attempts: 1 Placement Confirmation: positive ETCO2 and breath sounds checked- equal and bilateral

## 2016-01-11 NOTE — Anesthesia Postprocedure Evaluation (Signed)
Anesthesia Post Note  Patient: Jeffrey Meadows  Procedure(s) Performed: Procedure(s) (LRB): CYSTOSCOPY WITH LITHOLAPAXY (N/A) GREEN LIGHT LASER TURP (TRANSURETHRAL RESECTION OF PROSTATE (N/A) HOLMIUM LASER APPLICATION (N/A)  Patient location during evaluation: PACU Anesthesia Type: General Level of consciousness: awake and alert Pain management: pain level controlled Vital Signs Assessment: post-procedure vital signs reviewed and stable Respiratory status: spontaneous breathing, nonlabored ventilation, respiratory function stable and patient connected to nasal cannula oxygen Cardiovascular status: blood pressure returned to baseline and stable Postop Assessment: no signs of nausea or vomiting Anesthetic complications: no Comments: Doing well immediately post op from pulmonary standpoint    Last Vitals:  Filed Vitals:   01/11/16 0930 01/11/16 0945  BP: 135/81 134/68  Pulse: 87 69  Temp: 36.3 C   Resp: 10 13    Last Pain:  Filed Vitals:   01/11/16 0952  PainSc: 0-No pain                 Zenaida Deed

## 2016-01-11 NOTE — Interval H&P Note (Signed)
History and Physical Interval Note:  01/11/2016 7:30 AM  Jeffrey Meadows  has presented today for surgery, with the diagnosis of BPH WITH BLADDER OUTLET OBSTRUCTION AND BLADDER STONE  The various methods of treatment have been discussed with the patient and family. I discussed with the patient the nature, potential benefits, risks and alternatives to Procedure(s): CYSTOSCOPY WITH LITHOLAPAXY (N/A) GREEN LIGHT LASER TURP (TRANSURETHRAL RESECTION OF PROSTATE (N/A) as a surgical intervention,  including side effects of the proposed treatment, the likelihood of the patient achieving the goals of the procedure, and any potential problems that might occur during the procedure or recuperation. I discussed the major differences between TURP and Laser Photovaporization of the prostate. We discussed risks of outlet procedures such as stricture, bleeding and incontinence among others. We also discussed need for staged procedure which can be higher with laser. We discussed foley and post-op care. The patient's history has been reviewed, patient examined, no change in status, stable for surgery.  I have reviewed the patient's chart and labs.  Questions were answered to the patient's satisfaction.  He elects to proceed.    Stoney Karczewski

## 2016-01-11 NOTE — Discharge Instructions (Signed)
Foley Catheter Care, Adult A Foley catheter is a soft, flexible tube. This tube is placed into your bladder to drain pee (urine). If you go home with this catheter in place, follow the instructions below. TAKING CARE OF THE CATHETER  Wash your hands with soap and water.  Put soap and water on a clean washcloth.  Clean the skin where the tube goes into your body.  Clean away from the tube site.  Never wipe toward the tube.  Clean the area using a circular motion.  Remove all the soap. Pat the area dry with a clean towel. For males, reposition the skin that covers the end of the penis (foreskin).  Attach the tube to your leg with tape or a leg strap. Do not stretch the tube tight. If you are using tape, remove any stickiness left behind by past tape you used.  Keep the drainage bag below your hips. Keep it off the floor.  Check your tube during the day. Make sure it is working and draining. Make sure the tube does not curl, twist, or bend.  Do not pull on the tube or try to take it out. TAKING CARE OF THE DRAINAGE BAGS You will have a large overnight drainage bag and a small leg bag. You may wear the overnight bag any time. Never wear the small bag at night. Follow the directions below. Emptying the Drainage Bag Empty your drainage bag when it is  - full or at least 2-3 times a day.  Wash your hands with soap and water.  Keep the drainage bag below your hips.  Hold the dirty bag over the toilet or clean container.  Open the pour spout at the bottom of the bag. Empty the pee into the toilet or container. Do not let the pour spout touch anything.  Clean the pour spout with a gauze pad or cotton ball that has rubbing alcohol on it.  Close the pour spout.  Attach the bag to your leg with tape or a leg strap.  Wash your hands well. Changing the Drainage Bag Change your bag once a month or sooner if it starts to smell or look dirty.   Wash your hands with soap and  water.  Pinch the rubber tube so that pee does not spill out.  Disconnect the catheter tube from the drainage tube at the connection valve. Do not let the tubes touch anything.  Clean the end of the catheter tube with an alcohol wipe. Clean the end of a the drainage tube with a different alcohol wipe.  Connect the catheter tube to the drainage tube of the clean drainage bag.  Attach the new bag to the leg with tape or a leg strap. Avoid attaching the new bag too tightly.  Wash your hands well. Cleaning the Drainage Bag  Wash your hands with soap and water.  Wash the bag in warm, soapy water.  Rinse the bag with warm water.  Fill the bag with a mixture of white vinegar and water (1 cup vinegar to 1 quart warm water [.2 liter vinegar to 1 liter warm water]). Close the bag and soak it for 30 minutes in the solution.  Rinse the bag with warm water.  Hang the bag to dry with the pour spout open and hanging downward.  Store the clean bag (once it is dry) in a clean plastic bag.  Wash your hands well. PREVENT INFECTION  Wash your hands before and after touching your tube.  Take showers every day. Wash the skin where the tube enters your body. Do not take baths. Replace wet leg straps with dry ones, if this applies.  Do not use powders, sprays, or lotions on the genital area. Only use creams, lotions, or ointments as told by your doctor.  For females, wipe from front to back after going to the bathroom.  Drink enough fluids to keep your pee clear or pale yellow unless you are told not to have too much fluid (fluid restriction).  Do not let the drainage bag or tubing touch or lie on the floor.  Wear cotton underwear to keep the area dry. GET HELP IF:  Your pee is cloudy or smells unusually bad.  Your tube becomes clogged.  You are not draining pee into the bag or your bladder feels full.  Your tube starts to leak. GET HELP RIGHT AWAY IF:  You have pain, puffiness  (swelling), redness, or yellowish-white fluid (pus) where the tube enters the body.  You have pain in the belly (abdomen), legs, lower back, or bladder.  You have a fever.  You see blood fill the tube, or your pee is pink or red.  You feel sick to your stomach (nauseous), throw up (vomit), or have chills.  Your tube gets pulled out. MAKE SURE YOU:   Understand these instructions.  Will watch your condition.  Will get help right away if you are not doing well or get worse.   This information is not intended to replace advice given to you by your health care provider. Make sure you discuss any questions you have with your health care provider.     Prostate Laser Surgery, Care After Refer to this sheet in the next few weeks. These instructions provide you with information on caring for yourself after your procedure. Your health care provider may also give you more specific instructions. Your treatment has been planned according to current medical practices, but problems sometimes occur. Call your health care provider if you have any problems or questions after your procedure. WHAT TO EXPECT AFTER THE PROCEDURE  You may notice blood in your urine that could last up to 3 weeks.  After your catheter is removed, you will have burning (especially at the tip of your penis) when you urinate, especially at the end of urination. For the first few weeks after your procedure, you will feel the need to urinate often. HOME CARE INSTRUCTIONS   Do not perform vigorous exercise, especially heavy lifting, for 1 week or as directed by your health care provider.  Avoid sexual activity for 4-6 weeks or as directed by your health care provider.  Do not ride in a car for extended periods for 1 month or as directed by your health care provider.  Do not strain to have a bowel movement. Drink a lot of fluids and and make sure you get enough fiber in your diet.  Drink enough fluids to keep your urine  clear or pale yellow. SEEK IMMEDIATE MEDICAL CARE IF:   Your catheter has been removed and you are suddenly unable to urinate.  Your catheter has not been removed, and it develops a blockage.  You start to have blood clots in your urine.  The blood in your urine becomes persistent or gets thick.  Your temperature is greater than 100.8F (38.1C).  You develop chest pains.  You develop shortness of breath.  You develop leg swelling or pain. MAKE SURE YOU:  Understand these instructions.  Will watch your condition.  Will get help right away if you are not doing well or get worse.   This information is not intended to replace advice given to you by your health care provider. Make sure you discuss any questions you have with your health care provider.   Document Released: 07/31/2005 Document Revised: 08/05/2013 Document Reviewed: 01/20/2013 Elsevier Interactive Patient Education 2016 [place] Anesthesia, Adult, Care After Refer to this sheet in the next few weeks. These instructions provide you with information on caring for yourself after your procedure. Your health care provider may also give you more specific instructions. Your treatment has been planned according to current medical practices, but problems sometimes occur. Call your health care provider if you have any problems or questions after your procedure. WHAT TO EXPECT AFTER THE PROCEDURE After the procedure, it is typical to experience:  Sleepiness.  Nausea and vomiting. HOME CARE INSTRUCTIONS  For the first 24 hours after general anesthesia:  Have a responsible person with you.  Do not drive a car. If you are alone, do not take public transportation.  Do not drink alcohol.  Do not take medicine that has not been prescribed by your health care provider.  Do not sign important papers or make important decisions.  You may resume a normal diet and activities as directed by your health care  provider.  Change bandages (dressings) as directed.  If you have questions or problems that seem related to general anesthesia, call the hospital and ask for the anesthetist or anesthesiologist on call. SEEK MEDICAL CARE IF:  You have nausea and vomiting that continue the day after anesthesia.  You develop a rash. SEEK IMMEDIATE MEDICAL CARE IF:   You have difficulty breathing.  You have chest pain.  You have any allergic problems.   This information is not intended to replace advice given to you by your health care provider. Make sure you discuss any questions you have with your health care provider.   Document Released: 11/06/2000 Document Revised: 08/21/2014 Document Reviewed: 11/29/2011 Elsevier Interactive Patient Education [employer].

## 2016-01-11 NOTE — Transfer of Care (Signed)
Immediate Anesthesia Transfer of Care Note  Patient: Jeffrey Meadows  Procedure(s) Performed: Procedure(s): CYSTOSCOPY WITH LITHOLAPAXY (N/A) GREEN LIGHT LASER TURP (TRANSURETHRAL RESECTION OF PROSTATE (N/A) HOLMIUM LASER APPLICATION (N/A)  Patient Location: PACU  Anesthesia Type:General  Level of Consciousness: awake, alert  and patient cooperative  Airway & Oxygen Therapy: Patient Spontanous Breathing and Patient connected to face mask oxygen  Post-op Assessment: Report given to RN and Post -op Vital signs reviewed and stable  Post vital signs: Reviewed and stable  Last Vitals:  Filed Vitals:   01/11/16 0552  BP: 149/75  Pulse: 76  Temp: 36.5 C  Resp: 18    Last Pain: There were no vitals filed for this visit.       Complications: No apparent anesthesia complications

## 2016-01-11 NOTE — Progress Notes (Signed)
Jeffrey Meadows noted to have two small skin tears on Right arm.  He states they are cat scratches.  Small tegaderm placed over each skin tear.  No drainage noted.

## 2016-02-02 ENCOUNTER — Ambulatory Visit (INDEPENDENT_AMBULATORY_CARE_PROVIDER_SITE_OTHER): Payer: Medicare Other | Admitting: Internal Medicine

## 2016-02-02 ENCOUNTER — Encounter: Payer: Self-pay | Admitting: Internal Medicine

## 2016-02-02 VITALS — BP 122/66 | HR 71 | Ht 67.0 in | Wt 154.2 lb

## 2016-02-02 DIAGNOSIS — Z720 Tobacco use: Secondary | ICD-10-CM | POA: Diagnosis not present

## 2016-02-02 DIAGNOSIS — F17201 Nicotine dependence, unspecified, in remission: Secondary | ICD-10-CM

## 2016-02-02 DIAGNOSIS — J432 Centrilobular emphysema: Secondary | ICD-10-CM | POA: Diagnosis not present

## 2016-02-02 MED ORDER — TIOTROPIUM BROMIDE-OLODATEROL 2.5-2.5 MCG/ACT IN AERS: 2.0000 | INHALATION_SPRAY | Freq: Every day | RESPIRATORY_TRACT | Status: DC

## 2016-02-02 NOTE — Progress Notes (Signed)
08/29/12- 82 yoM former smoker (60 pk yrs)referred courtesy of Dr Candelaria Celeste because of emphysema. Wife is here. Smoked 60 pack years prior to 72. He has been aware of dyspnea on exertion a diagnosis of emphysema for about 4 years. There has been gradual progression. Little cough except with colds. He is okay with activities of daily living and his breathing does not wake. He exercises regularly for his hip and leg with an exercycle. Up to date on flu and pneumonia vaccines He denies history of asthma or pneumonia but has had some seasonal allergic rhinitis. No heart disease or anemia. Family history is negative for lung disease. 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 CXR 01/13/10 IMPRESSION:  COPD/chronic changes.  Provider: Felipe Drone  10/04/12- 82 yoM former smoker (60 pk yrs)referred courtesy of Dr Candelaria Celeste because of emphysema. Wife is here. Smoked 60 pack years prior to 62. FOLLOWS FOR: review PFT and 6MW results with patient.  He reports no change since last here. He tries to walk on a treadmill and I emphasized the value of maintaining aerobic activity. He has enlarged prostate so we are avoiding Spiriva. CXR 09/11/12-IMPRESSION:  Stable COPD. No active disease.  Original Report Authenticated By: Lahoma Crocker, M.D. PFT- 10/04/2012-severe obstructive airways disease with insignificant response to bronchodilator, emphysema pattern. Air-trapping with increased residual volume. Diffusion moderately reduced. FVC 3.03/78%, FEV11.11/44%, FEV1/FVC 0.37. TLC 115%, RV 137%, DLCO 52%. 6MWT- 10/04/2012-92%, 83%, 91%, 336 m. Significant desaturation with exertion.  12/20/12-82 yoM former smoker (60 pk yrs)referred courtesy of Dr Candelaria Celeste because of emphysema. Wife is here. Smoked 60 pack years prior to 23. FOLLOWS FOR: Reports having a productive cough x2 weeks. Mucus is yellow. Has been more fatigued than normal and gets lightheaded from  time to time. Positive for chest tightness and SOB. Has used several OTC medications without relief.  12/30/12-82 yoM former smoker (60 pk yrs)referred courtesy of Dr Candelaria Celeste because of emphysema. Wife is here. Smoked 60 pack years prior to 7. FOLLOWS FOR: mostly dry cough-at times will be productive-ivory in color. Denies any SOB or wheezing.  Dry cough persists. Overnight oximetry to 10/09/12 documented need for oxygen during sleep. We discussed oxygen therapy. He asks about maintenance steroid therapy and we discussed this carefully. O2 2L sleep/ APS.  03/24/13- 82 yoM former smoker (60 pk yrs)r followed for COPD/ emphysema. Wife is here. Smoked 60 pack years prior to 19. FOLLOWS FXT:KWIOXB any SOB, wheezing or cough. Wife states patient has quite a bit of congestion (more in throat area) O2 2L sleep/ APS Note "gravelly " voice and throat congestion- speaking engagements pending. Scant white sputum, no infection. Felt better while on prednisone. Steroid talk done. Advair now 3rd tier.   06/23/13- 82 yoM former smoker (60 pk yrs)referred courtesy of Dr Candelaria Celeste because of emphysema. Wife is here. Smoked 60 pack years prior to 56. FOLLOWS FOR: Pt states he continues to have SOB(? worsened with activity). Wife thinks dyspnea on exertion may be better. Rare cough. No acute events. Anoro did not help.  10/22/13- 82 yoM former smoker (60 pk yrs)referred courtesy of Dr Candelaria Celeste because of emphysema. Wife is here. Smoked 60 pack years prior to 58. FOLLOWS FOR: Pt denies any SOB or wheezing. Pt states he continues to wear O2 at night but has to take it off at times due to dryness. They deny cough or wheeze. No acute change. Little exercise tolerance.  07/30/14- 82 yoM former smoker (60 pk yrs)referred courtesy of Dr Candelaria Celeste because of emphysema. Wife is here. Smoked 60 pack years prior to [place] [id number] increase in sob - Denies cough, wheeze or chest tightness - Had spinal  surgery 3 weeks ago Mild lightheadedness and may be some increased dyspnea on exertion but nothing specific  01/29/15- 82 yoM former smoker (60 pk yrs) followed for emphysema, complicated by BPH,    Smoked 60 pack years prior to 24       Wife is here FOLLOWS FOR: APS-O2 2L QHS-sleep-gets restless and comes off during the night. Pt states he continues to have SOB at times. BPH with bladder outlet obstruction symptoms-we avoid anticholinergics CXR 06/24/14 IMPRESSION: COPD and pleural parenchymal scarring. No acute cardiopulmonary disease. Electronically Signed  By: Marcello Moores Register  On: 06/24/2014 10:40  08/04/2015-82 year old male former smoker (60 pack year) followed for emphysema, complicated by BPH Wife here FOLLOWS FOR: Pt denies any SOB, wheezing, cough, or congestion at this time. Doing well overall.  He is satisfied with his medications and denies any acute issues. They did use Z-Pak for early onset of acute bronchitis in the fall, successfully.  02/02/2016-82 year old male former smoker (60 pack years) followed for COPD/emphysema, complicated by BPH O2 2 L sleep/APS FOLLOWS FOR:Pt denies any SOB, wheezing, or congestion at this time. He and wife agree he has been stable over the past year without acute respiratory infections. No routine cough. CXR 08/04/2015- IMPRESSION: COPD and pleural parenchymal scarring. No acute cardiopulmonary disease. Electronically Signed  By: Marcello Moores Register  On: 08/04/2015 10:02  ROS-see HPI Constitutional:   No-   weight loss, night sweats, fevers, chills, fatigue, lassitude. HEENT:   No-  headaches, difficulty swallowing, tooth/dental problems, sore throat,       No-  sneezing, itching, ear ache, nasal congestion, post nasal drip,  CV:  No-   chest pain, orthopnea, PND, swelling in lower extremities, anasarca,  +dizziness, palpitations Resp: +  shortness of breath with exertion or at rest.             No- productive cough,  No  non-productive cough,  No- coughing up of blood.              No-   change in color of mucus.  No- wheezing.   Skin: No-   rash or lesions. GI:  No-   heartburn, indigestion, abdominal pain, nausea, vomiting, GU: MS:  No-   joint pain or swelling. . Neuro-     nothing unusual Psych:  No- change in mood or affect. No depression or anxiety.  No memory loss.  OBJ- Physical Exam General- Alert, Oriented, Affect-appropriate, Distress- none acute, trim Skin- rash-none, lesions- none, excoriation- none, pale Lymphadenopathy- none Head- atraumatic            Eyes- Gross vision intact, PERRLA, conjunctivae and secretions clear            Ears- Hearing, canals-normal            Nose- Clear, no-Septal dev, mucus, polyps, erosion, perforation             Throat- Mallampati III , mucosa clear , drainage- none, tonsils- atrophic. No thrush Neck- flexible , trachea midline, no stridor , thyroid nl, carotid no bruit Chest - symmetrical excursion , unlabored           Heart/CV- RRR , no murmur , no gallop  , no rub, nl s1 s2                           -  JVD- none , edema- none, stasis changes- none, varices- none           Lung- +distant but clear to P&A, wheeze- none, cough-none , dullness-none, rub- none.            Chest wall-  Abd-  Br/ Gen/ Rectal- Not done, not indicated Extrem- cyanosis- none, clubbing, none, atrophy- none, strength- nl Neuro- grossly intact to observation

## 2016-02-02 NOTE — Assessment & Plan Note (Signed)
His wife is supportive and I think he will be able to stay off

## 2016-02-02 NOTE — Patient Instructions (Addendum)
Sample and print script Stiolto Respimat maintenance inhaler     Inhale 2 puffs, once daily   Try this instead of Advair. When the sample runs out, return to Advair for comparison. If you prefer Stiolto then fill the script.   Please call as needed

## 2016-02-02 NOTE — Assessment & Plan Note (Signed)
Stable without recent exacerbation. He paces himself and avoids sustained exertion. Plan-try sample Stiolto LABA/LAMA instead of Advair LABA. ICS, for comparison

## 2016-02-02 NOTE — Progress Notes (Signed)
Patient ID: Jeffrey Meadows, male   DOB: [birth year], 82 y.o.   MRN: [id number]   Patient seen in the office today and instructed on use of [place].  Patient expressed understanding and demonstrated technique.

## 2016-02-12 ENCOUNTER — Other Ambulatory Visit: Payer: Self-pay | Admitting: Internal Medicine

## 2016-03-01 ENCOUNTER — Ambulatory Visit (INDEPENDENT_AMBULATORY_CARE_PROVIDER_SITE_OTHER): Payer: Medicare Other | Admitting: Sports Medicine

## 2016-03-01 ENCOUNTER — Encounter: Payer: Self-pay | Admitting: Sports Medicine

## 2016-03-01 VITALS — BP 136/64 | Ht 68.0 in | Wt 150.0 lb

## 2016-03-01 DIAGNOSIS — M79602 Pain in left arm: Secondary | ICD-10-CM

## 2016-03-01 DIAGNOSIS — M47812 Spondylosis without myelopathy or radiculopathy, cervical region: Secondary | ICD-10-CM | POA: Diagnosis not present

## 2016-03-01 NOTE — Assessment & Plan Note (Signed)
I started him on vitamin B6  Once we see the x-ray if he does have significant arthritis we will start him on some physical therapy followed by home exercises  I like to avoid medications if possible

## 2016-03-01 NOTE — Progress Notes (Signed)
Patient ID: Jeffrey Meadows, male   DOB: 08-Mar-1933, 82 y.o.   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 Chronic medical illnesses as noted in his problem list  Review of systems Some occasional neck pain No cough or sneeze pain No weakness in the right upper extremity  Physical exam Pleasant older male in no acute distress BP 136/64 mmHg  Ht 5\' 8"  (1.727 m)  Wt 150 lb (68.04 kg)  BMI 22.81 kg/m2  Head forward position by about 10 Limited rotation of his neck left and right Limited extension Full flexion Limited lateral bending left and right  Some pain with lateral bending to the left and with Spurling's pain down to the trapezius  Shoulder shows full range of motion All impingement signs are negative Rotator cuff strength is good  Grip strength in left hand is slightly decreased from the right Sensation seems good  Foot exam shows loss of the fat pad under the first MTP joint There is also a small spur on the plantar surface  Assessment Arthritic changes first MTP joint Note I placed a cut out pad and this seemed to relieve some of his pain with walking We will continue that

## 2016-03-02 ENCOUNTER — Ambulatory Visit (INDEPENDENT_AMBULATORY_CARE_PROVIDER_SITE_OTHER)
Admission: RE | Admit: 2016-03-02 | Discharge: 2016-03-02 | Disposition: A | Discharge status: Home / Self Care | Payer: Medicare Other | Source: Ambulatory Visit | Attending: Family | Admitting: Family

## 2016-03-02 ENCOUNTER — Encounter (INDEPENDENT_AMBULATORY_CARE_PROVIDER_SITE_OTHER): Payer: Medicare Other

## 2016-03-02 ENCOUNTER — Ambulatory Visit (HOSPITAL_COMMUNITY)
Admission: RE | Admit: 2016-03-02 | Discharge: 2016-03-02 | Disposition: A | Discharge status: Home / Self Care | Payer: Medicare Other | Source: Ambulatory Visit | Attending: Family | Admitting: Family

## 2016-03-02 ENCOUNTER — Encounter: Payer: Self-pay | Admitting: Family

## 2016-03-02 DIAGNOSIS — I868 Varicose veins of other specified sites: Secondary | ICD-10-CM

## 2016-03-02 DIAGNOSIS — I70201 Unspecified atherosclerosis of native arteries of extremities, right leg: Secondary | ICD-10-CM | POA: Diagnosis not present

## 2016-03-02 DIAGNOSIS — E785 Hyperlipidemia, unspecified: Secondary | ICD-10-CM | POA: Insufficient documentation

## 2016-03-02 DIAGNOSIS — Z95828 Presence of other vascular implants and grafts: Secondary | ICD-10-CM | POA: Diagnosis not present

## 2016-03-02 DIAGNOSIS — I739 Peripheral vascular disease, unspecified: Secondary | ICD-10-CM | POA: Diagnosis present

## 2016-03-02 DIAGNOSIS — Z87891 Personal history of nicotine dependence: Secondary | ICD-10-CM | POA: Diagnosis not present

## 2016-03-07 ENCOUNTER — Encounter: Payer: Self-pay | Admitting: Family

## 2016-03-07 ENCOUNTER — Ambulatory Visit (INDEPENDENT_AMBULATORY_CARE_PROVIDER_SITE_OTHER): Payer: Medicare Other | Admitting: Family

## 2016-03-07 VITALS — BP 153/86 | HR 71 | Temp 98.0°F | Ht 68.0 in | Wt 154.2 lb

## 2016-03-07 DIAGNOSIS — Z95828 Presence of other vascular implants and grafts: Secondary | ICD-10-CM

## 2016-03-07 DIAGNOSIS — Z87891 Personal history of nicotine dependence: Secondary | ICD-10-CM | POA: Diagnosis not present

## 2016-03-07 DIAGNOSIS — I779 Disorder of arteries and arterioles, unspecified: Secondary | ICD-10-CM

## 2016-03-07 NOTE — Patient Instructions (Signed)
Peripheral Vascular Disease Peripheral vascular disease (PVD) is a disease of the blood vessels that are not part of your heart and brain. A simple term for PVD is poor circulation. In most cases, PVD narrows the blood vessels that carry blood from your heart to the rest of your body. This can result in a decreased supply of blood to your arms, legs, and internal organs, like your stomach or kidneys. However, it most often affects a person's lower legs and feet. There are two types of PVD.  Organic PVD. This is the more common type. It is caused by damage to the structure of blood vessels.  Functional PVD. This is caused by conditions that make blood vessels contract and tighten (spasm). Without treatment, PVD tends to get worse over time. PVD can also lead to acute ischemic limb. This is when an arm or limb suddenly has trouble getting enough blood. This is a medical emergency. CAUSES Each type of PVD has many different causes. The most common cause of PVD is buildup of a fatty material (plaque) inside of your arteries (atherosclerosis). Small amounts of plaque can break off from the walls of the blood vessels and become lodged in a smaller artery. This blocks blood flow and can cause acute ischemic limb. Other common causes of PVD include:  Blood clots that form inside of blood vessels.  Injuries to blood vessels.  Diseases that cause inflammation of blood vessels or cause blood vessel spasms.  Health behaviors and health history that increase your risk of developing PVD. RISK FACTORS  You may have a greater risk of PVD if you:  Have a family history of PVD.  Have certain medical conditions, including:  High cholesterol.  Diabetes.  High blood pressure (hypertension).  Coronary heart disease.  Past problems with blood clots.  Past injury, such as burns or a broken bone. These may have damaged blood vessels in your limbs.  Buerger disease. This is caused by inflamed blood  vessels in your hands and feet.  Some forms of arthritis.  Rare birth defects that affect the arteries in your legs.  Use tobacco.  Do not get enough exercise.  Are obese.  Are age 50 or older. SIGNS AND SYMPTOMS  PVD may cause many different symptoms. Your symptoms depend on what part of your body is not getting enough blood. Some common signs and symptoms include:  Cramps in your lower legs. This may be a symptom of poor leg circulation (claudication).  Pain and weakness in your legs while you are physically active that goes away when you rest (intermittent claudication).  Leg pain when at rest.  Leg numbness, tingling, or weakness.  Coldness in a leg or foot, especially when compared with the other leg.  Skin or hair changes. These can include:  Hair loss.  Shiny skin.  Pale or bluish skin.  Thick toenails.  Inability to get or maintain an erection (erectile dysfunction). People with PVD are more prone to developing ulcers and sores on their toes, feet, or legs. These may take longer than normal to heal. DIAGNOSIS Your health care provider may diagnose PVD from your signs and symptoms. The health care provider will also do a physical exam. You may have tests to find out what is causing your PVD and determine its severity. Tests may include:  Blood pressure recordings from your arms and legs and measurements of the strength of your pulses (pulse volume recordings).  Imaging studies using sound waves to take pictures of   the blood flow through your blood vessels (Doppler ultrasound).  Injecting a dye into your blood vessels before having imaging studies using:  X-rays (angiogram or arteriogram).  Computer-generated X-rays (CT angiogram).  A powerful electromagnetic field and a computer (magnetic resonance angiogram or MRA). TREATMENT Treatment for PVD depends on the cause of your condition and the severity of your symptoms. It also depends on your age. Underlying  causes need to be treated and controlled. These include long-lasting (chronic) conditions, such as diabetes, high cholesterol, and high blood pressure. You may need to first try making lifestyle changes and taking medicines. Surgery may be needed if these do not work. Lifestyle changes may include:  Quitting smoking.  Exercising regularly.  Following a low-fat, low-cholesterol diet. Medicines may include:  Blood thinners to prevent blood clots.  Medicines to improve blood flow.  Medicines to improve your blood cholesterol levels. Surgical procedures may include:  A procedure that uses an inflated balloon to open a blocked artery and improve blood flow (angioplasty).  A procedure to put in a tube (stent) to keep a blocked artery open (stent implant).  Surgery to reroute blood flow around a blocked artery (peripheral bypass surgery).  Surgery to remove dead tissue from an infected wound on the affected limb.  Amputation. This is surgical removal of the affected limb. This may be necessary in cases of acute ischemic limb that are not improved through medical or surgical treatments. HOME CARE INSTRUCTIONS  Take medicines only as directed by your health care provider.  Do not use any tobacco products, including cigarettes, chewing tobacco, or electronic cigarettes. If you need help quitting, ask your health care provider.  Lose weight if you are overweight, and maintain a healthy weight as directed by your health care provider.  Eat a diet that is low in fat and cholesterol. If you need help, ask your health care provider.  Exercise regularly. Ask your health care provider to suggest some good activities for you.  Use compression stockings or other mechanical devices as directed by your health care provider.  Take good care of your feet.  Wear comfortable shoes that fit well.  Check your feet often for any cuts or sores. SEEK MEDICAL CARE IF:  You have cramps in your legs  while walking.  You have leg pain when you are at rest.  You have coldness in a leg or foot.  Your skin changes.  You have erectile dysfunction.  You have cuts or sores on your feet that are not healing. SEEK IMMEDIATE MEDICAL CARE IF:  Your arm or leg turns cold and blue.  Your arms or legs become red, warm, swollen, painful, or numb.  You have chest pain or trouble breathing.  You suddenly have weakness in your face, arm, or leg.  You become very confused or lose the ability to speak.  You suddenly have a very bad headache or lose your vision.   This information is not intended to replace advice given to you by your health care provider. Make sure you discuss any questions you have with your health care provider.   Document Released: 09/07/2004 Document Revised: 08/21/2014 Document Reviewed: 01/08/2014 Elsevier Interactive Patient Education 2016 [employer]  

## 2016-03-07 NOTE — Progress Notes (Signed)
Chief Complaint: Follow up peripheral artery occlusive disease   History of Present Illness  Jeffrey Meadows is a 82 y.o. male who is s/p left distal SFA popliteal artery bypass graft that was completed in March 2006 by Dr. Kellie Simmering. He returns today for follow up.  Pt. denies claudication in legs with walking, denies non healing wounds. Knees and hips are painful with walking. He does have psoriasis. He exercises 3-5 days/week.  He denies any history of stroke or TIA. The patient reports bladder stone removed. He had a laser TURP.  Pt Diabetic: No Pt smoker: former smoker, quit in the 1980's.  Pt meds include: Statin :Yes ASA: No Other anticoagulants/antiplatelets: no    Past Medical History:  Diagnosis Date  . Adenomatous polyp of colon 08/2001   removed hepatic flexure  . Arthritis   . Cataract   . Chronic back pain    stenosis/scoliosis/synovial cyst  . Emphysema lung ([place])   . Enlarged prostate    takes Proscar daily  . History of blood transfusion    no abnormal reaction noted  . History of bronchitis    about 3-75yrs ago   . Hyperlipidemia    takes Atorvastatin daily  . Hypertension    takes Amlodipine and HCTZ daily  . Joint pain   . Joint swelling   . On home oxygen therapy    at night  . Raynaud disease   . Urinary frequency    takes Flomax daily  . Villous adenoma of colon 11/.2001   2 cm removed cecum    Social History Social History  Substance Use Topics  . Smoking status: Former Smoker    Packs/day: 1.50    Years: 40.00    Types: Cigarettes  . Smokeless tobacco: Never Used     Comment: quit smoking about 107yrs ago  . Alcohol use Yes     Comment: 3-5 glasses of wine daily    Family History Family History  Problem Relation Age of Onset  . Heart failure Mother   . Heart disease Mother   . Aneurysm Father     Surgical History Past Surgical History:  Procedure Laterality Date  . cataract surgery    . CYSTOSCOPY WITH  LITHOLAPAXY N/A 01/11/2016   Procedure: CYSTOSCOPY WITH LITHOLAPAXY;  Surgeon: Festus Aloe, MD;  Location: [facility];  Service: Urology;  Laterality: N/A;  . GREEN LIGHT LASER TURP (TRANSURETHRAL RESECTION OF PROSTATE N/A 01/11/2016   Procedure: GREEN LIGHT LASER TURP (TRANSURETHRAL RESECTION OF PROSTATE;  Surgeon: Festus Aloe, MD;  Location: [facility];  Service: Urology;  Laterality: N/A;  . HERNIA REPAIR Left    inguinal  . HOLMIUM LASER APPLICATION N/A [id number]   Procedure: HOLMIUM LASER APPLICATION;  Surgeon: Festus Aloe, MD;  Location: [facility];  Service: Urology;  Laterality: N/A;  . JOINT REPLACEMENT     Right Hip  . LAMINECTOMY Right 07/02/2014   Procedure: Right Lumbar two-three Laminectomy for Synovial Cyst;  Surgeon: Erline Levine, MD;  Location: [place] [facility];  Service: Neurosurgery;  Laterality: Right;  . POPLITEAL VENOUS ANEURYSM REPAIR Left 2010   about 6 yrs ago  . PROSTATE BIOPSY  4/95  . SPINE SURGERY  Jan. 2016   Dr. Vickie Epley  . TONSILLECTOMY    . TOTAL HIP ARTHROPLASTY  1952   right    Allergies  Allergen Reactions  . Antihistamines, Diphenhydramine-Type Other (See Comments)    Enlarged prostate    Current Outpatient Prescriptions  Medication Sig Dispense  Refill  . albuterol (PROAIR HFA) 108 (90 BASE) MCG/ACT inhaler Inhale 1-2 puffs into the lungs every 4 (four) hours as needed. as needed shortness of breath prior to exercise 1 Inhaler 11  . amLODipine (NORVASC) 10 MG tablet TAKE 1 TABLET BY MOUTH DAILY 90 tablet 3  . atorvastatin (LIPITOR) 20 MG tablet TAKE 1 TABLET BY MOUTH DAILY AT 6PM 90 tablet 3  . calcipotriene (DOVONOX) 0.005 % ointment Apply 1 application topically 2 (two) times daily. Applies to affected area.  3  . Cholecalciferol (VITAMIN D) 2000 UNITS CAPS Take 2,000 Units by mouth daily.     . hydrochlorothiazide (HYDRODIURIL) 25 MG tablet TAKE 1 TABLET BY MOUTH EVERY MORNING 90 tablet 3  . Multiple Vitamin (MULTIVITAMIN WITH MINERALS) TABS  tablet Take 1 tablet by mouth daily.    . nitrofurantoin, macrocrystal-monohydrate, (MACROBID) 100 MG capsule Take 1 capsule (100 mg total) by mouth at bedtime. 7 capsule 0  . Omega-3 Fatty Acids (FISH OIL PO) Take 2,000 Units by mouth daily.     . potassium chloride (K-DUR) 10 MEQ tablet Take 1 tablet (10 mEq total) by mouth once. (Patient taking differently: Take 10 mEq by mouth daily. ) 90 tablet 1  . Tiotropium Bromide-Olodaterol (STIOLTO RESPIMAT) 2.5-2.5 MCG/ACT AERS Inhale 2 puffs into the lungs daily. 1 Inhaler 0   No current facility-administered medications for this visit.     Review of Systems : See HPI for pertinent positives and negatives.  Physical Examination  Vitals:   03/07/16 1059  BP: (!) 153/86  Pulse: 71  Temp: 98 F (36.7 C)  Weight: 154 lb 4 oz (70 kg)  Height: 5\' 8"  (1.727 m)   Body mass index is 23.45 kg/m.  General: A&O x 3, WDWN. Gait: normal Eyes: PERRLA. Pulmonary: Respirations are non labored, CTAB, without wheezes, rales or rhonchi. Cardiac: regular rhythm, no detected murmur.     Carotid Bruits Left Right   Negative Negative   Aorta is not palpable. Radial pulses: are 2+ palpable and =.   VASCULAR EXAM: Extremities without ischemic changes  without Gangrene; without open wounds.     LE Pulses LEFT RIGHT   FEMORAL  palpable not palpable    POPLITEAL not palpable  not palpable   POSTERIOR TIBIAL not palpable   palpable    DORSALIS PEDIS  ANTERIOR TIBIAL not palpable  2+ palpable    Abdomen: soft, NT, no palpable masses. Skin: red macular psoriasis patches on anterior lower legs has resolved, no ulcers. Musculoskeletal: no muscle wasting or atrophy. Neurologic: A&O X 3; Appropriate Affect ; SENSATION:  normal; MOTOR FUNCTION: moving all extremities equally, motor strength 5/5 throughout. Speech is fluent/normal. CN 2-12 intact. Fine tremor in outstretched fingers.         Non-Invasive Vascular Imaging  Left LE Arterial Duplex: (03/02/2016):  LOWER EXTREMITY ARTERIAL DUPLEX EVALUATION    INDICATION: Peripheral arterial disease    PREVIOUS INTERVENTION(S):  Left femoropopliteal bypass graft placed 10-14-04.    DUPLEX EXAM:     RIGHT  LEFT   Peak Systolic Velocity (cm/s) Ratio (if abnormal) Waveform  Peak Systolic Velocity (cm/s) Ratio (if abnormal) Waveform     Inflow Artery 46  T     Proximal Anastomosis 40  M     Proximal Graft 17  M     Mid Graft 37  M      Distal Graft 64  M     Distal Anastomosis 51  M     Outflow  Artery 19  T  [place]/0.89 Today's ABI / TBI [place]/0.65  [place]/1.41 Previous ABI / TBI (12/08/14  ) Non compressible    Waveform:    M - Monophasic       B - Biphasic       T - Triphasic  If Ankle Brachial Index (ABI) or Toe Brachial Index (TBI) performed, please see complete report     ADDITIONAL FINDINGS: 50-74% stenosis noted in the mid/distal left femoral artery.    IMPRESSION: Patent left lower extremity bypass graft with no evidence of restenosis.    Compared to the previous exam:  Waveforms in bypass graft have gone from biphasic to monophasic since study of 12-08-14.     Assessment: Jeffrey Meadows is a 82 y.o. male who is s/p left distal SFA popliteal artery bypass graft that was completed in March 2006. He has no claudication with walking, no signs of ischemia in his feet/legs.  03/02/2016 left LE arterial duplex suggests 50-74% stenosis in the mid/distal left femoral artery. Patent left lower extremity bypass graft with no evidence of restenosis.  Waveforms in bypass graft have gone from biphasic to monophasic since study of 12-08-14.   His walking is limited by a painful left foot bone spur, see Plan.  I discussed pt HPI and results of ABI and left LE  arterial duplex with Dr. Donnetta Hutching.   Plan: Follow-up in 6 months with ABI's and left LE arterial duplex. He is unable to participate in a graduated walking program due to the bone spur on the ball of his left foot causing pain with walking. He will use his stationary bike for a total of 30 minutes daily.   I discussed in depth with the patient the nature of atherosclerosis, and emphasized the importance of maximal medical management including strict control of blood pressure, blood glucose, and lipid levels, obtaining regular exercise, and continued cessation of smoking.  The patient is aware that without maximal medical management the underlying atherosclerotic disease process will progress, limiting the benefit of any interventions. The patient was given information about PAD and what symptoms should prompt the patient to seek immediate medical care. Thank you for allowing [place] to participate in this patient's care.  Clemon Chambers, RN, MSN, FNP-C [facility] Office: [phone]  Clinic Physician: Early  03/07/16 11:19 AM

## 2016-03-08 ENCOUNTER — Other Ambulatory Visit (INDEPENDENT_AMBULATORY_CARE_PROVIDER_SITE_OTHER): Payer: Medicare Other | Admitting: Internal Medicine

## 2016-03-08 ENCOUNTER — Telehealth: Payer: Self-pay

## 2016-03-08 ENCOUNTER — Ambulatory Visit
Admission: RE | Admit: 2016-03-08 | Discharge: 2016-03-08 | Disposition: A | Discharge status: Home / Self Care | Payer: Medicare Other | Source: Ambulatory Visit | Attending: Sports Medicine | Admitting: Sports Medicine

## 2016-03-08 DIAGNOSIS — Z1211 Encounter for screening for malignant neoplasm of colon: Secondary | ICD-10-CM

## 2016-03-08 DIAGNOSIS — M79602 Pain in left arm: Secondary | ICD-10-CM

## 2016-03-08 LAB — FECAL OCCULT BLOOD, GUAIAC: Fecal Occult Blood: NEGATIVE

## 2016-03-08 NOTE — Telephone Encounter (Signed)
Called patient to give FOB card results. No answer.  Will try later.

## 2016-03-20 NOTE — Addendum Note (Signed)
Addended by: Cyd Silence on: 03/20/2016 09:19 AM   Modules accepted: Orders

## 2016-03-22 ENCOUNTER — Encounter: Payer: Self-pay | Admitting: Physical Therapy

## 2016-03-22 ENCOUNTER — Ambulatory Visit: Payer: Medicare Other | Attending: Sports Medicine | Admitting: Physical Therapy

## 2016-03-22 DIAGNOSIS — R29898 Other symptoms and signs involving the musculoskeletal system: Secondary | ICD-10-CM | POA: Diagnosis present

## 2016-03-22 DIAGNOSIS — M542 Cervicalgia: Secondary | ICD-10-CM | POA: Diagnosis present

## 2016-03-22 DIAGNOSIS — M62838 Other muscle spasm: Secondary | ICD-10-CM | POA: Insufficient documentation

## 2016-03-22 NOTE — Therapy (Signed)
[place], [place], [address] Phone: [phone]   Fax:  [phone]  Physical Therapy Evaluation  Patient Details  Name: Jeffrey Meadows MRN: [id number] Date of Birth: January 21, 1933 Referring Provider: Stefanie Libel MD  Encounter Date: 03/22/2016      PT End of Session - 03/22/16 1256    Visit Number 1   Number of Visits 13   Date for PT Re-Evaluation 05/03/16   Authorization Type Medicare: kx mod by 15 visit, progress not by 10th visit   PT Start Time 1016   PT Stop Time 1102   PT Time Calculation (min) 46 min   Activity Tolerance Patient tolerated treatment well   Behavior During Therapy [facility] for tasks assessed/performed      Past Medical History:  Diagnosis Date  . Adenomatous polyp of colon 08/2001   removed hepatic flexure  . Arthritis   . Cataract   . Chronic back pain    stenosis/scoliosis/synovial cyst  . Emphysema lung ([place])   . Enlarged prostate    takes Proscar daily  . History of blood transfusion    no abnormal reaction noted  . History of bronchitis    about 3-88yrs ago   . Hyperlipidemia    takes Atorvastatin daily  . Hypertension    takes Amlodipine and HCTZ daily  . Joint pain   . Joint swelling   . On home oxygen therapy    at night  . Raynaud disease   . Urinary frequency    takes Flomax daily  . Villous adenoma of colon 11/.2001   2 cm removed cecum    Past Surgical History:  Procedure Laterality Date  . cataract surgery    . CYSTOSCOPY WITH LITHOLAPAXY N/A 01/11/2016   Procedure: CYSTOSCOPY WITH LITHOLAPAXY;  Surgeon: Festus Aloe, MD;  Location: [facility];  Service: Urology;  Laterality: N/A;  . GREEN LIGHT LASER TURP (TRANSURETHRAL RESECTION OF PROSTATE N/A 01/11/2016   Procedure: GREEN LIGHT LASER TURP (TRANSURETHRAL RESECTION OF PROSTATE;  Surgeon: Festus Aloe, MD;  [facility];  Service: Urology;  Laterality: N/A;  . HERNIA REPAIR Left    inguinal  .  HOLMIUM LASER APPLICATION N/A [id number]   Procedure: HOLMIUM LASER APPLICATION;  Surgeon: Festus Aloe, MD;  [facility];  Service: Urology;  Laterality: N/A;  . JOINT REPLACEMENT     Right Hip  . LAMINECTOMY Right 07/02/2014   Procedure: Right Lumbar two-three Laminectomy for Synovial Cyst;  Surgeon: Erline Levine, MD;  Location: [place] [facility];  Service: Neurosurgery;  Laterality: Right;  . POPLITEAL VENOUS ANEURYSM REPAIR Left 2010   about 6 yrs ago  . PROSTATE BIOPSY  4/95  . SPINE SURGERY  Jan. 2016   Dr. Vickie Epley  . TONSILLECTOMY    . TOTAL HIP ARTHROPLASTY  1952   right    There were no vitals filed for this visit.       Subjective Assessment - 03/22/16 1029    Subjective pt is a 82 y.o M with CC of l neck pain with referral to the L shoulder that started about 8 months ago that started gradually and worsened over time. pain doesn't go past the top of the shoulder and pain seems to be constant not getting worse.  pt denies any N/t in the shoulder or arm. no hx of ha, vision disturbances or synocpe.    Pertinent History THA    Limitations Walking;Lifting   How long can you sit  comfortably? unlimited   How long can you stand comfortably? 45 min   How long can you walk comfortably? 20 min   Diagnostic tests x-ray on 03/08/2016   Patient Stated Goals to be pain free, to be able to do more, not have to take tylenol for pain   Currently in Pain? Yes   Pain Score 2    Pain Location Neck   Pain Orientation Left   Pain Descriptors / Indicators Tightness  pulling   Pain Type Chronic pain   Pain Radiating Towards to the top of the shoulder along the upper trap   Pain Onset More than a month ago   Pain Frequency Constant   Aggravating Factors  heavy lifting over 50#, looking to the L, leaning head to the R, quick movements,    Pain Relieving Factors tylenol, stopping and resting,             [facility] PT Assessment - 03/22/16 1040      Assessment   Medical Diagnosis L  neck pain with referral to L shoulder   Referring Provider Stefanie Libel MD   Onset Date/Surgical Date --  8 months   Hand Dominance Right   Next MD Visit make on PRN   Prior Therapy no     Precautions   Precautions None     Restrictions   Weight Bearing Restrictions No     Balance Screen   Has the patient fallen in the past 6 months No   Has the patient had a decrease in activity level because of a fear of falling?  No   Is the patient reluctant to leave their home because of a fear of falling?  No     Home Environment   Living Environment Private residence   Living Arrangements Spouse/significant other   Available Help at Discharge Available PRN/intermittently   Type of [place] to enter   Entrance Stairs-Number of Steps 12   Entrance Stairs-Rails Right   Home Layout Two level   Alternate Level Stairs-Number of Steps 15   Alternate Level Stairs-Rails Right     Prior Function   Level of Independence Independent;Independent with basic ADLs   Vocation Retired     Observation/Other Assessments   Focus on Therapeutic Outcomes (FOTO)  31% limited  predicted 29% limited     Posture/Postural Control   Posture/Postural Control Postural limitations   Postural Limitations Rounded Shoulders;Forward head;Increased thoracic kyphosis     ROM / Strength   AROM / PROM / Strength AROM;Strength     AROM   Overall AROM Comments nuetral position for pt is sitting in 14 degrees of R cervical rotation   AROM Assessment Site Cervical;Shoulder   Right/Left Shoulder Right;Left   Cervical Flexion 62   Cervical Extension 42   Cervical - Right Side Bend 21  soreness at end range in L upper trap   Cervical - Left Side Bend 20  soreness at end range    Cervical - Right Rotation 58   Cervical - Left Rotation 50  tightness at endrange     Strength   Strength Assessment Site Shoulder   Right/Left Shoulder Right;Left     Palpation   Spinal mobility hypomobility of  PAIVM at C3-C7 with no pain noted during assessment   Palpation comment tightness of bil upper traps with L>R with multiple palpable trigger points in the L upper trap/ levator scapulae on the L.  Special Tests    Special Tests Cervical   Cervical Tests Spurling's;Dictraction;other     Spurling's   Findings Negative     Distraction Test   Findngs Negative     other    Findings Negative   Comment ULTT                           PT Education - 03/22/16 1254    Education provided Yes   Education Details evaluating findings, anatomy of the upper trap and effects of shoulder hiking that causes tightness, POC, goals, HEP with proper form and rationale,    Person(s) Educated Patient;Spouse   Methods Explanation;Handout;Verbal cues   Comprehension Verbalized understanding;Verbal cues required          PT Short Term Goals - 03/22/16 1306      PT SHORT TERM GOAL #1   Title pt will be I with inital HEP (04/12/2016)   Time 3   Period Weeks   Status New     PT SHORT TERM GOAL #2   Title pt will be able to verbalize and demo proper posture and lifting/ carrying mechanics to prevent and reduce neck/ shoulder pain (04/12/2016)   Time 3   Period Weeks   Status New     PT SHORT TERM GOAL #3   Title pt will demonstrate decrease muscle tightness in the L upper trap and surrounding musclature to reduce pain to </= 2/10 pain and promote cervical ROM (04/12/2016)   Time 3   Period Weeks   Status New     PT SHORT TERM GOAL #4   Title assess shoulder mobility and strength and make goals    Time 2   Period Weeks           PT Long Term Goals - 03/22/16 1307      PT LONG TERM GOAL #1   Title pt will be I with all HEP given throughout therapy as of last visit (05/03/2016)   Time 6   Period Weeks   Status New     PT LONG TERM GOAL #2   Title pt will improve cervical rotation/ side bending bil by >/= 8 degrees with </= 2/10 pain to assist with safety during  driving and ADLS ([id number])    Time 6   Period Weeks   Status New     PT LONG TERM GOAL #3   Title pt will be able to lift and carrying >/= 25# with </= 2/10 pain to assist with ADLs including carrying firewood per pt's personal goal (05/03/2016)   Baseline reported lifting firewood caused pain    Time 6   Period Weeks   Status New     PT LONG TERM GOAL #4   Title Increase FOTO score by >/= 5 points to demontrate improvement in function (05/03/2016)   Time 6   Period Weeks   Status New               Plan - 03/22/16 1257    Clinical Impression Statement Mr. Haverty presents to OPPT as a low complexity evaluation with CC of chronic neck pain with referral to the L upper trap region. He demonstrates limited cervical mobility, with mild soreness at end ranges of roations/ side bending.  pt demos forward head posture with ant rolled shoulders with tightness in bil upper traps and levator scapulae/ cervical paraspinals with L>R with mulitpl trigger points. Hypomobility of C3-C7 with  no pain during assessment. pt would benefit from physical therapy to reduce pain, decrase stiffness, improve mobility and overall maximize his function by addressing the defecits listed.    Rehab Potential Good   PT Frequency 2x / week   PT Duration 6 weeks   PT Treatment/Interventions ADLs/Self Care Home Management;Cryotherapy;Electrical Stimulation;Iontophoresis 4mg /ml Dexamethasone;Taping;Dry needling;Manual techniques;Moist Heat;Ultrasound;Gait training;Patient/family education;Manual lymph drainage;Passive range of motion;Therapeutic exercise;Therapeutic activities   PT Next Visit Plan assess/ review HEP, stretching of L upper trap, soft tissue work, cervical mobs, modalities, measure shoulder ROM/ strength make goals based assessment   PT Home Exercise Plan rows, chin tucks, upper cervical rotation, upper trap stretch   Consulted and Agree with Plan of Care Patient      Patient will benefit from  skilled therapeutic intervention in order to improve the following deficits and impairments:  Pain, Improper body mechanics, Postural dysfunction, Hypomobility, Decreased strength, Decreased mobility, Decreased endurance, Decreased activity tolerance, Decreased balance, Increased fascial restricitons, Decreased range of motion  Visit Diagnosis: Cervicalgia - Plan: PT plan of care cert/re-cert  Other muscle spasm - Plan: PT plan of care cert/re-cert  Other symptoms and signs involving the musculoskeletal system - Plan: PT plan of care cert/re-cert      G-Codes - [id number]    Functional Assessment Tool Used FOTO/ clinical judgement   Functional Limitation Changing and maintaining body position   Changing and Maintaining Body Position Current Status [id number]) At least 20 percent but less than 40 percent impaired, limited or restricted   Changing and Maintaining Body Position Goal Status [id number]) At least 1 percent but less than 20 percent impaired, limited or restricted       Problem List Patient Active Problem List   Diagnosis Date Noted  . Osteoarthritis of cervical spine 03/01/2016  . Strain of left trapezius muscle 10/21/2015  . Synovial cyst of lumbar spine 07/03/2014  . Synovial cyst of lumbar facet joint 07/02/2014  . Peripheral vascular disease, unspecified ([place]) 12/02/2013  . Essential tremor 10/14/2013  . Hoarseness of voice 04/12/2013  . Nasal septal deviation 07/16/2012  . Arch pain 05/09/2012  . Claudication in peripheral vascular disease ([place]) 11/21/2011  . Constipation 03/09/2011  . Hearing impairment 03/09/2011  . Allergic rhinitis 02/02/2011  . Hip pain, chronic 06/08/2010  . Tobacco use disorder, severe, in sustained remission 03/18/2010  . BPH with obstruction/lower urinary tract symptoms 06/17/2008  . ANOMALY, CONGENITAL HYPOSPADIAS 04/23/2007  . HYPERLIPIDEMIA 10/11/2006  . HYPERTENSION, BENIGN SYSTEMIC 10/11/2006  . HEMORRHOIDS, NOS 10/11/2006  . COPD  with emphysema ([place]) 10/11/2006  . PSORIASIS 10/11/2006  . GAIT, ABNORMAL 10/11/2006   [place] PT, DPT, LAT, ATC  03/22/16  1:17 PM      [facility] [address] [place], [place], [address] Phone: [phone]   Fax:  [phone]  Name: Jeffrey Meadows MRN: [id number] Date of Birth: Dec 04, 1932

## 2016-03-24 ENCOUNTER — Ambulatory Visit: Payer: Medicare Other | Admitting: Physical Therapy

## 2016-03-24 DIAGNOSIS — M62838 Other muscle spasm: Secondary | ICD-10-CM

## 2016-03-24 DIAGNOSIS — M542 Cervicalgia: Secondary | ICD-10-CM

## 2016-03-24 DIAGNOSIS — R29898 Other symptoms and signs involving the musculoskeletal system: Secondary | ICD-10-CM

## 2016-03-24 NOTE — Therapy (Signed)
[place], [place], [address] Phone: [phone]   Fax:  [phone]  Physical Therapy Treatment  Patient Details  Name: Jeffrey Meadows MRN: [id number] Date of Birth: September 22, 1932 Referring Provider: Stefanie Libel MD  Encounter Date: 03/24/2016      PT End of Session - 03/24/16 1219    Visit Number 2   Number of Visits 13   Date for PT Re-Evaluation 05/03/16   PT Start Time 0847   PT Stop Time 0930   PT Time Calculation (min) 43 min   Activity Tolerance Patient tolerated treatment well   Behavior During Therapy [facility] for tasks assessed/performed      Past Medical History:  Diagnosis Date  . Adenomatous polyp of colon 08/2001   removed hepatic flexure  . Arthritis   . Cataract   . Chronic back pain    stenosis/scoliosis/synovial cyst  . Emphysema lung ([place])   . Enlarged prostate    takes Proscar daily  . History of blood transfusion    no abnormal reaction noted  . History of bronchitis    about 3-8yr ago   . Hyperlipidemia    takes Atorvastatin daily  . Hypertension    takes Amlodipine and HCTZ daily  . Joint pain   . Joint swelling   . On home oxygen therapy    at night  . Raynaud disease   . Urinary frequency    takes Flomax daily  . Villous adenoma of colon 11/.2001   2 cm removed cecum    Past Surgical History:  Procedure Laterality Date  . cataract surgery    . CYSTOSCOPY WITH LITHOLAPAXY N/A 01/11/2016   Procedure: CYSTOSCOPY WITH LITHOLAPAXY;  Surgeon: MFestus Aloe MD;  [facility];  Service: Urology;  Laterality: N/A;  . GREEN LIGHT LASER TURP (TRANSURETHRAL RESECTION OF PROSTATE N/A 01/11/2016   Procedure: GREEN LIGHT LASER TURP (TRANSURETHRAL RESECTION OF PROSTATE;  Surgeon: MFestus Aloe MD;  [facility];  Service: Urology;  Laterality: N/A;  . HERNIA REPAIR Left    inguinal  . HOLMIUM LASER APPLICATION N/A 59/19/1660  Procedure: HOLMIUM LASER  APPLICATION;  Surgeon: MFestus Aloe MD;  [facility];  Service: Urology;  Laterality: N/A;  . JOINT REPLACEMENT     Right Hip  . LAMINECTOMY Right 07/02/2014   Procedure: Right Lumbar two-three Laminectomy for Synovial Cyst;  Surgeon: JErline Levine MD;  [facility];  Service: Neurosurgery;  Laterality: Right;  . POPLITEAL VENOUS ANEURYSM REPAIR Left 2010   about 6 yrs ago  . PROSTATE BIOPSY  4/95  . SPINE SURGERY  Jan. 2016   Dr. JVickie Epley . TONSILLECTOMY    . TOTAL HIP ARTHROPLASTY  1952   right    There were no vitals filed for this visit.      Subjective Assessment - 03/24/16 0855    Subjective DShoulder bothers if he tries to lift heavy things.  No neck pain.  Hips 2/10 RT>Lt   Currently in Pain? Yes   Pain Score 0-No pain            [facility] PT Assessment - 03/24/16 0001      AROM   Overall AROM Comments Lt shoulder 133, abduction 130,  ER shoulder at 90 , 110     Strength   Overall Strength --  Left 4/5,   RT flexion and abduction 4/5,  IR/ER 4+/5  [place] Adult PT Treatment/Exercise - 03/24/16 0001      Neck Exercises: Seated   Neck Retraction 5 reps;5 secs  chin tuck ,  cues   Cervical Rotation 5 reps  5 seconds each side, cues     Shoulder Exercises: Standing   Row 12 reps   Row Limitations cues to widen legs to assist balance     Manual Therapy   Manual Therapy Soft tissue mobilization   Manual therapy comments tissue softened he was tender and asked me to not push so hard.        Neck Exercises: Stretches   Upper Trapezius Stretch 2 reps;10 seconds  both   Levator Stretch 2 reps;10 seconds  both                PT Education - 03/24/16 1219    Education provided Yes   Education Details poster of anatomy discusses during soft tiuuse work   Forensic psychologist) Educated Patient   Methods Explanation   Comprehension Verbalized understanding          PT Short Term Goals - 03/22/16 1306       PT SHORT TERM GOAL #1   Title pt will be I with inital HEP (04/12/2016)   Time 3   Period Weeks   Status New     PT SHORT TERM GOAL #2   Title pt will be able to verbalize and demo proper posture and lifting/ carrying mechanics to prevent and reduce neck/ shoulder pain (04/12/2016)   Time 3   Period Weeks   Status New     PT SHORT TERM GOAL #3   Title pt will demonstrate decrease muscle tightness in the L upper trap and surrounding musclature to reduce pain to </= 2/10 pain and promote cervical ROM (04/12/2016)   Time 3   Period Weeks   Status New     PT SHORT TERM GOAL #4   Title assess shoulder mobility and strength and make goals    Time 2   Period Weeks           PT Long Term Goals - 03/22/16 1307      PT LONG TERM GOAL #1   Title pt will be I with all HEP given throughout therapy as of last visit (05/03/2016)   Time 6   Period Weeks   Status New     PT LONG TERM GOAL #2   Title pt will improve cervical rotation/ side bending bil by >/= 8 degrees with </= 2/10 pain to assist with safety during driving and ADLS (04/08/4157)    Time 6   Period Weeks   Status New     PT LONG TERM GOAL #3   Title pt will be able to lift and carrying >/= 25# with </= 2/10 pain to assist with ADLs including carrying firewood per pt's personal goal (05/03/2016)   Baseline reported lifting firewood caused pain    Time 6   Period Weeks   Status New     PT LONG TERM GOAL #4   Title Increase FOTO score by >/= 5 points to demontrate improvement in function (05/03/2016)   Time 6   Period Weeks   Status New               Plan - 03/24/16 1220    Clinical Impression Statement Patient has less pain today.  He needs instructions with home exercise program,  cues. No new objective findings. No new goals met.  PT Next Visit Plan Cervical stabilization,  stretches,  consider decompression and scapular stabilization.    PT Home Exercise Plan continue   Consulted and Agree with Plan of Care  Patient      Patient will benefit from skilled therapeutic intervention in order to improve the following deficits and impairments:  Pain, Improper body mechanics, Postural dysfunction, Hypomobility, Decreased strength, Decreased mobility, Decreased endurance, Decreased activity tolerance, Decreased balance, Increased fascial restricitons, Decreased range of motion  Visit Diagnosis: Cervicalgia  Other muscle spasm  Other symptoms and signs involving the musculoskeletal system     Problem List Patient Active Problem List   Diagnosis Date Noted  . Osteoarthritis of cervical spine 03/01/2016  . Strain of left trapezius muscle 10/21/2015  . Synovial cyst of lumbar spine 07/03/2014  . Synovial cyst of lumbar facet joint 07/02/2014  . Peripheral vascular disease, unspecified ([place]) 12/02/2013  . Essential tremor 10/14/2013  . Hoarseness of voice 04/12/2013  . Nasal septal deviation 07/16/2012  . Arch pain 05/09/2012  . Claudication in peripheral vascular disease ([place]) 11/21/2011  . Constipation 03/09/2011  . Hearing impairment 03/09/2011  . Allergic rhinitis 02/02/2011  . Hip pain, chronic 06/08/2010  . Tobacco use disorder, severe, in sustained remission 03/18/2010  . BPH with obstruction/lower urinary tract symptoms 06/17/2008  . ANOMALY, CONGENITAL HYPOSPADIAS 04/23/2007  . HYPERLIPIDEMIA 10/11/2006  . HYPERTENSION, BENIGN SYSTEMIC 10/11/2006  . HEMORRHOIDS, NOS 10/11/2006  . COPD with emphysema ([place]) 10/11/2006  . PSORIASIS 10/11/2006  . GAIT, ABNORMAL 10/11/2006    [facility] 03/24/2016, 12:24 PM  [facility] [address] [place], [place], [address] Phone: [phone]   Fax:  [phone]  Name: NEPHTALI DOCKEN MRN: [id number] Date of Birth: 26-Apr-1933   Melvenia Needles, PTA 03/24/16 12:24 PM Phone: [phone] Fax: [phone]

## 2016-03-29 ENCOUNTER — Ambulatory Visit: Payer: Medicare Other | Admitting: Physical Therapy

## 2016-03-29 DIAGNOSIS — M542 Cervicalgia: Secondary | ICD-10-CM | POA: Diagnosis not present

## 2016-03-29 DIAGNOSIS — R29898 Other symptoms and signs involving the musculoskeletal system: Secondary | ICD-10-CM

## 2016-03-29 DIAGNOSIS — M62838 Other muscle spasm: Secondary | ICD-10-CM

## 2016-03-29 NOTE — Therapy (Signed)
[place], [place], [address] Phone: [phone]   Fax:  [phone]  Physical Therapy Treatment  Patient Details  Name: Jeffrey Meadows MRN: [id number] Date of Birth: [birth year] Referring Provider: Stefanie Libel MD  Encounter Date: 03/29/2016      PT End of Session - 03/29/16 1544    Visit Number 3   Number of Visits 13   Date for PT Re-Evaluation 05/03/16   Authorization Type Medicare: kx mod by 15 visit, progress not by 10th visit   PT Start Time 1502   PT Stop Time 1544   PT Time Calculation (min) 42 min   Activity Tolerance Patient tolerated treatment well   Behavior During Therapy Jeffrey Meadows for tasks assessed/performed      Past Medical History:  Diagnosis Date  . Adenomatous polyp of colon 08/2001   removed hepatic flexure  . Arthritis   . Cataract   . Chronic back pain    stenosis/scoliosis/synovial cyst  . Emphysema lung ([place])   . Enlarged prostate    takes Proscar daily  . History of blood transfusion    no abnormal reaction noted  . History of bronchitis    about 3-71yrs ago   . Hyperlipidemia    takes Atorvastatin daily  . Hypertension    takes Amlodipine and HCTZ daily  . Joint pain   . Joint swelling   . On home oxygen therapy    at night  . Raynaud disease   . Urinary frequency    takes Flomax daily  . Villous adenoma of colon 11/.2001   2 cm removed cecum    Past Surgical History:  Procedure Laterality Date  . cataract surgery    . CYSTOSCOPY WITH LITHOLAPAXY N/A 01/11/2016   Procedure: CYSTOSCOPY WITH LITHOLAPAXY;  Surgeon: Jeffrey Aloe, MD;  [facility];  Service: Urology;  Laterality: N/A;  . GREEN LIGHT LASER TURP (TRANSURETHRAL RESECTION OF PROSTATE N/A 01/11/2016   Procedure: GREEN LIGHT LASER TURP (TRANSURETHRAL RESECTION OF PROSTATE;  Surgeon: Jeffrey Aloe, MD;  [facility];  Service: Urology;  Laterality: N/A;  . HERNIA REPAIR Left    inguinal  .  HOLMIUM LASER APPLICATION N/A [id number]   Procedure: HOLMIUM LASER APPLICATION;  Surgeon: Jeffrey Aloe, MD;  [facility];  Service: Urology;  Laterality: N/A;  . JOINT REPLACEMENT     Right Hip  . LAMINECTOMY Right 07/02/2014   Procedure: Right Lumbar two-three Laminectomy for Synovial Cyst;  Surgeon: Jeffrey Levine, MD;  Location: [place] [facility];  Service: Neurosurgery;  Laterality: Right;  . POPLITEAL VENOUS ANEURYSM REPAIR Left 2010   about 6 yrs ago  . PROSTATE BIOPSY  4/95  . SPINE SURGERY  Jan. 2016   Dr. Vickie Meadows  . TONSILLECTOMY    . TOTAL HIP ARTHROPLASTY  1952   right    There were no vitals filed for this visit.      Subjective Assessment - 03/29/16 1505    Subjective "doing about the same, I guess its tough to tell"    Currently in Pain? Yes   Pain Score 0-No pain   Pain Orientation Left   Pain Type Chronic pain   Pain Onset More than a month ago   Pain Frequency Constant   Aggravating Factors  heavy lifting            [facility] PT Assessment - 03/29/16 1532      AROM   Cervical - Right Side Bend 24  Cervical - Left Side Bend 22   Cervical - Right Rotation 60   Cervical - Left Rotation 68     Strength   Left Shoulder ABduction 4+/5   Left Shoulder Internal Rotation 4+/5   Left Shoulder External Rotation 4+/5                     [facility] Adult PT Treatment/Exercise - 03/29/16 0001      Shoulder Exercises: Seated   Other Seated Exercises thoracic extension over back of chair 2 x 10     Shoulder Exercises: Standing   Row 12 reps;Both;AROM     Manual Therapy   Manual Therapy Joint mobilization;Soft tissue mobilization;Myofascial release   Joint Mobilization C3-C7 grade 3 central/ unilateral oscillations mobs bil.    Soft tissue mobilization DTM over the L upper trap/ levator scapulae   Myofascial Release manual trigger point release x 5 in upper trap / levator scapulae     Neck Exercises: Stretches   Upper Trapezius Stretch 10  seconds;3 reps  contact/ relax with 10 sec contraction   Levator Stretch 2 reps;30 seconds                PT Education - 03/29/16 1541    Education provided Yes   Education Details thoracic mobility updated HEP   Person(s) Educated Patient   Methods Explanation;Verbal cues;Handout;Demonstration   Comprehension Verbalized understanding;Verbal cues required;Returned demonstration          PT Short Term Goals - 03/29/16 1547      PT SHORT TERM GOAL #1   Title pt will be I with inital HEP (04/12/2016)   Time 3   Period Weeks   Status Achieved     PT SHORT TERM GOAL #2   Title pt will be able to verbalize and demo proper posture and lifting/ carrying mechanics to prevent and reduce neck/ shoulder pain (04/12/2016)   Time 3   Period Weeks   Status On-going     PT SHORT TERM GOAL #3   Title pt will demonstrate decrease muscle tightness in the L upper trap and surrounding musclature to reduce pain to </= 2/10 pain and promote cervical ROM (04/12/2016)   Time 3   Period Weeks   Status On-going     PT SHORT TERM GOAL #4   Title assess shoulder mobility and strength and make goals    Baseline WFL no goals necessary   Period Weeks   Status Achieved           PT Long Term Goals - 03/22/16 1307      PT LONG TERM GOAL #1   Title pt will be I with all HEP given throughout therapy as of last visit (05/03/2016)   Time 6   Period Weeks   Status New     PT LONG TERM GOAL #2   Title pt will improve cervical rotation/ side bending bil by >/= 8 degrees with </= 2/10 pain to assist with safety during driving and ADLS ([id number])    Time 6   Period Weeks   Status New     PT LONG TERM GOAL #3   Title pt will be able to lift and carrying >/= 25# with </= 2/10 pain to assist with ADLs including carrying firewood per pt's personal goal (05/03/2016)   Baseline reported lifting firewood caused pain    Time 6   Period Weeks   Status New     PT LONG TERM GOAL #  4   Title  Increase FOTO score by >/= 5 points to demontrate improvement in function (05/03/2016)   Time 6   Period Weeks   Status New               Plan - 03/29/16 1544    Clinical Impression Statement Jeffrey Meadows continues to make progress with physcial therapy improving cervical mobilty in all planes. Focsued todays session on manual trigger point release and DTM along the L upper trap/ levator scap, and cervical mobs. pt reported no pain post session.  assessed shoulder strength which he demo's functional strength.    PT Next Visit Plan Cervical stabilization,  stretches,  consider decompression and scapular stabilization.    PT Home Exercise Plan thoracic extension over chair   Consulted and Agree with Plan of Care Patient      Patient will benefit from skilled therapeutic intervention in order to improve the following deficits and impairments:  Pain, Improper body mechanics, Postural dysfunction, Hypomobility, Decreased strength, Decreased mobility, Decreased endurance, Decreased activity tolerance, Decreased balance, Increased fascial restricitons, Decreased range of motion  Visit Diagnosis: Cervicalgia  Other muscle spasm  Other symptoms and signs involving the musculoskeletal system     Problem List Patient Active Problem List   Diagnosis Date Noted  . Osteoarthritis of cervical spine 03/01/2016  . Strain of left trapezius muscle 10/21/2015  . Synovial cyst of lumbar spine 07/03/2014  . Synovial cyst of lumbar facet joint 07/02/2014  . Peripheral vascular disease, unspecified ([place]) 12/02/2013  . Essential tremor 10/14/2013  . Hoarseness of voice 04/12/2013  . Nasal septal deviation 07/16/2012  . Arch pain 05/09/2012  . Claudication in peripheral vascular disease ([place]) 11/21/2011  . Constipation 03/09/2011  . Hearing impairment 03/09/2011  . Allergic rhinitis 02/02/2011  . Hip pain, chronic 06/08/2010  . Tobacco use disorder, severe, in sustained remission 03/18/2010   . BPH with obstruction/lower urinary tract symptoms 06/17/2008  . ANOMALY, CONGENITAL HYPOSPADIAS 04/23/2007  . HYPERLIPIDEMIA 10/11/2006  . HYPERTENSION, BENIGN SYSTEMIC 10/11/2006  . HEMORRHOIDS, NOS 10/11/2006  . COPD with emphysema ([place]) 10/11/2006  . PSORIASIS 10/11/2006  . GAIT, ABNORMAL 10/11/2006   Starr Lake PT, DPT, LAT, ATC  03/29/16  4:00 PM      [facility] [address] [place], [place], [address] Phone: [phone]   Fax:  [phone]  Name: Jeffrey Meadows MRN: [id number] Date of Birth: 03/27/1933

## 2016-03-31 ENCOUNTER — Ambulatory Visit: Payer: Medicare Other | Admitting: Physical Therapy

## 2016-03-31 DIAGNOSIS — R29898 Other symptoms and signs involving the musculoskeletal system: Secondary | ICD-10-CM

## 2016-03-31 DIAGNOSIS — M542 Cervicalgia: Secondary | ICD-10-CM | POA: Diagnosis not present

## 2016-03-31 DIAGNOSIS — M62838 Other muscle spasm: Secondary | ICD-10-CM

## 2016-03-31 NOTE — Therapy (Signed)
[place], [place], [address] Phone: [phone]   Fax:  [phone]  Physical Therapy Treatment  Patient Details  Name: Jeffrey Meadows MRN: [id number] Date of Birth: Jan 30, 1933 Referring Provider: Stefanie Libel MD  Encounter Date: 03/31/2016      PT End of Session - 03/31/16 1034    Visit Number 4   Number of Visits 13   Date for PT Re-Evaluation 05/03/16   Authorization Type Medicare: kx mod by 15 visit, progress not by 10th visit   PT Start Time 0930   PT Stop Time 1014   PT Time Calculation (min) 44 min   Activity Tolerance Patient tolerated treatment well   Behavior During Therapy [facility] for tasks assessed/performed      Past Medical History:  Diagnosis Date  . Adenomatous polyp of colon 08/2001   removed hepatic flexure  . Arthritis   . Cataract   . Chronic back pain    stenosis/scoliosis/synovial cyst  . Emphysema lung ([place])   . Enlarged prostate    takes Proscar daily  . History of blood transfusion    no abnormal reaction noted  . History of bronchitis    about 3-75yrs ago   . Hyperlipidemia    takes Atorvastatin daily  . Hypertension    takes Amlodipine and HCTZ daily  . Joint pain   . Joint swelling   . On home oxygen therapy    at night  . Raynaud disease   . Urinary frequency    takes Flomax daily  . Villous adenoma of colon 11/.2001   2 cm removed cecum    Past Surgical History:  Procedure Laterality Date  . cataract surgery    . CYSTOSCOPY WITH LITHOLAPAXY N/A 01/11/2016   Procedure: CYSTOSCOPY WITH LITHOLAPAXY;  Surgeon: Festus Aloe, MD;  [facility];  Service: Urology;  Laterality: N/A;  . GREEN LIGHT LASER TURP (TRANSURETHRAL RESECTION OF PROSTATE N/A 01/11/2016   Procedure: GREEN LIGHT LASER TURP (TRANSURETHRAL RESECTION OF PROSTATE;  Surgeon: Festus Aloe, MD;  [facility];  Service: Urology;  Laterality: N/A;  . HERNIA REPAIR Left    inguinal  .  HOLMIUM LASER APPLICATION N/A [id number]   Procedure: HOLMIUM LASER APPLICATION;  Surgeon: Festus Aloe, MD;  [facility];  Service: Urology;  Laterality: N/A;  . JOINT REPLACEMENT     Right Hip  . LAMINECTOMY Right 07/02/2014   Procedure: Right Lumbar two-three Laminectomy for Synovial Cyst;  Surgeon: Erline Levine, MD;  Location: [place] [facility];  Service: Neurosurgery;  Laterality: Right;  . POPLITEAL VENOUS ANEURYSM REPAIR Left 2010   about 6 yrs ago  . PROSTATE BIOPSY  4/95  . SPINE SURGERY  Jan. 2016   Dr. Vickie Epley  . TONSILLECTOMY    . TOTAL HIP ARTHROPLASTY  1952   right    There were no vitals filed for this visit.      Subjective Assessment - 03/31/16 1032    Subjective Patient has no complaints at this time. He reports it is sore sometimes but he " states just at time" whenasked when. He reports no pain this morning.    Pertinent History THA    Limitations Walking;Lifting   How long can you sit comfortably? unlimited   How long can you stand comfortably? 45 min   How long can you walk comfortably? 20 min   Diagnostic tests x-ray on 03/08/2016   Currently in Pain? Yes   Pain Score 0-No  pain   Pain Location Neck   Pain Orientation Left   Pain Descriptors / Indicators Tightness   Pain Type Chronic pain   Pain Onset More than a month ago   Pain Frequency Constant   Aggravating Factors  heavey lifting,   Pain Relieving Factors tylenol                                  PT Education - 03/31/16 1033    Education provided Yes   Education Details reviewed symptom mangement and theory behind soft tissue mobilization    Person(s) Educated Patient   Methods Explanation;Handout;Verbal cues;Demonstration   Comprehension Verbalized understanding;Verbal cues required;Returned demonstration          PT Short Term Goals - 03/29/16 1547      PT SHORT TERM GOAL #1   Title pt will be I with inital HEP (04/12/2016)   Time 3   Period Weeks    Status Achieved     PT SHORT TERM GOAL #2   Title pt will be able to verbalize and demo proper posture and lifting/ carrying mechanics to prevent and reduce neck/ shoulder pain (04/12/2016)   Time 3   Period Weeks   Status On-going     PT SHORT TERM GOAL #3   Title pt will demonstrate decrease muscle tightness in the L upper trap and surrounding musclature to reduce pain to </= 2/10 pain and promote cervical ROM (04/12/2016)   Time 3   Period Weeks   Status On-going     PT SHORT TERM GOAL #4   Title assess shoulder mobility and strength and make goals    Baseline WFL no goals necessary   Period Weeks   Status Achieved           PT Long Term Goals - 03/22/16 1307      PT LONG TERM GOAL #1   Title pt will be I with all HEP given throughout therapy as of last visit (05/03/2016)   Time 6   Period Weeks   Status New     PT LONG TERM GOAL #2   Title pt will improve cervical rotation/ side bending bil by >/= 8 degrees with </= 2/10 pain to assist with safety during driving and ADLS ([id number])    Time 6   Period Weeks   Status New     PT LONG TERM GOAL #3   Title pt will be able to lift and carrying >/= 25# with </= 2/10 pain to assist with ADLs including carrying firewood per pt's personal goal (05/03/2016)   Baseline reported lifting firewood caused pain    Time 6   Period Weeks   Status New     PT LONG TERM GOAL #4   Title Increase FOTO score by >/= 5 points to demontrate improvement in function (05/03/2016)   Time 6   Period Weeks   Status New               Plan - 03/31/16 1034    Clinical Impression Statement Patient continues to have spamsing of the left upper trap but he reported no significant pain with treatment or activity. He reports" pressure at end range with rotatiton. He did not reach any new ggoals today but he is progressing towards strength and motion goals.    Rehab Potential Good   PT Frequency 2x / week   PT Duration 6 weeks  PT  Treatment/Interventions ADLs/Self Care Home Management;Cryotherapy;Electrical Stimulation;Iontophoresis 4mg /ml Dexamethasone;Taping;Dry needling;Manual techniques;Moist Heat;Ultrasound;Gait training;Patient/family education;Manual lymph drainage;Passive range of motion;Therapeutic exercise;Therapeutic activities   PT Next Visit Plan Cervical stabilization,  stretches,  consider decompression and scapular stabilization.    PT Home Exercise Plan thoracic extension over chair   Consulted and Agree with Plan of Care Patient      Patient will benefit from skilled therapeutic intervention in order to improve the following deficits and impairments:  Pain, Improper body mechanics, Postural dysfunction, Hypomobility, Decreased strength, Decreased mobility, Decreased endurance, Decreased activity tolerance, Decreased balance, Increased fascial restricitons, Decreased range of motion  Visit Diagnosis: Cervicalgia  Other muscle spasm  Other symptoms and signs involving the musculoskeletal system     Problem List Patient Active Problem List   Diagnosis Date Noted  . Osteoarthritis of cervical spine 03/01/2016  . Strain of left trapezius muscle 10/21/2015  . Synovial cyst of lumbar spine 07/03/2014  . Synovial cyst of lumbar facet joint 07/02/2014  . Peripheral vascular disease, unspecified ([place]) 12/02/2013  . Essential tremor 10/14/2013  . Hoarseness of voice 04/12/2013  . Nasal septal deviation 07/16/2012  . Arch pain 05/09/2012  . Claudication in peripheral vascular disease ([place]) 11/21/2011  . Constipation 03/09/2011  . Hearing impairment 03/09/2011  . Allergic rhinitis 02/02/2011  . Hip pain, chronic 06/08/2010  . Tobacco use disorder, severe, in sustained remission 03/18/2010  . BPH with obstruction/lower urinary tract symptoms 06/17/2008  . ANOMALY, CONGENITAL HYPOSPADIAS 04/23/2007  . HYPERLIPIDEMIA 10/11/2006  . HYPERTENSION, BENIGN SYSTEMIC 10/11/2006  . HEMORRHOIDS, NOS  10/11/2006  . COPD with emphysema ([place]) 10/11/2006  . PSORIASIS 10/11/2006  . GAIT, ABNORMAL 10/11/2006    Carney Living PT DPT  03/31/2016, 10:37 AM  [facility] [address] [place], [place], [address] Phone: [phone]   Fax:  [phone]  Name: Jeffrey Meadows MRN: [id number] Date of Birth: 02/19/1933

## 2016-04-03 ENCOUNTER — Ambulatory Visit: Payer: Medicare Other | Admitting: Physical Therapy

## 2016-04-03 DIAGNOSIS — M542 Cervicalgia: Secondary | ICD-10-CM

## 2016-04-03 DIAGNOSIS — M62838 Other muscle spasm: Secondary | ICD-10-CM

## 2016-04-03 DIAGNOSIS — R29898 Other symptoms and signs involving the musculoskeletal system: Secondary | ICD-10-CM

## 2016-04-03 NOTE — Therapy (Signed)
[place], [place], [address] Phone: [phone]   Fax:  [phone]  Physical Therapy Treatment  Patient Details  Name: Jeffrey Meadows MRN: [id number] Date of Birth: 05/06/1933 Referring Provider: Stefanie Libel MD  Encounter Date: 04/03/2016      PT End of Session - 04/03/16 1509    Visit Number 5   Number of Visits 13   Date for PT Re-Evaluation 05/03/16   PT Start Time 1501   PT Stop Time 1542   PT Time Calculation (min) 41 min   Activity Tolerance Patient tolerated treatment well   Behavior During Therapy [facility] for tasks assessed/performed      Past Medical History:  Diagnosis Date  . Adenomatous polyp of colon 08/2001   removed hepatic flexure  . Arthritis   . Cataract   . Chronic back pain    stenosis/scoliosis/synovial cyst  . Emphysema lung ([place])   . Enlarged prostate    takes Proscar daily  . History of blood transfusion    no abnormal reaction noted  . History of bronchitis    about 3-5yrs ago   . Hyperlipidemia    takes Atorvastatin daily  . Hypertension    takes Amlodipine and HCTZ daily  . Joint pain   . Joint swelling   . On home oxygen therapy    at night  . Raynaud disease   . Urinary frequency    takes Flomax daily  . Villous adenoma of colon 11/.2001   2 cm removed cecum    Past Surgical History:  Procedure Laterality Date  . cataract surgery    . CYSTOSCOPY WITH LITHOLAPAXY N/A 01/11/2016   Procedure: CYSTOSCOPY WITH LITHOLAPAXY;  Surgeon: Festus Aloe, MD;  [facility];  Service: Urology;  Laterality: N/A;  . GREEN LIGHT LASER TURP (TRANSURETHRAL RESECTION OF PROSTATE N/A 01/11/2016   Procedure: GREEN LIGHT LASER TURP (TRANSURETHRAL RESECTION OF PROSTATE;  Surgeon: Festus Aloe, MD;  [facility];  Service: Urology;  Laterality: N/A;  . HERNIA REPAIR Left    inguinal  . HOLMIUM LASER APPLICATION N/A [id number]   Procedure: HOLMIUM LASER  APPLICATION;  Surgeon: Festus Aloe, MD;  [facility];  Service: Urology;  Laterality: N/A;  . JOINT REPLACEMENT     Right Hip  . LAMINECTOMY Right 07/02/2014   Procedure: Right Lumbar two-three Laminectomy for Synovial Cyst;  Surgeon: Erline Levine, MD;  Location: [place] [facility];  Service: Neurosurgery;  Laterality: Right;  . POPLITEAL VENOUS ANEURYSM REPAIR Left 2010   about 6 yrs ago  . PROSTATE BIOPSY  4/95  . SPINE SURGERY  Jan. 2016   Dr. Vickie Epley  . TONSILLECTOMY    . TOTAL HIP ARTHROPLASTY  1952   right    There were no vitals filed for this visit.      Subjective Assessment - 04/03/16 1505    Subjective "The exercises are going good, some soreness today"    Currently in Pain? No/denies            [facility] PT Assessment - 04/03/16 0001      Observation/Other Assessments   Focus on Therapeutic Outcomes (FOTO)  31% limited                     [facility] Adult PT Treatment/Exercise - 04/03/16 1515      Neck Exercises: Machines for Strengthening   UBE (Upper Arm Bike) L1.5 x 6 min  changing direction at 3  min     Neck Exercises: Standing   Neck Retraction 10 reps;5 secs  with multi-angle to the R/ L     Neck Exercises: Seated   Money 10 reps  x 2 sets with blue theraband      Neck Exercises: Sidelying   Other Sidelying Exercise book opening 2 x 10 bil      Neck Exercises: Stretches   Upper Trapezius Stretch 2 reps;30 seconds   Levator Stretch 2 reps;30 seconds                PT Education - 04/03/16 1535    Education provided Yes   Education Details thoracic mobility exercise added to HEP   Person(s) Educated Patient   Methods Explanation;Demonstration;Verbal cues;Handout   Comprehension Verbalized understanding;Verbal cues required          PT Short Term Goals - 03/29/16 1547      PT SHORT TERM GOAL #1   Title pt will be I with inital HEP (04/12/2016)   Time 3   Period Weeks   Status Achieved     PT SHORT TERM GOAL #2    Title pt will be able to verbalize and demo proper posture and lifting/ carrying mechanics to prevent and reduce neck/ shoulder pain (04/12/2016)   Time 3   Period Weeks   Status On-going     PT SHORT TERM GOAL #3   Title pt will demonstrate decrease muscle tightness in the L upper trap and surrounding musclature to reduce pain to </= 2/10 pain and promote cervical ROM (04/12/2016)   Time 3   Period Weeks   Status On-going     PT SHORT TERM GOAL #4   Title assess shoulder mobility and strength and make goals    Baseline WFL no goals necessary   Period Weeks   Status Achieved           PT Long Term Goals - 03/22/16 1307      PT LONG TERM GOAL #1   Title pt will be I with all HEP given throughout therapy as of last visit (05/03/2016)   Time 6   Period Weeks   Status New     PT LONG TERM GOAL #2   Title pt will improve cervical rotation/ side bending bil by >/= 8 degrees with </= 2/10 pain to assist with safety during driving and ADLS ([id number])    Time 6   Period Weeks   Status New     PT LONG TERM GOAL #3   Title pt will be able to lift and carrying >/= 25# with </= 2/10 pain to assist with ADLs including carrying firewood per pt's personal goal (05/03/2016)   Baseline reported lifting firewood caused pain    Time 6   Period Weeks   Status New     PT LONG TERM GOAL #4   Title Increase FOTO score by >/= 5 points to demontrate improvement in function (05/03/2016)   Time 6   Period Weeks   Status New               Plan - 04/03/16 1547    Clinical Impression Statement [name] continues to report no pain just intermittent tightness at end range rotation. Focused cervical stability exercises and thoracic mobility exercises which he did well with. continues to report no pain during or following exercise or activity.    PT Next Visit Plan Cervical stabilization,  stretches,  consider decompression and scapular stabilization.  thoracic mobility, exercises to  promote posture,    PT Home Exercise Plan thoracic rotation   Consulted and Agree with Plan of Care Patient      Patient will benefit from skilled therapeutic intervention in order to improve the following deficits and impairments:  Pain, Improper body mechanics, Postural dysfunction, Hypomobility, Decreased strength, Decreased mobility, Decreased endurance, Decreased activity tolerance, Decreased balance, Increased fascial restricitons, Decreased range of motion  Visit Diagnosis: Cervicalgia  Other muscle spasm  Other symptoms and signs involving the musculoskeletal system     Problem List Patient Active Problem List   Diagnosis Date Noted  . Osteoarthritis of cervical spine 03/01/2016  . Strain of left trapezius muscle 10/21/2015  . Synovial cyst of lumbar spine 07/03/2014  . Synovial cyst of lumbar facet joint 07/02/2014  . Peripheral vascular disease, unspecified ([place]) 12/02/2013  . Essential tremor 10/14/2013  . Hoarseness of voice 04/12/2013  . Nasal septal deviation 07/16/2012  . Arch pain 05/09/2012  . Claudication in peripheral vascular disease ([place]) 11/21/2011  . Constipation 03/09/2011  . Hearing impairment 03/09/2011  . Allergic rhinitis 02/02/2011  . Hip pain, chronic 06/08/2010  . Tobacco use disorder, severe, in sustained remission 03/18/2010  . BPH with obstruction/lower urinary tract symptoms 06/17/2008  . ANOMALY, CONGENITAL HYPOSPADIAS 04/23/2007  . HYPERLIPIDEMIA 10/11/2006  . HYPERTENSION, BENIGN SYSTEMIC 10/11/2006  . HEMORRHOIDS, NOS 10/11/2006  . COPD with emphysema ([place]) 10/11/2006  . PSORIASIS 10/11/2006  . GAIT, ABNORMAL 10/11/2006   Starr Lake PT, DPT, LAT, ATC  04/03/16  3:50 PM      [facility] [address] [place], [place], [address] Phone: [phone]   Fax:  [phone]  Name: Jeffrey Meadows MRN: [id number] Date of Birth: 1932/11/27

## 2016-04-05 ENCOUNTER — Ambulatory Visit: Payer: Medicare Other | Admitting: Physical Therapy

## 2016-04-05 ENCOUNTER — Encounter: Payer: Medicare Other | Admitting: Physical Therapy

## 2016-04-05 DIAGNOSIS — M542 Cervicalgia: Secondary | ICD-10-CM | POA: Diagnosis not present

## 2016-04-05 DIAGNOSIS — M62838 Other muscle spasm: Secondary | ICD-10-CM

## 2016-04-05 DIAGNOSIS — R29898 Other symptoms and signs involving the musculoskeletal system: Secondary | ICD-10-CM

## 2016-04-05 NOTE — Therapy (Signed)
[place], [place], [address] Phone: [phone]   Fax:  [phone]  Physical Therapy Treatment  Patient Details  Name: Jeffrey Meadows MRN: [id number] Date of Birth: 08/20/1932 Referring Provider: Stefanie Libel MD  Encounter Date: 04/05/2016      PT End of Session - 04/05/16 1059    Visit Number 6   Number of Visits 13   Date for PT Re-Evaluation 05/03/16   Authorization Type Medicare: kx mod by 15 visit, progress not by 10th visit   PT Start Time 0933   PT Stop Time 1016   PT Time Calculation (min) 43 min   Activity Tolerance Patient tolerated treatment well   Behavior During Therapy [facility] for tasks assessed/performed      Past Medical History:  Diagnosis Date  . Adenomatous polyp of colon 08/2001   removed hepatic flexure  . Arthritis   . Cataract   . Chronic back pain    stenosis/scoliosis/synovial cyst  . Emphysema lung ([place])   . Enlarged prostate    takes Proscar daily  . History of blood transfusion    no abnormal reaction noted  . History of bronchitis    about 3-33yrs ago   . Hyperlipidemia    takes Atorvastatin daily  . Hypertension    takes Amlodipine and HCTZ daily  . Joint pain   . Joint swelling   . On home oxygen therapy    at night  . Raynaud disease   . Urinary frequency    takes Flomax daily  . Villous adenoma of colon 11/.2001   2 cm removed cecum    Past Surgical History:  Procedure Laterality Date  . cataract surgery    . CYSTOSCOPY WITH LITHOLAPAXY N/A 01/11/2016   Procedure: CYSTOSCOPY WITH LITHOLAPAXY;  Surgeon: Festus Aloe, MD;  Location: [facility];  Service: Urology;  Laterality: N/A;  . GREEN LIGHT LASER TURP (TRANSURETHRAL RESECTION OF PROSTATE N/A 01/11/2016   Procedure: GREEN LIGHT LASER TURP (TRANSURETHRAL RESECTION OF PROSTATE;  Surgeon: Festus Aloe, MD;  Location: [facility];  Service: Urology;  Laterality: N/A;  . HERNIA REPAIR Left    inguinal  .  HOLMIUM LASER APPLICATION N/A [id number]   Procedure: HOLMIUM LASER APPLICATION;  Surgeon: Festus Aloe, MD;  Location: [facility];  Service: Urology;  Laterality: N/A;  . JOINT REPLACEMENT     Right Hip  . LAMINECTOMY Right 07/02/2014   Procedure: Right Lumbar two-three Laminectomy for Synovial Cyst;  Surgeon: Erline Levine, MD;  Location: [facility];  Service: Neurosurgery;  Laterality: Right;  . POPLITEAL VENOUS ANEURYSM REPAIR Left 2010   about 6 yrs ago  . PROSTATE BIOPSY  4/95  . SPINE SURGERY  Jan. 2016   Dr. Vickie Epley  . TONSILLECTOMY    . TOTAL HIP ARTHROPLASTY  1952   right    There were no vitals filed for this visit.      Subjective Assessment - 04/05/16 0941    Subjective "I am feeling alittle sore today, from doing home repair"    Currently in Pain? Yes   Pain Score 1    Pain Orientation Left   Pain Descriptors / Indicators Tightness;Aching;Sore   Pain Type Chronic pain   Pain Onset More than a month ago   Pain Frequency Constant                         [facility] Adult PT Treatment/Exercise - 04/05/16 [id number]  Neck Exercises: Machines for Strengthening   UBE (Upper Arm Bike) L1.5 x 8 min  changing direction at 4 min     Neck Exercises: Seated   Money 10 reps   X 2 sets green theraband given as HEP     Neck Exercises: Sidelying   Other Sidelying Exercise book opening 2 x 10 bil      Shoulder Exercises: Seated   Other Seated Exercises thoracic extension over back of chair 2 x 10     Neck Exercises: Stretches   Upper Trapezius Stretch 2 reps;30 seconds   Levator Stretch 2 reps;30 seconds                PT Education - 04/05/16 1059    Education provided Yes   Education Details reviewed and updated HEP    Person(s) Educated Patient   Methods Explanation;Handout   Comprehension Verbalized understanding          PT Short Term Goals - 03/29/16 1547      PT SHORT TERM GOAL #1   Title pt will be I with inital HEP (04/12/2016)    Time 3   Period Weeks   Status Achieved     PT SHORT TERM GOAL #2   Title pt will be able to verbalize and demo proper posture and lifting/ carrying mechanics to prevent and reduce neck/ shoulder pain (04/12/2016)   Time 3   Period Weeks   Status On-going     PT SHORT TERM GOAL #3   Title pt will demonstrate decrease muscle tightness in the L upper trap and surrounding musclature to reduce pain to </= 2/10 pain and promote cervical ROM (04/12/2016)   Time 3   Period Weeks   Status On-going     PT SHORT TERM GOAL #4   Title assess shoulder mobility and strength and make goals    Baseline WFL no goals necessary   Period Weeks   Status Achieved           PT Long Term Goals - 03/22/16 1307      PT LONG TERM GOAL #1   Title pt will be I with all HEP given throughout therapy as of last visit (05/03/2016)   Time 6   Period Weeks   Status New     PT LONG TERM GOAL #2   Title pt will improve cervical rotation/ side bending bil by >/= 8 degrees with </= 2/10 pain to assist with safety during driving and ADLS ([id number])    Time 6   Period Weeks   Status New     PT LONG TERM GOAL #3   Title pt will be able to lift and carrying >/= 25# with </= 2/10 pain to assist with ADLs including carrying firewood per pt's personal goal (05/03/2016)   Baseline reported lifting firewood caused pain    Time 6   Period Weeks   Status New     PT LONG TERM GOAL #4   Title Increase FOTO score by >/= 5 points to demontrate improvement in function (05/03/2016)   Time 6   Period Weeks   Status New               Plan - 04/05/16 1059    Clinical Impression Statement Mr. Chappel reported some increaesed soreness from some work around the house today. Focued on thoracic moblity and stretching in combination with scapular stabilzer strengthening which he performed well. pt reported decreased tightness and soreness post session.  PT Next Visit Plan Cervical stabilization,  stretches,   consider decompression and scapular stabilization. thoracic mobility, exercises to promote posture, review goals, ROM,    PT Home Exercise Plan shoulder retraction with ER   Consulted and Agree with Plan of Care Patient      Patient will benefit from skilled therapeutic intervention in order to improve the following deficits and impairments:  Pain, Improper body mechanics, Postural dysfunction, Hypomobility, Decreased strength, Decreased mobility, Decreased endurance, Decreased activity tolerance, Decreased balance, Increased fascial restricitons, Decreased range of motion  Visit Diagnosis: Cervicalgia  Other muscle spasm  Other symptoms and signs involving the musculoskeletal system     Problem List Patient Active Problem List   Diagnosis Date Noted  . Osteoarthritis of cervical spine 03/01/2016  . Strain of left trapezius muscle 10/21/2015  . Synovial cyst of lumbar spine 07/03/2014  . Synovial cyst of lumbar facet joint 07/02/2014  . Peripheral vascular disease, unspecified ([place]) 12/02/2013  . Essential tremor 10/14/2013  . Hoarseness of voice 04/12/2013  . Nasal septal deviation 07/16/2012  . Arch pain 05/09/2012  . Claudication in peripheral vascular disease ([place]) 11/21/2011  . Constipation 03/09/2011  . Hearing impairment 03/09/2011  . Allergic rhinitis 02/02/2011  . Hip pain, chronic 06/08/2010  . Tobacco use disorder, severe, in sustained remission 03/18/2010  . BPH with obstruction/lower urinary tract symptoms 06/17/2008  . ANOMALY, CONGENITAL HYPOSPADIAS 04/23/2007  . HYPERLIPIDEMIA 10/11/2006  . HYPERTENSION, BENIGN SYSTEMIC 10/11/2006  . HEMORRHOIDS, NOS 10/11/2006  . COPD with emphysema ([place]) 10/11/2006  . PSORIASIS 10/11/2006  . GAIT, ABNORMAL 10/11/2006   Starr Lake PT, DPT, LAT, ATC  04/05/16  11:02 AM      [facility] [address] [place], [place], [address] Phone: [phone]   Fax:   [phone]  Name: Jeffrey Meadows MRN: [id number] Date of Birth: 01/05/1933

## 2016-04-11 ENCOUNTER — Ambulatory Visit: Payer: Medicare Other | Admitting: Physical Therapy

## 2016-04-11 DIAGNOSIS — M542 Cervicalgia: Secondary | ICD-10-CM | POA: Diagnosis not present

## 2016-04-11 DIAGNOSIS — R29898 Other symptoms and signs involving the musculoskeletal system: Secondary | ICD-10-CM

## 2016-04-11 DIAGNOSIS — M62838 Other muscle spasm: Secondary | ICD-10-CM

## 2016-04-11 NOTE — Therapy (Addendum)
[place], [place], [address] Phone: [phone]   Fax:  [phone]  Physical Therapy Treatment / Discharge Note Patient Details  Name: Jeffrey Meadows MRN: [id number] Date of Birth: 01/08/1933 Referring Provider: Stefanie Libel MD  Encounter Date: 04/11/2016      PT End of Session - 04/11/16 1231    Visit Number 7   Number of Visits 13   Date for PT Re-Evaluation 05/03/16   Authorization Type Medicare: kx mod by 15 visit, progress not by 10th visit   PT Start Time 1145   PT Stop Time 1225   PT Time Calculation (min) 40 min   Activity Tolerance Patient tolerated treatment well   Behavior During Therapy [facility] for tasks assessed/performed      Past Medical History:  Diagnosis Date  . Adenomatous polyp of colon 08/2001   removed hepatic flexure  . Arthritis   . Cataract   . Chronic back pain    stenosis/scoliosis/synovial cyst  . Emphysema lung ([place])   . Enlarged prostate    takes Proscar daily  . History of blood transfusion    no abnormal reaction noted  . History of bronchitis    about 3-57yr ago   . Hyperlipidemia    takes Atorvastatin daily  . Hypertension    takes Amlodipine and HCTZ daily  . Joint pain   . Joint swelling   . On home oxygen therapy    at night  . Raynaud disease   . Urinary frequency    takes Flomax daily  . Villous adenoma of colon 11/.2001   2 cm removed cecum    Past Surgical History:  Procedure Laterality Date  . cataract surgery    . CYSTOSCOPY WITH LITHOLAPAXY N/A 01/11/2016   Procedure: CYSTOSCOPY WITH LITHOLAPAXY;  Surgeon: MFestus Aloe MD;  [facility];  Service: Urology;  Laterality: N/A;  . GREEN LIGHT LASER TURP (TRANSURETHRAL RESECTION OF PROSTATE N/A 01/11/2016   Procedure: GREEN LIGHT LASER TURP (TRANSURETHRAL RESECTION OF PROSTATE;  Surgeon: MFestus Aloe MD;  [facility];  Service: Urology;  Laterality: N/A;  . HERNIA REPAIR Left     inguinal  . HOLMIUM LASER APPLICATION N/A 53/61/4431  Procedure: HOLMIUM LASER APPLICATION;  Surgeon: MFestus Aloe MD;  [facility];  Service: Urology;  Laterality: N/A;  . JOINT REPLACEMENT     Right Hip  . LAMINECTOMY Right 07/02/2014   Procedure: Right Lumbar two-three Laminectomy for Synovial Cyst;  Surgeon: JErline Levine MD;  [facility];  Service: Neurosurgery;  Laterality: Right;  . POPLITEAL VENOUS ANEURYSM REPAIR Left 2010   about 6 yrs ago  . PROSTATE BIOPSY  4/95  . SPINE SURGERY  Jan. 2016   Dr. JVickie Epley . TONSILLECTOMY    . TOTAL HIP ARTHROPLASTY  1952   right    There were no vitals filed for this visit.      Subjective Assessment - 04/11/16 1150    Subjective "I am doing pretty good, no pain"    Currently in Pain? No/denies            [facility] - 04[facility] 115[facility]      Observation/Other Assessments   Focus on Therapeutic Outcomes (FOTO)  27% limited     AROM   Cervical Flexion 60   Cervical Extension 44   Cervical - Right Side Bend 30   Cervical - Left Side Bend 30   Cervical - Right  Rotation 62   Cervical - Left Rotation 62     Strength   Left Shoulder ABduction 5/5   Left Shoulder Internal Rotation 5/5   Left Shoulder External Rotation 4+/5                     [facility] Adult PT Treatment/Exercise - 04/11/16 0001      Neck Exercises: Standing   Other Standing Exercises functional lifting from floor <>waist 2 x 10   1 set with 13#, 1 set with 38#      Neck Exercises: Seated   Neck Retraction 10 reps  required verbal, tactile and visual cues for form   Other Seated Exercise thoracic rotation bil/ extension 2 x 10 with physioball,   to relieve sidelying soreness on hips.     Neck Exercises: Stretches   Upper Trapezius Stretch 2 reps;30 seconds   Levator Stretch 2 reps;30 seconds                PT Education - 04/11/16 1230    Education provided Yes   Education Details reviewed HEP and pt  reported difficulty with chin tucks, demonstrated and reprinted new HEP for chin tucks for better example. Lifting and carrying mechanics avoiding hiking shoulders with lifting and carrying.    Person(s) Educated Patient   Methods Explanation;Demonstration;Tactile cues;Verbal cues;Handout   Comprehension Verbalized understanding;Verbal cues required;Tactile cues required;Need further instruction;Returned demonstration          PT Short Term Goals - 04/11/16 1155      PT SHORT TERM GOAL #1   Title pt will be I with inital HEP (04/12/2016)   Time 3   Period Weeks   Status Achieved     PT SHORT TERM GOAL #2   Title pt will be able to verbalize and demo proper posture and lifting/ carrying mechanics to prevent and reduce neck/ shoulder pain (04/12/2016)   Time 3   Period Weeks   Status Achieved     PT SHORT TERM GOAL #3   Title pt will demonstrate decrease muscle tightness in the L upper trap and surrounding musclature to reduce pain to </= 2/10 pain and promote cervical ROM (04/12/2016)   Time 3   Period Weeks   Status Achieved           PT Long Term Goals - 04/11/16 1156      PT LONG TERM GOAL #1   Title pt will be I with all HEP given throughout therapy as of last visit (05/03/2016)   Time 6   Period Weeks   Status Achieved     PT LONG TERM GOAL #2   Title pt will improve cervical rotation/ side bending bil by >/= 8 degrees with </= 2/10 pain to assist with safety during driving and ADLS (4/62/7035)    Time 6   Period Weeks   Status Achieved     PT LONG TERM GOAL #3   Title pt will be able to lift and carrying >/= 25# with </= 2/10 pain to assist with ADLs including carrying firewood per pt's personal goal (05/03/2016)   Time 6   Period Weeks   Status Achieved     PT LONG TERM GOAL #4   Title Increase FOTO score by >/= 5 points to demontrate improvement in function (05/03/2016)   Time 6   Period Weeks   Status Achieved               Plan -  05-05-16 1236     Clinical Impression Statement [name] has made great progress with physical therapy reporting no pain in the neck/ shoulder stating only tightness at end range rotation. He met all goals today and reports he is able to maintain and progress his current level of function independently and will be discharged from PT today.    PT Next Visit Plan discharged from PT   PT Home Exercise Plan chin tucks   Consulted and Agree with Plan of Care Patient      Patient will benefit from skilled therapeutic intervention in order to improve the following deficits and impairments:  Pain, Improper body mechanics, Postural dysfunction, Hypomobility, Decreased strength, Decreased mobility, Decreased endurance, Decreased activity tolerance, Decreased balance, Increased fascial restricitons, Decreased range of motion  Visit Diagnosis: Cervicalgia  Other muscle spasm  Other symptoms and signs involving the musculoskeletal system       G-Codes - 05/05/2016 1353    Functional Assessment Tool Used FOTO/ clinical judgement   Functional Limitation Changing and maintaining body position   Changing and Maintaining Body Position Goal Status (M8413) At least 1 percent but less than 20 percent impaired, limited or restricted   Changing and Maintaining Body Position Discharge Status (K4401) At least 1 percent but less than 20 percent impaired, limited or restricted      Problem List Patient Active Problem List   Diagnosis Date Noted  . Osteoarthritis of cervical spine 03/01/2016  . Strain of left trapezius muscle 10/21/2015  . Synovial cyst of lumbar spine 07/03/2014  . Synovial cyst of lumbar facet joint 07/02/2014  . Peripheral vascular disease, unspecified ([place]) 12/02/2013  . Essential tremor 10/14/2013  . Hoarseness of voice 04/12/2013  . Nasal septal deviation 07/16/2012  . Arch pain 05/09/2012  . Claudication in peripheral vascular disease ([place]) 11/21/2011  . Constipation 03/09/2011  . Hearing  impairment 03/09/2011  . Allergic rhinitis 02/02/2011  . Hip pain, chronic 06/08/2010  . Tobacco use disorder, severe, in sustained remission 03/18/2010  . BPH with obstruction/lower urinary tract symptoms 06/17/2008  . ANOMALY, CONGENITAL HYPOSPADIAS 04/23/2007  . HYPERLIPIDEMIA 10/11/2006  . HYPERTENSION, BENIGN SYSTEMIC 10/11/2006  . HEMORRHOIDS, NOS 10/11/2006  . COPD with emphysema ([place]) 10/11/2006  . PSORIASIS 10/11/2006  . GAIT, ABNORMAL 10/11/2006   Starr Lake PT, DPT, LAT, ATC  2016/05/05  1:53 PM      [facility] [address] [place], [place], [address] Phone: [phone]   Fax:  [phone]  Name: Jeffrey Meadows MRN: [id number] Date of Birth: [birth year]    PHYSICAL THERAPY DISCHARGE SUMMARY  Visits from Start of Care: 7  Current functional level related to goals / functional outcomes: FOTO 27% limitation   Remaining deficits: Intermittent tightness in bil upper traps which pt reports is controlled with stretching and manual trigger point release.    Education / Equipment: HEP, posture education, lifting/ carrying mechanics, theraband for strengthening.   Plan: Patient agrees to discharge.  Patient goals were met. Patient is being discharged due to meeting the stated rehab goals.  ?????

## 2016-04-13 ENCOUNTER — Encounter: Payer: Medicare Other | Admitting: Physical Therapy

## 2016-04-18 ENCOUNTER — Encounter: Payer: Medicare Other | Admitting: Physical Therapy

## 2016-04-20 ENCOUNTER — Encounter: Payer: Medicare Other | Admitting: Physical Therapy

## 2016-05-08 ENCOUNTER — Ambulatory Visit: Payer: Medicare Other | Admitting: Internal Medicine

## 2016-05-10 ENCOUNTER — Other Ambulatory Visit: Payer: Self-pay | Admitting: Internal Medicine

## 2016-05-12 ENCOUNTER — Telehealth: Payer: Self-pay | Admitting: Internal Medicine

## 2016-05-12 MED ORDER — AZITHROMYCIN 250 MG PO TABS: ORAL_TABLET | ORAL | 0 refills | Status: DC

## 2016-05-12 NOTE — Telephone Encounter (Signed)
LMOMTCB x 1 

## 2016-05-12 NOTE — Telephone Encounter (Signed)
Called spoke with [name]. She reports pt will finish ZPAK today. Pt is still having chest congestion, malaise, SOB no worse. Pt had sore throat yesterday that went away. Denies any cough, no wheezing/chest tightness.  Requesting another ZPAK Pt does have prednisone 10 mg taper on hand. Wants to know if they should start this as well.  Please advise Dr. Annamaria Boots thanks  Allergies  Allergen Reactions  . Antihistamines, Diphenhydramine-Type Other (See Comments)    Enlarged prostate     Current Outpatient Prescriptions on File Prior to Visit  Medication Sig Dispense Refill  . acetaminophen (TYLENOL) 500 MG tablet Take 500 mg by mouth 2 (two) times daily.    Marland Kitchen ADVAIR DISKUS 250-50 MCG/DOSE AEPB INL 1 PUFF PO BID  5  . albuterol (PROAIR HFA) 108 (90 BASE) MCG/ACT inhaler Inhale 1-2 puffs into the lungs every 4 (four) hours as needed. as needed shortness of breath prior to exercise 1 Inhaler 11  . amLODipine (NORVASC) 10 MG tablet TAKE 1 TABLET BY MOUTH DAILY 90 tablet 3  . atorvastatin (LIPITOR) 20 MG tablet TAKE 1 TABLET BY MOUTH DAILY AT 6PM 90 tablet 3  . benzonatate (TESSALON) 200 MG capsule TAKE 1 CAPSULE BY MOUTH THREE TIMES DAILY AS NEEDED FOR COUGH 30 capsule 0  . calcipotriene (DOVONOX) 0.005 % ointment Apply 1 application topically 2 (two) times daily. Applies to affected area.  3  . Cholecalciferol (VITAMIN D) 2000 UNITS CAPS Take 2,000 Units by mouth daily.     . finasteride (PROSCAR) 5 MG tablet TK 1 T PO D  11  . hydrochlorothiazide (HYDRODIURIL) 25 MG tablet TAKE 1 TABLET BY MOUTH EVERY MORNING 90 tablet 3  . Multiple Vitamin (MULTIVITAMIN WITH MINERALS) TABS tablet Take 1 tablet by mouth daily.    . nitrofurantoin, macrocrystal-monohydrate, (MACROBID) 100 MG capsule Take 1 capsule (100 mg total) by mouth at bedtime. 7 capsule 0  . Omega-3 Fatty Acids (FISH OIL PO) Take 2,000 Units by mouth daily.     . potassium chloride (K-DUR) 10 MEQ tablet Take 1 tablet (10 mEq total) by mouth  once. (Patient taking differently: Take 10 mEq by mouth daily. ) 90 tablet 1  . tamsulosin (FLOMAX) 0.4 MG CAPS capsule TK 2 CS PO ONCE D  11  . Tiotropium Bromide-Olodaterol (STIOLTO RESPIMAT) 2.5-2.5 MCG/ACT AERS Inhale 2 puffs into the lungs daily. 1 Inhaler 0  . traMADol (ULTRAM) 50 MG tablet TK 1 T PO Q 6 H PRN FOR MODERATE PAIN  0   No current facility-administered medications on file prior to visit.

## 2016-05-12 NOTE — Telephone Encounter (Signed)
Called spoke with pt's wife. Informed her of CY's recs and verified pharmacy as [facility] on [address]. Rx sent. Nothing further needed.

## 2016-05-12 NOTE — Telephone Encounter (Signed)
Ok to refill Z pak. Unless he gets worse with more short of breath and wheeze, I would suggest he try without prednisone, at least through the weekend, and see how he does without it.

## 2016-05-25 ENCOUNTER — Ambulatory Visit (INDEPENDENT_AMBULATORY_CARE_PROVIDER_SITE_OTHER): Payer: Medicare Other | Admitting: Internal Medicine

## 2016-05-25 DIAGNOSIS — Z23 Encounter for immunization: Secondary | ICD-10-CM

## 2016-05-26 DIAGNOSIS — Z23 Encounter for immunization: Secondary | ICD-10-CM | POA: Diagnosis not present

## 2016-05-29 ENCOUNTER — Other Ambulatory Visit: Payer: Self-pay | Admitting: Internal Medicine

## 2016-05-31 NOTE — Progress Notes (Signed)
Flu vaccine left deltoid given

## 2016-06-01 ENCOUNTER — Telehealth: Payer: Self-pay | Admitting: Internal Medicine

## 2016-06-01 NOTE — Telephone Encounter (Signed)
Wife calling for patient to inquire about Vitamin B6.  Wants to know how much B6 he should be taking daily?  Dr. Renold Genta states she didn't know the patient was deficient in Vitamin B6.  Wife states that he has been taking this for quite some time.  Wants to know how much he should take daily.  She states that he takes 100mg  daily.  Dr. Renold Genta states that this is sufficient.    Wife verbalized understanding of these instructions.

## 2016-06-29 ENCOUNTER — Other Ambulatory Visit: Payer: Self-pay | Admitting: *Deleted

## 2016-06-29 ENCOUNTER — Ambulatory Visit
Admission: RE | Admit: 2016-06-29 | Discharge: 2016-06-29 | Disposition: A | Discharge status: Home / Self Care | Payer: Medicare Other | Source: Ambulatory Visit | Attending: Sports Medicine | Admitting: Sports Medicine

## 2016-06-29 ENCOUNTER — Ambulatory Visit (INDEPENDENT_AMBULATORY_CARE_PROVIDER_SITE_OTHER): Payer: Medicare Other | Admitting: Sports Medicine

## 2016-06-29 VITALS — BP 161/65 | HR 75 | Ht 68.0 in | Wt 150.0 lb

## 2016-06-29 DIAGNOSIS — M545 Low back pain, unspecified: Secondary | ICD-10-CM

## 2016-06-29 DIAGNOSIS — S32010A Wedge compression fracture of first lumbar vertebra, initial encounter for closed fracture: Secondary | ICD-10-CM

## 2016-06-29 DIAGNOSIS — F1020 Alcohol dependence, uncomplicated: Secondary | ICD-10-CM | POA: Diagnosis not present

## 2016-06-29 DIAGNOSIS — I739 Peripheral vascular disease, unspecified: Secondary | ICD-10-CM

## 2016-06-29 NOTE — Assessment & Plan Note (Signed)
Must control drinking level  D/W patient caution about interactions with medicines/ excess sedation Risk of falling

## 2016-06-29 NOTE — Assessment & Plan Note (Signed)
Needs to avoid lifting Safe position Fall prevention Tums 1000 qid -- 1600 mg Ca per day

## 2016-06-29 NOTE — Progress Notes (Signed)
  Jeffrey Meadows - 82 y.o. male MRN [id number]  Date of birth: 17-Apr-1933  SUBJECTIVE:  Including CC & ROS.   Jeffrey Meadows is a 82 y.o. M who presents for evaluation of low back pain.  Patient was drinking wine and taking a new medication [place] Nail for psoriasis) and felt as if he "lost control of motor function." on 11/14 and fell twice at home.  He reports falling backwards and landing on low back and then hitting back of head on rug in bedroom.  He then tried to get up and fell the same way again.  He thinks that he may have "blacked out" later when getting into bed.  He had some bruising soon afterwards, as well as abrasions and contusions over his arms.  He does not take any blood thinners, other than occasional aspirin.  He denies headaches, numbness, tingling, weakness, or vision changes since fall.  Back pain is worse with movement.  HISTORY: Past Medical, Surgical, Social, and Family History Reviewed & Updated per EMR.   Pertinent Historical Findings include: PMSHx - BPH on tamsulosin, HTN on HCTZ and amlodipine PSHx -  Former smoker, drinks alcohol regularly, chronic alcoholism in control over several years FHx -  noncontributory Medications - notably - Vit D, HCTZ, amlodipine, Lipitor, tamsulosin  DATA REVIEWED: none  PHYSICAL EXAM:  VS: BP:(!) 161/65  HR:75bpm  TEMP: ( )  RESP:   HT:5\' 8"  (172.7 cm)   WT:150 lb (68 kg)  BMI:22.9 PHYSICAL EXAM: Gen: NAD, alert, cooperative with exam, well-appearing Mental status is normal with no memory loss or change of speech HEENT: clear conjunctiva, EOMI CV:  no edema, capillary refill brisk  Resp: non-labored, CTAB, normal speech Skin: no rashes, frail skin with multiple abrasions over arms Neuro: no gross deficits.  Psych:  alert and oriented  Back: TTP over spinous processes of lower T spine and L spine, more tender in midline than laterally, step-off deformity of low T spine, kyphoscoliosis present Strength and sensation intact  in LEs Edema present over step-off deformity  XRay:  Diffuse DDD/ mild lumbar scoliosis/  Degenerative spurring New compression fracture of 20% at L1  ASSESSMENT & PLAN:   Acute midline low back pain without sciatica Tenderness, swelling, and step-off deformity present after fall and trauma to T/L spine Get XRays to assess for compression fracture Signs of osteoporosis present on exam Start Calcium supplementation with 4 Tums daily (1000mg  of elemental Ca) in addition to Vit D Can use heat or ice for comfort Can use tylenol prn pain control   Virginia Crews, MD, MPH PGY-3,  [facility] 06/29/2016 10:29 AM   I observed and examined the patient with the resident and agree with assessment and plan.  Note reviewed and modified by me. Stefanie Libel, MD

## 2016-06-29 NOTE — Assessment & Plan Note (Signed)
Tenderness, swelling, and step-off deformity present after fall and trauma to T/L spine Get XRays to assess for compression fracture Signs of osteoporosis present on exam Start Calcium supplementation with 4 Tums daily (1000mg  of elemental Ca) in addition to Vit D Can use heat or ice for comfort Can use tylenol prn pain control

## 2016-07-01 ENCOUNTER — Encounter: Payer: Self-pay | Admitting: Internal Medicine

## 2016-07-01 ENCOUNTER — Telehealth: Payer: Self-pay | Admitting: Internal Medicine

## 2016-07-01 DIAGNOSIS — S32010B Wedge compression fracture of first lumbar vertebra, initial encounter for open fracture: Secondary | ICD-10-CM

## 2016-07-01 MED ORDER — HYDROCODONE-ACETAMINOPHEN 10-325 MG PO TABS: ORAL_TABLET | ORAL | 0 refills | Status: DC

## 2016-07-01 NOTE — Telephone Encounter (Signed)
Recent fall at home with resultant low back pain. Xray revealed new 20% compression fracture. at L1. Needs pain management. Rx for Norco 10/325  #60 one half to one po q 6-8 hours prn pain

## 2016-07-03 ENCOUNTER — Telehealth: Payer: Self-pay | Admitting: Internal Medicine

## 2016-07-03 ENCOUNTER — Encounter: Payer: Self-pay | Admitting: Internal Medicine

## 2016-07-03 ENCOUNTER — Other Ambulatory Visit: Payer: Self-pay | Admitting: Internal Medicine

## 2016-07-03 DIAGNOSIS — S32010B Wedge compression fracture of first lumbar vertebra, initial encounter for open fracture: Secondary | ICD-10-CM

## 2016-07-03 NOTE — Telephone Encounter (Signed)
Wife says that patient is still having a considerable amount of pain despite taking Norco 10/325. Also having some dizziness with this medication.  We will ask radiology to see if he is a candidate for vertebroplasty.

## 2016-07-04 ENCOUNTER — Other Ambulatory Visit: Payer: Self-pay | Admitting: Internal Medicine

## 2016-07-04 ENCOUNTER — Telehealth: Payer: Self-pay | Admitting: Internal Medicine

## 2016-07-04 DIAGNOSIS — S32010B Wedge compression fracture of first lumbar vertebra, initial encounter for open fracture: Secondary | ICD-10-CM

## 2016-07-04 NOTE — Telephone Encounter (Signed)
On Monday evening, 11/20, spoke with Angelita Ingles and [place] and faxed over documentation to her attention for a Vertebroplasty.  She scheduled and called patient's wife, [name] and scheduled consultation for 07/13/16.  Wife called this morning and was upset that the consultation was 11/30 and potential vertebroplasty was not to be done until 07/27/16.  She said this was just too long due to the pain that the patient is in and he is having to take too many pain pills.  She's concerned in this regard.  Advised I will speak with Dr. Renold Genta and get back to her.    Dr. Renold Genta advised to contact [facility] and let them know that patient is in severe pain, due to his age, number of pain medication that he's having to take, is there any way to have this consultation and procedure moved up any sooner?    Called Roberta back at [facility]; she was able to schedule consultation for 11/28 with Dr. Jobe Igo; patient to arrive at 9:15 a.m. At [facility].  No restrictions or prep needed for consultation.  Patient tentatively scheduled for the procedure of Vertebroplasty with Dr. Jola Baptist on 12/5 pending the ok by Dr. Jobe Igo on 11/28.  Called patient's wife, [name] back @ [phone] and advised that we were able to move everything around a little.  Patient's wife was thrilled with the change.    Patient's wife was instructed to call if there is any immediate change in pain or if they need anything prior to 11/28.

## 2016-07-05 ENCOUNTER — Other Ambulatory Visit: Payer: Self-pay | Admitting: Internal Medicine

## 2016-07-05 ENCOUNTER — Telehealth: Payer: Self-pay | Admitting: Internal Medicine

## 2016-07-05 ENCOUNTER — Ambulatory Visit
Admission: RE | Admit: 2016-07-05 | Discharge: 2016-07-05 | Disposition: A | Discharge status: Home / Self Care | Payer: Medicare Other | Source: Ambulatory Visit | Attending: Internal Medicine | Admitting: Internal Medicine

## 2016-07-05 DIAGNOSIS — S32010G Wedge compression fracture of first lumbar vertebra, subsequent encounter for fracture with delayed healing: Secondary | ICD-10-CM

## 2016-07-05 DIAGNOSIS — S32000G Wedge compression fracture of unspecified lumbar vertebra, subsequent encounter for fracture with delayed healing: Secondary | ICD-10-CM

## 2016-07-05 DIAGNOSIS — S32009A Unspecified fracture of unspecified lumbar vertebra, initial encounter for closed fracture: Secondary | ICD-10-CM

## 2016-07-05 NOTE — Telephone Encounter (Signed)
Call from [place] @ [facility] this morning; states that Radiologist states that he cannot use the MRI from 2015; it is too old (for criteria for the Vertebroplasty).  Patient must have either an MRI or CT Scan.  They will not accept the xray from Dr. Oneida Alar, it must be an MRI or CT Scan.    Patient must also have a CBC w/diff prior to Vertebroplasty (must be within the last 60 day).  Last CBC was done 12/2015.  So, we will have to draw on patient.    Patient can have walk-in CT Scan at [facility] today.  He has been scheduled for this by [place].  Patient will have to have Bone Scan by Nuclear Medicine at [facility].  Spoke with [employer] @ [place] @ [place] @ [phone].  She is taking verbal order and we faxed written order to [phone] for Nuclear Medicine Bone Scan r/t Compression Fracture to L1.  Patient is scheduled for Friday, 11/24 @ 9:00 (arrive 8:45) for injection.  Will go back for scan at 12:00.  Patient to arrive at main entrance to [facility] ([place] parking) and they will direct them to Radiology.    Patient will keep appointment for consult on Tuesday, 11/28 and still potential appointment on 12/5 for Vertebroplasty.    Spoke with patient's wife to make her aware of all of these appointments today.  At first, patient's wife was not sure she could get patient to agree to do all of this due to the pain that he is in and the timing of everything.  Advised wife that if patient did not do CT Scan today and Bone Scan on Friday, 11/24 that he would not be able to have the consult on Tuesday, 11/28.  Wife then stated that they would definitely keep all of these appointments.    Patient will have follow up appointment here with Dr. Renold Genta on Wednesday, 11/29 @ 11:00 with CBC w/diff.  Called Angelita Ingles back @ [phone] to advise that all of this has been set up, patient notified, etc.

## 2016-07-07 ENCOUNTER — Encounter (HOSPITAL_COMMUNITY)
Admission: RE | Admit: 2016-07-07 | Discharge: 2016-07-07 | Disposition: A | Discharge status: Home / Self Care | Payer: Medicare Other | Source: Ambulatory Visit | Attending: Internal Medicine | Admitting: Internal Medicine

## 2016-07-07 DIAGNOSIS — S32009A Unspecified fracture of unspecified lumbar vertebra, initial encounter for closed fracture: Secondary | ICD-10-CM | POA: Diagnosis present

## 2016-07-07 DIAGNOSIS — X58XXXA Exposure to other specified factors, initial encounter: Secondary | ICD-10-CM | POA: Insufficient documentation

## 2016-07-07 MED ORDER — TECHNETIUM TC 99M MEDRONATE IV KIT
25.0000 | PACK | Freq: Once | INTRAVENOUS | Status: AC | PRN
  Administered 2016-07-07: 21.1 via INTRAVENOUS

## 2016-07-10 ENCOUNTER — Telehealth: Payer: Self-pay | Admitting: Internal Medicine

## 2016-07-10 MED ORDER — POLYETHYLENE GLYCOL 3350 17 GM/SCOOP PO POWD: 17.0000 g | Freq: Two times a day (BID) | ORAL | 11 refills | Status: DC | PRN

## 2016-07-10 MED ORDER — ATORVASTATIN CALCIUM 20 MG PO TABS: ORAL_TABLET | ORAL | 3 refills | Status: DC

## 2016-07-10 NOTE — Telephone Encounter (Signed)
It is available OTC- am not aware that it needs to be prescribed. Please speak with pharmacist. Jeffrey Meadows in 8 oz water daily

## 2016-07-10 NOTE — Telephone Encounter (Signed)
Please do this for a year

## 2016-07-10 NOTE — Telephone Encounter (Signed)
Patient would like an rx for miralax.  He is using this everyday.

## 2016-07-10 NOTE — Telephone Encounter (Signed)
Patients wife wants a script so insurance can pay for it and it be cheaper per her pharmacist.

## 2016-07-11 ENCOUNTER — Ambulatory Visit
Admission: RE | Admit: 2016-07-11 | Discharge: 2016-07-11 | Disposition: A | Discharge status: Home / Self Care | Payer: Medicare Other | Source: Ambulatory Visit | Attending: Internal Medicine | Admitting: Internal Medicine

## 2016-07-11 DIAGNOSIS — S32010B Wedge compression fracture of first lumbar vertebra, initial encounter for open fracture: Secondary | ICD-10-CM

## 2016-07-11 NOTE — Final Consult Note (Signed)
Chief Complaint: Jeffrey Meadows was seen in consultation today for L1 Compression Fracture  Referring Physician(s): Baxley,Mary J   History of Present Illness: Jeffrey Meadows is a 82 y.o. male retired [place] [employer] of thorocolumbar pain after a fall 06/27/2016.  The patient became dizzy and fell on a rug in his bedroom.  He then fell again trying to get up.  Subsequent evaluation demonstrated an L1 compression fracture with approximately 20% loss of height.  He is able to perform activities of daily living without assistance.  He does have increased pain when bending over or trying to pick something up from the floor.  He is able walk without assistance.  The patient is able to control pain with tramadol all but is complaining of significant constipation.  He reports some improvement in the pain since the initial fall.  He is now taking tramadol twice daily.  He was previously using tramadol 3 times daily.  He reports his pain as 6-7/10 on a visual analogue scale.  A Roland Morris disability questionnaire was administered.  He scored 17/24.  Past Medical History:  Diagnosis Date  . Adenomatous polyp of colon 08/2001   removed hepatic flexure  . Arthritis   . Cataract   . Chronic back pain    stenosis/scoliosis/synovial cyst  . Emphysema lung ([place])   . Enlarged prostate    takes Proscar daily  . History of blood transfusion    no abnormal reaction noted  . History of bronchitis    about 3-51yrs ago   . Hyperlipidemia    takes Atorvastatin daily  . Hypertension    takes Amlodipine and HCTZ daily  . Joint pain   . Joint swelling   . On home oxygen therapy    at night  . Raynaud disease   . Urinary frequency    takes Flomax daily  . Villous adenoma of colon 11/.2001   2 cm removed cecum    Past Surgical History:  Procedure Laterality Date  . cataract surgery    . CYSTOSCOPY WITH LITHOLAPAXY N/A 01/11/2016   Procedure: CYSTOSCOPY WITH LITHOLAPAXY;   Surgeon: Festus Aloe, MD;  Location: [facility];  Service: Urology;  Laterality: N/A;  . GREEN LIGHT LASER TURP (TRANSURETHRAL RESECTION OF PROSTATE N/A 01/11/2016   Procedure: GREEN LIGHT LASER TURP (TRANSURETHRAL RESECTION OF PROSTATE;  Surgeon: Festus Aloe, MD;  Location: [facility];  Service: Urology;  Laterality: N/A;  . HERNIA REPAIR Left    inguinal  . HOLMIUM LASER APPLICATION N/A [id number]   Procedure: HOLMIUM LASER APPLICATION;  Surgeon: Festus Aloe, MD;  Location: [facility];  Service: Urology;  Laterality: N/A;  . JOINT REPLACEMENT     Right Hip  . LAMINECTOMY Right 07/02/2014   Procedure: Right Lumbar two-three Laminectomy for Synovial Cyst;  Surgeon: Erline Levine, MD;  Location: [facility];  Service: Neurosurgery;  Laterality: Right;  . POPLITEAL VENOUS ANEURYSM REPAIR Left 2010   about 6 yrs ago  . PROSTATE BIOPSY  4/95  . SPINE SURGERY  Jan. 2016   Dr. Vickie Epley  . TONSILLECTOMY    . TOTAL HIP ARTHROPLASTY  1952   right    Allergies: Antihistamines, diphenhydramine-type  Medications: Prior to Admission medications   Medication Sig Start Date End Date Taking? Authorizing Provider  acetaminophen (TYLENOL) 500 MG tablet Take 500 mg by mouth 2 (two) times daily.   Yes Historical Provider, MD  ADVAIR DISKUS 250-50 MCG/DOSE AEPB INL 1 PUFF PO BID 01/13/16  Yes Historical Provider, MD  albuterol (PROAIR HFA) 108 (90 BASE) MCG/ACT inhaler Inhale 1-2 puffs into the lungs every 4 (four) hours as needed. as needed shortness of breath prior to exercise 03/27/11  Yes Candelaria Celeste, MD  amLODipine (NORVASC) 10 MG tablet TAKE 1 TABLET BY MOUTH DAILY 12/31/15  Yes [name], MD  atorvastatin (LIPITOR) 20 MG tablet TAKE 1 TABLET BY MOUTH DAILY AT [facility] 07/10/16  Yes [name], MD  calcipotriene (DOVONOX) 0.005 % ointment Apply 1 application topically 2 (two) times daily. Applies to affected area. 11/11/15  Yes Historical Provider, MD  Cholecalciferol (VITAMIN D) 2000 UNITS CAPS Take  2,000 Units by mouth daily.    Yes Historical Provider, MD  hydrochlorothiazide (HYDRODIURIL) 25 MG tablet TAKE 1 TABLET BY MOUTH EVERY MORNING 11/15/15  Yes [name], MD  HYDROcodone-acetaminophen [facility]) 10-325 MG tablet One half to one tab po q 6-8 hours prn compression fracture back pain. Take with food. 07/01/16  Yes [name], MD  Multiple Vitamin (MULTIVITAMIN WITH MINERALS) TABS tablet Take 1 tablet by mouth daily.   Yes Historical Provider, MD  Omega-3 Fatty Acids (FISH OIL PO) Take 2,000 Units by mouth daily.    Yes Historical Provider, MD  polyethylene glycol powder (GLYCOLAX/MIRALAX) powder Take 17 g by mouth 2 (two) times daily as needed. 07/10/16  Yes [name], MD  potassium chloride (K-DUR) 10 MEQ tablet TAKE 1 TABLET(10 MEQ) BY MOUTH 1 TIME 05/29/16  Yes [name], MD  senna (SENOKOT) 8.6 MG tablet Take 1 tablet by mouth daily.   Yes Historical Provider, MD  tamsulosin (FLOMAX) 0.4 MG CAPS capsule TK 2 CS PO ONCE D 12/12/15  Yes Historical Provider, MD  Tiotropium Bromide-Olodaterol (STIOLTO RESPIMAT) 2.5-2.5 MCG/ACT AERS Inhale 2 puffs into the lungs daily. 02/02/16  Yes Deneise Lever, MD     Family History  Problem Relation Age of Onset  . Heart failure Mother   . Heart disease Mother   . Aneurysm Father     Social History   Social History  . Marital status: Married    Spouse name: [name]  . Number of children: 1  . Years of education: 5   Occupational History  . 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  Retired   Social History Main Topics  . Smoking status: Former Smoker    Packs/day: 1.50    Years: 40.00    Types: Cigarettes  . Smokeless tobacco: Never Used     Comment: quit smoking about 52yrs ago  . Alcohol use Yes     Comment: 3-5 glasses of wine daily  . Drug use: No  . Sexual activity: Not Currently   Other Topics Concern  . 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   Exercise: Pt reports exercising 4 days a week for 60 minutes.   Seatbelts: Pt reports wearing seatbelt when in vehicles.    Sun Exposure/Protection: Pt reports not using sun protection.   Hobbies: reading, writing, music  Review of Systems: A 12 point ROS discussed and pertinent positives are indicated in the HPI above.  All other systems are negative.  Review of Systems  Respiratory: Positive for shortness of breath.   Gastrointestinal: Positive for nausea.    Vital Signs: BP 119/70   Pulse 75   Temp 97.7 F (36.5 C)   Resp 14   SpO2 92%   Physical Exam  Constitutional: He appears well-developed and well-nourished.  Musculoskeletal:       Thoracic back: He exhibits bony tenderness.  Neurological: He has normal strength.  Normal Gait     Imaging: Dg Thoracolumabar Spine  Result Date: 06/29/2016 CLINICAL DATA:  Status post fall backwards 2 days ago with persistent mid back pain with limited range of motion EXAM: THORACOLUMBAR SPINE - 2 VIEW COMPARISON:  Chest x-ray of August 04, 2015 and coronal and sagittal CT images from a scan of Dec 20, 2015 FINDINGS: There is new wedge compression of the body of L1 with loss of height anteriorly of 20%. The lower thoracic vertebral bodies are preserved in height. There is stable levocurvature of the lumbar spine centered at L2-3. There is multilevel degenerative disc disease of the lumbar spine. There is calcification in the wall of the abdominal aorta. IMPRESSION: New 20% anterior wedge compression of L1 has occurred since the Dec 20, 2015 CT scan. Multilevel degenerative disc disease of the lumbar spine. Multilevel endplate spurring of the lower thoracic spine. Abdominal aortic atherosclerosis. Electronically  Signed   By: David  Martinique M.D.   On: 06/29/2016 13:43   Ct Lumbar Spine Wo Contrast  Result Date: 07/05/2016 CLINICAL DATA:  82 year old male with a history of compression fracture L1 EXAM: CT LUMBAR SPINE WITHOUT CONTRAST TECHNIQUE: Multidetector CT imaging of the lumbar spine was performed without intravenous contrast administration. Multiplanar CT image reconstructions were also generated. COMPARISON:  CT abdomen 12/20/2015, x-ray 06/29/2016 FINDINGS: Segmentation: No segmentation anomaly. Five lumbar type vertebral bodies. Alignment: Similar configuration of the alignment compared to the prior CT of the abdomen. Retrolisthesis of L2 on L3 secondary to degenerative disc disease measuring approximately 6 mm -7 mm. Trace retrolisthesis of L3 on L4 secondary to degenerative disc disease approximately 4 mm. Grade 1 anterolisthesis of L5 on S1. Vertebrae: CT demonstration of the L1 compression fracture identified on prior plain film, with fracture line involving the anterior endplate. Less than 30% height loss anteriorly with no retropulsion of posterior fracture fragment. Fracture line appears to extend from the superior aspect of the anterior endplate through the inferior lateral right vertebral body. Fracture line does not appear to extend into the pedicles. Diffuse osteopenia. No additional fracture line. Paraspinal and other soft tissues: Calcifications of the abdominal aorta. Calcifications involving the origin of all the mesenteric vasculature in the renal arteries. 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 Calcifications of the iliac arteries. Disc levels: T12-L1: No significant canal narrowing. No evidence of significant neural foraminal narrowing. Degenerative disc disease. L1-L2: Broad-based disc bulge with no significant bony canal narrowing or neural foraminal narrowing. L2-L3: Right greater than left neural foraminal narrowing  secondary to disc space loss and retrolisthesis. L3-L4: Broad-based disc bulge, disc space loss contributes to right greater than left neural foraminal narrowing. Canal narrowing likely present secondary to the disc bulge and retrolisthesis. L4-L5: Disc space loss and disc bulge contributes to bilateral neural foraminal narrowing. L5-S1: Grade 1 anterolisthesis, disc space loss and broad-based disc bulge contributes to bilateral  neural foraminal narrowing. IMPRESSION: CT demonstration of known L1 compression fracture with less than 30% anterior vertebral body height loss. Fracture line involves the anterior endplate and extends into the right anterior inferior aspect of the vertebral body. No retropulsion of the posterior endplate. Advanced degenerative disc disease with varying degrees of bilateral neural foraminal narrowing, greatest on the right at L2-L3. Aortic atherosclerosis. Additional findings as above. Signed, Dulcy Fanny. Earleen Newport, DO [facility] Electronically Signed   By: Corrie Mckusick D.O.   On: 07/05/2016 13:23   Nm Bone Scan Whole Body  Result Date: 07/07/2016 CLINICAL DATA:  Previous right hip arthroplasty, L1 compression fracture EXAM: NUCLEAR MEDICINE WHOLE BODY BONE SCAN TECHNIQUE: Whole body anterior and posterior images were obtained approximately 3 hours after intravenous injection of radiopharmaceutical. RADIOPHARMACEUTICALS:  21.1 mCi Technetium-79m MDP IV COMPARISON:  CT lumbar spine 07/05/2016 FINDINGS: A whole-body bone scan was performed. There is focal increased activity L1 vertebral body consistent with compression deformity as noted on CT scan. Mild increased activity L3 vertebral body probable degenerative in nature. Mild increased activity lower cervical spine and right shoulder probable degenerative in nature. IMPRESSION: Focal increased activity L1 vertebral body consistent with compression fracture as noted on CT scan. Mild  increased activity L3 vertebral body and lower cervical spine probable degenerative in nature. Electronically Signed   By: Lahoma Crocker M.D.   On: 07/07/2016 12:02    Labs:  CBC:  Recent Labs  10/22/15 0912 12/28/15 1500  WBC 6.6 10.1  HGB 15.6 15.1  HCT 45.1 43.6  PLT 258 303    COAGS: No results for input(s): INR, APTT in the last 8760 hours.  BMP:  Recent Labs  10/22/15 0912 12/28/15 1500  NA 144 140  K 3.9 3.6  CL 103 103  CO2 28 30  GLUCOSE 83 90  BUN 10 11  CALCIUM 9.6 10.2  CREATININE 0.82 0.85  GFRNONAA 84 >60  GFRAA >89 >60    LIVER FUNCTION TESTS:  Recent Labs  10/22/15 0912  BILITOT 0.5  AST 21  ALT 19  ALKPHOS 68  PROT 6.4  ALBUMIN 4.0    TUMOR MARKERS: No results for input(s): AFPTM, CEA, CA199, CHROMGRNA in the last 8760 hours.  Assessment and Plan:  L1 osteoporotic compression fracture with 20% loss of height superiorly.  Fusion across the T12-L1 disc space may confer stability to this fracture.    The patient reports improvement in pain over the past 2 weeks of this fracture and slight decrease in use of pain medication.  He had is no significant limitations to activities of daily living although he does have significant pain.    Given the fairly rapid course of improvement, we discussed continued observation versus is intervention with vertebroplasty at this time.  We discussed the risks and benefits of both options.  After thorough discussion and answering all questions, the patient chooses to continue conservative treatment the fracture without vertebroplasty.    I have arranged a follow-up appointment with him for 07/27/16.  If the patient is not significantly improving at that time, vertebral augmentation could be considered.  If his pain gets worse in the mean time, I also advised him to call our office for earlier assessment.    The patient and his wife are in agreement with this plan.   Thank you for this interesting  consult.  I greatly enjoyed meeting Jeffrey Meadows and look forward to participating in his care.  A  copy of this report was sent to the requesting provider on this date.  Electronically Signed: Edgerrin Correia W 07/11/2016, 1:08 PM   I spent a total of  15 Minutes  in face to face in clinical consultation, greater than 50% of which was counseling/coordinating care for Jeffrey Jeffrey Meadows

## 2016-07-12 ENCOUNTER — Ambulatory Visit (INDEPENDENT_AMBULATORY_CARE_PROVIDER_SITE_OTHER): Payer: Medicare Other | Admitting: Internal Medicine

## 2016-07-12 ENCOUNTER — Encounter: Payer: Self-pay | Admitting: Internal Medicine

## 2016-07-12 VITALS — HR 105 | Temp 98.1°F | Ht 68.0 in | Wt 148.0 lb

## 2016-07-12 DIAGNOSIS — S32010A Wedge compression fracture of first lumbar vertebra, initial encounter for closed fracture: Secondary | ICD-10-CM

## 2016-07-12 DIAGNOSIS — F329 Major depressive disorder, single episode, unspecified: Secondary | ICD-10-CM

## 2016-07-12 DIAGNOSIS — F32A Depression, unspecified: Secondary | ICD-10-CM

## 2016-07-12 DIAGNOSIS — M8580 Other specified disorders of bone density and structure, unspecified site: Secondary | ICD-10-CM

## 2016-07-12 LAB — CBC WITH DIFFERENTIAL/PLATELET
Basophils Absolute: 0 cells/uL (ref 0–200)
Basophils Relative: 0 %
Eosinophils Absolute: 0 cells/uL — ABNORMAL LOW (ref 15–500)
Eosinophils Relative: 0 %
HCT: 46.1 % (ref 38.5–50.0)
Hemoglobin: 16 g/dL (ref 13.2–17.1)
Lymphocytes Relative: 22 %
Lymphs Abs: 1870 cells/uL (ref 850–3900)
MCH: 32.4 pg (ref 27.0–33.0)
MCHC: 34.7 g/dL (ref 32.0–36.0)
MCV: 93.3 fL (ref 80.0–100.0)
MPV: 9.4 fL (ref 7.5–12.5)
Monocytes Absolute: 595 cells/uL (ref 200–950)
Monocytes Relative: 7 %
Neutro Abs: 6035 cells/uL (ref 1500–7800)
Neutrophils Relative %: 71 %
Platelets: 324 10*3/uL (ref 140–400)
RBC: 4.94 MIL/uL (ref 4.20–5.80)
RDW: 12.8 % (ref 11.0–15.0)
WBC: 8.5 10*3/uL (ref 3.8–10.8)

## 2016-07-12 LAB — COMPLETE METABOLIC PANEL WITH GFR
ALT: 17 U/L (ref 9–46)
AST: 16 U/L (ref 10–35)
Albumin: 4.2 g/dL (ref 3.6–5.1)
Alkaline Phosphatase: 108 U/L (ref 40–115)
BUN: 17 mg/dL (ref 7–25)
CO2: 27 mmol/L (ref 20–31)
Calcium: 10 mg/dL (ref 8.6–10.3)
Chloride: 103 mmol/L (ref 98–110)
Creat: 0.98 mg/dL (ref 0.70–1.11)
GFR, Est African American: 83 mL/min (ref >=60)
GFR, Est Non African American: 72 mL/min (ref >=60)
Glucose, Bld: 118 mg/dL — ABNORMAL HIGH (ref 65–99)
Potassium: 3.5 mmol/L (ref 3.5–5.3)
Sodium: 140 mmol/L (ref 135–146)
Total Bilirubin: 0.8 mg/dL (ref 0.2–1.2)
Total Protein: 6.4 g/dL (ref 6.1–8.1)

## 2016-07-12 NOTE — Patient Instructions (Addendum)
To have vertebroplasty next week. CBC with differential drawn in anticipation of the procedure. Continue tramadol and Tylenol. No alcohol within 12 hours of taking tramadol. Limit alcohol to one glass of wine with lunch and dinner. To have bone density study. Continue vitamin D supplementation. We'll not start antidepressant at this point in time

## 2016-07-12 NOTE — Progress Notes (Addendum)
   Subjective:    Patient ID: Jeffrey Meadows, male    DOB: Mar 18, 1933, 82 y.o.   MRN: [id number]  HPI Patient here for follow-up on L1 compression fracture. We received call from his wife on November 18 that he was having severe pain. He had seen Dr. Andreas Blower on November 16 and was diagnosed with new compression fracture L1. He had had a couple of falls at home on November 14. With the initial fall, he landed on his low back and hit the back of his hand on a rug in the bedroom. He tried to get up and then fell again.  He recently saw Dr. Jobe Igo to discuss possibility of vertebral plasty. Hydrocodone/APAP was prescribed for pain after received phone call from his wife on November 18. He also has some tramadol for pain as well. He's been taking Tylenol 2 also.  Wife is concerned that he's been drinking more recently. He's been depressed. Some of their friends have passed away. He also feels that starting [place] for psoriasis caused some depression issues.  We discussed antidepressant therapy today. Were going to hold off on that for now. We discussed alcohol consumption. Wife says he had been sneaking some brandy after dinner prior to the fall.  Wife for like him to have vertebroplasty.  He's ambulating slowly today and is lethargic. He is usually more upbeat.    Review of Systems see above     Objective:   Physical Exam Spent 25 minutes speaking with patient and wife about these issues. Reviewed recent bone scan report showing focal increase activity L1 vertebral body. Reviewed CT of the LS spine without contrast demonstrating L1 compression fracture with fracture line involving anterior endplate. Fracture line does not extend into the pedicles. Less than 30% height loss anteriorly. He does have anterior listhesis L5 on S1.  Diffuse osteopenia noted. He had vitamin D level checked spring 2017 which was normal. We will order a bone density study.       Assessment & Plan:  L1  compression fracture secondary to fall  Osteopenia  Alcohol issues addressed  Depression  Plan: Patient will not drink alcohol within 12 hours of taking tramadol. He is to limit alcohol consumption to 13 ounce glass of wine with lunch and dinner. Speak with Radiology about vertebroplasty. He will have bone density study. He will continue vitamin D supplementation. He is not really taking any hydrocodone/APAP at this point  just tramadol and Tylenol. Spoke with patient and wife about depression. We will not start antidepressant at this point in time  CBC with differential drawn in anticipation of vertebroplasty.

## 2016-07-13 ENCOUNTER — Other Ambulatory Visit: Payer: Medicare Other

## 2016-07-14 ENCOUNTER — Telehealth: Payer: Self-pay | Admitting: Internal Medicine

## 2016-07-14 NOTE — Telephone Encounter (Signed)
Please call mrs. Kohlbeck back. I have done what I thought was best and spent considerable time on this matter. They will need to decide if they want to go forward.

## 2016-07-14 NOTE — Telephone Encounter (Signed)
Patient's wife, [name] is calling; states that they have sited a study in the [place] 6-8 years ago similar to what the patient is scheduled to have on Tuesday (Vertebroplasty), 12/5.  She states that the article states that "it just didn't work".  Furthermore, she wants to know what you "think" of this finding?  And, do you feel there is any validity to this finding and they are again questioning moving forward.  It seems patient wants guarantee that it will work.  Advised that we cannot guarantee that it will work.    Advised that I would pass message along to Dr. Renold Genta.

## 2016-07-14 NOTE — Telephone Encounter (Signed)
Advised Mrs Belmont that we have done what we think is best for the patient.  Mrs Kolkman understands and says he is going to have the procedure.

## 2016-07-17 MED ORDER — CEFAZOLIN SODIUM-DEXTROSE 2-4 GM/100ML-% IV SOLN
2.0000 g | Freq: Once | INTRAVENOUS | Status: AC
  Administered 2016-07-18: 2 g via INTRAVENOUS

## 2016-07-17 MED ORDER — SODIUM CHLORIDE 0.9 % IV SOLN
Freq: Once | INTRAVENOUS | Status: AC
  Administered 2016-07-18: 07:00:00 via INTRAVENOUS

## 2016-07-17 MED ORDER — MIDAZOLAM HCL 2 MG/2ML IJ SOLN
1.0000 mg | INTRAMUSCULAR | Status: DC | PRN
  Administered 2016-07-18 (×2): 0.5 mg via INTRAVENOUS

## 2016-07-17 MED ORDER — FENTANYL CITRATE (PF) 100 MCG/2ML IJ SOLN
25.0000 ug | INTRAMUSCULAR | Status: DC | PRN
  Administered 2016-07-18 (×2): 25 ug via INTRAVENOUS

## 2016-07-17 MED ORDER — KETOROLAC TROMETHAMINE 30 MG/ML IJ SOLN
30.0000 mg | Freq: Once | INTRAMUSCULAR | Status: AC
  Administered 2016-07-18: 30 mg via INTRAVENOUS

## 2016-07-17 NOTE — Discharge Instructions (Signed)
Vertebroplasty Post Procedure Discharge Instructions  1. May resume a regular diet and any medications that you routinely take (including pain medications). 2. No driving day of procedure. 3. Upon discharge go home and rest for at least 4 hours.  May use an ice pack as needed to injection sites on back. 4.   Remove bandage after shower in the morning. Replace with bandade daily until healed. 5.   Do not pick up anything heavier than a milk jug. 6.   Follow up with primary care physician re: bone therapy.   Please contact our office at [phone] for the following symptoms:   Fever greater than 100 degrees  Increased swelling, pain, or redness at injection site.   Thank you for visiting [facility].

## 2016-07-18 ENCOUNTER — Ambulatory Visit
Admission: RE | Admit: 2016-07-18 | Discharge: 2016-07-18 | Disposition: A | Discharge status: Home / Self Care | Payer: Medicare Other | Source: Ambulatory Visit | Attending: Internal Medicine | Admitting: Internal Medicine

## 2016-07-18 ENCOUNTER — Other Ambulatory Visit: Payer: Self-pay | Admitting: Internal Medicine

## 2016-07-18 VITALS — BP 150/75 | HR 87 | Temp 97.5°F | Resp 8

## 2016-07-18 DIAGNOSIS — S32010B Wedge compression fracture of first lumbar vertebra, initial encounter for open fracture: Secondary | ICD-10-CM

## 2016-07-18 DIAGNOSIS — Z09 Encounter for follow-up examination after completed treatment for conditions other than malignant neoplasm: Secondary | ICD-10-CM

## 2016-07-18 DIAGNOSIS — S32010A Wedge compression fracture of first lumbar vertebra, initial encounter for closed fracture: Secondary | ICD-10-CM

## 2016-07-25 ENCOUNTER — Other Ambulatory Visit: Payer: Self-pay | Admitting: Internal Medicine

## 2016-07-25 MED ORDER — TIOTROPIUM BROMIDE-OLODATEROL 2.5-2.5 MCG/ACT IN AERS: 2.0000 | INHALATION_SPRAY | Freq: Every day | RESPIRATORY_TRACT | 3 refills | Status: DC

## 2016-07-27 ENCOUNTER — Other Ambulatory Visit: Payer: Medicare Other

## 2016-07-28 ENCOUNTER — Telehealth: Payer: Self-pay | Admitting: Internal Medicine

## 2016-07-28 NOTE — Telephone Encounter (Signed)
Patient called complaining of a boil he has had a few days that finally broke.  Patient was concerned that it was bleeding some.  Advised patient we did not have any appointments at this time but could see him Monday if needed.  Patient said he will contact Dr Ubaldo Glassing.  Advised patient he could apply alcohol or peroxide to the area and apply neosporin and cover the area as needed.

## 2016-07-31 ENCOUNTER — Ambulatory Visit
Admission: RE | Admit: 2016-07-31 | Discharge: 2016-07-31 | Disposition: A | Discharge status: Home / Self Care | Payer: Medicare Other | Source: Ambulatory Visit | Attending: Internal Medicine | Admitting: Internal Medicine

## 2016-07-31 DIAGNOSIS — Z09 Encounter for follow-up examination after completed treatment for conditions other than malignant neoplasm: Secondary | ICD-10-CM

## 2016-07-31 NOTE — Progress Notes (Signed)
    Chief Complaint: Follow up L1 Vertebroplasty 07/18/16  Referring Physician(s): Jeffrey Meadows,Jeffrey Meadows    History of Present Illness: Jeffrey Meadows  Reports continued improvement in his low back pain following vertebroplasty 07/18/16.  He denies any radicular symptoms.  He is able to get in and out of a chair easily and is able to accomplish all activities of daily living.  His pain is now 3-4/10 compared with 6-7/10 previously.  He now scores 7/24 of the Rolland-Morris disability questionnaire.   Allergies: Antihistamines, diphenhydramine-type  Medications: Prior to Admission medications   Medication Sig Start Date End Date Taking? Authorizing Provider  acetaminophen (TYLENOL) 500 MG tablet Take 500 mg by mouth 2 (two) times daily.    Historical Provider, MD  ADVAIR DISKUS 250-50 MCG/DOSE AEPB INL 1 PUFF PO BID 01/13/16   Historical Provider, MD  albuterol (PROAIR HFA) 108 (90 BASE) MCG/ACT inhaler Inhale 1-2 puffs into the lungs every 4 (four) hours as needed. as needed shortness of breath prior to exercise 03/27/11   Jeffrey Celeste, MD  amLODipine (NORVASC) 10 MG tablet TAKE 1 TABLET BY MOUTH DAILY 12/31/15   Jeffrey Showers, MD  atorvastatin (LIPITOR) 20 MG tablet TAKE 1 TABLET BY MOUTH DAILY AT [facility] 07/10/16   Jeffrey Showers, MD  calcipotriene (DOVONOX) 0.005 % ointment Apply 1 application topically 2 (two) times daily. Applies to affected area. 11/11/15   Historical Provider, MD  Cholecalciferol (VITAMIN D) 2000 UNITS CAPS Take 2,000 Units by mouth daily.     Historical Provider, MD  hydrochlorothiazide (HYDRODIURIL) 25 MG tablet TAKE 1 TABLET BY MOUTH EVERY MORNING 11/15/15   Jeffrey Showers, MD  HYDROcodone-acetaminophen [facility]) 10-325 MG tablet One half to one tab po q 6-8 hours prn compression fracture back pain. Take with food. 07/01/16   Jeffrey Showers, MD  Multiple Vitamin (MULTIVITAMIN WITH MINERALS) TABS tablet Take 1 tablet by mouth daily.    Historical Provider, MD  Omega-3 Fatty Acids (FISH  OIL PO) Take 2,000 Units by mouth daily.     Historical Provider, MD  polyethylene glycol powder (GLYCOLAX/MIRALAX) powder Take 17 g by mouth 2 (two) times daily as needed. 07/10/16   Jeffrey Showers, MD  potassium chloride (K-DUR) 10 MEQ tablet TAKE 1 TABLET(10 MEQ) BY MOUTH 1 TIME 05/29/16   Jeffrey Showers, MD  senna (SENOKOT) 8.6 MG tablet Take 1 tablet by mouth daily.    Historical Provider, MD  tamsulosin (FLOMAX) 0.4 MG CAPS capsule TK 2 CS PO ONCE D 12/12/15   Historical Provider, MD  Tiotropium Bromide-Olodaterol (STIOLTO RESPIMAT) 2.5-2.5 MCG/ACT AERS Inhale 2 puffs into the lungs daily. 07/25/16   Jeffrey Lever, MD     Review of Systems  Vital Signs: BP (!) 153/74   Pulse 80   Temp 97.5 F (36.4 C) (Oral)   SpO2 96% Comment: room air  Physical Exam   The wound has healed well.  He is able to get in and out of a chair easily.  He demonstrates normal gate.   Assessment and Plan:  Continued healing of L1 vertebral fracture following vertebroplasty.  No further treatment is recommended.  I do recommend evaluation of bone density given his age and this fracture.      Electronically Signed: Reizel Calzada Meadows 07/31/2016, 4:08 PM   I spent a total of 10 Minutes in face to face in clinical consultation, greater than 50% of which was counseling/coordinating care for Jeffrey Meadows

## 2016-09-11 ENCOUNTER — Encounter: Payer: Self-pay | Admitting: Family

## 2016-09-12 ENCOUNTER — Encounter: Payer: Self-pay | Admitting: Sports Medicine

## 2016-09-12 ENCOUNTER — Ambulatory Visit (INDEPENDENT_AMBULATORY_CARE_PROVIDER_SITE_OTHER): Payer: Medicare Other | Admitting: Sports Medicine

## 2016-09-12 DIAGNOSIS — G8929 Other chronic pain: Secondary | ICD-10-CM | POA: Diagnosis not present

## 2016-09-12 DIAGNOSIS — M79672 Pain in left foot: Secondary | ICD-10-CM

## 2016-09-12 NOTE — Progress Notes (Signed)
[facility] PROGRESS NOTE  Jeffrey Meadows [id number] DOB: 02-Mar-1933 DOA: (Not on file) PCP: [name], MD  Assessment/Plan: 83 yo M w/ bone spur evident over 1st MTP.  1. Custom temporary orthotics w/ "V" over MTP for bone spur. 2. NSAIDs PRN 3. Activity as tolerated 4. F/U PRN   HPI/Subjective:  Jeffrey Meadows is a 83 yo M w/ no significant PMH who presents for evaluation of l. Foot pain and evaluation of "bone spur." Describes progressive "sharp" 7/10 pain in L. Forefoot for last 5 years. Now taking daily aspirin and has fashioned his own orthotic with minimal relief. Denies recent trauma.   Denies history of additional foot problems or arthritis. Wears comfortable shoes w/ good soles. No h/o diabetes.  ROS - no foot swelling, no h/o ulceration, no sensory loss in foot.   Objective: Vitals:   09/12/16 0924  BP: 131/62  Weight: 140 lb (63.5 kg)  Height: 5\' 6"  (1.676 m)   @IOBRIEF @  Exam:  GEN: well appearing man sitting comfortably on table, NAD, Non-toxic, Alert & Oriented x 3 PSYCH: Normally interactive. Conversant. Not depressed or anxious appearing.  Calm demeanor.   FEET: Echymosis: no Edema: no ROM: full LE B Gait: heel toe, non-antalgic, slight pronation MT pain: yes, over 1st MTP Callus pattern: medial heel/great toe Lateral Mall: NT Medial Mall: NT Talus: NT Navicular: NT Cuboid: NT Calcaneous: NT Metatarsals: Tender over 1st MTP w/ bone spur evident, arthritic change over 1st tarsometatarsal 5th MT: NT Phalanges: NT Achilles: NT Plantar Fascia: NT Fat Pad: minimal Peroneals: NT Post Tib: NT Great Toe: Nml motion ATFL: NT CFL: NT Deltoid: NT Other foot breakdown: none Long arch: pes cavus Transverse arch: preserved Hindfoot breakdown: none  Sensation: intact  Studies: [place] (09/13/15)- noted spur over medial sesamoid, mild DJD over 1st MTP, no effusion.

## 2016-09-12 NOTE — Assessment & Plan Note (Signed)
Using good shoes  We will fashion a cutout pad to float the first MTP joint  This felt better immediately and hopefully will help  Add this cutout pad to other shoes

## 2016-09-13 ENCOUNTER — Encounter: Payer: Self-pay | Admitting: Family

## 2016-09-13 ENCOUNTER — Ambulatory Visit (INDEPENDENT_AMBULATORY_CARE_PROVIDER_SITE_OTHER)
Admission: RE | Admit: 2016-09-13 | Discharge: 2016-09-13 | Disposition: A | Discharge status: Home / Self Care | Payer: Medicare Other | Source: Ambulatory Visit | Attending: Family | Admitting: Family

## 2016-09-13 ENCOUNTER — Ambulatory Visit (INDEPENDENT_AMBULATORY_CARE_PROVIDER_SITE_OTHER): Payer: Medicare Other | Admitting: Family

## 2016-09-13 ENCOUNTER — Ambulatory Visit (HOSPITAL_COMMUNITY)
Admission: RE | Admit: 2016-09-13 | Discharge: 2016-09-13 | Disposition: A | Discharge status: Home / Self Care | Payer: Medicare Other | Source: Ambulatory Visit | Attending: Family | Admitting: Family

## 2016-09-13 VITALS — BP 138/70 | HR 66 | Temp 97.0°F | Resp 16 | Ht 66.0 in | Wt 148.0 lb

## 2016-09-13 DIAGNOSIS — I739 Peripheral vascular disease, unspecified: Secondary | ICD-10-CM | POA: Diagnosis present

## 2016-09-13 DIAGNOSIS — Z95828 Presence of other vascular implants and grafts: Secondary | ICD-10-CM | POA: Diagnosis not present

## 2016-09-13 DIAGNOSIS — I779 Disorder of arteries and arterioles, unspecified: Secondary | ICD-10-CM | POA: Diagnosis not present

## 2016-09-13 DIAGNOSIS — Z87891 Personal history of nicotine dependence: Secondary | ICD-10-CM | POA: Diagnosis not present

## 2016-09-13 NOTE — Patient Instructions (Signed)

## 2016-09-13 NOTE — Progress Notes (Signed)
[facility] [place]   CC: Follow up peripheral artery occlusive disease  History of Present Illness Jeffrey Meadows is a 83 y.o. male who is s/p left distal SFA popliteal artery bypass graft that was completed in March 2006 by Dr. Kellie Simmering. He returns today for follow up.  Pt. denies claudication in legs with walking, denies non healing wounds. Knees and hips are painful with walking. He does have psoriasis. He exercises 3-4 days/week for 10 minutes.  He denies any history of stroke or TIA. The patient reports a history of bladder stone removed. He had a laser TURP.  He had cement placed in his spine in December 2017 for compression fractures.  Pt Diabetic: No Pt smoker: former smoker, quit in the 1980's.  Pt meds include: Statin :Yes ASA: 500 mg twice daily for pain of bone spur left foot Other anticoagulants/antiplatelets: no     Past Medical History:  Diagnosis Date  . Adenomatous polyp of colon 08/2001   removed hepatic flexure  . Arthritis   . Cataract   . Chronic back pain    stenosis/scoliosis/synovial cyst  . Emphysema lung ([place])   . Enlarged prostate    takes Proscar daily  . History of blood transfusion    no abnormal reaction noted  . History of bronchitis    about 3-60yrs ago   . Hyperlipidemia    takes Atorvastatin daily  . Hypertension    takes Amlodipine and HCTZ daily  . Joint pain   . Joint swelling   . On home oxygen therapy    at night  . Raynaud disease   . Urinary frequency    takes Flomax daily  . Villous adenoma of colon 11/.2001   2 cm removed cecum    Social History Social History  Substance Use Topics  . Smoking status: Former Smoker    Packs/day: 1.50    Years: 40.00    Types: Cigarettes  . Smokeless tobacco: Never Used     Comment: quit smoking about 52yrs ago  . Alcohol use Yes     Comment: 3-5 glasses of wine daily    Family History Family History  Problem Relation Age of Onset  . Heart  failure Mother   . Heart disease Mother   . Aneurysm Father     Past Surgical History:  Procedure Laterality Date  . cataract surgery    . CYSTOSCOPY WITH LITHOLAPAXY N/A 01/11/2016   Procedure: CYSTOSCOPY WITH LITHOLAPAXY;  Surgeon: Festus Aloe, MD;  Location: [facility];  Service: Urology;  Laterality: N/A;  . GREEN LIGHT LASER TURP (TRANSURETHRAL RESECTION OF PROSTATE N/A 01/11/2016   Procedure: GREEN LIGHT LASER TURP (TRANSURETHRAL RESECTION OF PROSTATE;  Surgeon: Festus Aloe, MD;  Location: [facility];  Service: Urology;  Laterality: N/A;  . HERNIA REPAIR Left    inguinal  . HOLMIUM LASER APPLICATION N/A [id number]   Procedure: HOLMIUM LASER APPLICATION;  Surgeon: Festus Aloe, MD;  Location: [facility];  Service: Urology;  Laterality: N/A;  . JOINT REPLACEMENT     Right Hip  . LAMINECTOMY Right 07/02/2014   Procedure: Right Lumbar two-three Laminectomy for Synovial Cyst;  Surgeon: Erline Levine, MD;  Location: [facility];  Service: Neurosurgery;  Laterality: Right;  . POPLITEAL VENOUS ANEURYSM REPAIR Left 2010   about 6 yrs ago  . PROSTATE BIOPSY  4/95  . SPINE SURGERY  Jan. 2016   Dr. Vickie Epley  . TONSILLECTOMY    . TOTAL HIP ARTHROPLASTY  1952   right    Allergies  Allergen Reactions  . Antihistamines, Diphenhydramine-Type Other (See Comments)    Enlarged prostate    Current Outpatient Prescriptions  Medication Sig Dispense Refill  . acetaminophen (TYLENOL) 500 MG tablet Take 500 mg by mouth 2 (two) times daily.    Marland Kitchen ADVAIR DISKUS 250-50 MCG/DOSE AEPB INL 1 PUFF PO BID  5  . albuterol (PROAIR HFA) 108 (90 BASE) MCG/ACT inhaler Inhale 1-2 puffs into the lungs every 4 (four) hours as needed. as needed shortness of breath prior to exercise 1 Inhaler 11  . amLODipine (NORVASC) 10 MG tablet TAKE 1 TABLET BY MOUTH DAILY 90 tablet 3  . atorvastatin (LIPITOR) 20 MG tablet TAKE 1 TABLET BY MOUTH DAILY AT 6PM 90 tablet 3  . calcipotriene (DOVONOX) 0.005 % ointment Apply 1  application topically 2 (two) times daily. Applies to affected area.  3  . Cholecalciferol (VITAMIN D) 2000 UNITS CAPS Take 2,000 Units by mouth daily.     . hydrochlorothiazide (HYDRODIURIL) 25 MG tablet TAKE 1 TABLET BY MOUTH EVERY MORNING 90 tablet 3  . HYDROcodone-acetaminophen (NORCO) 10-325 MG tablet One half to one tab po q 6-8 hours prn compression fracture back pain. Take with food. 30 tablet 0  . Multiple Vitamin (MULTIVITAMIN WITH MINERALS) TABS tablet Take 1 tablet by mouth daily.    . Omega-3 Fatty Acids (FISH OIL PO) Take 2,000 Units by mouth daily.     . polyethylene glycol powder (GLYCOLAX/MIRALAX) powder Take 17 g by mouth 2 (two) times daily as needed. 3350 g 11  . potassium chloride (K-DUR) 10 MEQ tablet TAKE 1 TABLET(10 MEQ) BY MOUTH 1 TIME 90 tablet 3  . senna (SENOKOT) 8.6 MG tablet Take 1 tablet by mouth daily.    . tamsulosin (FLOMAX) 0.4 MG CAPS capsule TK 2 CS PO ONCE D  11  . Tiotropium Bromide-Olodaterol (STIOLTO RESPIMAT) 2.5-2.5 MCG/ACT AERS Inhale 2 puffs into the lungs daily. 3 Inhaler 3   No current facility-administered medications for this visit.     ROS: See HPI for pertinent positives and negatives.   Physical Examination  Vitals:   09/13/16 1411  BP: 138/70  Pulse: 66  Resp: 16  Temp: 97 F (36.1 C)  TempSrc: Oral  SpO2: 100%  Weight: 148 lb (67.1 kg)  Height: 5\' 6"  (1.676 m)   Body mass index is 23.89 kg/m.  General: A&O x 3, WDWN. Gait: normal Eyes: PERRLA. Pulmonary: Respirations are non labored, CTAB, without wheezes, rales or rhonchi. Cardiac: regular rhythm, no detected murmur.     Carotid Bruits Left Right   Negative Negative   Aorta is not palpable. Radial pulses: are 2+ palpable and =.   VASCULAR EXAM: Extremitieswithout ischemic changes  without Gangrene; without open wounds.      LE Pulses LEFT RIGHT   FEMORAL  palpable not palpable    POPLITEAL not palpable  not palpable   POSTERIOR TIBIAL  1+palpable   not palpable    DORSALIS PEDIS  ANTERIOR TIBIAL not palpable  2+ palpable    Abdomen: soft, NT, no palpable masses. Skin: red macular psoriasis patches on anterior lower legs has resolved, no ulcers. Musculoskeletal: no muscle wasting or atrophy. Neurologic: A&O X 3; Appropriate Affect ; SENSATION: normal; MOTOR FUNCTION: moving all extremities equally, motor strength 5/5 throughout. Speech is fluent/normal. CN 2-12 intact. Fine tremor in outstretched fingers.     ASSESSMENT: Jeffrey Meadows is a 83 y.o. male whois s/p left distal  SFA popliteal artery bypass graft that was completed in March 2006. He has no claudication with walking, no signs of ischemia in his feet/legs.  His walking is limited by a painful left foot bone spur, see Plan.   DATA Today's left LE arterial duplex suggests 50-74% stenosis in the mid/distal left femoral artery. Patent left lower extremity bypass graft with no evidence of restenosis, based on anatomy visualized; it was extremely difficult to distinguish native artery from graft.  Waveforms: triphasic at inflow and outflow, monophasic in the bypass graft.  No significant change compared to the last exam on 03-02-16.  ABI's today: Right: [place] (03-02-16 [place]), triphasic waveforms, TBI: 0.88 (was 0.89 on last exam). Left: [place] ([place]), triphasic PT, biphasic DP; TBI: 0.64 (was 0.65 on last exam). No changed in bilateral ABI's and TBI's. Non compressible calcified vessels bilaterally.    Plan: Follow-up in 9 months with ABI's and left LE arterial duplex. He is unable to participate in a graduated walking program due to the bone spur on the ball of his left foot causing pain with  walking. He will use his stationary bike for a total of 30 minutes daily and daily seated leg exercises as discussed and demonstrated.   I discussed in depth with the patient the nature of atherosclerosis, and emphasized the importance of maximal medical management including strict control of blood pressure, blood glucose, and lipid levels, obtaining regular exercise, and continued cessation of smoking.  The patient is aware that without maximal medical management the underlying atherosclerotic disease process will progress, limiting the benefit of any interventions.  The patient was given information about PAD including signs, symptoms, treatment, what symptoms should prompt the patient to seek immediate medical care, and risk reduction measures to take.  Clemon Chambers, RN, MSN, FNP-C [facility] Phone: [phone]  Clinic MD: [facility]  09/13/16 2:23 PM

## 2016-09-15 NOTE — Addendum Note (Signed)
Addended by: Lianne Cure A on: 09/15/2016 04:52 PM   Modules accepted: Orders

## 2016-10-03 ENCOUNTER — Ambulatory Visit (INDEPENDENT_AMBULATORY_CARE_PROVIDER_SITE_OTHER): Payer: Medicare Other | Admitting: Sports Medicine

## 2016-10-03 ENCOUNTER — Encounter: Payer: Self-pay | Admitting: Sports Medicine

## 2016-10-03 DIAGNOSIS — M79672 Pain in left foot: Secondary | ICD-10-CM

## 2016-10-03 DIAGNOSIS — G8929 Other chronic pain: Secondary | ICD-10-CM | POA: Diagnosis not present

## 2016-10-03 MED ORDER — METHYLPREDNISOLONE ACETATE 40 MG/ML IJ SUSP
20.0000 mg | Freq: Once | INTRAMUSCULAR | Status: AC
  Administered 2016-10-03: 20 mg via INTRA_ARTICULAR

## 2016-10-03 MED ORDER — AMBULATORY NON FORMULARY MEDICATION: 2 refills | Status: DC

## 2016-10-03 NOTE — Progress Notes (Signed)
  Jeffrey Meadows - 83 y.o. male MRN [id number]  Date of birth: Dec 17, 1932  SUBJECTIVE:  Including CC & ROS.   Jeffrey Meadows is an 83 year old male is presenting with left foot pain. He was seen about a month ago and had some adjustments made to his orthotic. We're trying to achieve to float his first MTP joint. He reports she still having pain in this location and he has added extra padding with no benefit. He denies any injury or trauma to the area. He is having pain at night. He has pain with walking and sometimes with he has been sitting too long.  ROS: No unexpected weight loss, fever, chills, swelling, instability, muscle pain, numbness/tingling, redness, otherwise see HPI    HISTORY: Past Medical, Surgical, Social, and Family History Reviewed & Updated per EMR.   Pertinent Historical Findings include: PMSHx -  hypertension, hyperlipidemia, COPD  DATA REVIEWED: None to review   PHYSICAL EXAM:  VS: BP:110/60  HR: bpm  TEMP: ( )  RESP:   HT:5\' 6"  (167.6 cm)   WT:140 lb (63.5 kg)  BMI:22.6 PHYSICAL EXAM: Gen: NAD, alert, cooperative with exam, well-appearing HEENT: clear conjunctiva, EOMI CV:  no edema, capillary refill brisk,  Resp: non-labored, normal speech Skin: no rashes, normal turgor  Neuro: no gross deficits.  Psych:  alert and oriented Left foot: Hammertoes present Past cavus Tenderness to palpation of the medial first MTP joint. Limited flexion and extension of the great big toe. No overlying swelling or ecchymosis. Normal ankle range of motion. Neurovascularly intact   Aspiration/Injection Procedure Note Jeffrey Meadows 1932-12-28  Procedure: Injection Indications: Left first MTP joint pain  Procedure Details Consent: Risks of procedure as well as the alternatives and risks of each were explained to the (patient/caregiver).  Consent for procedure obtained. Time Out: Verified patient identification, verified procedure, site/side was marked, verified correct  patient position, special equipment/implants available, medications/allergies/relevent history reviewed, required imaging and test results available.  Performed.  The area was cleaned with iodine and alcohol swabs.    The left 1st MTP joint was injected using 1 cc's of 40 mg Depomedrol and 1 cc's of 1% lidocaine with a 30 5/8" needle.  Ultrasound wasused. Images were obtained in Transverse and Long views showing the injection.    A sterile dressing was applied.  Patient did tolerate procedure well.   ASSESSMENT & PLAN:   Chronic foot pain, left Still having pain in his left foot. We have tried significant padding with limited relief - Injection of his left first MTP joint today - We'll try ketoprofen cream - Follow-up as needed

## 2016-10-03 NOTE — Assessment & Plan Note (Signed)
Still having pain in his left foot. We have tried significant padding with limited relief - Injection of his left first MTP joint today - We'll try ketoprofen cream - Follow-up as needed

## 2016-11-10 ENCOUNTER — Other Ambulatory Visit: Payer: Self-pay | Admitting: Internal Medicine

## 2016-11-14 ENCOUNTER — Telehealth: Payer: Self-pay | Admitting: Internal Medicine

## 2016-11-14 MED ORDER — BENZONATATE 200 MG PO CAPS: 200.0000 mg | ORAL_CAPSULE | Freq: Three times a day (TID) | ORAL | 5 refills | Status: DC | PRN

## 2016-11-14 NOTE — Telephone Encounter (Signed)
Rx sent to pharmacy   

## 2016-12-23 ENCOUNTER — Other Ambulatory Visit: Payer: Self-pay | Admitting: Internal Medicine

## 2017-01-31 ENCOUNTER — Ambulatory Visit (INDEPENDENT_AMBULATORY_CARE_PROVIDER_SITE_OTHER): Payer: Medicare Other | Admitting: Internal Medicine

## 2017-01-31 ENCOUNTER — Ambulatory Visit (INDEPENDENT_AMBULATORY_CARE_PROVIDER_SITE_OTHER)
Admission: RE | Admit: 2017-01-31 | Discharge: 2017-01-31 | Disposition: A | Discharge status: Home / Self Care | Payer: Medicare Other | Source: Ambulatory Visit | Attending: Internal Medicine | Admitting: Internal Medicine

## 2017-01-31 ENCOUNTER — Encounter: Payer: Self-pay | Admitting: Internal Medicine

## 2017-01-31 VITALS — BP 122/68 | HR 68 | Ht 68.0 in | Wt 149.0 lb

## 2017-01-31 DIAGNOSIS — J439 Emphysema, unspecified: Secondary | ICD-10-CM | POA: Diagnosis not present

## 2017-01-31 DIAGNOSIS — F17201 Nicotine dependence, unspecified, in remission: Secondary | ICD-10-CM | POA: Diagnosis not present

## 2017-01-31 DIAGNOSIS — J449 Chronic obstructive pulmonary disease, unspecified: Secondary | ICD-10-CM | POA: Diagnosis not present

## 2017-01-31 NOTE — Patient Instructions (Signed)
We can continue present meds   Order- CXR     Dx COPD mixed type  Please call if we can help

## 2017-01-31 NOTE — Assessment & Plan Note (Signed)
Stiolto has served him well as a maintenance LABA/LAMA inhaler He is stable within the limits of his disease. Plan-CXR

## 2017-01-31 NOTE — Assessment & Plan Note (Signed)
I do not expect him to resume smoking.

## 2017-01-31 NOTE — Progress Notes (Signed)
HPI male former smoker (60 pack years) followed for COPD/emphysema, complicated by BPH PFT- 40/34/[id number] obstructive airways disease with insignificant response to bronchodilator, emphysema pattern. Air-trapping with increased residual volume. Diffusion moderately reduced. FVC 3.03/78%, FEV11.11/44%, FEV1/FVC 0.37. TLC 115%, RV 137%, DLCO 52%. 6MWT- 10/04/2012-92%, 83%, 91%, 336 m. Significant desaturation with exertion  -------------------------------------------------------------------  02/02/2016-83 year old male former smoker (60 pack years) followed for COPD/emphysema, complicated by BPH O2 2 L sleep/APS FOLLOWS FOR:Pt denies any SOB, wheezing, or congestion at this time. He and wife agree he has been stable over the past year without acute respiratory infections. No routine cough. CXR 08/04/2015- IMPRESSION: COPD and pleural parenchymal scarring. No acute cardiopulmonary disease. Electronically Signed  By: Marcello Moores Register  On: 08/04/2015 10:02  01/31/17- 83 year old male former smoker (60 pack year) followed for emphysema, complicated by BPH Wife here FOLLOWS FOR: Breathing is doing well since last OV. Denies chest tighntess, wheezing, SOB or coughing. He and his wife feel he is stable with no respiratory exacerbations. There has been no major change in his exercise tolerance and he does continue effort to exercise for stamina. Little routine cough or wheeze. No discolored sputum or exertional chest pain.  ROS-see HPI Constitutional:   No-   weight loss, night sweats, fevers, chills, fatigue, lassitude. HEENT:   No-  headaches, difficulty swallowing, tooth/dental problems, sore throat,       No-  sneezing, itching, ear ache, nasal congestion, post nasal drip,  CV:  No-   chest pain, orthopnea, PND, swelling in lower extremities, anasarca,  +dizziness, palpitations Resp: +  shortness of breath with exertion or at rest.             No- productive cough,  No non-productive  cough,  No- coughing up of blood.              No-   change in color of mucus.  No- wheezing.   Skin: No-   rash or lesions. GI:  No-   heartburn, indigestion, abdominal pain, nausea, vomiting, GU: MS:  No-   joint pain or swelling. . Neuro-     nothing unusual Psych:  No- change in mood or affect. No depression or anxiety.  No memory loss.  OBJ- Physical Exam   stable baseline exam General- Alert, Oriented, Affect-appropriate, Distress- none acute, trim Skin- rash-none, lesions- none, excoriation- none, pale Lymphadenopathy- none Head- atraumatic            Eyes- Gross vision intact, PERRLA, conjunctivae and secretions clear            Ears- Hearing, canals-normal            Nose- Clear, no-Septal dev, mucus, polyps, erosion, perforation             Throat- Mallampati III , mucosa clear , drainage- none, tonsils- atrophic. No thrush Neck- flexible , trachea midline, no stridor , thyroid nl, carotid no bruit Chest - symmetrical excursion , unlabored           Heart/CV- RRR , no murmur , no gallop  , no rub, nl s1 s2                           - JVD- none , edema- none, stasis changes- none, varices- none           Lung- +distant but clear to P&A, wheeze- none, cough-none , dullness-none, rub- none.  Chest wall-  Abd-  Br/ Gen/ Rectal- Not done, not indicated Extrem- cyanosis- none, clubbing, none, atrophy- none, strength- nl Neuro- grossly intact to observation

## 2017-02-02 ENCOUNTER — Telehealth: Payer: Self-pay | Admitting: Internal Medicine

## 2017-02-02 NOTE — Telephone Encounter (Signed)
Notes recorded by Deneise Lever, MD on 01/31/2017 at 3:34 PM EDT CXR- stable emphysema with some old scarring and incidental atherosclerosis noted. No acute process. ------------------------------------------- Spoke with pt. He is aware of results. Nothing further was needed.

## 2017-02-05 ENCOUNTER — Telehealth: Payer: Self-pay

## 2017-02-05 NOTE — Telephone Encounter (Signed)
Wife called states patient is taking aspirin for "arthritic pain" and she said she read on a health magazine that aspirin wasn't recommended for arthritic pain. She wants to know what other OTC NSAIDS would you recommend?

## 2017-02-05 NOTE — Telephone Encounter (Signed)
Aleve twice daily with food may be helpful.

## 2017-02-20 ENCOUNTER — Encounter: Payer: Self-pay | Admitting: Sports Medicine

## 2017-02-20 ENCOUNTER — Ambulatory Visit (INDEPENDENT_AMBULATORY_CARE_PROVIDER_SITE_OTHER): Payer: Medicare Other | Admitting: Sports Medicine

## 2017-02-20 DIAGNOSIS — R269 Unspecified abnormalities of gait and mobility: Secondary | ICD-10-CM

## 2017-02-20 DIAGNOSIS — M79672 Pain in left foot: Secondary | ICD-10-CM

## 2017-02-20 DIAGNOSIS — G8929 Other chronic pain: Secondary | ICD-10-CM | POA: Diagnosis not present

## 2017-02-20 NOTE — Progress Notes (Signed)
CC; Pain under Left first MTP  Patient found to have some spurring XR showed this on medianl sessamoid This was inflamed in Feb 2018 CSI settled down pain quickly  Now pain that is worse in certain shoes Scaphoid pads did not take away enough of the pain Comes to see if we can add some support  ROS No redness of joint No swelling Denies trauma to joint  PE Pleasant older M in NAD BP 124/60    Pes cavus change on left foot First MTP joint is irregular Palpable irregularity of median sessamoid limitatin of motion of great toe No redness No swelling No TTP

## 2017-02-20 NOTE — Assessment & Plan Note (Signed)
WE added more rigid L pads to float the sessamoid Placed these in 2 pairs of shoes  Better comfort w walking  Test x 1 mo If helpful add to other shoes

## 2017-03-13 ENCOUNTER — Other Ambulatory Visit: Payer: Medicare Other | Admitting: Internal Medicine

## 2017-03-13 DIAGNOSIS — Z0000 Encounter for general adult medical examination without abnormal findings: Secondary | ICD-10-CM

## 2017-03-13 DIAGNOSIS — E785 Hyperlipidemia, unspecified: Secondary | ICD-10-CM

## 2017-03-13 DIAGNOSIS — F1020 Alcohol dependence, uncomplicated: Secondary | ICD-10-CM

## 2017-03-13 DIAGNOSIS — I10 Essential (primary) hypertension: Secondary | ICD-10-CM

## 2017-03-13 LAB — CBC WITH DIFFERENTIAL/PLATELET
Basophils Absolute: 77 cells/uL (ref 0–200)
Basophils Relative: 1 %
Eosinophils Absolute: 154 cells/uL (ref 15–500)
Eosinophils Relative: 2 %
HCT: 47.3 % (ref 38.5–50.0)
Hemoglobin: 16.3 g/dL (ref 13.2–17.1)
Lymphocytes Relative: 26 %
Lymphs Abs: 2002 cells/uL (ref 850–3900)
MCH: 32.2 pg (ref 27.0–33.0)
MCHC: 34.5 g/dL (ref 32.0–36.0)
MCV: 93.5 fL (ref 80.0–100.0)
MPV: 9.8 fL (ref 7.5–12.5)
Monocytes Absolute: 770 cells/uL (ref 200–950)
Monocytes Relative: 10 %
Neutro Abs: 4697 cells/uL (ref 1500–7800)
Neutrophils Relative %: 61 %
Platelets: 247 10*3/uL (ref 140–400)
RBC: 5.06 MIL/uL (ref 4.20–5.80)
RDW: 13.6 % (ref 11.0–15.0)
WBC: 7.7 10*3/uL (ref 3.8–10.8)

## 2017-03-14 LAB — LIPID PANEL
Cholesterol: 141 mg/dL (ref <200)
HDL: 64 mg/dL (ref >40)
LDL Cholesterol: 64 mg/dL (ref <100)
Total CHOL/HDL Ratio: 2.2 Ratio (ref <5.0)
Triglycerides: 65 mg/dL (ref <150)
VLDL: 13 mg/dL (ref <30)

## 2017-03-14 LAB — COMPLETE METABOLIC PANEL WITH GFR
ALT: 15 U/L (ref 9–46)
AST: 16 U/L (ref 10–35)
Albumin: 4 g/dL (ref 3.6–5.1)
Alkaline Phosphatase: 67 U/L (ref 40–115)
BUN: 12 mg/dL (ref 7–25)
CO2: 27 mmol/L (ref 20–31)
Calcium: 9.4 mg/dL (ref 8.6–10.3)
Chloride: 103 mmol/L (ref 98–110)
Creat: 0.68 mg/dL — ABNORMAL LOW (ref 0.70–1.11)
GFR, Est African American: >89 mL/min (ref >=60)
GFR, Est Non African American: 89 mL/min (ref >=60)
Glucose, Bld: 84 mg/dL (ref 65–99)
Potassium: 4 mmol/L (ref 3.5–5.3)
Sodium: 142 mmol/L (ref 135–146)
Total Bilirubin: 0.9 mg/dL (ref 0.2–1.2)
Total Protein: 6 g/dL — ABNORMAL LOW (ref 6.1–8.1)

## 2017-03-14 LAB — PSA: PSA: 3.9 ng/mL (ref <=4.0)

## 2017-03-19 ENCOUNTER — Encounter: Payer: Self-pay | Admitting: Internal Medicine

## 2017-03-19 ENCOUNTER — Other Ambulatory Visit: Payer: Self-pay

## 2017-03-19 ENCOUNTER — Ambulatory Visit (INDEPENDENT_AMBULATORY_CARE_PROVIDER_SITE_OTHER): Payer: Medicare Other | Admitting: Internal Medicine

## 2017-03-19 VITALS — BP 116/70 | HR 71 | Temp 97.3°F | Ht 65.75 in | Wt 148.0 lb

## 2017-03-19 DIAGNOSIS — G8929 Other chronic pain: Secondary | ICD-10-CM

## 2017-03-19 DIAGNOSIS — L409 Psoriasis, unspecified: Secondary | ICD-10-CM

## 2017-03-19 DIAGNOSIS — M8580 Other specified disorders of bone density and structure, unspecified site: Secondary | ICD-10-CM

## 2017-03-19 DIAGNOSIS — Z0000 Encounter for general adult medical examination without abnormal findings: Secondary | ICD-10-CM

## 2017-03-19 DIAGNOSIS — M25552 Pain in left hip: Secondary | ICD-10-CM

## 2017-03-19 DIAGNOSIS — N400 Enlarged prostate without lower urinary tract symptoms: Secondary | ICD-10-CM | POA: Diagnosis not present

## 2017-03-19 DIAGNOSIS — I10 Essential (primary) hypertension: Secondary | ICD-10-CM

## 2017-03-19 DIAGNOSIS — Z8709 Personal history of other diseases of the respiratory system: Secondary | ICD-10-CM

## 2017-03-19 DIAGNOSIS — R972 Elevated prostate specific antigen [PSA]: Secondary | ICD-10-CM | POA: Diagnosis not present

## 2017-03-19 DIAGNOSIS — M79672 Pain in left foot: Secondary | ICD-10-CM

## 2017-03-19 DIAGNOSIS — Z8679 Personal history of other diseases of the circulatory system: Secondary | ICD-10-CM | POA: Diagnosis not present

## 2017-03-19 LAB — POCT URINALYSIS DIPSTICK
Bilirubin, UA: NEGATIVE
Blood, UA: NEGATIVE
Glucose, UA: NEGATIVE
Ketones, UA: NEGATIVE
Leukocytes, UA: NEGATIVE
Nitrite, UA: NEGATIVE
Protein, UA: NEGATIVE
Spec Grav, UA: 1.02 (ref 1.010–1.025)
Urobilinogen, UA: 0.2 E.U./dL
pH, UA: 6.5 (ref 5.0–8.0)

## 2017-03-19 MED ORDER — AMBULATORY NON FORMULARY MEDICATION: 2 refills | Status: DC
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
## 2017-03-23 ENCOUNTER — Telehealth: Payer: Self-pay | Admitting: Internal Medicine

## 2017-03-23 DIAGNOSIS — J438 Other emphysema: Secondary | ICD-10-CM

## 2017-03-23 MED ORDER — ALBUTEROL SULFATE HFA 108 (90 BASE) MCG/ACT IN AERS: 1.0000 | INHALATION_SPRAY | RESPIRATORY_TRACT | 11 refills | Status: DC | PRN

## 2017-03-23 NOTE — Telephone Encounter (Signed)
Done

## 2017-03-23 NOTE — Telephone Encounter (Signed)
Patient calling to state that his Albuterol inhaler needs to be sent to his pharmacy please.    Pharmacy:  [facility] [place]  Thank you.

## 2017-03-23 NOTE — Telephone Encounter (Signed)
Refill for one year 

## 2017-03-30 ENCOUNTER — Telehealth: Payer: Self-pay | Admitting: Internal Medicine

## 2017-03-30 MED ORDER — PREDNISONE 10 MG PO TABS: ORAL_TABLET | ORAL | 0 refills | Status: DC

## 2017-03-30 NOTE — Telephone Encounter (Signed)
Called and spoke with pt's spouse. [name] states pt reports of sore throat, chest tightness, sneezing & non prod cough x3d. Pt started zpak and tessalon last night that he had in case of a flare.  Denies fever, chills or sweats. [name] is requesting an Rx [facility] [address]  [name] also ask if pt is contagious, as they have lunch plans.  CY please advise. Thanks.  Current Outpatient Prescriptions on File Prior to Visit  Medication Sig Dispense Refill  . albuterol (PROAIR HFA) 108 (90 Base) MCG/ACT inhaler Inhale 1-2 puffs into the lungs every 4 (four) hours as needed. as needed shortness of breath prior to exercise 1 Inhaler 11  . AMBULATORY NON FORMULARY MEDICATION Ketoprofen 20% cream 1 Tube 2  . amLODipine (NORVASC) 10 MG tablet TAKE ONE TABLET BY MOUTH DAILY 90 tablet 3  . atorvastatin (LIPITOR) 20 MG tablet TAKE 1 TABLET BY MOUTH DAILY AT 6PM 90 tablet 3  . calcipotriene (DOVONOX) 0.005 % ointment Apply 1 application topically 2 (two) times daily. Applies to affected area.  3  . Cholecalciferol (VITAMIN D) 2000 UNITS CAPS Take 2,000 Units by mouth daily.     . hydrochlorothiazide (HYDRODIURIL) 25 MG tablet TAKE ONE TABLET BY MOUTH EVERY MORNING 90 tablet 3  . Multiple Vitamin (MULTIVITAMIN WITH MINERALS) TABS tablet Take 1 tablet by mouth daily.    . Omega-3 Fatty Acids (FISH OIL PO) Take 2,000 Units by mouth daily.     . polyethylene glycol powder (GLYCOLAX/MIRALAX) powder Take 17 g by mouth 2 (two) times daily as needed. 3350 g 11  . potassium chloride (K-DUR) 10 MEQ tablet TAKE 1 TABLET(10 MEQ) BY MOUTH 1 TIME 90 tablet 3  . tamsulosin (FLOMAX) 0.4 MG CAPS capsule TK 2 CS PO ONCE D  11  . Tiotropium Bromide-Olodaterol (STIOLTO RESPIMAT) 2.5-2.5 MCG/ACT AERS Inhale 2 puffs into the lungs daily. (Patient not taking: Reported on 03/19/2017) 3 Inhaler 3   No current facility-administered medications on file prior to visit.     Allergies  Allergen Reactions  .  Antihistamines, Diphenhydramine-Type Other (See Comments)    Enlarged prostate

## 2017-03-30 NOTE — Telephone Encounter (Signed)
If this is viral infection, then he is likely contagious, still early in the course.   Ok prednisone 10 mg, # 20, 4 X 2 DAYS, 3 X 2 DAYS, 2 X 2 DAYS, 1 X 2 DAYS

## 2017-03-30 NOTE — Telephone Encounter (Signed)
Jeffrey Meadows is aware of CY's recommendations and voiced her understanding. Rx for prednisone has been sent to preferred pharmacy. Nothing further needed.

## 2017-04-04 ENCOUNTER — Other Ambulatory Visit: Payer: Self-pay | Admitting: Internal Medicine

## 2017-04-10 ENCOUNTER — Telehealth: Payer: Self-pay | Admitting: Internal Medicine

## 2017-04-10 NOTE — Telephone Encounter (Signed)
Predisone 10 mg, # 20, 4 X 2 DAYS, 3 X 2 DAYS, 2 X 2 DAYS, 1 X 2 DAYS

## 2017-04-10 NOTE — Telephone Encounter (Signed)
Spoke with [name]. She is aware of CY's recs. She stated that the patient already has some prednisone, thus a RX is not needed at these time.

## 2017-04-10 NOTE — Telephone Encounter (Signed)
Wife Jeffrey Meadows [phone]); pt finished zpak for cough he had last week, still have rumbling in chest, is it ok to start a course of Prednisone (have some at the house) and another zpak ( have some at the house. pt contact # [phone].Marland Kitchen Pt have shortness of breath....ert

## 2017-04-10 NOTE — Telephone Encounter (Signed)
Spoke with patient's wife. She stated that the patient has already finished the zpak he received last week for his cough. He still has the cough and is unable to cough anything up. [name] is wondering if CY could call in a prednisone taper for him. Denies any fever or chills, just a cough that will not go away.   [facility] [address], please advise. Thanks!

## 2017-04-12 NOTE — Patient Instructions (Signed)
It was a pleasure to see you today. Continue same medications and return in one year or as needed.

## 2017-04-26 ENCOUNTER — Ambulatory Visit (INDEPENDENT_AMBULATORY_CARE_PROVIDER_SITE_OTHER): Payer: Medicare Other | Admitting: Internal Medicine

## 2017-04-26 DIAGNOSIS — Z23 Encounter for immunization: Secondary | ICD-10-CM

## 2017-04-26 NOTE — Progress Notes (Signed)
Flu shot given in left deltoid, pt tolerated well. Laqueisha Catalina L, CMA 

## 2017-04-26 NOTE — Patient Instructions (Signed)
Flu shot given

## 2017-06-14 ENCOUNTER — Encounter (HOSPITAL_COMMUNITY): Payer: Medicare Other

## 2017-06-14 ENCOUNTER — Ambulatory Visit: Payer: Medicare Other | Admitting: Family

## 2017-06-14 ENCOUNTER — Other Ambulatory Visit (HOSPITAL_COMMUNITY): Payer: Medicare Other

## 2017-06-30 ENCOUNTER — Other Ambulatory Visit: Payer: Self-pay | Admitting: Internal Medicine

## 2017-07-03 ENCOUNTER — Ambulatory Visit (INDEPENDENT_AMBULATORY_CARE_PROVIDER_SITE_OTHER)
Admission: RE | Admit: 2017-07-03 | Discharge: 2017-07-03 | Disposition: A | Discharge status: Home / Self Care | Payer: Medicare Other | Source: Ambulatory Visit | Attending: Family | Admitting: Family

## 2017-07-03 ENCOUNTER — Ambulatory Visit (INDEPENDENT_AMBULATORY_CARE_PROVIDER_SITE_OTHER): Payer: Medicare Other | Admitting: Family

## 2017-07-03 ENCOUNTER — Ambulatory Visit (HOSPITAL_COMMUNITY)
Admission: RE | Admit: 2017-07-03 | Discharge: 2017-07-03 | Disposition: A | Discharge status: Home / Self Care | Payer: Medicare Other | Source: Ambulatory Visit | Attending: Family | Admitting: Family

## 2017-07-03 ENCOUNTER — Encounter: Payer: Self-pay | Admitting: Family

## 2017-07-03 VITALS — BP 141/81 | HR 70 | Temp 97.2°F | Resp 18 | Wt 152.3 lb

## 2017-07-03 DIAGNOSIS — I779 Disorder of arteries and arterioles, unspecified: Secondary | ICD-10-CM

## 2017-07-03 DIAGNOSIS — Z95828 Presence of other vascular implants and grafts: Secondary | ICD-10-CM | POA: Diagnosis not present

## 2017-07-03 DIAGNOSIS — Z87891 Personal history of nicotine dependence: Secondary | ICD-10-CM

## 2017-07-03 DIAGNOSIS — I70202 Unspecified atherosclerosis of native arteries of extremities, left leg: Secondary | ICD-10-CM | POA: Diagnosis not present

## 2017-07-03 NOTE — Progress Notes (Signed)
[facility] [place] [facility]   CC: Follow up peripheral artery occlusive disease  History of Present Illness Jeffrey Meadows is a 83 y.o. male who is s/p left distal SFA popliteal artery bypass graft that was completed in March 2006 by Dr. Kellie Simmering. He returns today for follow up.  Pt. denies claudication in legs with walking, denies non healing wounds. Knees and hips are painful with walking. He does have psoriasis. He exercises 3 days/week for 30 minutes on a stationary bike.  He denies any history of stroke or TIA. The patient reports a history of bladder stone removed. He had a laser TURP.  He had cement placed in his spine in December 2017 for compression fractures.  Pt Diabetic: No Pt smoker: former smoker, quit in the 1980's.  Pt meds include: Statin :Yes ASA: 81 mg daily Other anticoagulants/antiplatelets: no   Past Medical History:  Diagnosis Date  . Adenomatous polyp of colon 08/2001   removed hepatic flexure  . Arthritis   . Cataract   . Chronic back pain    stenosis/scoliosis/synovial cyst  . Emphysema lung ([place])   . Enlarged prostate    takes Proscar daily  . History of blood transfusion    no abnormal reaction noted  . History of bronchitis    about 3-11yrs ago   . Hyperlipidemia    takes Atorvastatin daily  . Hypertension    takes Amlodipine and HCTZ daily  . Joint pain   . Joint swelling   . On home oxygen therapy    at night  . Raynaud disease   . Urinary frequency    takes Flomax daily  . Villous adenoma of colon 11/.2001   2 cm removed cecum    Social History Social History   Tobacco Use  . Smoking status: Former Smoker    Packs/day: 1.50    Years: 40.00    Pack years: 60.00    Types: Cigarettes  . Smokeless tobacco: Never Used  . Tobacco comment: quit smoking about 66yrs ago  Substance Use Topics  . Alcohol use: Yes    Comment: 3-5 glasses of wine daily  . Drug use: No    Family History Family History   Problem Relation Age of Onset  . Heart failure Mother   . Heart disease Mother   . Aneurysm Father     Past Surgical History:  Procedure Laterality Date  . cataract surgery    . CYSTOSCOPY WITH LITHOLAPAXY N/A 01/11/2016   Performed by Festus Aloe, MD at [facility]S  . GREEN LIGHT LASER TURP (TRANSURETHRAL RESECTION OF PROSTATE N/A 01/11/2016   Performed by Festus Aloe, MD at [facility] ORS  . HERNIA REPAIR Left    inguinal  . HOLMIUM LASER APPLICATION N/A 4/48/1856   Performed by Festus Aloe, MD at [facility] ORS  . JOINT REPLACEMENT     Right Hip  . POPLITEAL VENOUS ANEURYSM REPAIR Left 2010   about 6 yrs ago  . PROSTATE BIOPSY  4/95  . Right Lumbar two-three Laminectomy for Synovial Cyst Right 07/02/2014   Performed by Erline Levine, MD at [facility] NEURO ORS  . SPINE SURGERY  Jan. 2016   Dr. Vickie Epley  . TONSILLECTOMY    . TOTAL HIP ARTHROPLASTY  1952   right    Allergies  Allergen Reactions  . Antihistamines, Diphenhydramine-Type Other (See Comments)    Enlarged prostate    Current Outpatient Medications  Medication Sig Dispense Refill  . albuterol (PROAIR HFA) 108 (  90 Base) MCG/ACT inhaler Inhale 1-2 puffs into the lungs every 4 (four) hours as needed. as needed shortness of breath prior to exercise 1 Inhaler 11  . AMBULATORY NON FORMULARY MEDICATION Ketoprofen 20% cream 1 Tube 2  . amLODipine (NORVASC) 10 MG tablet TAKE ONE TABLET BY MOUTH DAILY 90 tablet 3  . atorvastatin (LIPITOR) 20 MG tablet TAKE 1 TABLET BY MOUTH DAILY AT 6PM 90 tablet 3  . calcipotriene (DOVONOX) 0.005 % ointment Apply 1 application topically 2 (two) times daily. Applies to affected area.  3  . Cholecalciferol (VITAMIN D) 2000 UNITS CAPS Take 2,000 Units by mouth daily.     . hydrochlorothiazide (HYDRODIURIL) 25 MG tablet TAKE ONE TABLET BY MOUTH EVERY MORNING 90 tablet 3  . KLOR-CON 10 10 MEQ tablet TAKE ONE TABLET BY MOUTH ONE TIME DAILY 90 tablet 3  . Multiple Vitamin (MULTIVITAMIN WITH MINERALS)  TABS tablet Take 1 tablet by mouth daily.    . Omega-3 Fatty Acids (FISH OIL PO) Take 2,000 Units by mouth daily.     . polyethylene glycol powder (GLYCOLAX/MIRALAX) powder Take 17 g by mouth 2 (two) times daily as needed. 3350 g 11  . predniSONE (DELTASONE) 10 MG tablet 4 tabs x 2 days, 3 tabs x 2 days, 2 tabs x 2 days, 1 tab x 2 days then stop 20 tablet 0  . tamsulosin (FLOMAX) 0.4 MG CAPS capsule TK 2 CS PO ONCE D  11  . Tiotropium Bromide-Olodaterol (STIOLTO RESPIMAT) 2.5-2.5 MCG/ACT AERS Inhale 2 puffs into the lungs daily. 3 Inhaler 3   No current facility-administered medications for this visit.     ROS: See HPI for pertinent positives and negatives.   Physical Examination  Vitals:   07/03/17 1011 07/03/17 1012  BP: (!) 144/82 (!) 141/81  Pulse: 70   Resp: 18   Temp: (!) 97.2 F (36.2 C)   TempSrc: Oral   SpO2: 96%   Weight: 152 lb 4.8 oz (69.1 kg)    Body mass index is 24.77 kg/m.  General: A&O x 3, WDWN. Gait: normal Eyes: PERRLA. Pulmonary: Respirations are non labored, CTAB, without wheezes, rales or rhonchi. Cardiac: regular rhythm, nodetected murmur.     Carotid Bruits Left Right   Negative Negative   Abdominal aortic pusle is not palpable. Radial pulses: are 2+ palpable and =.   VASCULAR EXAM: Extremitieswithout ischemic changes  without Gangrene; without open wounds.     LE Pulses LEFT RIGHT   FEMORAL  palpable not palpable    POPLITEAL not palpable  not palpable   POSTERIOR TIBIAL  1+palpable   not palpable    DORSALIS PEDIS  ANTERIOR TIBIAL not palpable  2+ palpable    Abdomen: soft, NT, no palpable masses. Skin: red macular psoriasis patches on anterior lower legs has resolved, no ulcers. Musculoskeletal: no muscle  wasting or atrophy. Neurologic: A&O X 3; appropriate affect ; SENSATION: normal; MOTOR FUNCTION: moving all extremities equally, motor strength 5/5 throughout. Speech is fluent/normal. CN 2-12 intact. Fine tremor in outstretched fingers.    ASSESSMENT: Jeffrey Meadows is a 83 y.o. male whois s/p left distal SFA popliteal artery bypass graft in March 2006. He has no claudication with walking, no signs of ischemia in his feet/legs.  His walking is limited by a painful left foot bone spur, see Plan.   Low velocities (13-41 cm/s) in left LE arterial duplex, bi and monophasic waveforms, and normal bilateral TBI's  DATA  Left LE Arterial Duplex (07/03/17): Mixed  plaque throughout the left LE.  Patent left fem-pop bypass graft with no restenosis, velocities range from 13-41 cm/s.  Somewhat difficult exam due to difficulty distinguishing native vessels from graft.  50-74% stenosis in the left mid SFA. Stenosis noted proximally since prior exam on 09-13-16.    ABI (Date: 07/03/2017):  R:   ABI: [place] (was [place] on 09-13-16),   PT: bi  DP: bi  TBI:  0.86 (was 0.88)  L:   ABI: 1.57 ([place] (was [place]),   PT: tri  DP: bi  TBI: 0.76 (was 0.64) Stable bilateral ABI with non compressible vessels, with bi and triphasic waveforms, and normal bilateral TBI.   Plan: Resume 81 mg ASA daily.  Follow-up in 74monthswith ABI's and left LE arterial duplex. He is unable to participate in a graduated walking program due to the bone spur on the ball of his left foot causing pain with walking. He will use his stationary bike for a total of 30 minutes daily and daily seated leg exercises as discussed and demonstrated.  I discussed in depth with the patient the nature of atherosclerosis, and emphasized the importance of maximal medical management including strict control of blood pressure, blood glucose, and lipid levels, obtaining regular exercise, and continued cessation of smoking.  The  patient is aware that without maximal medical management the underlying atherosclerotic disease process will progress, limiting the benefit of any interventions.  The patient was given information about PAD including signs, symptoms, treatment, what symptoms should prompt the patient to seek immediate medical care, and risk reduction measures to take.  Clemon Chambers, RN, MSN, FNP-C [facility] Phone: [phone]  Clinic MD: Trula Slade on call  07/03/17 10:16 AM

## 2017-07-03 NOTE — Patient Instructions (Signed)

## 2017-07-20 ENCOUNTER — Other Ambulatory Visit: Payer: Self-pay

## 2017-07-20 ENCOUNTER — Ambulatory Visit: Payer: Medicare Other | Admitting: Vascular Surgery

## 2017-07-20 ENCOUNTER — Encounter: Payer: Self-pay | Admitting: Vascular Surgery

## 2017-07-20 ENCOUNTER — Encounter: Payer: Self-pay | Admitting: *Deleted

## 2017-07-20 VITALS — BP 134/78 | HR 71 | Temp 98.0°F | Resp 18 | Ht 67.0 in | Wt 148.0 lb

## 2017-07-20 DIAGNOSIS — Z95828 Presence of other vascular implants and grafts: Secondary | ICD-10-CM | POA: Diagnosis not present

## 2017-07-20 NOTE — Progress Notes (Signed)
Patient ID: SIRE POET, male   DOB: 22-Sep-1932, 83 y.o.   MRN: [id number]  Reason for Consult: PAD (f/u)   Referred by [name], MD  Subjective:     HPI:  Jeffrey Meadows is a 83 y.o. male with a history of a left distal SFA to popliteal artery bypass in 2006.  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  He does not have any other known aneurysms.  He was previously on aspirin but has stopped it.  He takes no blood thinners.  He has no lower extremity symptoms at this time.  I have called him back into our office to discuss his low velocities on his duplex.  He has not had any tissue loss or ulceration.  He does not have stroke in his history or TIA.  He is a former smoker.  Past Medical History:  Diagnosis Date  . Adenomatous polyp of colon 08/2001   removed hepatic flexure  . Arthritis   . Cataract   . Chronic back pain    stenosis/scoliosis/synovial cyst  . Emphysema lung ([place])   . Enlarged prostate    takes Proscar daily  . History of blood transfusion    no abnormal reaction noted  . History of bronchitis    about 3-40yrs ago   . Hyperlipidemia    takes Atorvastatin daily  . Hypertension    takes Amlodipine and HCTZ daily  . Joint pain   . Joint swelling   . On home oxygen therapy    at night  . Raynaud disease   . Urinary frequency    takes Flomax daily  . Villous adenoma of colon 11/.2001   2 cm removed cecum   Family History  Problem Relation Age of Onset  . Heart failure Mother   . Heart disease Mother   . Aneurysm Father    Past Surgical History:  Procedure Laterality Date  . cataract surgery    . CYSTOSCOPY WITH LITHOLAPAXY N/A 01/11/2016   Procedure: CYSTOSCOPY WITH LITHOLAPAXY;  Surgeon: Festus Aloe, MD;  Location: [facility];  Service: Urology;  Laterality: N/A;  . GREEN LIGHT LASER TURP (TRANSURETHRAL RESECTION OF PROSTATE N/A 01/11/2016   Procedure: GREEN LIGHT LASER TURP (TRANSURETHRAL RESECTION OF PROSTATE;  Surgeon: Festus Aloe, MD;  Location: [facility];  Service: Urology;  Laterality: N/A;  . HERNIA REPAIR Left    inguinal  . HOLMIUM LASER APPLICATION N/A 1/47/8295   Procedure: HOLMIUM LASER APPLICATION;  Surgeon: Festus Aloe, MD;  Location: [facility];  Service: Urology;  Laterality: N/A;  . JOINT REPLACEMENT     Right Hip  . LAMINECTOMY Right 07/02/2014   Procedure: Right Lumbar two-three Laminectomy for Synovial Cyst;  Surgeon: Erline Levine, MD;  Location: [facility];  Service: Neurosurgery;  Laterality: Right;  . POPLITEAL VENOUS ANEURYSM REPAIR Left 2010   about 6 yrs ago  . PROSTATE BIOPSY  4/95  . SPINE SURGERY  Jan. 2016   Dr. Vickie Epley  . TONSILLECTOMY    . TOTAL HIP ARTHROPLASTY  1952   right    Short Social History:  Social History   Tobacco Use  . Smoking status: Former Smoker    Packs/day: 1.50    Years: 40.00    Pack years: 60.00    Types: Cigarettes  . Smokeless tobacco: Never Used  . Tobacco comment: quit smoking about 22yrs ago  Substance Use Topics  . Alcohol use: Yes    Comment: 3-5 glasses  of wine daily    Allergies  Allergen Reactions  . Antihistamines, Diphenhydramine-Type Other (See Comments)    Enlarged prostate    Current Outpatient Medications  Medication Sig Dispense Refill  . albuterol (PROAIR HFA) 108 (90 Base) MCG/ACT inhaler Inhale 1-2 puffs into the lungs every 4 (four) hours as needed. as needed shortness of breath prior to exercise 1 Inhaler 11  . AMBULATORY NON FORMULARY MEDICATION Ketoprofen 20% cream 1 Tube 2  . amLODipine (NORVASC) 10 MG tablet TAKE ONE TABLET BY MOUTH DAILY 90 tablet 3  . atorvastatin (LIPITOR) 20 MG tablet TAKE 1 TABLET BY MOUTH DAILY AT 6PM 90 tablet 3  . calcipotriene (DOVONOX) 0.005 % ointment Apply 1 application topically 2 (two) times daily. Applies to affected area.  3  . Cholecalciferol (VITAMIN D) 2000 UNITS CAPS Take 2,000 Units by mouth daily.     . hydrochlorothiazide (HYDRODIURIL) 25 MG tablet TAKE ONE TABLET BY  MOUTH EVERY MORNING 90 tablet 3  . KLOR-CON 10 10 MEQ tablet TAKE ONE TABLET BY MOUTH ONE TIME DAILY 90 tablet 3  . Multiple Vitamin (MULTIVITAMIN WITH MINERALS) TABS tablet Take 1 tablet by mouth daily.    . Omega-3 Fatty Acids (FISH OIL PO) Take 2,000 Units by mouth daily.     . polyethylene glycol powder (GLYCOLAX/MIRALAX) powder Take 17 g by mouth 2 (two) times daily as needed. 3350 g 11  . predniSONE (DELTASONE) 10 MG tablet 4 tabs x 2 days, 3 tabs x 2 days, 2 tabs x 2 days, 1 tab x 2 days then stop 20 tablet 0  . tamsulosin (FLOMAX) 0.4 MG CAPS capsule TK 2 CS PO ONCE D  11  . Tiotropium Bromide-Olodaterol (STIOLTO RESPIMAT) 2.5-2.5 MCG/ACT AERS Inhale 2 puffs into the lungs daily. 3 Inhaler 3   No current facility-administered medications for this visit.     Review of Systems  Constitutional:  Constitutional negative. HENT: HENT negative.  Eyes: Eyes negative.  Respiratory: Respiratory negative.  Cardiovascular: Cardiovascular negative.  GI: Gastrointestinal negative.  Musculoskeletal: Musculoskeletal negative.  Skin: Skin negative.  Neurological: Neurological negative. Hematologic: Hematologic/lymphatic negative.  Psychiatric: Psychiatric negative.        Objective:  Objective   Vitals:   07/20/17 0845  BP: 134/78  Pulse: 71  Resp: 18  Temp: 98 F (36.7 C)  TempSrc: Oral  SpO2: 98%  Weight: 148 lb (67.1 kg)  Height: 5\' 7"  (1.702 m)   Body mass index is 23.18 kg/m.  Physical Exam  Constitutional: He is oriented to person, place, and time. He appears well-developed.  HENT:  Head: Normocephalic.  Eyes: Pupils are equal, round, and reactive to light.  Neck: Normal range of motion.  Cardiovascular: Normal rate.  Pulses:      Femoral pulses are 2+ on the right side, and 2+ on the left side.      Popliteal pulses are 2+ on the right side.  Strong left PT signal  Pulmonary/Chest: Effort normal.  Abdominal: Soft. He exhibits no mass.  Genitourinary: Penis  normal.  Musculoskeletal: Normal range of motion. He exhibits no edema.  Neurological: He is alert and oriented to person, place, and time.  Skin: Skin is warm and dry.  Psychiatric: He has a normal mood and affect. His behavior is normal. Judgment and thought content normal.    Data: I again reviewed his ABIs which were noncompressible bilaterally as well as his duplex which demonstrated significantly low velocities as far down as 13 in the proximal graft.  These were performed on 07/03/2017.     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  He does not have a palpable aortic aneurysm his right popliteal artery appears normal as well.  He does have very low velocities in his left lower extremity bypass graft and I have offered him angiogram possible intervention.  He demonstrates good understanding we will get that set up in the near future.  I have also asked him to start taking at least a baby aspirin.     Waynetta Sandy MD [facility]

## 2017-07-31 ENCOUNTER — Telehealth: Payer: Self-pay | Admitting: Internal Medicine

## 2017-07-31 NOTE — Telephone Encounter (Signed)
Spoke with Jeffrey Meadows, his wife states he uses the oxygen compressor at night only but feels he needs to increase his liter flow at night. I advised that CY would want to do another ONO to test his levels at night to make sure he is getting adequate oxygen at night. She made an appt for CY first available to discuss this. FYI CY.

## 2017-07-31 NOTE — Telephone Encounter (Signed)
While they wait for that appointment, it will be ok for him to increase his home O2 at night from 2 L to 3L.

## 2017-07-31 NOTE — Telephone Encounter (Signed)
Called pt and advised message from the provider. Pt understood and verbalized understanding. Nothing further is needed.    

## 2017-08-03 ENCOUNTER — Other Ambulatory Visit: Payer: Self-pay | Admitting: *Deleted

## 2017-08-15 ENCOUNTER — Ambulatory Visit (HOSPITAL_COMMUNITY)
Admission: RE | Admit: 2017-08-15 | Discharge: 2017-08-15 | Disposition: A | Discharge status: Home / Self Care | Payer: Medicare Other | Source: Ambulatory Visit | Attending: Vascular Surgery | Admitting: Vascular Surgery

## 2017-08-15 ENCOUNTER — Encounter (HOSPITAL_COMMUNITY)
Admission: RE | Disposition: A | Discharge status: Home / Self Care | Payer: Self-pay | Source: Ambulatory Visit | Attending: Vascular Surgery

## 2017-08-15 ENCOUNTER — Encounter (HOSPITAL_COMMUNITY): Payer: Self-pay | Admitting: Vascular Surgery

## 2017-08-15 DIAGNOSIS — Z9981 Dependence on supplemental oxygen: Secondary | ICD-10-CM | POA: Insufficient documentation

## 2017-08-15 DIAGNOSIS — Z86010 Personal history of colonic polyps: Secondary | ICD-10-CM | POA: Insufficient documentation

## 2017-08-15 DIAGNOSIS — M549 Dorsalgia, unspecified: Secondary | ICD-10-CM | POA: Insufficient documentation

## 2017-08-15 DIAGNOSIS — Z96641 Presence of right artificial hip joint: Secondary | ICD-10-CM | POA: Insufficient documentation

## 2017-08-15 DIAGNOSIS — Z888 Allergy status to other drugs, medicaments and biological substances status: Secondary | ICD-10-CM | POA: Insufficient documentation

## 2017-08-15 DIAGNOSIS — Z9889 Other specified postprocedural states: Secondary | ICD-10-CM | POA: Insufficient documentation

## 2017-08-15 DIAGNOSIS — Z86718 Personal history of other venous thrombosis and embolism: Secondary | ICD-10-CM | POA: Insufficient documentation

## 2017-08-15 DIAGNOSIS — G8929 Other chronic pain: Secondary | ICD-10-CM | POA: Insufficient documentation

## 2017-08-15 DIAGNOSIS — E785 Hyperlipidemia, unspecified: Secondary | ICD-10-CM | POA: Insufficient documentation

## 2017-08-15 DIAGNOSIS — J439 Emphysema, unspecified: Secondary | ICD-10-CM | POA: Diagnosis not present

## 2017-08-15 DIAGNOSIS — Z8249 Family history of ischemic heart disease and other diseases of the circulatory system: Secondary | ICD-10-CM | POA: Insufficient documentation

## 2017-08-15 DIAGNOSIS — Z95820 Peripheral vascular angioplasty status with implants and grafts: Secondary | ICD-10-CM | POA: Diagnosis not present

## 2017-08-15 DIAGNOSIS — N401 Enlarged prostate with lower urinary tract symptoms: Secondary | ICD-10-CM | POA: Insufficient documentation

## 2017-08-15 DIAGNOSIS — R350 Frequency of micturition: Secondary | ICD-10-CM | POA: Diagnosis not present

## 2017-08-15 DIAGNOSIS — Z79899 Other long term (current) drug therapy: Secondary | ICD-10-CM | POA: Insufficient documentation

## 2017-08-15 DIAGNOSIS — Z87891 Personal history of nicotine dependence: Secondary | ICD-10-CM | POA: Diagnosis not present

## 2017-08-15 DIAGNOSIS — I998 Other disorder of circulatory system: Secondary | ICD-10-CM | POA: Insufficient documentation

## 2017-08-15 DIAGNOSIS — M1990 Unspecified osteoarthritis, unspecified site: Secondary | ICD-10-CM | POA: Insufficient documentation

## 2017-08-15 DIAGNOSIS — Z9849 Cataract extraction status, unspecified eye: Secondary | ICD-10-CM | POA: Diagnosis not present

## 2017-08-15 DIAGNOSIS — I739 Peripheral vascular disease, unspecified: Secondary | ICD-10-CM | POA: Diagnosis not present

## 2017-08-15 DIAGNOSIS — I7300 Raynaud's syndrome without gangrene: Secondary | ICD-10-CM | POA: Diagnosis not present

## 2017-08-15 HISTORY — PX: ABDOMINAL AORTOGRAM: CATH118222

## 2017-08-15 HISTORY — PX: LOWER EXTREMITY ANGIOGRAPHY: CATH118251

## 2017-08-15 LAB — POCT I-STAT, CHEM 8
BUN: 15 mg/dL (ref 6–20)
Calcium, Ion: 1.23 mmol/L (ref 1.15–1.40)
Chloride: 101 mmol/L (ref 101–111)
Creatinine, Ser: 0.7 mg/dL (ref 0.61–1.24)
Glucose, Bld: 104 mg/dL — ABNORMAL HIGH (ref 65–99)
HCT: 48 % (ref 39.0–52.0)
Hemoglobin: 16.3 g/dL (ref 13.0–17.0)
Potassium: 3.2 mmol/L — ABNORMAL LOW (ref 3.5–5.1)
Sodium: 142 mmol/L (ref 135–145)
TCO2: 29 mmol/L (ref 22–32)

## 2017-08-15 SURGERY — Surgical Case
Anesthesia: *Unknown

## 2017-08-15 SURGERY — LOWER EXTREMITY ANGIOGRAPHY
Anesthesia: LOCAL

## 2017-08-15 MED ORDER — FENTANYL CITRATE (PF) 100 MCG/2ML IJ SOLN
INTRAMUSCULAR | Status: DC | PRN
  Administered 2017-08-15: 25 ug via INTRAVENOUS

## 2017-08-15 MED ORDER — MIDAZOLAM HCL 2 MG/2ML IJ SOLN
INTRAMUSCULAR | Status: DC | PRN
  Administered 2017-08-15: 1 mg via INTRAVENOUS

## 2017-08-15 MED ORDER — FENTANYL CITRATE (PF) 100 MCG/2ML IJ SOLN
INTRAMUSCULAR | Status: AC
  Filled 2017-08-15: qty 2

## 2017-08-15 MED ORDER — HYDRALAZINE HCL 20 MG/ML IJ SOLN: 5.0000 mg | INTRAMUSCULAR | Status: DC | PRN

## 2017-08-15 MED ORDER — IODIXANOL 320 MG/ML IV SOLN
INTRAVENOUS | Status: DC | PRN
  Administered 2017-08-15: 100 mL via INTRAVENOUS

## 2017-08-15 MED ORDER — SODIUM CHLORIDE 0.9 % WEIGHT BASED INFUSION: 1.0000 mL/kg/h | INTRAVENOUS | Status: DC

## 2017-08-15 MED ORDER — SODIUM CHLORIDE 0.9 % IV SOLN: 250.0000 mL | INTRAVENOUS | Status: DC | PRN

## 2017-08-15 MED ORDER — LIDOCAINE HCL (PF) 1 % IJ SOLN
INTRAMUSCULAR | Status: DC | PRN
  Administered 2017-08-15: 15 mL

## 2017-08-15 MED ORDER — SODIUM CHLORIDE 0.9 % IV SOLN
INTRAVENOUS | Status: DC
  Administered 2017-08-15: 07:00:00 via INTRAVENOUS

## 2017-08-15 MED ORDER — SODIUM CHLORIDE 0.9% FLUSH: 3.0000 mL | INTRAVENOUS | Status: DC | PRN

## 2017-08-15 MED ORDER — OXYCODONE HCL 5 MG PO TABS: 5.0000 mg | ORAL_TABLET | ORAL | Status: DC | PRN

## 2017-08-15 MED ORDER — LABETALOL HCL 5 MG/ML IV SOLN: 10.0000 mg | INTRAVENOUS | Status: DC | PRN

## 2017-08-15 MED ORDER — HEPARIN (PORCINE) IN NACL 2-0.9 UNIT/ML-% IJ SOLN
INTRAMUSCULAR | Status: AC | PRN
  Administered 2017-08-15: 1000 mL

## 2017-08-15 MED ORDER — MIDAZOLAM HCL 2 MG/2ML IJ SOLN
INTRAMUSCULAR | Status: AC
  Filled 2017-08-15: qty 2

## 2017-08-15 MED ORDER — LIDOCAINE HCL (PF) 1 % IJ SOLN
INTRAMUSCULAR | Status: AC
  Filled 2017-08-15: qty 30

## 2017-08-15 MED ORDER — SODIUM CHLORIDE 0.9% FLUSH: 3.0000 mL | Freq: Two times a day (BID) | INTRAVENOUS | Status: DC

## 2017-08-15 MED ORDER — HEPARIN (PORCINE) IN NACL 2-0.9 UNIT/ML-% IJ SOLN
INTRAMUSCULAR | Status: AC
  Filled 2017-08-15: qty 1000

## 2017-08-15 SURGICAL SUPPLY — 11 items
CATH ANGIO 5F BER2 65CM (CATHETERS) ×1 IMPLANT
CATH OMNI FLUSH 5F 65CM (CATHETERS) ×1 IMPLANT
COVER PRB 48X5XTLSCP FOLD TPE (BAG) IMPLANT
COVER PROBE 5X48 (BAG) ×3
KIT MICROINTRODUCER STIFF 5F (SHEATH) ×1 IMPLANT
KIT PV (KITS) ×3 IMPLANT
SHEATH PINNACLE 5F 10CM (SHEATH) ×1 IMPLANT
SYR MEDRAD MARK V 150ML (SYRINGE) ×3 IMPLANT
TRANSDUCER W/STOPCOCK (MISCELLANEOUS) ×3 IMPLANT
TRAY PV CATH (CUSTOM PROCEDURE TRAY) ×3 IMPLANT
WIRE BENTSON .035X145CM (WIRE) ×1 IMPLANT

## 2017-08-15 NOTE — Progress Notes (Signed)
Notified Dr Donzetta Matters of K 3.2.  No new orders

## 2017-08-15 NOTE — Discharge Instructions (Signed)

## 2017-08-15 NOTE — H&P (Signed)
   History and Physical Update  The patient was interviewed and re-examined.  The patient's previous History and Physical has been reviewed and is unchanged from previous office visit. Plan for left lower extremity angiogram with possible intervention.   Candelaria Pies C. Donzetta Matters, MD [facility] [place] Office: [phone] Pager: [phone]  08/15/2017, 7:59 AM

## 2017-08-15 NOTE — Progress Notes (Addendum)
Site area: RFA Site Prior to Removal:  Level 0 Pressure Applied For: 20 min Manual:   yes Patient Status During Pull:  stable Post Pull Site:  Level 0 Post Pull Instructions Given:  yes Post Pull Pulses Present: weak palpable rt DP Dressing Applied:  tegaderm Bedrest begins @ 0930 till 1330 Comments:

## 2017-08-15 NOTE — Op Note (Signed)
    Patient name: TREMAYNE SHELDON MRN: [id number] DOB: 01-06-1933 Sex: male  08/15/2017 Pre-operative Diagnosis: left lower extremity decreased velocities Post-operative diagnosis:  Same Surgeon:  Erlene Quan C. Donzetta Matters, MD Procedure Performed: 1.  US guided cannulation of right common femoral artery 2.  Aortogram with bilateral lower extremity runoff 3.  Moderate sedation with fentanyl and versed 23 minutes  Indications:  84yo male with history of left popliteal aneurysm exclusion and bypass in 2006. He now has diminished flow in the bypass with concern for inflow disease.  Findings: by duplex the right common femoral artery is moderately diseased. The aortic bifurcation is narrowed and the bilateral common iliac arteries are at least 50% stenosed. The left sfa has disease in mulitple areas at least 60%. The bypass graft itself is patent and free of anastomotic narrowing. Bilaterally he has 3 vessels running off to the foot.    Procedure:  The patient was identified in the holding area and taken to room 8.  The patient was then placed supine on the table and prepped and draped in the usual sterile fashion.  A time out was called.  Ultrasound was used to evaluate the right common femoral artery.  It was patent .  A digital ultrasound image was acquired.  A micropuncture needle was used to access the right common femoral artery under ultrasound guidance.  An 018 wire was advanced without resistance and a micropuncture sheath was placed.  The 018 wire was removed and a benson wire was placed.  The micropuncture sheath was exchanged for a 5 french sheath.  An omniflush catheter was advanced over the wire to the level of L-1.  An abdominal angiogram was obtained followed by bilateral runoff. With the above findings we elected not to intervene given that he does not have symptoms at this time. The catheter was removed over the wire. Sheath to be pulled in holding.   Contrast: 107cc    Inocencia Murtaugh C. Donzetta Matters,  MD [facility] [place] Office: [phone] Pager: [phone]

## 2017-08-20 ENCOUNTER — Telehealth: Payer: Self-pay

## 2017-08-20 NOTE — Telephone Encounter (Signed)
Patient's wife called states patient had an electrocardiogram and an angiogram on Wednesday of last week, the angiogram showed multiple arterial sclerosis, she said the cardiologist said said that patients doesn't need operation. Wife would like to know when can patient start exercising and is there any medicine that he can take for the "arterial sclerosis".

## 2017-08-20 NOTE — Telephone Encounter (Signed)
He had the angiogram of his peripheral arteries due to an abnormal duplex scan but they apparently found nothing to stent or fix. The decision of when to exercise again is up to Dr. Donzetta Matters. His cholesterol is normal and at his age, I would not start a statin medication for atherosclerosis.

## 2017-08-21 NOTE — Telephone Encounter (Signed)
Spoke with patient's wife, [name] and provided message.  She understands that they will need to reach out to Dr. Claretha Cooper office regarding the question of exercise.  She also understands that you do not plan to start the patient on any medication for this condition.

## 2017-08-22 ENCOUNTER — Telehealth: Payer: Self-pay | Admitting: Vascular Surgery

## 2017-08-22 NOTE — Telephone Encounter (Signed)
-----   Message from Mena Goes, RN sent at 08/16/2017 10:21 AM EST ----- Regarding: 6 months   ----- Message ----- From: Waynetta Sandy, MD Sent: 08/15/2017   9:19 AM To: [address]  Jeffrey Meadows [id number] [birth year]  08/15/2017 Pre-operative Diagnosis: left lower extremity decreased velocities  Surgeon:  Eda Paschal. Donzetta Matters, MD  Procedure Performed: 1.  US guided cannulation of right common femoral artery 2.  Aortogram with bilateral lower extremity runoff 3.  Moderate sedation with fentanyl and versed 23 minutes  F/u in 6 months with left lower extremity bypass graft duplex and abi

## 2017-08-22 NOTE — Telephone Encounter (Signed)
Sched appt 02/22/18; lab at 1:00 and MD at 2:00. Mailed appt letter.

## 2017-09-25 ENCOUNTER — Telehealth: Payer: Self-pay | Admitting: Internal Medicine

## 2017-09-25 NOTE — Telephone Encounter (Signed)
Tetanus immunizations are not covered by Medicare unless there is a cut or injury requiring it. They are expensive.

## 2017-09-25 NOTE — Telephone Encounter (Signed)
Left message letting patient know °

## 2017-09-25 NOTE — Telephone Encounter (Signed)
Pt would like to know if you would recommend that they get "Lock Jaw Shots"? Please advise.

## 2017-10-01 ENCOUNTER — Ambulatory Visit: Payer: Medicare Other | Admitting: Internal Medicine

## 2017-10-01 ENCOUNTER — Encounter: Payer: Self-pay | Admitting: Internal Medicine

## 2017-10-01 VITALS — BP 118/80 | HR 69 | Ht 68.0 in | Wt 151.6 lb

## 2017-10-01 DIAGNOSIS — J432 Centrilobular emphysema: Secondary | ICD-10-CM | POA: Diagnosis not present

## 2017-10-01 DIAGNOSIS — J9611 Chronic respiratory failure with hypoxia: Secondary | ICD-10-CM

## 2017-10-01 DIAGNOSIS — J449 Chronic obstructive pulmonary disease, unspecified: Secondary | ICD-10-CM

## 2017-10-01 NOTE — Patient Instructions (Addendum)
Order- Walk test on room air  O2 qualifying     Dx COPD mixed type  We can continue current meds  We can continue O2 3L for sleep  Order- referral to Pulmonary Rehab    Dx GOLD D  COPD mixed type  Order- add POC 2L portable O2 for exertion.  Keep appointment

## 2017-10-01 NOTE — Progress Notes (Signed)
HPI male former smoker (60 pack years) followed for COPD/emphysema, complicated by BPH PFT- 94/[id number] obstructive airways disease with insignificant response to bronchodilator, emphysema pattern. Air-trapping with increased residual volume. Diffusion moderately reduced. FVC 3.03/78%, FEV11.11/44%, FEV1/FVC 0.37. TLC 115%, RV 137%, DLCO 52%. 6MWT- 10/04/2012-92%, 83%, 91%, 336 m. Significant desaturation with exertion  ------------------------------------------------------------------- 01/31/17- 84 year old male former smoker (60 pack year) followed for emphysema, complicated by BPH Wife here FOLLOWS FOR: Breathing is doing well since last OV. Denies chest tighntess, wheezing, SOB or coughing. He and his wife feel he is stable with no respiratory exacerbations. There has been no major change in his exercise tolerance and he does continue effort to exercise for stamina. Little routine cough or wheeze. No discolored sputum or exertional chest pain.  07/31/18- 84 year old male former smoker (60 pack year) followed for emphysema, complicated by BPH Wife here ---COPD mixed type: Pt's wife states patient has hard time with breathing-was unsure if they needed to increased QHS O2.  Stiolto, albuterol hfa Using O2 3 L for sleep only.  Wife comments he is more short of breath.  He does not seem to recognize it.  He is comfortable with current meds-little or no cough/wheeze. CXR 01/31/17 IMPRESSION: Underlying emphysematous change with upper lobe bullous disease. Scarring left upper lobe and lung bases. No edema or consolidation. Stable cardiac silhouette. There is aortic atherosclerosis. Walk Test on room air 10/01/17-desaturated to 85% BiLAP 3 with HR 94.  Corrected on oxygen 2 L saturation 97%.  ROS-see HPI + = positive Constitutional:   No-   weight loss, night sweats, fevers, chills, fatigue, lassitude. HEENT:   No-  headaches, difficulty swallowing, tooth/dental problems, sore throat,   No-  sneezing, itching, ear ache, nasal congestion, post nasal drip,  CV:  No-   chest pain, orthopnea, PND, swelling in lower extremities, anasarca,  +dizziness, palpitations Resp: +  shortness of breath with exertion or at rest.             No- productive cough,  No non-productive cough,  No- coughing up of blood.              No-   change in color of mucus.  No- wheezing.   Skin: No-   rash or lesions. GI:  No-   heartburn, indigestion, abdominal pain, nausea, vomiting, GU: MS:  No-   joint pain or swelling. . Neuro-     nothing unusual Psych:  No- change in mood or affect. No depression or anxiety.  No memory loss.  OBJ- Physical Exam    General- Alert, Oriented, Affect-appropriate, Distress- none acute, trim Skin- rash-none, lesions- none, excoriation- none, pale Lymphadenopathy- none Head- atraumatic            Eyes- Gross vision intact, PERRLA, conjunctivae and secretions clear            Ears- Hearing, canals-normal            Nose- Clear, no-Septal dev, mucus, polyps, erosion, perforation             Throat- Mallampati III , mucosa clear , drainage- none, tonsils- atrophic. No thrush Neck- flexible , trachea midline, no stridor , thyroid nl, carotid no bruit Chest - symmetrical excursion , unlabored           Heart/CV- RRR , no murmur , no gallop  , no rub, nl s1 s2                           -  JVD- none , edema- none, stasis changes- none, varices- none           Lung- +distant but clear to P&A, wheeze- none, cough-none , dullness-none, rub- none.            Chest wall-  Abd-  Br/ Gen/ Rectal- Not done, not indicated Extrem- cyanosis- none, clubbing, none, atrophy- none, strength- nl Neuro- grossly intact to observation

## 2017-10-02 DIAGNOSIS — J9611 Chronic respiratory failure with hypoxia: Secondary | ICD-10-CM | POA: Insufficient documentation

## 2017-10-02 NOTE — Assessment & Plan Note (Signed)
He has had oxygen for sleep.  He qualifies to have portable oxygen.  I think he is strong enough to handle small portable concentrator at 2 L. Plan-education on home oxygen.  Start portable oxygen 2 L pulse

## 2017-10-02 NOTE — Assessment & Plan Note (Signed)
Severe COPD mixed type, emphysema predominant.  Oxygen dependent sleep and exertion.  He is comfortable with medications.  His BPH is tolerating LAMA/ Stiolto. He does not need an inhaled steroid. Plan-I have recommended pulmonary dilatation

## 2017-10-03 ENCOUNTER — Telehealth (HOSPITAL_COMMUNITY): Payer: Self-pay

## 2017-10-03 ENCOUNTER — Telehealth: Payer: Self-pay | Admitting: Internal Medicine

## 2017-10-03 NOTE — Telephone Encounter (Signed)
He qualifies for portable O2, but doesn't want to deal with it. Recommend he continue with current portable system. We can change to POC when he is eligible.

## 2017-10-03 NOTE — Telephone Encounter (Signed)
Attempted to call patient in regards to Pulmonary rehab - lm on vm °

## 2017-10-03 NOTE — Telephone Encounter (Signed)
Patients insurance is active and benefits verified through [facility] - $20.00 co-pay, no deductible, out of pocket amount of $3,300/$55.24 has been met, no co-insurance, and no pre-authorization is required. Spoke with [facility] - Reference [phone]

## 2017-10-03 NOTE — Telephone Encounter (Signed)
Called [facility] and spoke with [facility] who stated Jeffrey Meadows was at lunch.  Stated to her the information per Dr. Annamaria Boots regarding pt.  If Jeffrey Meadows needed anything else from [place], they will have her return our call.  Will go ahead and close this open encounter.

## 2017-10-03 NOTE — Telephone Encounter (Signed)
Spoke with Kern Reap at [place], states that per insurance regulations any patient with nocturnal O2 that has daytime O2 added must use O2 tanks for 1 month prior to being able to receive a POC.  Kern Reap states that she called pt to discuss this, pt was very upset with this and told Kern Reap to "cancel the whole order".  Kern Reap is requesting that CY be made aware and would like his recs and his order prior to cancelling pt's O2 at his request.  CY FYI- please advise on how to proceed.  Thanks!

## 2017-10-09 ENCOUNTER — Telehealth (HOSPITAL_COMMUNITY): Payer: Self-pay

## 2017-10-09 NOTE — Telephone Encounter (Signed)
Called to speak with patient in regards to Pulmonary rehab - patient stated this program will not work out. Very firm. Closed referral.

## 2017-11-01 ENCOUNTER — Other Ambulatory Visit: Payer: Self-pay | Admitting: Internal Medicine

## 2017-11-12 ENCOUNTER — Other Ambulatory Visit: Payer: Self-pay | Admitting: Internal Medicine

## 2017-12-19 ENCOUNTER — Other Ambulatory Visit: Payer: Self-pay | Admitting: Internal Medicine

## 2018-01-31 ENCOUNTER — Encounter: Payer: Self-pay | Admitting: Internal Medicine

## 2018-01-31 ENCOUNTER — Ambulatory Visit (INDEPENDENT_AMBULATORY_CARE_PROVIDER_SITE_OTHER)
Admission: RE | Admit: 2018-01-31 | Discharge: 2018-01-31 | Disposition: A | Discharge status: Home / Self Care | Payer: Medicare Other | Source: Ambulatory Visit | Attending: Internal Medicine | Admitting: Internal Medicine

## 2018-01-31 ENCOUNTER — Telehealth: Payer: Self-pay | Admitting: Internal Medicine

## 2018-01-31 ENCOUNTER — Ambulatory Visit: Payer: Medicare Other | Admitting: Internal Medicine

## 2018-01-31 VITALS — BP 122/74 | HR 73 | Ht 68.0 in | Wt 152.2 lb

## 2018-01-31 DIAGNOSIS — G8929 Other chronic pain: Secondary | ICD-10-CM

## 2018-01-31 DIAGNOSIS — J439 Emphysema, unspecified: Secondary | ICD-10-CM | POA: Diagnosis not present

## 2018-01-31 DIAGNOSIS — J449 Chronic obstructive pulmonary disease, unspecified: Secondary | ICD-10-CM

## 2018-01-31 DIAGNOSIS — M25551 Pain in right hip: Secondary | ICD-10-CM | POA: Diagnosis not present

## 2018-01-31 MED ORDER — THEOPHYLLINE ER 200 MG PO TB12: 200.0000 mg | ORAL_TABLET | Freq: Two times a day (BID) | ORAL | 12 refills | Status: DC

## 2018-01-31 NOTE — Progress Notes (Signed)
HPI male former smoker (60 pack years) followed for COPD/emphysema, complicated by BPH, PAD/ ASCVD PFT- 10/04/2012-severe obstructive airways disease with insignificant response to bronchodilator, emphysema pattern. Air-trapping with increased residual volume. Diffusion moderately reduced. FVC 3.03/78%, FEV11.11/44%, FEV1/FVC 0.37. TLC 115%, RV 137%, DLCO 52%. 6MWT- 10/04/2012-92%, 83%, 91%, 336 m. Significant desaturation with exertion Walk Test on room air 10/01/17-desaturated to 85% by LAP 3 with HR 94.  Corrected on oxygen 2 L saturation 97%. ------------------------------------------------------------------- 07/31/18- 84 year old male former smoker (60 pack year) followed for emphysema, complicated by BPH Wife here ---COPD mixed type: Pt's wife states patient has hard time with breathing-was unsure if they needed to increased QHS O2.  Stiolto, albuterol hfa Using O2 3 L for sleep only.  Wife comments he is more short of breath.  He does not seem to recognize it.  He is comfortable with current meds-little or no cough/wheeze. CXR 01/31/17 IMPRESSION: Underlying emphysematous change with upper lobe bullous disease. Scarring left upper lobe and lung bases. No edema or consolidation. Stable cardiac silhouette. There is aortic atherosclerosis. Walk Test on room air 10/01/17-desaturated to 85% by LAP 3 with HR 94.  Corrected on oxygen 2 L saturation 97%.  01/31/2018- 84 year old male former smoker (60 pack year) followed for emphysema, complicated by BPH, PAD/ ASCVD O2 2L sleep/ [facility] -----COPD mixed type: DME: [facility] for O2 QHS. Pt notes SOB with exertion but subsides when resting. Stiolto Respimat, Proair hfa,  He insists he does not need portable oxygen.  Rides exercycle for 15 or 20 minutes 3 days a week.  Sleeps every night with oxygen.  Paces himself.  ROS-see HPI + = positive Constitutional:   No-   weight loss, night sweats, fevers, chills, fatigue, lassitude. HEENT:   No-  headaches,  difficulty swallowing, tooth/dental problems, sore throat,       No-  sneezing, itching, ear ache, nasal congestion, post nasal drip,  CV:  No-   chest pain, orthopnea, PND, swelling in lower extremities, anasarca,  +dizziness, palpitations Resp: +  shortness of breath with exertion or at rest.             No- productive cough,  No non-productive cough,  No- coughing up of blood.              No-   change in color of mucus.  No- wheezing.   Skin: No-   rash or lesions. GI:  No-   heartburn, indigestion, abdominal pain, nausea, vomiting, GU: MS:  No-   joint pain or swelling. . Neuro-     nothing unusual Psych:  No- change in mood or affect. No depression or anxiety.  No memory loss.  OBJ- Physical Exam    General- Alert, Oriented, Affect-appropriate, Distress- none acute, trim Skin- rash-none, lesions- none, excoriation- none, pale Lymphadenopathy- none Head- atraumatic            Eyes- Gross vision intact, PERRLA, conjunctivae and secretions clear            Ears- Hearing, canals-normal            Nose- Clear, no-Septal dev, mucus, polyps, erosion, perforation             Throat- Mallampati III , mucosa clear , drainage- none, tonsils- atrophic. No thrush Neck- flexible , trachea midline, no stridor , thyroid nl, carotid no bruit Chest - symmetrical excursion , unlabored           Heart/CV- RRR , no murmur , no gallop  ,  no rub, nl s1 s2                           - JVD- none , edema- none, stasis changes- none, varices- none           Lung- +distant but clear to P&A, wheeze- none, cough-none , dullness-none, rub- none.            Chest wall-  Abd-  Br/ Gen/ Rectal- Not done, not indicated Extrem- cyanosis- none, clubbing, none, atrophy- none, strength- nl Neuro- grossly intact to observation

## 2018-01-31 NOTE — Telephone Encounter (Signed)
Called and spoke with pt's wife [name] letting her know the results of pt's cxr from today.  [name] expressed understanding. Nothing further needed.

## 2018-01-31 NOTE — Assessment & Plan Note (Signed)
Advanced COPD may not respond much to additional medication.  We talked about trying theophylline to see what that could do.  He did not find Trelegy helpful. Plan-try adding theophylline 200 mg twice daily or as available.

## 2018-01-31 NOTE — Patient Instructions (Signed)
We are going to try adding theophylline as an additional bronchodilator  Order- CXR    Dx COPD mixed type  Please call if we can help

## 2018-01-31 NOTE — Assessment & Plan Note (Signed)
Hip pain limits aerobic activity to some extent which helps him with pacing adjusted to his breathing.

## 2018-02-12 ENCOUNTER — Other Ambulatory Visit: Payer: Self-pay | Admitting: Internal Medicine

## 2018-02-18 ENCOUNTER — Telehealth: Payer: Self-pay | Admitting: Internal Medicine

## 2018-02-18 IMAGING — CR DG THORACOLUMBAR SPINE 2V
2 series · 2 of 2 positions shown · non-contrast
Comparison: Chest x-ray of August 04, 2015 and coronal and
sagittal CT images from a scan of December 20, 2015

CLINICAL DATA: Status post fall backwards 2 days ago with
persistent mid back pain with limited range of motion

EXAM:
THORACOLUMBAR SPINE - 2 VIEW

[w thoraco-lumbar junction ap]
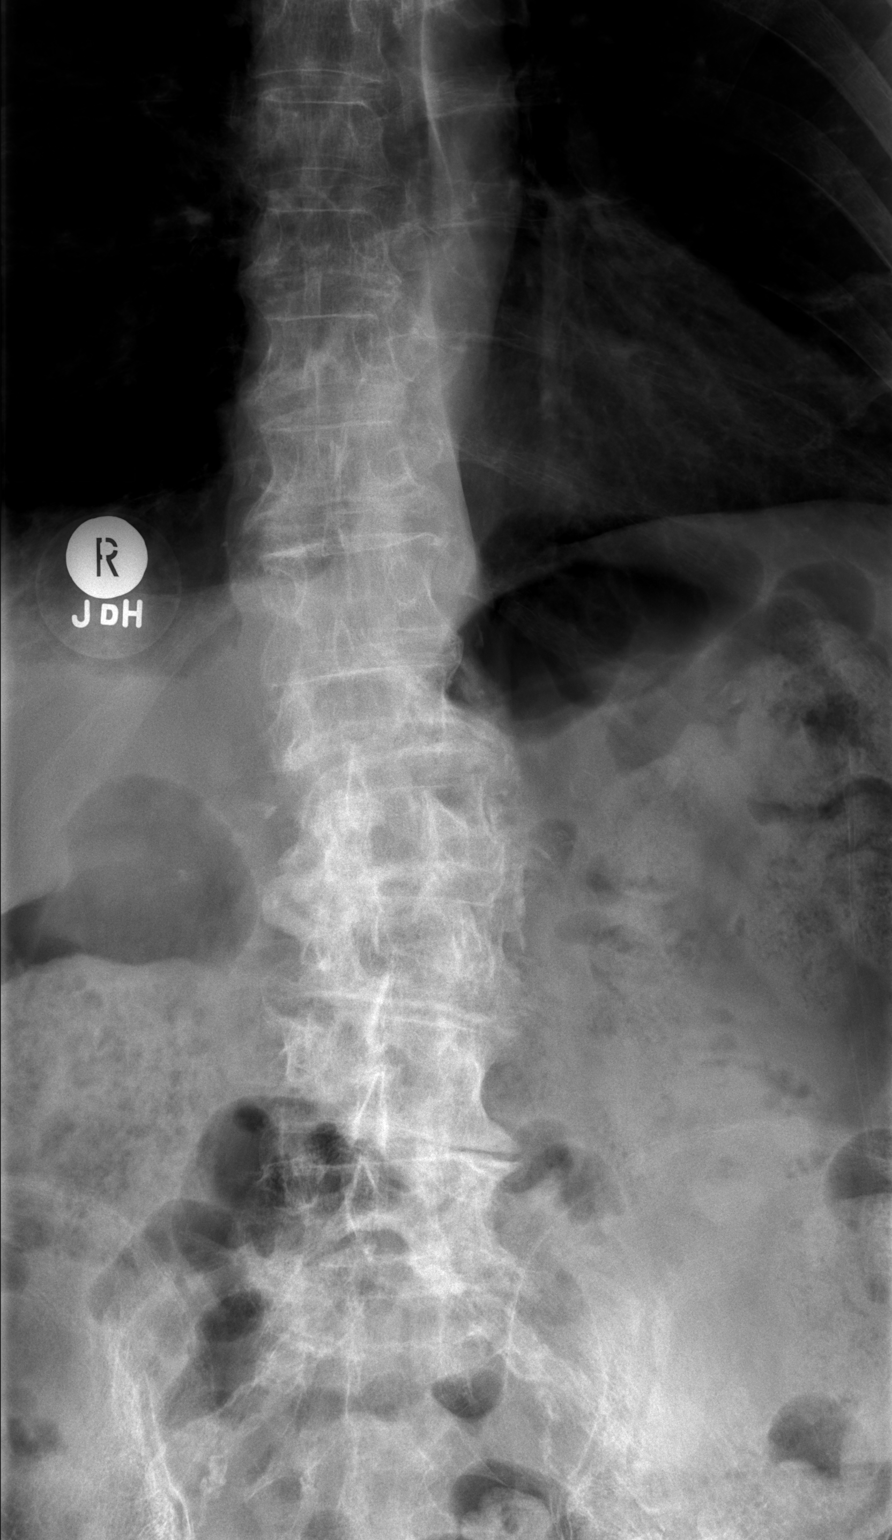

[w thoraco-lumbar junction lat]
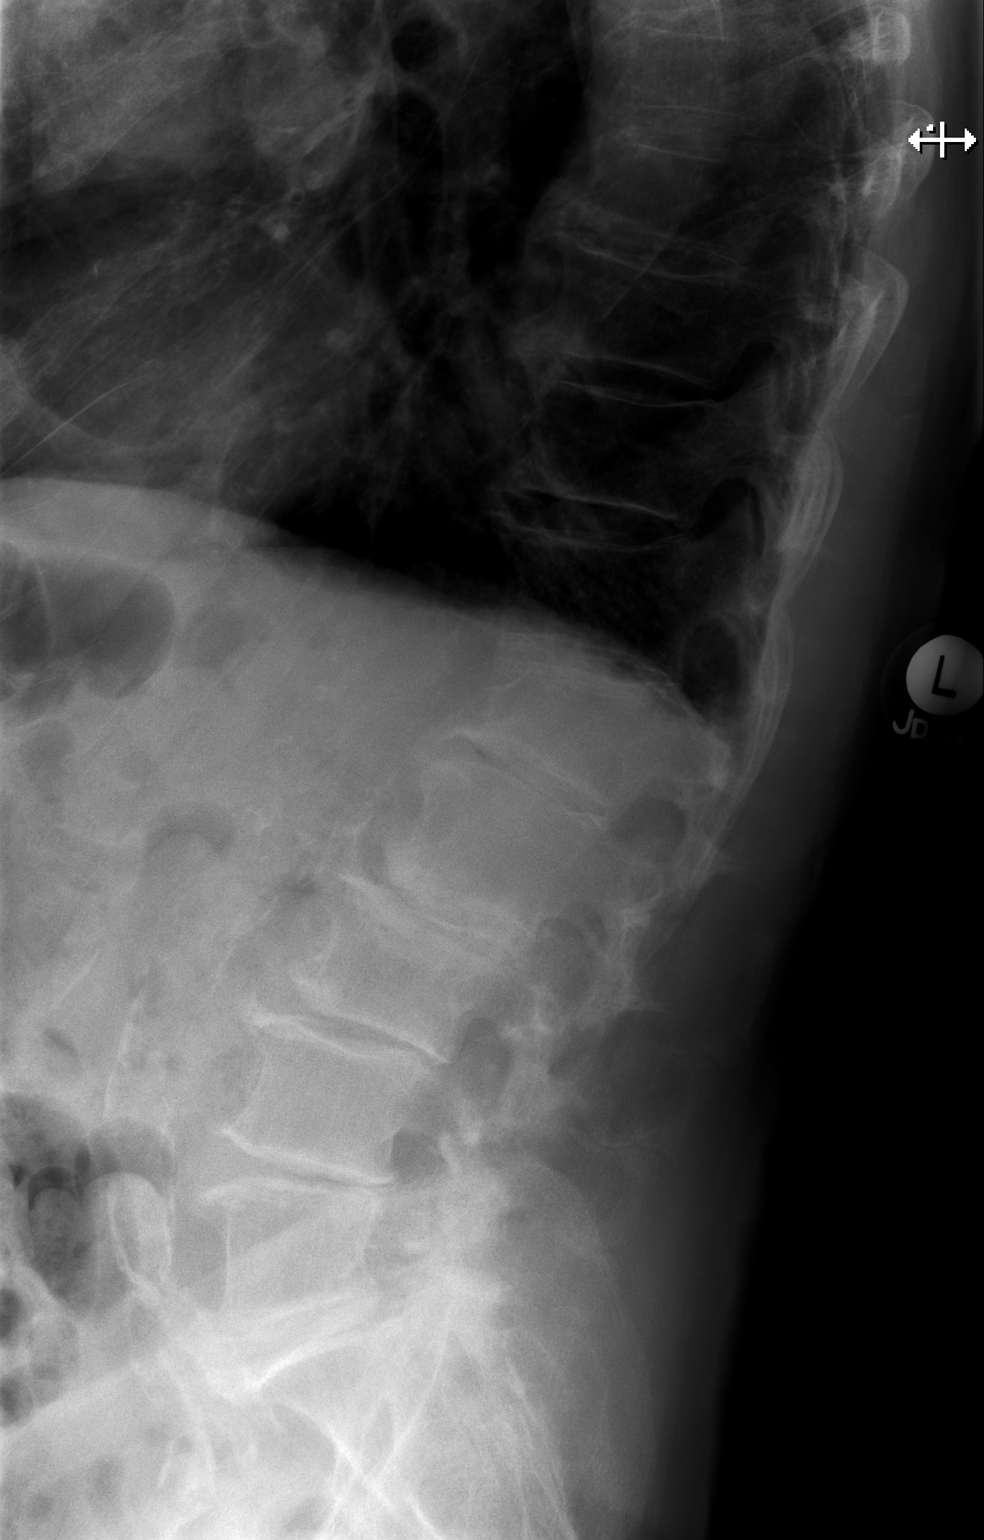

[2 of 2 positions shown; findings below may reference images not displayed]

FINDINGS: There is new wedge compression of the body of L1 with loss of height
anteriorly of 20%. The lower thoracic vertebral bodies are preserved
in height. There is stable levocurvature of the lumbar spine
centered at L2-3. There is multilevel degenerative disc disease of
the lumbar spine.

There is calcification in the wall of the abdominal aorta.
IMPRESSION: New 20% anterior wedge compression of L1 has occurred since the December 20, 2015 CT scan. Multilevel degenerative disc disease of the lumbar
spine. Multilevel endplate spurring of the lower thoracic spine.

Abdominal aortic atherosclerosis.

## 2018-02-19 NOTE — Telephone Encounter (Signed)
Will close encounter

## 2018-02-22 ENCOUNTER — Ambulatory Visit: Payer: Medicare Other | Admitting: Vascular Surgery

## 2018-02-22 ENCOUNTER — Encounter (HOSPITAL_COMMUNITY): Payer: Medicare Other

## 2018-02-22 ENCOUNTER — Other Ambulatory Visit (HOSPITAL_COMMUNITY): Payer: Medicare Other

## 2018-02-27 ENCOUNTER — Encounter: Payer: Self-pay | Admitting: Internal Medicine

## 2018-03-01 ENCOUNTER — Encounter: Payer: Self-pay | Admitting: Vascular Surgery

## 2018-03-01 ENCOUNTER — Ambulatory Visit (INDEPENDENT_AMBULATORY_CARE_PROVIDER_SITE_OTHER): Payer: Medicare Other | Admitting: Vascular Surgery

## 2018-03-01 ENCOUNTER — Ambulatory Visit (INDEPENDENT_AMBULATORY_CARE_PROVIDER_SITE_OTHER)
Admission: RE | Admit: 2018-03-01 | Discharge: 2018-03-01 | Disposition: A | Discharge status: Home / Self Care | Payer: Medicare Other | Source: Ambulatory Visit | Attending: Family | Admitting: Family

## 2018-03-01 ENCOUNTER — Ambulatory Visit (HOSPITAL_COMMUNITY)
Admission: RE | Admit: 2018-03-01 | Discharge: 2018-03-01 | Disposition: A | Discharge status: Home / Self Care | Payer: Medicare Other | Source: Ambulatory Visit | Attending: Vascular Surgery | Admitting: Vascular Surgery

## 2018-03-01 ENCOUNTER — Other Ambulatory Visit: Payer: Self-pay

## 2018-03-01 VITALS — BP 131/73 | HR 70 | Temp 97.8°F | Resp 16 | Ht 68.0 in | Wt 153.0 lb

## 2018-03-01 DIAGNOSIS — Z87891 Personal history of nicotine dependence: Secondary | ICD-10-CM

## 2018-03-01 DIAGNOSIS — Z95828 Presence of other vascular implants and grafts: Secondary | ICD-10-CM | POA: Diagnosis not present

## 2018-03-01 DIAGNOSIS — I779 Disorder of arteries and arterioles, unspecified: Secondary | ICD-10-CM | POA: Diagnosis not present

## 2018-03-01 NOTE — Progress Notes (Signed)
Patient ID: CEEJAY KEGLEY, male   DOB: 1933/06/08, 84 y.o.   MRN: [id number]  Reason for Consult: PAD (25mth F/u ABI, LE bypass graft L.  S/p Aortogram bilat LE runoff. )   Referred by [name], MD  Subjective:     HPI:  PRUITT TABOADA is a 84 y.o. male with history of a left distal SFA to popliteal artery bypass graft in 2006.  This was done for aneurysm.  He has had known stenosis and underwent angiogram that did not demonstrate any flow limitation other than in his native SFA.  He continues to do well has no lower extremity complaints.  He is not currently taking any antiplatelets have asked him to start taking aspirin.  Past Medical History:  Diagnosis Date  . Adenomatous polyp of colon 08/2001   removed hepatic flexure  . Arthritis   . Cataract   . Chronic back pain    stenosis/scoliosis/synovial cyst  . Enlarged prostate    takes Proscar daily  . History of blood transfusion    no abnormal reaction noted  . Hyperlipidemia    takes Atorvastatin daily  . Hypertension    takes Amlodipine and HCTZ daily  . Joint pain   . On home oxygen therapy    at night  . Raynaud disease   . Urinary frequency    takes Flomax daily  . Villous adenoma of colon 11/.2001   2 cm removed cecum   Family History  Problem Relation Age of Onset  . Heart failure Mother   . Heart disease Mother   . Aneurysm Father    Past Surgical History:  Procedure Laterality Date  . ABDOMINAL AORTOGRAM N/A 08/15/2017   Procedure: ABDOMINAL AORTOGRAM;  Surgeon: Waynetta Sandy, MD;  Location: [facility];  Service: Cardiovascular;  Laterality: N/A;  . cataract surgery    . CYSTOSCOPY WITH LITHOLAPAXY N/A 01/11/2016   Procedure: CYSTOSCOPY WITH LITHOLAPAXY;  Surgeon: Festus Aloe, MD;  Location: [facility];  Service: Urology;  Laterality: N/A;  . GREEN LIGHT LASER TURP (TRANSURETHRAL RESECTION OF PROSTATE N/A 01/11/2016   Procedure: GREEN LIGHT LASER TURP (TRANSURETHRAL RESECTION OF  PROSTATE;  Surgeon: Festus Aloe, MD;  Location: [facility];  Service: Urology;  Laterality: N/A;  . HERNIA REPAIR Left    inguinal  . HOLMIUM LASER APPLICATION N/A 03/24/9146   Procedure: HOLMIUM LASER APPLICATION;  Surgeon: Festus Aloe, MD;  Location: [facility];  Service: Urology;  Laterality: N/A;  . JOINT REPLACEMENT     Right Hip  . LAMINECTOMY Right 07/02/2014   Procedure: Right Lumbar two-three Laminectomy for Synovial Cyst;  Surgeon: Erline Levine, MD;  Location: [facility];  Service: Neurosurgery;  Laterality: Right;  . LOWER EXTREMITY ANGIOGRAPHY Bilateral 08/15/2017   Procedure: LOWER EXTREMITY ANGIOGRAPHY;  Surgeon: Waynetta Sandy, MD;  Location: [facility];  Service: Cardiovascular;  Laterality: Bilateral;  . POPLITEAL VENOUS ANEURYSM REPAIR Left 2010   about 6 yrs ago  . PROSTATE BIOPSY  4/95  . SPINE SURGERY  Jan. 2016   Dr. Vickie Epley  . TONSILLECTOMY    . TOTAL HIP ARTHROPLASTY  1952   right    Short Social History:  Social History   Tobacco Use  . Smoking status: Former Smoker    Packs/day: 1.50    Years: 40.00    Pack years: 60.00    Types: Cigarettes  . Smokeless tobacco: Never Used  . Tobacco comment: quit smoking about 50yrs  ago  Substance Use Topics  . Alcohol use: Yes    Comment: 3-5 glasses of wine daily    Allergies  Allergen Reactions  . Antihistamines, Diphenhydramine-Type Other (See Comments)    Enlarged prostate    Current Outpatient Medications  Medication Sig Dispense Refill  . albuterol (PROAIR HFA) 108 (90 Base) MCG/ACT inhaler Inhale 1-2 puffs into the lungs every 4 (four) hours as needed. as needed shortness of breath prior to exercise (Patient taking differently: Inhale 2 puffs into the lungs every 4 (four) hours as needed. as needed shortness of breath prior to exercise) 1 Inhaler 11  . AMBULATORY NON FORMULARY MEDICATION Ketoprofen 20% cream 1 Tube 2  . amLODipine (NORVASC) 10 MG tablet TAKE ONE TABLET BY MOUTH  DAILY 90 tablet 3  . aspirin EC 81 MG tablet Take 81 mg by mouth daily.    Marland Kitchen atorvastatin (LIPITOR) 20 MG tablet TAKE 1 TABLET BY MOUTH DAILY AT 6PM 90 tablet 3  . calcipotriene (DOVONOX) 0.005 % ointment Apply 1 application topically daily. Applies to affected area.  3  . Cholecalciferol (VITAMIN D) 2000 UNITS CAPS Take 2,000 Units by mouth 2 (two) times a week.     . hydrochlorothiazide (HYDRODIURIL) 25 MG tablet TAKE ONE TABLET BY MOUTH EVERY MORNING 90 tablet 3  . hydrocortisone cream 1 % Apply 1 application topically daily as needed for itching.    Marland Kitchen KLOR-CON 10 10 MEQ tablet TAKE ONE TABLET BY MOUTH ONE TIME DAILY 90 tablet 3  . Multiple Vitamin (MULTIVITAMIN WITH MINERALS) TABS tablet Take 1 tablet by mouth 2 (two) times a week.     . naproxen (NAPROSYN) 500 MG tablet Take 500 mg by mouth 2 (two) times daily with a meal.    . polyethylene glycol powder (GLYCOLAX/MIRALAX) powder Take 17 g by mouth 2 (two) times daily as needed. (Patient taking differently: Take 17 g by mouth 2 (two) times daily as needed for moderate constipation. ) 3350 g 11  . STIOLTO RESPIMAT 2.5-2.5 MCG/ACT AERS INHALE 2 PUFFS INTO THE LUNGS DAILY. 1 Inhaler 6  . tamsulosin (FLOMAX) 0.4 MG CAPS capsule Take 0.8 mg by mouth once daily  11  . theophylline (THEODUR) 200 MG 12 hr tablet Take 1 tablet (200 mg total) by mouth 2 (two) times daily. With meals 60 tablet 12   No current facility-administered medications for this visit.     Review of Systems  Constitutional:  Constitutional negative. HENT: HENT negative.  Eyes: Eyes negative.  Respiratory: Respiratory negative.  Cardiovascular: Cardiovascular negative.  GI: Gastrointestinal negative.  Musculoskeletal: Musculoskeletal negative.  Skin: Skin negative.  Neurological: Neurological negative. Hematologic: Hematologic/lymphatic negative.  Psychiatric: Psychiatric negative.        Objective:  Objective   Vitals:   03/01/18 1555  BP: 131/73  Pulse: 70    Resp: 16  Temp: 97.8 F (36.6 C)  TempSrc: Oral  SpO2: 97%  Weight: 153 lb (69.4 kg)  Height: 5\' 8"  (1.727 m)   Body mass index is 23.26 kg/m.  Physical Exam  Constitutional: He is oriented to person, place, and time.  Neck: Normal range of motion.  Cardiovascular: Normal rate.  Pulses:      Radial pulses are 2+ on the right side.       Femoral pulses are 2+ on the right side, and 2+ on the left side.      Popliteal pulses are 2+ on the right side, and 2+ on the left side.  Pulmonary/Chest: Effort normal.  Abdominal: Soft. He exhibits no mass.  Musculoskeletal: He exhibits edema.  Neurological: He is alert and oriented to person, place, and time.  Skin: Skin is warm and dry.  Psychiatric: He has a normal mood and affect. His behavior is normal. Judgment and thought content normal.    Data: I have independently interpreted his ABIs to be triphasic on the right 1.69 on the left monophasic 1.69 with toe pressures right 127 left 116.  I also interpreted his lower extremity duplex which demonstrates monophasic flow throughout velocities down as far as 12.     Assessment/Plan:     84 year old male follows up for evaluation of his left SFA to popliteal bypass.  He has incredibly diminished velocities we have already performed angiogram which did not demonstrate any areas to intervene.  From the standpoint I will just follow him up in 1 year with repeat studies should he not have issues before that.  He demonstrates good understanding.     Waynetta Sandy MD [facility]

## 2018-03-10 ENCOUNTER — Telehealth: Payer: Self-pay | Admitting: Internal Medicine

## 2018-03-10 ENCOUNTER — Encounter: Payer: Self-pay | Admitting: Internal Medicine

## 2018-03-10 NOTE — Telephone Encounter (Signed)
Wife called for patient. Apparently has had diarrhea for a few days. No fever. Some malaise. She is concerned because they have upcoming trip to [place] later this week. She has Lomotil and asks if she cangive him this med. I think he can take it sparingly but should go on liquid diet/bland diet  And see if symptoms resilve.

## 2018-03-11 ENCOUNTER — Telehealth: Payer: Self-pay

## 2018-03-11 MED ORDER — DIPHENOXYLATE-ATROPINE 2.5-0.025 MG PO TABS: 1.0000 | ORAL_TABLET | Freq: Two times a day (BID) | ORAL | 0 refills | Status: DC | PRN

## 2018-03-11 NOTE — Telephone Encounter (Signed)
Patients wife called requesting an appointment for patient for tomorrow for diarrhea he has no chills, fever or vomiting. She said he spoke with you over the weekend about this. Appt scheduled for tomorrow afternoon. She would like to know if you can prescribe lomotil in the meantime?

## 2018-03-11 NOTE — Telephone Encounter (Signed)
There is no need for appointment unless he is dehydrated. This may take 7-10 days to resolve. Their trip may not be pleasant. It is best NOT to take Lomotil. How many stools a day is he having? Is there blood in stool? Fever?

## 2018-03-11 NOTE — Addendum Note (Signed)
Addended by: Mady Haagensen on: 03/11/2018 05:23 PM   Modules accepted: Orders

## 2018-03-11 NOTE — Telephone Encounter (Signed)
Called patient and spoke with wife as patient is sleeping.  Wife states that patient is taking 4 Lomitil per day.  She doesn't know how many stools per day he is having, she doesn't think that he's having any blood in the stool.  She doesn't think he's having any fever.  I've asked her to have a conversation with the patient when he wakes up and given her specific questions to ask the patient.    I told her that there's really no reason to come in unless he is dehydrated.  States that he only drinks enough water to take his pills.  He will drink alcohol and coffee.  Advised that he should drink less alcohol at this time and try to stick to clear liquids until the diarrhea resolves.    Wife advised that he has a Reading Tour in [place] that begins on Thursday and he will be back on Sunday.  States that he may have to do this Tour on Lomotil.  Advised her that this could take 7-10 days to get over.    Wife will call me back in the morning to provide an update on his condition and to give me answers on the questions that Dr. Renold Genta has listed below.

## 2018-03-12 ENCOUNTER — Ambulatory Visit: Payer: Medicare Other | Admitting: Internal Medicine

## 2018-03-12 MED ORDER — DIPHENOXYLATE-ATROPINE 2.5-0.025 MG PO TABS: 1.0000 | ORAL_TABLET | Freq: Two times a day (BID) | ORAL | 0 refills | Status: DC | PRN

## 2018-03-12 NOTE — Addendum Note (Signed)
Addended by: Mady Haagensen on: 03/12/2018 09:02 AM   Modules accepted: Orders

## 2018-03-12 NOTE — Telephone Encounter (Signed)
Wife called back with updated this morning stating that patient is feeling much better this morning.  States that he does not have any blood in his stools.  She still cannot tell me how many stools he is having per day.  He is taking 3-4 Lomotil per day "when he can remember to take them".  Advised that we have called him in a Rx for this for every 12 hours.  He should not take them too often.    She states that he doesn't feel he needs to come in for an office visit today; he is planning to go on his Reading Tour Thursday.  She will call on Monday is he/she feels he needs to be seen upon his return from [place] on Monday.    Asked the wife to please make sure that he stays well hydrated and still continues to limit his diet in alcohol and advance his diet slowly to allow his gut to get over whatever is going on.  She verbalized understanding of our conversation.

## 2018-03-21 ENCOUNTER — Ambulatory Visit: Payer: Medicare Other | Admitting: Internal Medicine

## 2018-03-21 ENCOUNTER — Ambulatory Visit
Admission: RE | Admit: 2018-03-21 | Discharge: 2018-03-21 | Disposition: A | Discharge status: Home / Self Care | Payer: Medicare Other | Source: Ambulatory Visit | Attending: Internal Medicine | Admitting: Internal Medicine

## 2018-03-21 ENCOUNTER — Encounter: Payer: Self-pay | Admitting: Internal Medicine

## 2018-03-21 VITALS — BP 120/74 | HR 76 | Ht 68.0 in | Wt 151.0 lb

## 2018-03-21 DIAGNOSIS — S2231XA Fracture of one rib, right side, initial encounter for closed fracture: Secondary | ICD-10-CM

## 2018-03-21 DIAGNOSIS — S60362A Insect bite (nonvenomous) of left thumb, initial encounter: Secondary | ICD-10-CM

## 2018-03-21 DIAGNOSIS — W57XXXA Bitten or stung by nonvenomous insect and other nonvenomous arthropods, initial encounter: Secondary | ICD-10-CM

## 2018-03-21 DIAGNOSIS — W19XXXA Unspecified fall, initial encounter: Secondary | ICD-10-CM

## 2018-03-21 MED ORDER — TRIAMCINOLONE ACETONIDE 0.1 % EX CREA: 1.0000 "application " | TOPICAL_CREAM | Freq: Two times a day (BID) | CUTANEOUS | 0 refills | Status: DC

## 2018-03-21 MED ORDER — HYDROCODONE-ACETAMINOPHEN 5-325 MG PO TABS: 1.0000 | ORAL_TABLET | ORAL | 0 refills | Status: DC | PRN

## 2018-03-21 NOTE — Progress Notes (Signed)
   Subjective:    Patient ID: Jeffrey Meadows, male    DOB: 08-Aug-1933, 84 y.o.   MRN: [id number]  HPI  84 year old fell recently on his deck.  He was reading on the deck, subsequently  got up and apparently an acorn was underneath his foot causing him to fall.  He is sore along his right rib cage area.  He is finding it difficult to take a deep breath.  He has COPD.  Also apparently an insect bit him along the dorsal aspect of his left thumb.  Last week he had an issue with diarrhea but it subsequently resolved with conservative treatment.    Review of Systems see above     Objective:   Physical Exam He has a small papule on dorsum of proximal left thumb that is erythematous with surrounding erythema.  This is consistent with an insect bite.  He has contused area right lower lateral rib cage.  His chest is clear.       Assessment & Plan:  Trauma to right lateral rib cage secondary to fall.  X-ray of right ribs.  Prescribed hydrocodone APAP 5/325 sparingly for pain.  Insect bite left thumb-prescribed triamcinolone cream 0.1% 3 times daily topically.  Addendum: Has fractured right ninth rib.  It will likely take 6 weeks to heal.  May take pain medication as needed.

## 2018-03-21 NOTE — Patient Instructions (Signed)
Take hydrocodone APAP sparingly for right rib cage pain.  Apply triamcinolone cream to insect bite left thumb.

## 2018-03-28 ENCOUNTER — Encounter: Payer: Self-pay | Admitting: Internal Medicine

## 2018-03-28 ENCOUNTER — Telehealth: Payer: Self-pay | Admitting: Internal Medicine

## 2018-03-28 NOTE — Telephone Encounter (Signed)
Pt took colace for 2 nights and now having stool seep out. Thinks he is constipated. Recently had diarrhea and took Lomotil. Is going to try enema. May need to be seen and checked for impaction if not improved tomorrow.

## 2018-04-04 ENCOUNTER — Other Ambulatory Visit: Payer: Self-pay | Admitting: Internal Medicine

## 2018-05-11 ENCOUNTER — Other Ambulatory Visit: Payer: Self-pay | Admitting: Internal Medicine

## 2018-05-24 ENCOUNTER — Other Ambulatory Visit: Payer: Self-pay | Admitting: Internal Medicine

## 2018-05-24 DIAGNOSIS — R972 Elevated prostate specific antigen [PSA]: Secondary | ICD-10-CM

## 2018-05-24 DIAGNOSIS — M8580 Other specified disorders of bone density and structure, unspecified site: Secondary | ICD-10-CM

## 2018-05-24 DIAGNOSIS — L409 Psoriasis, unspecified: Secondary | ICD-10-CM

## 2018-05-24 DIAGNOSIS — N400 Enlarged prostate without lower urinary tract symptoms: Secondary | ICD-10-CM

## 2018-05-24 DIAGNOSIS — M47812 Spondylosis without myelopathy or radiculopathy, cervical region: Secondary | ICD-10-CM

## 2018-05-24 DIAGNOSIS — M25552 Pain in left hip: Secondary | ICD-10-CM

## 2018-05-24 DIAGNOSIS — Z0000 Encounter for general adult medical examination without abnormal findings: Secondary | ICD-10-CM

## 2018-05-24 DIAGNOSIS — G8929 Other chronic pain: Secondary | ICD-10-CM

## 2018-05-28 ENCOUNTER — Other Ambulatory Visit: Payer: Medicare Other | Admitting: Internal Medicine

## 2018-05-28 DIAGNOSIS — L409 Psoriasis, unspecified: Secondary | ICD-10-CM

## 2018-05-28 DIAGNOSIS — M8580 Other specified disorders of bone density and structure, unspecified site: Secondary | ICD-10-CM

## 2018-05-28 DIAGNOSIS — Z0000 Encounter for general adult medical examination without abnormal findings: Secondary | ICD-10-CM

## 2018-05-28 DIAGNOSIS — M25552 Pain in left hip: Secondary | ICD-10-CM

## 2018-05-28 DIAGNOSIS — R972 Elevated prostate specific antigen [PSA]: Secondary | ICD-10-CM

## 2018-05-28 DIAGNOSIS — M47812 Spondylosis without myelopathy or radiculopathy, cervical region: Secondary | ICD-10-CM

## 2018-05-28 DIAGNOSIS — G8929 Other chronic pain: Secondary | ICD-10-CM

## 2018-05-28 DIAGNOSIS — N400 Enlarged prostate without lower urinary tract symptoms: Secondary | ICD-10-CM

## 2018-05-28 LAB — PSA: PSA: 20.8 ng/mL — ABNORMAL HIGH (ref <=4.0)

## 2018-05-28 LAB — CBC WITH DIFFERENTIAL/PLATELET
Basophils Absolute: 78 cells/uL (ref 0–200)
Basophils Relative: 1.1 %
Eosinophils Absolute: 185 cells/uL (ref 15–500)
Eosinophils Relative: 2.6 %
HCT: 45.1 % (ref 38.5–50.0)
Hemoglobin: 15.8 g/dL (ref 13.2–17.1)
Lymphs Abs: 1860 cells/uL (ref 850–3900)
MCH: 32.1 pg (ref 27.0–33.0)
MCHC: 35 g/dL (ref 32.0–36.0)
MCV: 91.7 fL (ref 80.0–100.0)
MPV: 9.9 fL (ref 7.5–12.5)
Monocytes Relative: 11.5 %
Neutro Abs: 4161 cells/uL (ref 1500–7800)
Neutrophils Relative %: 58.6 %
Platelets: 266 10*3/uL (ref 140–400)
RBC: 4.92 10*6/uL (ref 4.20–5.80)
RDW: 12.7 % (ref 11.0–15.0)
Total Lymphocyte: 26.2 %
WBC mixed population: 817 cells/uL (ref 200–950)
WBC: 7.1 10*3/uL (ref 3.8–10.8)

## 2018-05-28 LAB — LIPID PANEL
Cholesterol: 139 mg/dL (ref <200)
HDL: 61 mg/dL (ref >40)
LDL Cholesterol (Calc): 62 mg/dL (calc)
Non-HDL Cholesterol (Calc): 78 mg/dL (calc) (ref <130)
Total CHOL/HDL Ratio: 2.3 (calc) (ref <5.0)
Triglycerides: 82 mg/dL (ref <150)

## 2018-05-28 LAB — COMPLETE METABOLIC PANEL WITH GFR
AG Ratio: 2 (calc) (ref 1.0–2.5)
ALT: 17 U/L (ref 9–46)
AST: 18 U/L (ref 10–35)
Albumin: 4.1 g/dL (ref 3.6–5.1)
Alkaline phosphatase (APISO): 96 U/L (ref 40–115)
BUN: 14 mg/dL (ref 7–25)
CO2: 31 mmol/L (ref 20–32)
Calcium: 9.9 mg/dL (ref 8.6–10.3)
Chloride: 102 mmol/L (ref 98–110)
Creat: 0.74 mg/dL (ref 0.70–1.11)
GFR, Est African American: 99 mL/min/{1.73_m2} (ref >=60)
GFR, Est Non African American: 85 mL/min/{1.73_m2} (ref >=60)
Globulin: 2.1 g/dL (calc) (ref 1.9–3.7)
Glucose, Bld: 84 mg/dL (ref 65–99)
Potassium: 3.5 mmol/L (ref 3.5–5.3)
Sodium: 141 mmol/L (ref 135–146)
Total Bilirubin: 1.1 mg/dL (ref 0.2–1.2)
Total Protein: 6.2 g/dL (ref 6.1–8.1)

## 2018-05-30 ENCOUNTER — Ambulatory Visit (INDEPENDENT_AMBULATORY_CARE_PROVIDER_SITE_OTHER): Payer: Medicare Other | Admitting: Internal Medicine

## 2018-05-30 ENCOUNTER — Encounter: Payer: Self-pay | Admitting: Internal Medicine

## 2018-05-30 VITALS — BP 120/70 | HR 77 | Temp 98.3°F | Ht 68.0 in | Wt 152.0 lb

## 2018-05-30 DIAGNOSIS — Z8709 Personal history of other diseases of the respiratory system: Secondary | ICD-10-CM

## 2018-05-30 DIAGNOSIS — R972 Elevated prostate specific antigen [PSA]: Secondary | ICD-10-CM

## 2018-05-30 DIAGNOSIS — E876 Hypokalemia: Secondary | ICD-10-CM | POA: Diagnosis not present

## 2018-05-30 DIAGNOSIS — G8929 Other chronic pain: Secondary | ICD-10-CM

## 2018-05-30 DIAGNOSIS — I10 Essential (primary) hypertension: Secondary | ICD-10-CM | POA: Diagnosis not present

## 2018-05-30 DIAGNOSIS — M79672 Pain in left foot: Secondary | ICD-10-CM

## 2018-05-30 DIAGNOSIS — Z0000 Encounter for general adult medical examination without abnormal findings: Secondary | ICD-10-CM

## 2018-05-30 DIAGNOSIS — R8290 Unspecified abnormal findings in urine: Secondary | ICD-10-CM | POA: Diagnosis not present

## 2018-05-30 DIAGNOSIS — M25552 Pain in left hip: Secondary | ICD-10-CM

## 2018-05-30 DIAGNOSIS — Z87438 Personal history of other diseases of male genital organs: Secondary | ICD-10-CM

## 2018-05-30 LAB — POCT URINALYSIS DIPSTICK
Bilirubin, UA: NEGATIVE
Blood, UA: NEGATIVE
Glucose, UA: NEGATIVE
Ketones, UA: NEGATIVE
Nitrite, UA: NEGATIVE
Protein, UA: NEGATIVE
Spec Grav, UA: 1.01 (ref 1.010–1.025)
Urobilinogen, UA: 0.2 E.U./dL
pH, UA: 7.5 (ref 5.0–8.0)
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
## 2018-05-31 ENCOUNTER — Telehealth: Payer: Self-pay | Admitting: Internal Medicine

## 2018-05-31 LAB — PSA: PSA: 16.3 ng/mL — ABNORMAL HIGH (ref <=4.0)

## 2018-05-31 NOTE — Telephone Encounter (Signed)
LM for patient on home machine indicating his PSA lab results.  Also indicated that we have sent lab results over to Dr. Junious Silk at [facility].  He should be hearing from their office for an appointment soon.  If not by the end of next week, ask patient to call our office back to let us know.

## 2018-05-31 NOTE — Patient Instructions (Signed)
PSA was repeated today.  Patient is to restart potassium supplement because of hypokalemia.  Will be referred back to urologist for elevated PSA which is rather significant.  We did repeat PSA today to rule out error

## 2018-06-01 LAB — URINE CULTURE
MICRO NUMBER:: 91251007
SPECIMEN QUALITY:: ADEQUATE

## 2018-06-19 ENCOUNTER — Other Ambulatory Visit: Payer: Self-pay | Admitting: Internal Medicine

## 2018-06-25 ENCOUNTER — Ambulatory Visit: Payer: Medicare Other | Admitting: Internal Medicine

## 2018-06-25 ENCOUNTER — Telehealth: Payer: Self-pay

## 2018-06-25 NOTE — Telephone Encounter (Signed)
See thiafternoon

## 2018-06-25 NOTE — Telephone Encounter (Signed)
Patient's wife called wants to know if you can see Jeffrey Meadows this evening he cut his toe while trimming his toenails, she said that he has bad circulation in his legs and thinks he needs to be seen. Advised to have patient wash the area with mild soap and water and pat dry it once it's dried he can apply a bandage and keep it covered until I speak with you. She verbalized understanding.  Please advise

## 2018-06-25 NOTE — Telephone Encounter (Signed)
Called patient's wife to make an appt and she said that patient has changed his mind and does not want to be seen today.

## 2018-07-08 ENCOUNTER — Ambulatory Visit: Payer: Medicare Other | Admitting: Podiatry

## 2018-07-08 ENCOUNTER — Ambulatory Visit (INDEPENDENT_AMBULATORY_CARE_PROVIDER_SITE_OTHER): Payer: Medicare Other

## 2018-07-08 ENCOUNTER — Encounter: Payer: Self-pay | Admitting: Podiatry

## 2018-07-08 VITALS — BP 114/64

## 2018-07-08 DIAGNOSIS — M899 Disorder of bone, unspecified: Secondary | ICD-10-CM

## 2018-07-08 DIAGNOSIS — M79675 Pain in left toe(s): Secondary | ICD-10-CM | POA: Diagnosis not present

## 2018-07-08 DIAGNOSIS — I739 Peripheral vascular disease, unspecified: Secondary | ICD-10-CM

## 2018-07-08 DIAGNOSIS — B351 Tinea unguium: Secondary | ICD-10-CM | POA: Diagnosis not present

## 2018-07-08 DIAGNOSIS — L84 Corns and callosities: Secondary | ICD-10-CM

## 2018-07-08 DIAGNOSIS — M79674 Pain in right toe(s): Secondary | ICD-10-CM

## 2018-07-08 NOTE — Patient Instructions (Signed)

## 2018-07-24 ENCOUNTER — Ambulatory Visit: Payer: Self-pay

## 2018-07-24 ENCOUNTER — Other Ambulatory Visit: Payer: Self-pay | Admitting: Internal Medicine

## 2018-07-24 DIAGNOSIS — B351 Tinea unguium: Secondary | ICD-10-CM

## 2018-07-25 NOTE — Progress Notes (Signed)
Pt presents with mycotic infection of nails 1-5 bilateral.  All other systems are negative  Laser therapy administered to affected nails and tolerated well. All safety precautions were in place.  2nd treatment.  Follow up in 4 weeks     

## 2018-07-27 ENCOUNTER — Encounter: Payer: Self-pay | Admitting: Podiatry

## 2018-07-27 NOTE — Progress Notes (Signed)
Subjective: Jeffrey Meadows presents today with cc of painful calluses which interfere with daily activities that require ambulation. Pain is aggravated when wearing enclosed shoe gear. Pain is getting progressively worse. Patient is seeking professional help regarding symptoms. Pt also has h/o claudication and is followed by vascular for this.  Past Medical History:  Diagnosis Date  . Adenomatous polyp of colon 08/2001   removed hepatic flexure  . Arthritis   . Cataract   . Chronic back pain    stenosis/scoliosis/synovial cyst  . Enlarged prostate    takes Proscar daily  . History of blood transfusion    no abnormal reaction noted  . Hyperlipidemia    takes Atorvastatin daily  . Hypertension    takes Amlodipine and HCTZ daily  . Joint pain   . On home oxygen therapy    at night  . Raynaud disease   . Urinary frequency    takes Flomax daily  . Villous adenoma of colon 11/.2001   2 cm removed cecum    Patient Active Problem List   Diagnosis Date Noted  . Chronic respiratory failure with hypoxia ([place]) 10/02/2017  . Acute midline low back pain without sciatica 06/29/2016  . Chronic alcoholism ([place]) 06/29/2016  . Osteoarthritis of cervical spine 03/01/2016  . Strain of left trapezius muscle 10/21/2015  . Peripheral vascular disease, unspecified ([place]) 12/02/2013  . Essential tremor 10/14/2013  . Hoarseness of voice 04/12/2013  . Nasal septal deviation 07/16/2012  . Chronic foot pain, left 05/09/2012  . Claudication in peripheral vascular disease ([place]) 11/21/2011  . Constipation 03/09/2011  . Hearing impairment 03/09/2011  . Allergic rhinitis 02/02/2011  . Hip pain, chronic 06/08/2010  . Tobacco use disorder, severe, in sustained remission 03/18/2010  . BPH with obstruction/lower urinary tract symptoms 06/17/2008  . ANOMALY, CONGENITAL HYPOSPADIAS 04/23/2007  . Hyperlipidemia 10/11/2006  . HYPERTENSION, BENIGN SYSTEMIC 10/11/2006  . HEMORRHOIDS, NOS 10/11/2006  . COPD  with emphysema ([place]) 10/11/2006  . PSORIASIS 10/11/2006  . GAIT, ABNORMAL 10/11/2006    Past Surgical History:  Procedure Laterality Date  . ABDOMINAL AORTOGRAM N/A 08/15/2017   Procedure: ABDOMINAL AORTOGRAM;  Surgeon: Waynetta Sandy, MD;  Location: [facility];  Service: Cardiovascular;  Laterality: N/A;  . cataract surgery    . CYSTOSCOPY WITH LITHOLAPAXY N/A 01/11/2016   Procedure: CYSTOSCOPY WITH LITHOLAPAXY;  Surgeon: Festus Aloe, MD;  Location: [facility];  Service: Urology;  Laterality: N/A;  . GREEN LIGHT LASER TURP (TRANSURETHRAL RESECTION OF PROSTATE N/A 01/11/2016   Procedure: GREEN LIGHT LASER TURP (TRANSURETHRAL RESECTION OF PROSTATE;  Surgeon: Festus Aloe, MD;  Location: [facility];  Service: Urology;  Laterality: N/A;  . HERNIA REPAIR Left    inguinal  . HOLMIUM LASER APPLICATION N/A 09/17/5595   Procedure: HOLMIUM LASER APPLICATION;  Surgeon: Festus Aloe, MD;  Location: [facility];  Service: Urology;  Laterality: N/A;  . JOINT REPLACEMENT     Right Hip  . LAMINECTOMY Right 07/02/2014   Procedure: Right Lumbar two-three Laminectomy for Synovial Cyst;  Surgeon: Erline Levine, MD;  Location: [facility];  Service: Neurosurgery;  Laterality: Right;  . LOWER EXTREMITY ANGIOGRAPHY Bilateral 08/15/2017   Procedure: LOWER EXTREMITY ANGIOGRAPHY;  Surgeon: Waynetta Sandy, MD;  Location: [facility];  Service: Cardiovascular;  Laterality: Bilateral;  . POPLITEAL VENOUS ANEURYSM REPAIR Left 2010   about 6 yrs ago  . PROSTATE BIOPSY  4/95  . SPINE SURGERY  Jan. 2016   Dr. Vickie Epley  . TONSILLECTOMY    .  TOTAL HIP ARTHROPLASTY  1952   right     Current Outpatient Medications:  .  albuterol (PROAIR HFA) 108 (90 Base) MCG/ACT inhaler, Inhale 1-2 puffs into the lungs every 4 (four) hours as needed. as needed shortness of breath prior to exercise (Patient taking differently: Inhale 2 puffs into the lungs every 4 (four) hours as needed. as needed  shortness of breath prior to exercise), Disp: 1 Inhaler, Rfl: 11 .  AMBULATORY NON FORMULARY MEDICATION, Ketoprofen 20% cream, Disp: 1 Tube, Rfl: 2 .  amLODipine (NORVASC) 10 MG tablet, TAKE ONE TABLET BY MOUTH DAILY, Disp: 90 tablet, Rfl: 3 .  aspirin EC 81 MG tablet, Take 81 mg by mouth daily., Disp: , Rfl:  .  atorvastatin (LIPITOR) 20 MG tablet, TAKE 1 TABLET BY MOUTH DAILY AT 6PM, Disp: 90 tablet, Rfl: 3 .  calcipotriene (DOVONOX) 0.005 % ointment, Apply 1 application topically daily. Applies to affected area., Disp: , Rfl: 3 .  Cholecalciferol (VITAMIN D) 2000 UNITS CAPS, Take 2,000 Units by mouth 2 (two) times a week. , Disp: , Rfl:  .  diphenoxylate-atropine (LOMOTIL) 2.5-0.025 MG tablet, Take 1 tablet by mouth every 12 (twelve) hours as needed for diarrhea or loose stools., Disp: 30 tablet, Rfl: 0 .  doxycycline (VIBRAMYCIN) 100 MG capsule, Take 100 mg by mouth 2 (two) times daily., Disp: , Rfl: 0 .  hydrochlorothiazide (HYDRODIURIL) 25 MG tablet, TAKE ONE TABLET BY MOUTH EVERY MORNING, Disp: 90 tablet, Rfl: 3 .  HYDROcodone-acetaminophen (NORCO) 5-325 MG tablet, Take 1 tablet by mouth every 4 (four) hours as needed for moderate pain., Disp: 30 tablet, Rfl: 0 .  hydrocortisone cream 1 %, Apply 1 application topically daily as needed for itching., Disp: , Rfl:  .  KLOR-CON 10 10 MEQ tablet, TAKE ONE TABLET BY MOUTH ONE TIME DAILY, Disp: 90 tablet, Rfl: 3 .  Multiple Vitamin (MULTIVITAMIN WITH MINERALS) TABS tablet, Take 1 tablet by mouth 2 (two) times a week. , Disp: , Rfl:  .  polyethylene glycol powder (GLYCOLAX/MIRALAX) powder, Take 17 g by mouth 2 (two) times daily as needed. (Patient taking differently: Take 17 g by mouth 2 (two) times daily as needed for moderate constipation. ), Disp: 3350 g, Rfl: 11 .  STIOLTO RESPIMAT 2.5-2.5 MCG/ACT AERS, INHALE 2 PUFFS INTO THE LUNGS DAILY., Disp: 1 Inhaler, Rfl: 6 .  tamsulosin (FLOMAX) 0.4 MG CAPS capsule, Take 0.8 mg by mouth once daily, Disp: ,  Rfl: 11 .  theophylline (THEODUR) 200 MG 12 hr tablet, Take 1 tablet (200 mg total) by mouth 2 (two) times daily. With meals, Disp: 60 tablet, Rfl: 12 .  triamcinolone cream (KENALOG) 0.1 %, Apply 1 application topically 2 (two) times daily., Disp: 30 g, Rfl: 0 .  naproxen (NAPROSYN) 500 MG tablet, TAKE 1 TABLET BY MOUTH TWICE A DAY WITH FOOD, Disp: 180 tablet, Rfl: 1  Allergies  Allergen Reactions  . Antihistamines, Diphenhydramine-Type Other (See Comments)    Enlarged prostate    Social History   Occupational History  . 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 Smoking status: Former Smoker    Packs/day: 1.50    Years: 40.00    Pack years: 60.00    Types: Cigarettes  . Smokeless tobacco: Never Used  . Tobacco comment: quit smoking about 3yrs ago  Substance and Sexual Activity  . Alcohol use: Yes    Comment: 3-5 glasses of wine  daily  . Drug use: No  . Sexual activity: Not Currently    Family History  Problem Relation Age of Onset  . Heart failure Mother   . Heart disease Mother   . Aneurysm Father     Immunization History  Administered Date(s) Administered  . Influenza Split 04/28/2011, 05/17/2012  . Influenza Whole 05/29/2007, 05/13/2008, 06/01/2009, 06/07/2010  . Influenza,inj,Quad PF,6+ Mos 04/11/2013, 05/01/2014, 05/14/2015, 05/26/2016, 04/26/2017  . Pneumococcal Conjugate-13 02/17/2015  . Pneumococcal Polysaccharide-23 11/13/1999, 08/14/2010  . Td 02/11/2006  . Tdap 04/28/2011  . Zoster 09/20/2006     Review of systems: Positive Findings in bold print.  Constitutional:  chills, fatigue, fever, sweats, weight change Communication: Optometrist, sign Ecologist, hand writing, iPad/Android device Eyes: diplopia, glare,  light sensitivity, eyeglasses, blindness Ears nose mouth throat: Hard of hearing, deaf, sign language,  vertigo,   bloody nose,  rhinitis,  cold sores,  snoring Cardiovascular: HTN, edema, arrhythmia, pacemaker in place, defibrillator in place,  chest pain/tightness, chronic anticoagulation, blood clot Respiratory:  difficulty breathing, denies congestion, SOB, wheezing, cough Gastrointestinal: abdominal pain, diarrhea, nausea, vomiting,  Genitourinary:  nocturia,  pain on urination,  blood in urine, Foley catheter, urinary urgency Musculoskeletal: Uses mobility aid,  cramping, stiff joints, painful joints,  Skin: +changes in toenails, color change dryness, itchy skin, mole changes, or rash  Neurological: numbness, paresthesias, burning in feet, denies fainting,  seizure, change in speech. denies headaches, memory problems/poor historian, cerebral palsy Endocrine: diabetes, hypothyroidism, hyperthyroidism,  dry mouth, flushing, denies heat intolerance,  cold intolerance,  excessive thirst, denies polyuria,  nocturia Hematological:  easy bleeding,  excessive bleeding, easy bruising, enlarged lymph nodes, on long term blood thinner Allergy/immunological:  hive, frequent infections, multiple drug allergies, seasonal allergies,  Psychiatric:  anxiety, depression, mood disorder, suicidal ideations, hallucinations   Objective: Vitals:   07/08/18 1618  BP: 114/64   Vascular Examination: Capillary refill time <3 seconds x 10 digits Dorsalis pedis 2/4 b/l  Posterior tibial pulses diminished b/l No digital hair x 10 digits Skin temperature warm to warm b/l  Dermatological Examination: Skin with normal turgor, texture and tone  Toenails 1-5 b/l elongated, thick, dystrophic with subungual debris  Hyperkeratotic lesion submet heads 1, 5 b/l with tenderness to palpation   Musculoskeletal: Muscle strength 5/5 to all LE muscle groups  Pain noted plantar aspect 1st metatarsal at tibial sesamoid   Cavus foot-type b/l  Neurological: Sensation intact with 10 gram monofilament. Vibratory sensation intact.  Xrays b/l: Xrays show no evidence of  fracture  Plantarflexed 1st ray left foot  Assessment: 1. Onychomycosis 1-5 b/l  2.  Painful Calluses b/l feet submet head 1, 5 b/l 3.  Claudication b/l   Plan: 1. Toenails 1-5 debrided in length and girth without complication. 2. Callosities pared b/l submet heads 1, 5 b/l 3. Felt aperture pads placed in insoles of both shoes to offload lesions. 4. Patient to continue soft, supportive shoe gear 5. Patient to report any pedal injuries to medical professional immediately. 6. Follow up 3 months. Patient/POA to call should there be a concern in the interim.

## 2018-08-05 ENCOUNTER — Ambulatory Visit: Payer: Medicare Other | Admitting: Internal Medicine

## 2018-08-05 ENCOUNTER — Encounter: Payer: Self-pay | Admitting: Internal Medicine

## 2018-08-05 DIAGNOSIS — J432 Centrilobular emphysema: Secondary | ICD-10-CM

## 2018-08-05 DIAGNOSIS — J9611 Chronic respiratory failure with hypoxia: Secondary | ICD-10-CM | POA: Diagnosis not present

## 2018-08-05 MED ORDER — PREDNISONE 10 MG PO TABS: ORAL_TABLET | ORAL | 0 refills | Status: DC

## 2018-08-05 MED ORDER — AZITHROMYCIN 250 MG PO TABS: ORAL_TABLET | ORAL | 0 refills | Status: DC

## 2018-08-05 NOTE — Patient Instructions (Addendum)
We suggested trying to avoid over-drying the lining of your nose: Try using the humidifier bottle with distilled water with your oxygen concentrator at night.  Try using either otc nasal saline spray, ad lib but especially on first rising in the morning                 And/ or otc nasal saline gel (AYR, NeilMed, or store brand) perhaps at bedtime.                These are very safe and can be used as needed if helpful.   Scripts printed  Please call if we can help

## 2018-08-05 NOTE — Progress Notes (Signed)
HPI male former smoker (60 pack years) followed for COPD/emphysema, chronic loculated right pneumothorax, complicated by BPH, PAD/ ASCVD PFT- 10/04/2012-severe obstructive airways disease with insignificant response to bronchodilator, emphysema pattern. Air-trapping with increased residual volume. Diffusion moderately reduced. FVC 3.03/78%, FEV11.11/44%, FEV1/FVC 0.37. TLC 115%, RV 137%, DLCO 52%. 6MWT- 10/04/2012-92%, 83%, 91%, 336 m. Significant desaturation with exertion Walk Test on room air 10/01/17-desaturated to 85% by LAP 3 with HR 94.  Corrected on oxygen 2 L saturation 97%. -------------------------------------------------------------------  01/31/2018- 84 year old male former smoker (60 pack year) followed for emphysema, complicated by BPH, PAD/ ASCVD O2 2L sleep/ APS -----COPD mixed type: DME: APS for O2 QHS. Pt notes SOB with exertion but subsides when resting. Stiolto Respimat, Proair hfa,  He insists he does not need portable oxygen.  Rides exercycle for 15 or 20 minutes 3 days a week.  Sleeps every night with oxygen.  Paces himself.  08/05/2018- 84 year old male former smoker (60 pack year) followed for emphysema, chronic loculated right pneumothorax, complicated by BPH, PAD/ ASCVD ----- COPD mixed type: Pt states he continues to wheezing as usual.  Pro-air HFA, Stiolto Respimat 2.5, theophylline 200 mg twice daily, He gets some exercise regularly and feels very stable.  Aware of dyspnea with sustained exertion but no exacerbations and little routine cough or wheeze. CXR 01/31/2018- IMPRESSION: Chronic changes of COPD and long-standing loculated pneumothorax on the right. No acute abnormality is noted.  ROS-see HPI + = positive Constitutional:   No-   weight loss, night sweats, fevers, chills, fatigue, lassitude. HEENT:   No-  headaches, difficulty swallowing, tooth/dental problems, sore throat,       No-  sneezing, itching, ear ache, nasal congestion, post nasal drip,  CV:   No-   chest pain, orthopnea, PND, swelling in lower extremities, anasarca,  +dizziness, palpitations Resp: +  shortness of breath with exertion or at rest.             No- productive cough,  No non-productive cough,  No- coughing up of blood.              No-   change in color of mucus.  No- wheezing.   Skin: No-   rash or lesions. GI:  No-   heartburn, indigestion, abdominal pain, nausea, vomiting, GU: MS:  No-   joint pain or swelling. . Neuro-     nothing unusual Psych:  No- change in mood or affect. No depression or anxiety.  No memory loss.  OBJ- Physical Exam    General- Alert, Oriented, Affect-appropriate, Distress- none acute, trim Skin- rash-none, lesions- none, excoriation- none, pale Lymphadenopathy- none Head- atraumatic            Eyes- Gross vision intact, PERRLA, conjunctivae and secretions clear            Ears- Hearing, canals-normal            Nose- Clear, no-Septal dev, mucus, polyps, erosion, perforation             Throat- Mallampati III , mucosa clear , drainage- none, tonsils- atrophic. No thrush Neck- flexible , trachea midline, no stridor , thyroid nl, carotid no bruit Chest - symmetrical excursion , unlabored           Heart/CV- RRR , no murmur , no gallop  , no rub, nl s1 s2                           -  JVD- none , edema- none, stasis changes- none, varices- none           Lung- +distant but clear to P&A, wheeze- none, cough-none , dullness-none, rub- none.            Chest wall-  Abd-  Br/ Gen/ Rectal- Not done, not indicated Extrem- cyanosis- none, clubbing, none, atrophy- none, strength- nl Neuro- grossly intact to observation

## 2018-08-14 NOTE — Assessment & Plan Note (Signed)
Primarily emphysema with little bronchitic pattern.  He is at baseline now.  I am pleased that he is able to exercise regularly.  Some exercise limitation likely reflects his known peripheral arterial disease.

## 2018-08-14 NOTE — Assessment & Plan Note (Signed)
Depends on oxygen for sleep at night with no changes.

## 2018-08-21 ENCOUNTER — Ambulatory Visit: Payer: Medicare Other

## 2018-08-21 DIAGNOSIS — B351 Tinea unguium: Secondary | ICD-10-CM

## 2018-09-02 NOTE — Progress Notes (Signed)
Pt presents with mycotic infection of nails 1-5 bilateral.  All other systems are negative  Laser therapy administered to affected nails and tolerated well. All safety precautions were in place.  3rd treatment.  Follow up in 4 weeks     

## 2018-09-18 ENCOUNTER — Ambulatory Visit: Payer: Self-pay

## 2018-09-18 DIAGNOSIS — B351 Tinea unguium: Secondary | ICD-10-CM

## 2018-09-18 NOTE — Progress Notes (Signed)
Pt presents with mycotic infection of nails 1-5 bilateral  All other systems are negative  Laser therapy administered to affected nails and tolerated well. All safety precautions were in place. 4th treatment.  Follow up in 4 weeks     

## 2018-10-09 ENCOUNTER — Ambulatory Visit: Payer: Medicare Other | Admitting: Podiatry

## 2018-10-09 DIAGNOSIS — M79675 Pain in left toe(s): Secondary | ICD-10-CM

## 2018-10-09 DIAGNOSIS — L84 Corns and callosities: Secondary | ICD-10-CM

## 2018-10-09 DIAGNOSIS — M79674 Pain in right toe(s): Secondary | ICD-10-CM

## 2018-10-09 DIAGNOSIS — B351 Tinea unguium: Secondary | ICD-10-CM

## 2018-10-09 DIAGNOSIS — I739 Peripheral vascular disease, unspecified: Secondary | ICD-10-CM | POA: Diagnosis not present

## 2018-10-09 NOTE — Patient Instructions (Signed)

## 2018-10-11 ENCOUNTER — Telehealth: Payer: Self-pay | Admitting: Internal Medicine

## 2018-10-11 MED ORDER — TIOTROPIUM BROMIDE-OLODATEROL 2.5-2.5 MCG/ACT IN AERS: 2.0000 | INHALATION_SPRAY | Freq: Every day | RESPIRATORY_TRACT | 4 refills | Status: DC

## 2018-10-11 NOTE — Telephone Encounter (Signed)
Spoke with pt.  Pt was calling old number.  Gave pt new cards with correct number on it.  Script sent to preferred pharmacy.  Nothing further needed.

## 2018-10-12 ENCOUNTER — Telehealth: Payer: Self-pay | Admitting: Critical Care Medicine

## 2018-10-12 NOTE — Telephone Encounter (Signed)
Call from patient regarding use of his medications.  He c/o of some increased SOB and wanted to know if he could use the Stiolto Respimat more frequently.  He was able to talk in complete sentences and had not wheezing.  He denies productive cough of fevers or any other s/s of infection.  He is alert and pleasant on the phone.  I suggested that rather than increasing the use of that medication that he use the PRN albuterol instead as prescribed.  He was agreeable to this.  I stressed that if his symptoms worsened he needed to either come to the ED or call the clinic for a f/u appointment.

## 2018-10-15 ENCOUNTER — Telehealth: Payer: Self-pay | Admitting: Internal Medicine

## 2018-10-15 DIAGNOSIS — J449 Chronic obstructive pulmonary disease, unspecified: Secondary | ICD-10-CM

## 2018-10-15 NOTE — Telephone Encounter (Signed)
Called patients wife, unable to reach left message to give us a call back.  

## 2018-10-16 ENCOUNTER — Other Ambulatory Visit: Payer: Medicare Other

## 2018-10-16 NOTE — Telephone Encounter (Signed)
Spoke with the pt and notified of recs per CDY  He verbalized understanding  Rehab referral was placed

## 2018-10-16 NOTE — Telephone Encounter (Signed)
Spoke with the pt  He is asking about what type of exercises he can do  I talked with him about pulm rehab and he is interested in this  OK to order? Also spouse asking if CDY thinks it would be helpful to use o2 during the day while sitting at his desk  Currently he only uses with sleep  Please advise thanks

## 2018-10-16 NOTE — Telephone Encounter (Signed)
Ok to refer to pulmonary rehab for GOLD III COPD mixed type  Does he have an oximeter at home?  He doesn't need O2 while sitting as long as his O2 saturation stays at 90% or better, most of the time with that activity.

## 2018-10-17 ENCOUNTER — Telehealth (HOSPITAL_COMMUNITY): Payer: Self-pay

## 2018-10-17 NOTE — Telephone Encounter (Signed)
Referral received from MD [place] for Pulmonary Rehab with diagnosis of Stage 3 COPD. Clinical review of pt follow up appt on 12/23 Pulmonary office note. Pt appropriate for scheduling for Pulmonary rehab.  Will forward to support staff for scheduling and verification of insurance eligibility/benefits with pt consent.   Joycelyn Man, RN, BSN Cardiac and Pulmonary Rehab Nurse

## 2018-10-19 ENCOUNTER — Encounter: Payer: Self-pay | Admitting: Podiatry

## 2018-10-19 NOTE — Progress Notes (Signed)
Subjective: Jeffrey Meadows presents today for follow up onychomycosis. He has had 4 sessions of laser therapy. He notices improvement in appearance of toenails. Elongated mycotic nails are aggravated when wearing enclosed shoe gear.  [name], MD is his PCP.   He has h/o left distal SFA to popliteal artery bypass in 2006.   Current Outpatient Medications:  .  albuterol (PROAIR HFA) 108 (90 Base) MCG/ACT inhaler, Inhale 1-2 puffs into the lungs every 4 (four) hours as needed. as needed shortness of breath prior to exercise (Patient taking differently: Inhale 2 puffs into the lungs every 4 (four) hours as needed. as needed shortness of breath prior to exercise), Disp: 1 Inhaler, Rfl: 11 .  AMBULATORY NON FORMULARY MEDICATION, Ketoprofen 20% cream, Disp: 1 Tube, Rfl: 2 .  amLODipine (NORVASC) 10 MG tablet, TAKE ONE TABLET BY MOUTH DAILY, Disp: 90 tablet, Rfl: 3 .  aspirin EC 81 MG tablet, Take 81 mg by mouth daily., Disp: , Rfl:  .  atorvastatin (LIPITOR) 20 MG tablet, TAKE 1 TABLET BY MOUTH DAILY AT 6PM, Disp: 90 tablet, Rfl: 3 .  azithromycin (ZITHROMAX) 250 MG tablet, 2 today then one daily, Disp: 6 tablet, Rfl: 0 .  calcipotriene (DOVONOX) 0.005 % ointment, Apply 1 application topically daily. Applies to affected area., Disp: , Rfl: 3 .  Cholecalciferol (VITAMIN D) 2000 UNITS CAPS, Take 2,000 Units by mouth 2 (two) times a week. , Disp: , Rfl:  .  diphenoxylate-atropine (LOMOTIL) 2.5-0.025 MG tablet, Take 1 tablet by mouth every 12 (twelve) hours as needed for diarrhea or loose stools., Disp: 30 tablet, Rfl: 0 .  finasteride (PROSCAR) 5 MG tablet, Take 5 mg by mouth daily., Disp: , Rfl: 3 .  hydrochlorothiazide (HYDRODIURIL) 25 MG tablet, TAKE ONE TABLET BY MOUTH EVERY MORNING, Disp: 90 tablet, Rfl: 3 .  HYDROcodone-acetaminophen (NORCO) 5-325 MG tablet, Take 1 tablet by mouth every 4 (four) hours as needed for moderate pain., Disp: 30 tablet, Rfl: 0 .  hydrocortisone cream 1 %, Apply 1  application topically daily as needed for itching., Disp: , Rfl:  .  KLOR-CON 10 10 MEQ tablet, TAKE ONE TABLET BY MOUTH ONE TIME DAILY, Disp: 90 tablet, Rfl: 3 .  Multiple Vitamin (MULTIVITAMIN WITH MINERALS) TABS tablet, Take 1 tablet by mouth 2 (two) times a week. , Disp: , Rfl:  .  naproxen (NAPROSYN) 500 MG tablet, TAKE 1 TABLET BY MOUTH TWICE A DAY WITH FOOD, Disp: 180 tablet, Rfl: 1 .  polyethylene glycol powder (GLYCOLAX/MIRALAX) powder, Take 17 g by mouth 2 (two) times daily as needed. (Patient taking differently: Take 17 g by mouth 2 (two) times daily as needed for moderate constipation. ), Disp: 3350 g, Rfl: 11 .  predniSONE (DELTASONE) 10 MG tablet, 4 X 2 DAYS, 3 X 2 DAYS, 2 X 2 DAYS, 1 X 2 DAYS, Disp: 20 tablet, Rfl: 0 .  tamsulosin (FLOMAX) 0.4 MG CAPS capsule, Take 0.8 mg by mouth once daily, Disp: , Rfl: 11 .  theophylline (THEODUR) 200 MG 12 hr tablet, Take 1 tablet (200 mg total) by mouth 2 (two) times daily. With meals, Disp: 60 tablet, Rfl: 12 .  Tiotropium Bromide-Olodaterol (STIOLTO RESPIMAT) 2.5-2.5 MCG/ACT AERS, Inhale 2 puffs into the lungs daily., Disp: 1 Inhaler, Rfl: 4 .  triamcinolone cream (KENALOG) 0.1 %, Apply 1 application topically 2 (two) times daily., Disp: 30 g, Rfl: 0  Allergies  Allergen Reactions  . Antihistamines, Diphenhydramine-Type Other (See Comments)    Enlarged prostate  Objective:  Vascular Examination: Capillary refill time <3 seconds  x 10 digits.  Dorsalis pedis pulses 0/4 left, 2/4 right.   Posterior tibial pulses 1/4 left, 0/4 right  Digital hair absent  x 10 digits.  Skin temperature gradient WNL b/l.  Dermatological Examination: Skin with normal turgor, texture and tone b/l  There is improvement in proximal 1/2 of nail plates b/l 1-5 with distal aspect of nails discolored, thick, dystrophic with subungual debris and pain with palpation to nailbeds due to thickness of nails.  Hyperkeratotic lesions submet heads 1, 5  b/l  Musculoskeletal: Muscle strength 5/5 to all LE muscle groups  Cavus foot type b/l.  Plantarflexed first ray b/l.   Neurological: Sensation intact with 10 gram monofilament.  Vibratory sensation intact.  Assessment: Painful onychomycosis toenails 1-5 b/l  Calluses submet head 1, 5 b/l  PAD  Plan: 1. Toenails 1-5 b/l were debrided in length and girth without iatrogenic bleeding. He will continue laser treatment for onychomycosis. He has significant improvement in appearance of his toenails. 2. Calluses pared submet head 1, 5 b/l with sterile scalpel blade without incident. 3. Patient to continue soft, supportive shoe gear. 4. Patient to report any pedal injuries to medical professional immediately. 5. Follow up 3 months.  6. Patient/POA to call should there be a concern in the interim.

## 2018-10-22 ENCOUNTER — Telehealth (HOSPITAL_COMMUNITY): Payer: Self-pay

## 2018-10-22 NOTE — Telephone Encounter (Signed)
Pt insurance is active and benefits verified through [place] $20.00, DED 0/0 met, out of pocket $3,300/$30.24 met, co-insurance 0. no pre-authorization required. , REF# [id number]  Will contact patient to see if he is interested in the pulmonary [place]. Support Rep II

## 2018-10-22 NOTE — Telephone Encounter (Signed)
Attempted to call patient in regards to Pulmonary Rehab - LM on VM °Gloria W. Support Rep II °

## 2018-10-23 ENCOUNTER — Ambulatory Visit: Payer: Self-pay

## 2018-10-23 ENCOUNTER — Other Ambulatory Visit: Payer: Self-pay

## 2018-10-23 DIAGNOSIS — M79674 Pain in right toe(s): Principal | ICD-10-CM

## 2018-10-23 DIAGNOSIS — B351 Tinea unguium: Secondary | ICD-10-CM

## 2018-10-23 DIAGNOSIS — M79675 Pain in left toe(s): Secondary | ICD-10-CM

## 2018-10-30 NOTE — Progress Notes (Signed)
Pt presents with mycotic infection of nails 1-5 bilateral All other systems are negative  Laser therapy administered to affected nails and tolerated well. All safety precautions were in place. 5th treatment.  Follow up in 4 weeks     

## 2018-10-31 NOTE — Telephone Encounter (Signed)
Called and spoke with pt in regards to PR, pt stated he does not wish to participate at this time and disconnect the call.  Closed referral

## 2018-11-14 ENCOUNTER — Telehealth: Payer: Self-pay

## 2018-11-14 NOTE — Telephone Encounter (Signed)
Advise cancelling for now.

## 2018-11-14 NOTE — Telephone Encounter (Signed)
Patient called he is scheduled for a laser treatment tomorrow for his toe nails at [facility] and he wants to know your opinion whether this is a wise decision to go right now with everything going on with the virus.

## 2018-11-15 ENCOUNTER — Other Ambulatory Visit: Payer: Medicare Other

## 2018-11-18 ENCOUNTER — Other Ambulatory Visit: Payer: Self-pay

## 2018-11-18 MED ORDER — HYDROCHLOROTHIAZIDE 25 MG PO TABS: 25.0000 mg | ORAL_TABLET | Freq: Every morning | ORAL | 3 refills | Status: DC

## 2018-11-20 ENCOUNTER — Other Ambulatory Visit: Payer: Medicare Other

## 2018-12-17 ENCOUNTER — Telehealth: Payer: Self-pay | Admitting: Internal Medicine

## 2018-12-17 NOTE — Telephone Encounter (Signed)
LVM to CB and schedule CPE and Labs after 05/31/2019

## 2018-12-20 ENCOUNTER — Other Ambulatory Visit: Payer: Self-pay | Admitting: Internal Medicine

## 2018-12-20 NOTE — Telephone Encounter (Signed)
Pt CB and schedule CPE and labs

## 2019-01-08 ENCOUNTER — Ambulatory Visit: Payer: Medicare Other | Admitting: Podiatry

## 2019-01-08 ENCOUNTER — Other Ambulatory Visit: Payer: Self-pay

## 2019-01-08 ENCOUNTER — Encounter: Payer: Self-pay | Admitting: Podiatry

## 2019-01-08 DIAGNOSIS — M79674 Pain in right toe(s): Secondary | ICD-10-CM | POA: Diagnosis not present

## 2019-01-08 DIAGNOSIS — L84 Corns and callosities: Secondary | ICD-10-CM

## 2019-01-08 DIAGNOSIS — I739 Peripheral vascular disease, unspecified: Secondary | ICD-10-CM

## 2019-01-08 DIAGNOSIS — M79673 Pain in unspecified foot: Secondary | ICD-10-CM | POA: Diagnosis not present

## 2019-01-08 DIAGNOSIS — B351 Tinea unguium: Secondary | ICD-10-CM

## 2019-01-08 DIAGNOSIS — M79675 Pain in left toe(s): Secondary | ICD-10-CM | POA: Diagnosis not present

## 2019-01-08 NOTE — Patient Instructions (Addendum)

## 2019-01-12 NOTE — Progress Notes (Signed)
Subjective: Jeffrey Meadows presents today with painful, thick toenails 1-5 b/l that he cannot cut and which interfere with daily activities.  Pain is aggravated when wearing enclosed shoe gear.  He relates that he is starting to have some arch aching in both arches at night.  He states that it is aching in nature and he rubs it at night.  He has completed his laser therapy for onychomycosis of toenails.  [name], MD is his PCP.  Last visit Dec 17, 2018.  Current Outpatient Medications:  .  albuterol (PROAIR HFA) 108 (90 Base) MCG/ACT inhaler, Inhale 1-2 puffs into the lungs every 4 (four) hours as needed. as needed shortness of breath prior to exercise (Patient taking differently: Inhale 2 puffs into the lungs every 4 (four) hours as needed. as needed shortness of breath prior to exercise), Disp: 1 Inhaler, Rfl: 11 .  AMBULATORY NON FORMULARY MEDICATION, Ketoprofen 20% cream, Disp: 1 Tube, Rfl: 2 .  amLODipine (NORVASC) 10 MG tablet, TAKE ONE TABLET BY MOUTH DAILY, Disp: 90 tablet, Rfl: 3 .  aspirin EC 81 MG tablet, Take 81 mg by mouth daily., Disp: , Rfl:  .  atorvastatin (LIPITOR) 20 MG tablet, TAKE 1 TABLET BY MOUTH DAILY AT 6PM, Disp: 90 tablet, Rfl: 3 .  azithromycin (ZITHROMAX) 250 MG tablet, 2 today then one daily, Disp: 6 tablet, Rfl: 0 .  calcipotriene (DOVONOX) 0.005 % ointment, Apply 1 application topically daily. Applies to affected area., Disp: , Rfl: 3 .  Cholecalciferol (VITAMIN D) 2000 UNITS CAPS, Take 2,000 Units by mouth 2 (two) times a week. , Disp: , Rfl:  .  diphenoxylate-atropine (LOMOTIL) 2.5-0.025 MG tablet, Take 1 tablet by mouth every 12 (twelve) hours as needed for diarrhea or loose stools., Disp: 30 tablet, Rfl: 0 .  finasteride (PROSCAR) 5 MG tablet, Take 5 mg by mouth daily., Disp: , Rfl: 3 .  hydrochlorothiazide (HYDRODIURIL) 25 MG tablet, Take 1 tablet (25 mg total) by mouth every morning., Disp: 90 tablet, Rfl: 3 .  HYDROcodone-acetaminophen (NORCO) 5-325  MG tablet, Take 1 tablet by mouth every 4 (four) hours as needed for moderate pain., Disp: 30 tablet, Rfl: 0 .  hydrocortisone cream 1 %, Apply 1 application topically daily as needed for itching., Disp: , Rfl:  .  KLOR-CON 10 10 MEQ tablet, TAKE ONE TABLET BY MOUTH ONE TIME DAILY, Disp: 90 tablet, Rfl: 3 .  Multiple Vitamin (MULTIVITAMIN WITH MINERALS) TABS tablet, Take 1 tablet by mouth 2 (two) times a week. , Disp: , Rfl:  .  naproxen (NAPROSYN) 500 MG tablet, TAKE 1 TABLET BY MOUTH TWICE A DAY WITH FOOD, Disp: 180 tablet, Rfl: 1 .  polyethylene glycol powder (GLYCOLAX/MIRALAX) powder, Take 17 g by mouth 2 (two) times daily as needed. (Patient taking differently: Take 17 g by mouth 2 (two) times daily as needed for moderate constipation. ), Disp: 3350 g, Rfl: 11 .  predniSONE (DELTASONE) 10 MG tablet, 4 X 2 DAYS, 3 X 2 DAYS, 2 X 2 DAYS, 1 X 2 DAYS, Disp: 20 tablet, Rfl: 0 .  tamsulosin (FLOMAX) 0.4 MG CAPS capsule, Take 0.8 mg by mouth once daily, Disp: , Rfl: 11 .  theophylline (THEODUR) 200 MG 12 hr tablet, Take 1 tablet (200 mg total) by mouth 2 (two) times daily. With meals, Disp: 60 tablet, Rfl: 12 .  Tiotropium Bromide-Olodaterol (STIOLTO RESPIMAT) 2.5-2.5 MCG/ACT AERS, Inhale 2 puffs into the lungs daily., Disp: 1 Inhaler, Rfl: 4 .  triamcinolone cream (  KENALOG) 0.1 %, Apply 1 application topically 2 (two) times daily., Disp: 30 g, Rfl: 0  Allergies  Allergen Reactions  . Antihistamines, Diphenhydramine-Type Other (See Comments)    Enlarged prostate    Objective: There were no vitals filed for this visit.  Vascular Examination: Capillary refill time less than 3 seconds x 10 digits.  Dorsalis pedis pulses 0/4 left, 2/4 right.    Posterior tibial pulses 1/4 left, 0/4 right.  Digital hair absent x 10 digits  Skin temperature gradient WNL b/l  Dermatological Examination: Skin with normal turgor, texture and tone b/l.  There is improvement in the proximal two thirds of  toenails 1-5 b/l.  Distal one third of toenails 1 through 5 remain discolored, thick, dystrophic with subungual debris and pain with palpation to nailbeds due to thickness of nails.  I believe this is just an issue of his nails needing to grow out.  He has done well with laser therapy.  Hyperkeratotic lesions submetatarsal heads 1, 5 bilaterally.  Musculoskeletal: Muscle strength 5/5 to all LE muscle groups.  Cavus foot type bilaterally.  Plantarflexed first ray bilaterally.  No pain, crepitus or joint limitation noted with ROM.   Neurological: Sensation intact with 10 gram monofilament.  Vibratory sensation intact.  Assessment: Painful onychomycosis toenails 1-5 b/l, improvement with laser therapy Calluses submetatarsal heads 1, 5 bilaterally Bilateral arch pain PAD  Plan: 1. Toenails 1-5 b/l were debrided in length and girth without iatrogenic bleeding. 2. Calluses submetatarsal heads 1, 5 bilaterally were pared utilizing sterile scalpel blade without incident. 3. Regarding arch pain advised patient to apply Biofreeze gel or Aspercreme to both areas before bedtime.  If he continues to have this problem, I will have him see Mr. Velora Heckler for evaluation of custom orthotics due to his high arches. 4. Patient to continue soft, supportive shoe gear daily. 5. Patient to report any pedal injuries to medical professional immediately. 6. Follow up 3 months.  7. Patient/POA to call should there be a concern in the interim.

## 2019-01-23 ENCOUNTER — Telehealth: Payer: Self-pay | Admitting: Internal Medicine

## 2019-01-23 NOTE — Telephone Encounter (Signed)
I would prefer that he keep taking Naprosyn 500 mg twice a day. Aleve dose is different.

## 2019-01-23 NOTE — Telephone Encounter (Signed)
LVM to CB.

## 2019-01-23 NOTE — Telephone Encounter (Signed)
[name] [phone]  [name] called to ask if Jeffrey Meadows should continue taking Naproxen or would Aleve over the accouter be just as good. Or should we do OV or virtual visit to discuss.

## 2019-02-05 ENCOUNTER — Encounter: Payer: Self-pay | Admitting: Internal Medicine

## 2019-02-05 ENCOUNTER — Other Ambulatory Visit: Payer: Self-pay

## 2019-02-05 ENCOUNTER — Ambulatory Visit (INDEPENDENT_AMBULATORY_CARE_PROVIDER_SITE_OTHER): Payer: Medicare Other | Admitting: Internal Medicine

## 2019-02-05 DIAGNOSIS — J9611 Chronic respiratory failure with hypoxia: Secondary | ICD-10-CM

## 2019-02-05 DIAGNOSIS — J438 Other emphysema: Secondary | ICD-10-CM

## 2019-02-05 MED ORDER — STIOLTO RESPIMAT 2.5-2.5 MCG/ACT IN AERS: 2.0000 | INHALATION_SPRAY | Freq: Every day | RESPIRATORY_TRACT | 3 refills | Status: DC

## 2019-02-05 MED ORDER — ALBUTEROL SULFATE HFA 108 (90 BASE) MCG/ACT IN AERS: 1.0000 | INHALATION_SPRAY | RESPIRATORY_TRACT | 3 refills | Status: DC | PRN

## 2019-02-05 NOTE — Patient Instructions (Addendum)
Glad to hear things have been pretty stable for you. Please let us know if we can help.  Refill scripts are sent to [facility] for 3 months at a time

## 2019-02-05 NOTE — Progress Notes (Signed)
HPI male former smoker (60 pack years) followed for COPD/emphysema, chronic loculated right pneumothorax, complicated by BPH, PAD/ ASCVD PFT- 10/04/2012-severe obstructive airways disease with insignificant response to bronchodilator, emphysema pattern. Air-trapping with increased residual volume. Diffusion moderately reduced. FVC 3.03/78%, FEV11.11/44%, FEV1/FVC 0.37. TLC 115%, RV 137%, DLCO 52%. 6MWT- 10/04/2012-92%, 83%, 91%, 336 m. Significant desaturation with exertion Walk Test on room air 10/01/17-desaturated to 85% by LAP 3 with HR 94.  Corrected on oxygen 2 L saturation 97%. -------------------------------------------------------------------  08/05/2018- 85 year old male former smoker (60 pack year) followed for emphysema, chronic loculated right pneumothorax, complicated by BPH, PAD/ ASCVD ----- COPD mixed type: Pt states he continues to wheezing as usual.  Pro-air HFA, Stiolto Respimat 2.5, theophylline 200 mg twice daily, He gets some exercise regularly and feels very stable.  Aware of dyspnea with sustained exertion but no exacerbations and little routine cough or wheeze. CXR 01/31/2018- IMPRESSION: Chronic changes of COPD and long-standing loculated pneumothorax on the right. No acute abnormality is noted.   ROS-see HPI + = positive Constitutional:   No-   weight loss, night sweats, fevers, chills, fatigue, lassitude. HEENT:   No-  headaches, difficulty swallowing, tooth/dental problems, sore throat,       No-  sneezing, itching, ear ache, nasal congestion, post nasal drip,  CV:  No-   chest pain, orthopnea, PND, swelling in lower extremities, anasarca,  +dizziness, palpitations Resp: +  shortness of breath with exertion or at rest.             No- productive cough,  No non-productive cough,  No- coughing up of blood.              No-   change in color of mucus.  No- wheezing.   Skin: No-   rash or lesions. GI:  No-   heartburn, indigestion, abdominal pain, nausea,  vomiting, GU: MS:  No-   joint pain or swelling. . Neuro-     nothing unusual Psych:  No- change in mood or affect. No depression or anxiety.  No memory loss.  OBJ- Physical Exam    General- Alert, Oriented, Affect-appropriate, Distress- none acute, trim Skin- rash-none, lesions- none, excoriation- none, pale Lymphadenopathy- none Head- atraumatic            Eyes- Gross vision intact, PERRLA, conjunctivae and secretions clear            Ears- Hearing, canals-normal            Nose- Clear, no-Septal dev, mucus, polyps, erosion, perforation             Throat- Mallampati III , mucosa clear , drainage- none, tonsils- atrophic. No thrush Neck- flexible , trachea midline, no stridor , thyroid nl, carotid no bruit Chest - symmetrical excursion , unlabored           Heart/CV- RRR , no murmur , no gallop  , no rub, nl s1 s2                           - JVD- none , edema- none, stasis changes- none, varices- none           Lung- +distant but clear to P&A, wheeze- none, cough-none , dullness-none, rub- none.            Chest wall-  Abd-  Br/ Gen/ Rectal- Not done, not indicated Extrem- cyanosis- none, clubbing, none, atrophy- none, strength- nl Neuro- grossly intact to observation  02/05/2019- Virtual Visit via Telephone Note  I connected with Mellody Memos on 02/05/19 at 10:00 AM EDT by telephone and verified that I am speaking with the correct person using two identifiers.  Location: Patient: H Provider:O   I discussed the limitations, risks, security and privacy concerns of performing an evaluation and management service by telephone and the availability of in person appointments. I also discussed with the patient that there may be a patient responsible charge related to this service. The patient expressed understanding and agreed to proceed.   History of Present Illness: 85 year old male former smoker (60 pack year) followed for emphysema, chronic loculated right pneumothorax,  complicated by BPH, PAD/ ASCVD Pro-air HFA, Stiolto Respimat 2.5, theophylline 200 mg twice daily O2 2L sleep and prn/, APS -----breathing at baseline, on O2 2L, using albuterol and Stiolto (needs refill, by mail from local pharmacy He denies changes or acute episodes.   Observations/Objective:   Assessment and Plan:   Follow Up Instructions:    I discussed the assessment and treatment plan with the patient. The patient was provided an opportunity to ask questions and all were answered. The patient agreed with the plan and demonstrated an understanding of the instructions.   The patient was advised to call back or seek an in-person evaluation if the symptoms worsen or if the condition fails to improve as anticipated.  I provided 17 minutes of non-face-to-face time during this encounter.   Baird Lyons, MD

## 2019-02-08 ENCOUNTER — Encounter: Payer: Self-pay | Admitting: Internal Medicine

## 2019-02-08 NOTE — Assessment & Plan Note (Signed)
We don't have his wife's input on this televisit, but he indicates he is quite stable Plan- refill albuterol hfa and Stiolto

## 2019-02-08 NOTE — Assessment & Plan Note (Signed)
He continues to benefit from O2 during sleep

## 2019-03-19 ENCOUNTER — Encounter (HOSPITAL_COMMUNITY): Payer: Self-pay | Admitting: Obstetrics and Gynecology

## 2019-03-19 ENCOUNTER — Other Ambulatory Visit: Payer: Self-pay

## 2019-03-19 ENCOUNTER — Emergency Department (HOSPITAL_COMMUNITY): Payer: Medicare Other

## 2019-03-19 ENCOUNTER — Emergency Department (HOSPITAL_COMMUNITY)
Admission: EM | Admit: 2019-03-19 | Discharge: 2019-03-19 | Disposition: A | Discharge status: Home / Self Care | Payer: Medicare Other | Attending: Emergency Medicine | Admitting: Emergency Medicine

## 2019-03-19 DIAGNOSIS — Z20822 Contact with and (suspected) exposure to covid-19: Secondary | ICD-10-CM

## 2019-03-19 DIAGNOSIS — Z20828 Contact with and (suspected) exposure to other viral communicable diseases: Secondary | ICD-10-CM | POA: Diagnosis not present

## 2019-03-19 DIAGNOSIS — B349 Viral infection, unspecified: Secondary | ICD-10-CM | POA: Insufficient documentation

## 2019-03-19 DIAGNOSIS — J449 Chronic obstructive pulmonary disease, unspecified: Secondary | ICD-10-CM

## 2019-03-19 DIAGNOSIS — I10 Essential (primary) hypertension: Secondary | ICD-10-CM | POA: Diagnosis not present

## 2019-03-19 DIAGNOSIS — Z96641 Presence of right artificial hip joint: Secondary | ICD-10-CM | POA: Insufficient documentation

## 2019-03-19 DIAGNOSIS — Z87891 Personal history of nicotine dependence: Secondary | ICD-10-CM | POA: Diagnosis not present

## 2019-03-19 DIAGNOSIS — Z79899 Other long term (current) drug therapy: Secondary | ICD-10-CM | POA: Diagnosis not present

## 2019-03-19 DIAGNOSIS — R5383 Other fatigue: Secondary | ICD-10-CM | POA: Diagnosis present

## 2019-03-19 LAB — COMPREHENSIVE METABOLIC PANEL
ALT: 18 U/L (ref 0–44)
AST: 21 U/L (ref 15–41)
Albumin: 3.8 g/dL (ref 3.5–5.0)
Alkaline Phosphatase: 32 U/L — ABNORMAL LOW (ref 38–126)
Anion gap: 10 (ref 5–15)
BUN: 17 mg/dL (ref 8–23)
CO2: 27 mmol/L (ref 22–32)
Calcium: 9.4 mg/dL (ref 8.9–10.3)
Chloride: 99 mmol/L (ref 98–111)
Creatinine, Ser: 0.88 mg/dL (ref 0.61–1.24)
GFR calc Af Amer: >60 mL/min (ref >60)
GFR calc non Af Amer: >60 mL/min (ref >60)
Glucose, Bld: 114 mg/dL — ABNORMAL HIGH (ref 70–99)
Potassium: 3.5 mmol/L (ref 3.5–5.1)
Sodium: 136 mmol/L (ref 135–145)
Total Bilirubin: 1.1 mg/dL (ref 0.3–1.2)
Total Protein: 7.3 g/dL (ref 6.5–8.1)

## 2019-03-19 LAB — URINALYSIS, ROUTINE W REFLEX MICROSCOPIC
Bilirubin Urine: NEGATIVE
Glucose, UA: NEGATIVE mg/dL
Hgb urine dipstick: NEGATIVE
Ketones, ur: 5 mg/dL — AB
Leukocytes,Ua: NEGATIVE
Nitrite: NEGATIVE
Protein, ur: 100 mg/dL — AB
Specific Gravity, Urine: 1.025 (ref 1.005–1.030)
pH: 6 (ref 5.0–8.0)

## 2019-03-19 LAB — CBC WITH DIFFERENTIAL/PLATELET
Abs Immature Granulocytes: 0.05 10*3/uL (ref 0.00–0.07)
Basophils Absolute: 0.1 10*3/uL (ref 0.0–0.1)
Basophils Relative: 1 %
Eosinophils Absolute: 0 10*3/uL (ref 0.0–0.5)
Eosinophils Relative: 0 %
HCT: 47.1 % (ref 39.0–52.0)
Hemoglobin: 16.1 g/dL (ref 13.0–17.0)
Immature Granulocytes: 0 %
Lymphocytes Relative: 9 %
Lymphs Abs: 1.1 10*3/uL (ref 0.7–4.0)
MCH: 32.5 pg (ref 26.0–34.0)
MCHC: 34.2 g/dL (ref 30.0–36.0)
MCV: 95.2 fL (ref 80.0–100.0)
Monocytes Absolute: 1.4 10*3/uL — ABNORMAL HIGH (ref 0.1–1.0)
Monocytes Relative: 11 %
Neutro Abs: 10.1 10*3/uL — ABNORMAL HIGH (ref 1.7–7.7)
Neutrophils Relative %: 79 %
Platelets: 200 10*3/uL (ref 150–400)
RBC: 4.95 MIL/uL (ref 4.22–5.81)
RDW: 12.8 % (ref 11.5–15.5)
WBC: 12.7 10*3/uL — ABNORMAL HIGH (ref 4.0–10.5)
nRBC: 0 % (ref 0.0–0.2)

## 2019-03-19 LAB — LIPASE, BLOOD: Lipase: 28 U/L (ref 11–51)

## 2019-03-19 MED ORDER — SODIUM CHLORIDE 0.9 % IV SOLN
INTRAVENOUS | Status: DC
  Administered 2019-03-19: 13:00:00 via INTRAVENOUS

## 2019-03-19 MED ORDER — SODIUM CHLORIDE 0.9 % IV BOLUS
500.0000 mL | Freq: Once | INTRAVENOUS | Status: AC
  Administered 2019-03-19: 500 mL via INTRAVENOUS

## 2019-03-19 NOTE — ED Provider Notes (Signed)
[facility] DEPT Provider Note   CSN: [id number] Arrival date & time: 03/19/19  1037     History   Chief Complaint Chief Complaint  Patient presents with  . Urinary Frequency    HPI Jeffrey Meadows is a 85 y.o. male.     Patient brought in by his wife.  Patient has a patient of Dr. Renold Genta.  According to patient's wife patient has had some joint pain muscle pain fatigue more sleepy than usual for anywhere from 6 to 4 days.  One episode of vomiting does not have very good appetite no cough but has had some sneezing.  Patient had fever to 99.5 at home no diarrhea.  Known history of COPD.  Does use oxygen at night.  Oxygen saturation on presentation was 92%.  Dr. Renold Genta also called and we had a discussion about the concerns.  Patient's past medical history is significant for the COPD and is followed by pulmonary medicine for that.  There is some concern for perhaps coronavirus infection as well.  In addition there was some concern about urinary tract infection.     Past Medical History:  Diagnosis Date  . Adenomatous polyp of colon 08/2001   removed hepatic flexure  . Arthritis   . Cataract   . Chronic back pain    stenosis/scoliosis/synovial cyst  . Enlarged prostate    takes Proscar daily  . History of blood transfusion    no abnormal reaction noted  . Hyperlipidemia    takes Atorvastatin daily  . Hypertension    takes Amlodipine and HCTZ daily  . Joint pain   . On home oxygen therapy    at night  . Raynaud disease   . Urinary frequency    takes Flomax daily  . Villous adenoma of colon 11/.2001   2 cm removed cecum    Patient Active Problem List   Diagnosis Date Noted  . Chronic respiratory failure with hypoxia ([place]) 10/02/2017  . Acute midline low back pain without sciatica 06/29/2016  . Chronic alcoholism ([place]) 06/29/2016  . Osteoarthritis of cervical spine 03/01/2016  . Strain of left trapezius muscle 10/21/2015  . Peripheral  vascular disease, unspecified ([place]) 12/02/2013  . Essential tremor 10/14/2013  . Hoarseness of voice 04/12/2013  . Nasal septal deviation 07/16/2012  . Chronic foot pain, left 05/09/2012  . Claudication in peripheral vascular disease ([place]) 11/21/2011  . Constipation 03/09/2011  . Hearing impairment 03/09/2011  . Allergic rhinitis 02/02/2011  . Hip pain, chronic 06/08/2010  . Tobacco use disorder, severe, in sustained remission 03/18/2010  . BPH with obstruction/lower urinary tract symptoms 06/17/2008  . ANOMALY, CONGENITAL HYPOSPADIAS 04/23/2007  . Hyperlipidemia 10/11/2006  . HYPERTENSION, BENIGN SYSTEMIC 10/11/2006  . HEMORRHOIDS, NOS 10/11/2006  . COPD with emphysema ([place]) 10/11/2006  . PSORIASIS 10/11/2006  . GAIT, ABNORMAL 10/11/2006    Past Surgical History:  Procedure Laterality Date  . ABDOMINAL AORTOGRAM N/A 08/15/2017   Procedure: ABDOMINAL AORTOGRAM;  Surgeon: Waynetta Sandy, MD;  Location: [facility];  Service: Cardiovascular;  Laterality: N/A;  . cataract surgery    . CYSTOSCOPY WITH LITHOLAPAXY N/A 01/11/2016   Procedure: CYSTOSCOPY WITH LITHOLAPAXY;  Surgeon: Festus Aloe, MD;  Location: [facility];  Service: Urology;  Laterality: N/A;  . GREEN LIGHT LASER TURP (TRANSURETHRAL RESECTION OF PROSTATE N/A 01/11/2016   Procedure: GREEN LIGHT LASER TURP (TRANSURETHRAL RESECTION OF PROSTATE;  Surgeon: Festus Aloe, MD;  Location: [facility];  Service: Urology;  Laterality: N/A;  .  HERNIA REPAIR Left    inguinal  . HOLMIUM LASER APPLICATION N/A 3/81/7711   Procedure: HOLMIUM LASER APPLICATION;  Surgeon: Festus Aloe, MD;  Location: [facility];  Service: Urology;  Laterality: N/A;  . JOINT REPLACEMENT     Right Hip  . LAMINECTOMY Right 07/02/2014   Procedure: Right Lumbar two-three Laminectomy for Synovial Cyst;  Surgeon: Erline Levine, MD;  Location: [facility];  Service: Neurosurgery;  Laterality: Right;  . LOWER EXTREMITY ANGIOGRAPHY Bilateral 08/15/2017    Procedure: LOWER EXTREMITY ANGIOGRAPHY;  Surgeon: Waynetta Sandy, MD;  Location: [facility];  Service: Cardiovascular;  Laterality: Bilateral;  . POPLITEAL VENOUS ANEURYSM REPAIR Left 2010   about 6 yrs ago  . PROSTATE BIOPSY  4/95  . SPINE SURGERY  Jan. 2016   Dr. Vickie Epley  . TONSILLECTOMY    . TOTAL HIP ARTHROPLASTY  1952   right        Home Medications    Prior to Admission medications   Medication Sig Start Date End Date Taking? Authorizing Provider  albuterol (PROAIR HFA) 108 (90 Base) MCG/ACT inhaler Inhale 1-2 puffs into the lungs every 4 (four) hours as needed. as needed shortness of breath prior to exercise Patient taking differently: Inhale 1-2 puffs into the lungs every 4 (four) hours as needed for wheezing or shortness of breath (shortness of breath prior to exercise).  02/05/19  Yes Young, Clinton D, MD  amLODipine (NORVASC) 10 MG tablet TAKE ONE TABLET BY MOUTH DAILY Patient taking differently: Take 10 mg by mouth daily.  12/20/18  Yes Baxley, Cresenciano Lick, MD  atorvastatin (LIPITOR) 20 MG tablet TAKE 1 TABLET BY MOUTH DAILY AT 6PM Patient taking differently: Take 20 mg by mouth daily.  06/19/18  Yes Baxley, Cresenciano Lick, MD  calcipotriene (DOVONOX) 0.005 % ointment Apply 1 application topically daily. Applies to affected area. 11/11/15  Yes [provider]  Cholecalciferol (VITAMIN D) 2000 UNITS CAPS Take 2,000 Units by mouth 2 (two) times a week.    Yes [provider]  docusate sodium (COLACE) 100 MG capsule Take 100 mg by mouth daily.   Yes [provider]  finasteride (PROSCAR) 5 MG tablet Take 5 mg by mouth daily. 07/18/18  Yes [provider]  hydrochlorothiazide (HYDRODIURIL) 25 MG tablet Take 1 tablet (25 mg total) by mouth every morning. 11/18/18  Yes Baxley, Cresenciano Lick, MD  KLOR-CON 10 10 MEQ tablet TAKE ONE TABLET BY MOUTH ONE TIME DAILY Patient taking differently: Take 10 mEq by mouth daily.  05/11/18  Yes Baxley, Cresenciano Lick, MD   naproxen (NAPROSYN) 500 MG tablet TAKE 1 TABLET BY MOUTH TWICE A DAY WITH FOOD Patient taking differently: Take 500 mg by mouth 2 (two) times daily with a meal.  07/24/18  Yes Baxley, Cresenciano Lick, MD  tamsulosin (FLOMAX) 0.4 MG CAPS capsule Take 0.8 mg by mouth daily.  12/12/15  Yes [provider]  Tiotropium Bromide-Olodaterol (STIOLTO RESPIMAT) 2.5-2.5 MCG/ACT AERS Inhale 2 puffs into the lungs daily. 02/05/19  Yes Young, Tarri Fuller D, MD  AMBULATORY NON FORMULARY MEDICATION Ketoprofen 20% cream Patient not taking: Reported on 03/19/2019 03/19/17   Stefanie Libel, MD  azithromycin [facility]) 250 MG tablet 2 today then one daily Patient not taking: Reported on 03/19/2019 08/05/18   Deneise Lever, MD  diphenoxylate-atropine (LOMOTIL) 2.5-0.025 MG tablet Take 1 tablet by mouth every 12 (twelve) hours as needed for diarrhea or loose stools. Patient not taking: Reported on 03/19/2019 03/12/18   Baxley,  Cresenciano Lick, MD  HYDROcodone-acetaminophen (NORCO) 5-325 MG tablet Take 1 tablet by mouth every 4 (four) hours as needed for moderate pain. Patient not taking: Reported on 03/19/2019 03/21/18   [name], MD  polyethylene glycol powder (GLYCOLAX/MIRALAX) powder Take 17 g by mouth 2 (two) times daily as needed. Patient not taking: Reported on 03/19/2019 07/10/16   [name], MD  predniSONE (DELTASONE) 10 MG tablet 4 X 2 DAYS, 3 X 2 DAYS, 2 X 2 DAYS, 1 X 2 DAYS Patient not taking: Reported on 03/19/2019 08/05/18   Deneise Lever, MD  theophylline (THEODUR) 200 MG 12 hr tablet Take 1 tablet (200 mg total) by mouth 2 (two) times daily. With meals Patient not taking: Reported on 03/19/2019 01/31/18   Baird Lyons D, MD  triamcinolone cream (KENALOG) 0.1 % Apply 1 application topically 2 (two) times daily. Patient not taking: Reported on 03/19/2019 03/21/18   [name], MD    Family History Family History  Problem Relation Age of Onset  . Heart failure Mother   . Heart disease Mother   . Aneurysm Father      Social History Social History   Tobacco Use  . Smoking status: Former Smoker    Packs/day: 1.50    Years: 40.00    Pack years: 60.00    Types: Cigarettes    Quit date: 02/04/1989    Years since quitting: 30.1  . Smokeless tobacco: Never Used  . Tobacco comment: quit smoking about 75yrs ago  Substance Use Topics  . Alcohol use: Yes    Comment: 3-5 glasses of wine daily, 12-20 glasses a week  . Drug use: No     Allergies   Antihistamines, diphenhydramine-type   Review of Systems Review of Systems  Constitutional: Positive for fever. Negative for chills.  HENT: Positive for congestion. Negative for rhinorrhea and sore throat.   Eyes: Negative for visual disturbance.  Respiratory: Positive for shortness of breath. Negative for cough.   Cardiovascular: Negative for chest pain and leg swelling.  Gastrointestinal: Positive for nausea and vomiting. Negative for abdominal pain and diarrhea.  Genitourinary: Negative for dysuria and hematuria.  Musculoskeletal: Positive for arthralgias and myalgias. Negative for back pain and neck pain.  Skin: Negative for rash.  Neurological: Negative for dizziness, light-headedness and headaches.  Hematological: Does not bruise/bleed easily.  Psychiatric/Behavioral: Negative for confusion.     Physical Exam Updated Vital Signs BP (!) 145/65   Pulse 76   Temp 98.8 F (37.1 C) (Oral)   Resp 18   SpO2 97%   Physical Exam Vitals signs and nursing note reviewed.  Constitutional:      Appearance: Normal appearance. He is well-developed.  HENT:     Head: Normocephalic and atraumatic.  Eyes:     Extraocular Movements: Extraocular movements intact.     Conjunctiva/sclera: Conjunctivae normal.     Pupils: Pupils are equal, round, and reactive to light.  Neck:     Musculoskeletal: Normal range of motion and neck supple.  Cardiovascular:     Rate and Rhythm: Normal rate and regular rhythm.     Heart sounds: No murmur.  Pulmonary:      Effort: Pulmonary effort is normal. No respiratory distress.     Breath sounds: Normal breath sounds.  Abdominal:     General: Bowel sounds are normal.     Palpations: Abdomen is soft.     Tenderness: There is no abdominal tenderness.  Musculoskeletal: Normal range of motion.  Skin:    General: Skin is warm and dry.  Neurological:     General: No focal deficit present.     Mental Status: He is alert. Mental status is at baseline.      ED Treatments / Results  Labs (all labs ordered are listed, but only abnormal results are displayed) Labs Reviewed  URINALYSIS, ROUTINE W REFLEX MICROSCOPIC - Abnormal; Notable for the following components:      Result Value   Ketones, ur 5 (*)    Protein, ur 100 (*)    Bacteria, UA RARE (*)    All other components within normal limits  CBC WITH DIFFERENTIAL/PLATELET - Abnormal; Notable for the following components:   WBC 12.7 (*)    Neutro Abs 10.1 (*)    Monocytes Absolute 1.4 (*)    All other components within normal limits  COMPREHENSIVE METABOLIC PANEL - Abnormal; Notable for the following components:   Glucose, Bld 114 (*)    Alkaline Phosphatase 32 (*)    All other components within normal limits  URINE CULTURE  NOVEL CORONAVIRUS, NAA (HOSPITAL ORDER, SEND-OUT TO REF LAB)  LIPASE, BLOOD    EKG EKG Interpretation  Date/Time:  Wednesday March 19 2019 12:57:51 EDT Ventricular Rate:  79 PR Interval:    QRS Duration: 96 QT Interval:  395 QTC Calculation: 453 R Axis:   12 Text Interpretation:  Sinus rhythm Borderline low voltage, extremity leads No significant change since last tracing Confirmed by Fredia Sorrow [phone]) on 03/19/2019 1:05:25 PM   Radiology Dg Chest 2 View  Result Date: 03/19/2019 CLINICAL DATA:  85 year old male with fatigue, possible UTI. EXAM: CHEST - 2 VIEW COMPARISON:  Chest radiographs 03/21/2018 and earlier. FINDINGS: Semi upright AP and lateral views. Stable tortuosity of the aorta. Calcified aortic  atherosclerosis. Other mediastinal contours are within normal limits. Chronic large lung volumes with attenuation of upper lobe bronchovascular markings suggesting emphysema and chronic increased interstitial opacity otherwise. No pneumothorax, pulmonary edema, pleural effusion or acute pulmonary opacity. Visualized tracheal air column is within normal limits. Stable visualized osseous structures. Paucity of bowel gas in the upper abdomen. IMPRESSION: 1. No acute cardiopulmonary abnormality. 2. Chronic lung disease.  Aortic Atherosclerosis (ICD10-I70.0). Electronically Signed   By: Genevie Ann M.D.   On: 03/19/2019 13:18    Procedures Procedures (including critical care time)  Medications Ordered in ED Medications  0.9 %  sodium chloride infusion ( Intravenous New Bag/Given (Non-Interop) 03/19/19 1300)  sodium chloride 0.9 % bolus 500 mL (500 mLs Intravenous New Bag/Given (Non-Interop) 03/19/19 1259)     Initial Impression / Assessment and Plan / ED Course  I have reviewed the triage vital signs and the nursing notes.  Pertinent labs & imaging results that were available during my care of the patient were reviewed by me and considered in my medical decision making (see chart for details).        Work-up without any significant findings other than a mild leukocytosis with a white count of 12,000.  Patient oxygen saturations here have been consistently in the mid 90s.  Patient in no respiratory distress.  Chest x-ray negative for pneumonia.  Complete metabolic panel without any significant abnormalities.  Urinalysis negative for urinary tract infection.  EKG without any acute changes.  No evidence of urinary tract infection no evidence of pneumonia however COVID-19 infection is still possible based on patient's symptoms.  Outpatient testing will be performed.  We will have patient follow-up with primary care doctor regarding the mild  leukocytosis.  Patient's wife called at home on her mobile phone  and this was discussed with her.  He will need to self quarantine until the COVID-19 test has returned.  They will return for any new or worse symptoms at all.  Final Clinical Impressions(s) / ED Diagnoses   Final diagnoses:  Viral illness  Chronic obstructive pulmonary disease, unspecified COPD type ([place])  Suspected Covid-19 Virus Infection    ED Discharge Orders    None       Fredia Sorrow, MD 03/19/19 1514

## 2019-03-19 NOTE — Discharge Instructions (Addendum)
Outpatient COVID testing has been performed.  You should get your results in 24 to 48 hours.  In the meantime self isolate.  Return for any new or worse symptoms.  Rest of work-up today to include chest x-ray urinalysis without any acute findings.  The white blood cell count was slightly elevated at 12,000.  This can be followed up by Dr. Renold Genta.  It is possible that your symptoms could be consistent with COVID-19 infection.  Although not classic.

## 2019-03-19 NOTE — ED Triage Notes (Signed)
Pt reports he was sent here to be evaluated for a possible UTI.  Pt reports he is also having joint pain.

## 2019-03-20 ENCOUNTER — Telehealth: Payer: Self-pay

## 2019-03-20 LAB — NOVEL CORONAVIRUS, NAA (HOSP ORDER, SEND-OUT TO REF LAB; TAT 18-24 HRS): SARS-CoV-2, NAA: NOT DETECTED

## 2019-03-20 LAB — URINE CULTURE

## 2019-03-20 NOTE — Telephone Encounter (Signed)
Called to check on patient after being ED, spoke with wife and patient is feeling much better. Patient is resting, temperature is 98.6 now. No more vomiting.

## 2019-03-20 NOTE — Telephone Encounter (Signed)
Left message. Checked in with patient. Covid 19 test pending from ED evaluation

## 2019-03-21 ENCOUNTER — Encounter: Payer: Self-pay | Admitting: Internal Medicine

## 2019-03-21 ENCOUNTER — Telehealth: Payer: Self-pay | Admitting: Internal Medicine

## 2019-03-21 NOTE — Telephone Encounter (Signed)
Phone call to home. Wife answered. Pt feeling better today. Had chills and myalgias during ED evaluation. Feeling better today. Covid 19 test returned and is negative. Told wife of results. No travel history. Continue to monitor symptoms.

## 2019-03-25 ENCOUNTER — Ambulatory Visit: Payer: Medicare Other | Admitting: Internal Medicine

## 2019-03-25 ENCOUNTER — Telehealth: Payer: Self-pay

## 2019-03-25 ENCOUNTER — Other Ambulatory Visit: Payer: Self-pay

## 2019-03-25 VITALS — BP 102/60 | HR 66 | Temp 98.2°F | Ht 68.0 in | Wt 148.0 lb

## 2019-03-25 DIAGNOSIS — R531 Weakness: Secondary | ICD-10-CM

## 2019-03-25 DIAGNOSIS — R5383 Other fatigue: Secondary | ICD-10-CM

## 2019-03-25 DIAGNOSIS — D72829 Elevated white blood cell count, unspecified: Secondary | ICD-10-CM

## 2019-03-25 NOTE — Telephone Encounter (Signed)
Schedule OV

## 2019-03-25 NOTE — Telephone Encounter (Addendum)
Patient's wife called sates patient is doing better, the muscle pain and confusion have diminished and he is able work now. Temperature is 96.6. She wants to know if we can recheck his white cells bc he is not 100% better.   Call back number [phone].

## 2019-03-25 NOTE — Telephone Encounter (Signed)
Appointment scheduled.

## 2019-03-26 LAB — CBC WITH DIFFERENTIAL/PLATELET
Absolute Monocytes: 980 cells/uL — ABNORMAL HIGH (ref 200–950)
Basophils Absolute: 118 cells/uL (ref 0–200)
Basophils Relative: 1.2 %
Eosinophils Absolute: 578 cells/uL — ABNORMAL HIGH (ref 15–500)
Eosinophils Relative: 5.9 %
HCT: 42.7 % (ref 38.5–50.0)
Hemoglobin: 14.5 g/dL (ref 13.2–17.1)
Lymphs Abs: 2068 cells/uL (ref 850–3900)
MCH: 31.4 pg (ref 27.0–33.0)
MCHC: 34 g/dL (ref 32.0–36.0)
MCV: 92.4 fL (ref 80.0–100.0)
MPV: 8.8 fL (ref 7.5–12.5)
Monocytes Relative: 10 %
Neutro Abs: 6056 cells/uL (ref 1500–7800)
Neutrophils Relative %: 61.8 %
Platelets: 288 10*3/uL (ref 140–400)
RBC: 4.62 10*6/uL (ref 4.20–5.80)
RDW: 12.6 % (ref 11.0–15.0)
Total Lymphocyte: 21.1 %
WBC: 9.8 10*3/uL (ref 3.8–10.8)

## 2019-03-26 LAB — COMPLETE METABOLIC PANEL WITH GFR
AG Ratio: 1.4 (calc) (ref 1.0–2.5)
ALT: 38 U/L (ref 9–46)
AST: 34 U/L (ref 10–35)
Albumin: 3.7 g/dL (ref 3.6–5.1)
Alkaline phosphatase (APISO): 20 U/L — ABNORMAL LOW (ref 35–144)
BUN: 14 mg/dL (ref 7–25)
CO2: 29 mmol/L (ref 20–32)
Calcium: 10.3 mg/dL (ref 8.6–10.3)
Chloride: 102 mmol/L (ref 98–110)
Creat: 0.83 mg/dL (ref 0.70–1.11)
GFR, Est African American: 94 mL/min/{1.73_m2} (ref >=60)
GFR, Est Non African American: 81 mL/min/{1.73_m2} (ref >=60)
Globulin: 2.6 g/dL (calc) (ref 1.9–3.7)
Glucose, Bld: 87 mg/dL (ref 65–99)
Potassium: 4.6 mmol/L (ref 3.5–5.3)
Sodium: 138 mmol/L (ref 135–146)
Total Bilirubin: 0.7 mg/dL (ref 0.2–1.2)
Total Protein: 6.3 g/dL (ref 6.1–8.1)

## 2019-03-26 LAB — CK, TOTAL(REFL): Total CK: 77 U/L (ref 44–196)

## 2019-03-26 LAB — SEDIMENTATION RATE: Sed Rate: 9 mm/h (ref 0–20)

## 2019-03-26 LAB — TSH: TSH: 1.42 mIU/L (ref 0.40–4.50)

## 2019-04-11 ENCOUNTER — Telehealth: Payer: Self-pay | Admitting: Internal Medicine

## 2019-04-11 NOTE — Telephone Encounter (Signed)
That would be fine 

## 2019-04-11 NOTE — Telephone Encounter (Signed)
LVM to CB and schedule Flu Shot appointment

## 2019-04-11 NOTE — Telephone Encounter (Signed)
Jeffrey Meadows [phone]  [name] called to see if you thought it would be alright for them to go ahead and get their flu shots. They would like to get it now instead of waiting until Dace's CPE in October.

## 2019-04-13 ENCOUNTER — Encounter: Payer: Self-pay | Admitting: Internal Medicine

## 2019-04-13 NOTE — Progress Notes (Signed)
   Subjective:    Patient ID: Jeffrey Meadows, male    DOB: 1933-02-01, 85 y.o.   MRN: [id number]  HPI 85 year old Male in today for emergency department follow-up visit.  His wife called saying that he had fever, fatigue, myalgias, chills and one episode of vomiting.  History of COPD and does use nighttime oxygen.  Due to the coronavirus pandemic, emergency department evaluation was recommended although he had no known COVID-19 exposure.  He did say that he would visit the grocery store sometimes.  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  In the emergency department he was afebrile.  His pulse oximetry was 97%.  COVID-19 test was negative.  His white blood cell count was elevated at 12,700.   Previously white blood cell count had been 7100 in October 2019.  Electrolytes were normal.  Lipase was normal.  Urine culture obtained and revealed  multiple species.  Specific gravity of urine was 1.025.  He was felt to have a viral syndrome.  Chest x-ray was negative for pneumonia.  EKG was within normal limits.  He was given IV fluids.  Wife reports that at times he was delirious.  Patient reports that he could not turn off in his head a feeling that he was seen slot machines repeatedly spinning over and over again.  Wife is quite concerned about what happened to him but we do not have any clear answers other than he tested negative for COVID-19.  He did not appear to have pneumonia or septicemia.  Today we checked a total CK which was normal.  His white blood cell count has returned to normal at 9800.  However his eosinophils are 578 and his monocytes are 980.  Sed rate is 9.  C met is within normal limits with the exception of a low alkaline phosphatase of 20.  It is also my feeling as was the emergency department physicians feeling that he had some type of viral syndrome.  He seems to be feeling better.  Slowly regaining his strength.  Review of Systems no neurological deficits at this point in  time.  He is alert and oriented x3.     Objective:   Physical Exam Neck is supple.  Chest clear to auscultation.  Cardiac exam regular rate and rhythm normal S1 and S2.  His affect is normal.  Neurologically he is intact without gross focal deficits.  Abdomen is benign.       Assessment & Plan:  Probable viral syndrome  Tested negative for COVID-19  Plan: Continue close observation and call if any concerns.  35 minutes spent with patient and his wife today.

## 2019-04-13 NOTE — Patient Instructions (Signed)
It appears that you had a viral syndrome and are slowly improving.  Stay well-hydrated and call with any concerns.

## 2019-04-16 ENCOUNTER — Encounter: Payer: Self-pay | Admitting: Podiatry

## 2019-04-16 ENCOUNTER — Ambulatory Visit: Payer: Medicare Other | Admitting: Podiatry

## 2019-04-16 ENCOUNTER — Other Ambulatory Visit: Payer: Self-pay

## 2019-04-16 DIAGNOSIS — M79674 Pain in right toe(s): Secondary | ICD-10-CM | POA: Diagnosis not present

## 2019-04-16 DIAGNOSIS — B351 Tinea unguium: Secondary | ICD-10-CM | POA: Diagnosis not present

## 2019-04-16 DIAGNOSIS — M79675 Pain in left toe(s): Secondary | ICD-10-CM

## 2019-04-16 NOTE — Patient Instructions (Signed)
Corns and Calluses Corns are small areas of thickened skin that occur on the top, sides, or tip of a toe. They contain a cone-shaped core with a point that can press on a nerve below. This causes pain.  Calluses are areas of thickened skin that can occur anywhere on the body, including the hands, fingers, palms, soles of the feet, and heels. Calluses are usually larger than corns. What are the causes? Corns and calluses are caused by rubbing (friction) or pressure, such as from shoes that are too tight or do not fit properly. What increases the risk? Corns are more likely to develop in people who have misshapen toes (toe deformities), such as hammer toes. Calluses can occur with friction to any area of the skin. They are more likely to develop in people who:  Work with their hands.  Wear shoes that fit poorly, are too tight, or are high-heeled.  Have toe deformities. What are the signs or symptoms? Symptoms of a corn or callus include:  A hard growth on the skin.  Pain or tenderness under the skin.  Redness and swelling.  Increased discomfort while wearing tight-fitting shoes, if your feet are affected. If a corn or callus becomes infected, symptoms may include:  Redness and swelling that gets worse.  Pain.  Fluid, blood, or pus draining from the corn or callus. How is this diagnosed? Corns and calluses may be diagnosed based on your symptoms, your medical history, and a physical exam. How is this treated? Treatment for corns and calluses may include:  Removing the cause of the friction or pressure. This may involve: ? Changing your shoes. ? Wearing shoe inserts (orthotics) or other protective layers in your shoes, such as a corn pad. ? Wearing gloves.  Applying medicine to the skin (topical medicine) to help soften skin in the hardened, thickened areas.  Removing layers of dead skin with a file to reduce the size of the corn or callus.  Removing the corn or callus with a  scalpel or laser.  Taking antibiotic medicines, if your corn or callus is infected.  Having surgery, if a toe deformity is the cause. Follow these instructions at home:   Take over-the-counter and prescription medicines only as told by your health care provider.  If you were prescribed an antibiotic, take it as told by your health care provider. Do not stop taking it even if your condition starts to improve.  Wear shoes that fit well. Avoid wearing high-heeled shoes and shoes that are too tight or too loose.  Wear any padding, protective layers, gloves, or orthotics as told by your health care provider.  Soak your hands or feet and then use a file or pumice stone to soften your corn or callus. Do this as told by your health care provider.  Check your corn or callus every day for symptoms of infection. Contact a health care provider if you:  Notice that your symptoms do not improve with treatment.  Have redness or swelling that gets worse.  Notice that your corn or callus becomes painful.  Have fluid, blood, or pus coming from your corn or callus.  Have new symptoms. Summary  Corns are small areas of thickened skin that occur on the top, sides, or tip of a toe.  Calluses are areas of thickened skin that can occur anywhere on the body, including the hands, fingers, palms, and soles of the feet. Calluses are usually larger than corns.  Corns and calluses are caused by   rubbing (friction) or pressure, such as from shoes that are too tight or do not fit properly.  Treatment may include wearing any padding, protective layers, gloves, or orthotics as told by your health care provider. This information is not intended to replace advice given to you by your health care provider. Make sure you discuss any questions you have with your health care provider. Document Released: 05/06/2004 Document Revised: 11/20/2018 Document Reviewed: 06/13/2017 Elsevier Patient Education  2020 Elsevier  Inc.   Onychomycosis/Fungal Toenails  WHAT IS IT? An infection that lies within the keratin of your nail plate that is caused by a fungus.  WHY ME? Fungal infections affect all ages, sexes, races, and creeds.  There may be many factors that predispose you to a fungal infection such as age, coexisting medical conditions such as diabetes, or an autoimmune disease; stress, medications, fatigue, genetics, etc.  Bottom line: fungus thrives in a warm, moist environment and your shoes offer such a location.  IS IT CONTAGIOUS? Theoretically, yes.  You do not want to share shoes, nail clippers or files with someone who has fungal toenails.  Walking around barefoot in the same room or sleeping in the same bed is unlikely to transfer the organism.  It is important to realize, however, that fungus can spread easily from one nail to the next on the same foot.  HOW DO WE TREAT THIS?  There are several ways to treat this condition.  Treatment may depend on many factors such as age, medications, pregnancy, liver and kidney conditions, etc.  It is best to ask your doctor which options are available to you.  1. No treatment.   Unlike many other medical concerns, you can live with this condition.  However for many people this can be a painful condition and may lead to ingrown toenails or a bacterial infection.  It is recommended that you keep the nails cut short to help reduce the amount of fungal nail. 2. Topical treatment.  These range from herbal remedies to prescription strength nail lacquers.  About 40-50% effective, topicals require twice daily application for approximately 9 to 12 months or until an entirely new nail has grown out.  The most effective topicals are medical grade medications available through physicians offices. 3. Oral antifungal medications.  With an 80-90% cure rate, the most common oral medication requires 3 to 4 months of therapy and stays in your system for a year as the new nail grows out.   Oral antifungal medications do require blood work to make sure it is a safe drug for you.  A liver function panel will be performed prior to starting the medication and after the first month of treatment.  It is important to have the blood work performed to avoid any harmful side effects.  In general, this medication safe but blood work is required. 4. Laser Therapy.  This treatment is performed by applying a specialized laser to the affected nail plate.  This therapy is noninvasive, fast, and non-painful.  It is not covered by insurance and is therefore, out of pocket.  The results have been very good with a 80-95% cure rate.  [facility] is the only practice in the area to offer this therapy. 5. Permanent Nail Avulsion.  Removing the entire nail so that a new nail will not grow back. 

## 2019-04-23 NOTE — Progress Notes (Signed)
Subjective: Jeffrey Meadows is seen today for follow up painful, elongated, thickened toenails 1-5 b/l feet that he cannot cut. Pain interferes with daily activities. Aggravating factor includes wearing enclosed shoe gear and relieved with periodic debridement.  Objective:  Neurovascular status unchanged: Vascular Examination: Capillary refill time <3 seconds x 10 digits.  Dorsalis pedis pulses 0.4 left, 2/4 right.  Posterior tibial pulses 1/4 left, 0/4 right.  Digital hair absent x 10 digits.  Skin temperature gradient WNL b/l.  No pedal edema b/l.  Sensation intact 5/5 b/l with 10 gram monofilament.  Dermatological Examination: Skin with normal turgor, texture and tone b/l.  Toenails 1-5 b/l discolored, thick, dystrophic with subungual debris and pain with palpation to nailbeds due to thickness of nails.  Calluses resolved b/l.   Musculoskeletal: Muscle strength 5/5 to all LE muscle groups  Cavus foot type b/l.  Plantarflexed 1st ray b/l.  No pain, crepitus or joint limitation noted with ROM.   Assessment: Painful onychomycosis toenails 1-5 b/l   Plan: 1. Toenails 1-5 b/l were debrided in length and girth without iatrogenic bleeding. 2. Patient to continue soft, supportive shoe gear 3. Patient to report any pedal injuries to medical professional immediately. 4. Follow up 3 months.  5. Patient/POA to call should there be a concern in the interim.

## 2019-05-27 ENCOUNTER — Other Ambulatory Visit: Payer: Medicare Other | Admitting: Internal Medicine

## 2019-05-27 ENCOUNTER — Other Ambulatory Visit: Payer: Self-pay

## 2019-05-27 DIAGNOSIS — Z8709 Personal history of other diseases of the respiratory system: Secondary | ICD-10-CM

## 2019-05-27 DIAGNOSIS — I10 Essential (primary) hypertension: Secondary | ICD-10-CM

## 2019-05-27 DIAGNOSIS — Z0000 Encounter for general adult medical examination without abnormal findings: Secondary | ICD-10-CM

## 2019-05-27 DIAGNOSIS — Z87438 Personal history of other diseases of male genital organs: Secondary | ICD-10-CM

## 2019-05-27 DIAGNOSIS — R972 Elevated prostate specific antigen [PSA]: Secondary | ICD-10-CM

## 2019-05-28 LAB — COMPLETE METABOLIC PANEL WITH GFR
AG Ratio: 1.5 (calc) (ref 1.0–2.5)
ALT: 11 U/L (ref 9–46)
AST: 15 U/L (ref 10–35)
Albumin: 3.6 g/dL (ref 3.6–5.1)
Alkaline phosphatase (APISO): 19 U/L — ABNORMAL LOW (ref 35–144)
BUN: 14 mg/dL (ref 7–25)
CO2: 30 mmol/L (ref 20–32)
Calcium: 9.9 mg/dL (ref 8.6–10.3)
Chloride: 103 mmol/L (ref 98–110)
Creat: 0.74 mg/dL (ref 0.70–1.11)
GFR, Est African American: 98 mL/min/{1.73_m2} (ref >=60)
GFR, Est Non African American: 85 mL/min/{1.73_m2} (ref >=60)
Globulin: 2.4 g/dL (calc) (ref 1.9–3.7)
Glucose, Bld: 90 mg/dL (ref 65–99)
Potassium: 3.7 mmol/L (ref 3.5–5.3)
Sodium: 139 mmol/L (ref 135–146)
Total Bilirubin: 0.6 mg/dL (ref 0.2–1.2)
Total Protein: 6 g/dL — ABNORMAL LOW (ref 6.1–8.1)

## 2019-05-28 LAB — CBC WITH DIFFERENTIAL/PLATELET
Absolute Monocytes: 861 cells/uL (ref 200–950)
Basophils Absolute: 87 cells/uL (ref 0–200)
Basophils Relative: 1.1 %
Eosinophils Absolute: 561 cells/uL — ABNORMAL HIGH (ref 15–500)
Eosinophils Relative: 7.1 %
HCT: 42.8 % (ref 38.5–50.0)
Hemoglobin: 14.5 g/dL (ref 13.2–17.1)
Lymphs Abs: 1572 cells/uL (ref 850–3900)
MCH: 31.9 pg (ref 27.0–33.0)
MCHC: 33.9 g/dL (ref 32.0–36.0)
MCV: 94.3 fL (ref 80.0–100.0)
MPV: 9.4 fL (ref 7.5–12.5)
Monocytes Relative: 10.9 %
Neutro Abs: 4819 cells/uL (ref 1500–7800)
Neutrophils Relative %: 61 %
Platelets: 263 10*3/uL (ref 140–400)
RBC: 4.54 10*6/uL (ref 4.20–5.80)
RDW: 13.1 % (ref 11.0–15.0)
Total Lymphocyte: 19.9 %
WBC: 7.9 10*3/uL (ref 3.8–10.8)

## 2019-05-28 LAB — LIPID PANEL
Cholesterol: 133 mg/dL (ref <200)
HDL: 64 mg/dL (ref >=40)
LDL Cholesterol (Calc): 56 mg/dL (calc)
Non-HDL Cholesterol (Calc): 69 mg/dL (calc) (ref <130)
Total CHOL/HDL Ratio: 2.1 (calc) (ref <5.0)
Triglycerides: 58 mg/dL (ref <150)

## 2019-05-28 LAB — PSA: PSA: 1.3 ng/mL (ref <=4.0)

## 2019-06-03 ENCOUNTER — Other Ambulatory Visit: Payer: Self-pay

## 2019-06-03 ENCOUNTER — Encounter: Payer: Self-pay | Admitting: Internal Medicine

## 2019-06-03 ENCOUNTER — Ambulatory Visit (INDEPENDENT_AMBULATORY_CARE_PROVIDER_SITE_OTHER): Payer: Medicare Other | Admitting: Internal Medicine

## 2019-06-03 VITALS — BP 130/70 | HR 77 | Temp 97.3°F | Ht 66.0 in | Wt 148.0 lb

## 2019-06-03 DIAGNOSIS — Z0000 Encounter for general adult medical examination without abnormal findings: Secondary | ICD-10-CM

## 2019-06-03 DIAGNOSIS — J449 Chronic obstructive pulmonary disease, unspecified: Secondary | ICD-10-CM

## 2019-06-03 DIAGNOSIS — Z87438 Personal history of other diseases of male genital organs: Secondary | ICD-10-CM | POA: Diagnosis not present

## 2019-06-03 DIAGNOSIS — G8929 Other chronic pain: Secondary | ICD-10-CM

## 2019-06-03 DIAGNOSIS — Z8679 Personal history of other diseases of the circulatory system: Secondary | ICD-10-CM

## 2019-06-03 DIAGNOSIS — I10 Essential (primary) hypertension: Secondary | ICD-10-CM | POA: Diagnosis not present

## 2019-06-03 DIAGNOSIS — N401 Enlarged prostate with lower urinary tract symptoms: Secondary | ICD-10-CM | POA: Diagnosis not present

## 2019-06-03 DIAGNOSIS — M25552 Pain in left hip: Secondary | ICD-10-CM | POA: Diagnosis not present

## 2019-06-03 DIAGNOSIS — R351 Nocturia: Secondary | ICD-10-CM

## 2019-06-03 LAB — POCT URINALYSIS DIPSTICK
Bilirubin, UA: NEGATIVE
Blood, UA: NEGATIVE
Glucose, UA: NEGATIVE
Ketones, UA: NEGATIVE
Leukocytes, UA: NEGATIVE
Nitrite, UA: NEGATIVE
Protein, UA: POSITIVE — AB
Spec Grav, UA: 1.015 (ref 1.010–1.025)
Urobilinogen, UA: 0.2 E.U./dL
pH, UA: 7.5 (ref 5.0–8.0)
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
## 2019-06-14 NOTE — Patient Instructions (Signed)
It was a pleasure to see you today.  Continue current medications and follow-up in 1 year or as needed.

## 2019-07-01 ENCOUNTER — Telehealth: Payer: Self-pay | Admitting: Internal Medicine

## 2019-07-01 MED ORDER — TRAMADOL HCL 50 MG PO TABS: ORAL_TABLET | ORAL | 0 refills | Status: DC

## 2019-07-01 NOTE — Telephone Encounter (Signed)
Discussion with patient and wife regarding patient's chronic hip and multi-joint pain. NSAID does not always provide relief. Will try Tramadol once or twice daily with food.

## 2019-07-09 ENCOUNTER — Other Ambulatory Visit: Payer: Self-pay

## 2019-07-09 MED ORDER — ATORVASTATIN CALCIUM 20 MG PO TABS: 20.0000 mg | ORAL_TABLET | Freq: Every day | ORAL | 3 refills | Status: DC

## 2019-07-23 ENCOUNTER — Ambulatory Visit: Payer: Medicare Other | Admitting: Podiatry

## 2019-09-02 DIAGNOSIS — L570 Actinic keratosis: Secondary | ICD-10-CM | POA: Diagnosis not present

## 2019-09-02 DIAGNOSIS — L308 Other specified dermatitis: Secondary | ICD-10-CM | POA: Diagnosis not present

## 2019-09-04 ENCOUNTER — Ambulatory Visit: Payer: Medicare PPO | Attending: Internal Medicine

## 2019-09-04 DIAGNOSIS — Z23 Encounter for immunization: Secondary | ICD-10-CM

## 2019-09-04 NOTE — Progress Notes (Signed)
   Covid-19 Vaccination Clinic  Name:  Jeffrey Meadows    MRN: [id number] DOB: 1933/03/14  09/04/2019  Jeffrey Meadows was observed post Covid-19 immunization for 15 minutes without incidence. He was provided with Vaccine Information Sheet and instruction to access the V-Safe system.   Jeffrey Meadows was instructed to call 911 with any severe reactions post vaccine: Marland Kitchen Difficulty breathing  . Swelling of your face and throat  . A fast heartbeat  . A bad rash all over your body  . Dizziness and weakness    Immunizations Administered    Name Date Dose VIS Date Route   Pfizer COVID-19 Vaccine 09/04/2019  3:23 PM 0.3 mL 07/25/2019 Intramuscular   Manufacturer: [place]   Lot: [id number]   [place]: [id number]

## 2019-09-09 ENCOUNTER — Other Ambulatory Visit: Payer: Self-pay | Admitting: Internal Medicine

## 2019-09-25 ENCOUNTER — Ambulatory Visit: Payer: Medicare PPO | Attending: Internal Medicine

## 2019-09-25 DIAGNOSIS — J449 Chronic obstructive pulmonary disease, unspecified: Secondary | ICD-10-CM | POA: Diagnosis not present

## 2019-09-25 DIAGNOSIS — Z23 Encounter for immunization: Secondary | ICD-10-CM | POA: Insufficient documentation

## 2019-09-25 NOTE — Progress Notes (Signed)
   Covid-19 Vaccination Clinic  Name:  Jeffrey Meadows    MRN: [id number] DOB: 1933-08-09  09/25/2019  Jeffrey Meadows was observed post Covid-19 immunization for 15 minutes without incidence. He was provided with Vaccine Information Sheet and instruction to access the V-Safe system.   Jeffrey Meadows was instructed to call 911 with any severe reactions post vaccine: Marland Kitchen Difficulty breathing  . Swelling of your face and throat  . A fast heartbeat  . A bad rash all over your body  . Dizziness and weakness    Immunizations Administered    Name Date Dose VIS Date Route   Pfizer COVID-19 Vaccine 09/25/2019  4:49 PM 0.3 mL 07/25/2019 Intramuscular   Manufacturer: [place]   Lot: [id number]   [place]: [id number]

## 2019-10-01 ENCOUNTER — Ambulatory Visit: Payer: Medicare Other | Admitting: Podiatry

## 2019-10-01 ENCOUNTER — Encounter: Payer: Self-pay | Admitting: Podiatry

## 2019-10-01 ENCOUNTER — Other Ambulatory Visit: Payer: Self-pay

## 2019-10-01 DIAGNOSIS — M79674 Pain in right toe(s): Secondary | ICD-10-CM

## 2019-10-01 DIAGNOSIS — B351 Tinea unguium: Secondary | ICD-10-CM | POA: Diagnosis not present

## 2019-10-01 DIAGNOSIS — M79675 Pain in left toe(s): Secondary | ICD-10-CM

## 2019-10-01 DIAGNOSIS — I739 Peripheral vascular disease, unspecified: Secondary | ICD-10-CM

## 2019-10-01 NOTE — Patient Instructions (Signed)

## 2019-10-02 NOTE — Progress Notes (Signed)
Subjective: Jeffrey Meadows presents today for follow up of painful mycotic nails b/l that are difficult to trim. Pain interferes with ambulation. Aggravating factors include wearing enclosed shoe gear. Pain is relieved with periodic professional debridement.   Jeffrey Meadows has brought in a new pair of [employer] shoes and would like his insoles modified.  Allergies  Allergen Reactions  . Antihistamines, Diphenhydramine-Type Other (See Comments)    Enlarged prostate     Objective: There were no vitals filed for this visit.  Vascular Examination:  Capillary fill time to digits <3s b/l, DP pulses 0/4 left, 2/4 right; PT pulses 1/4 left, 0/4 right, pedal hair absent b/l and skin temperature gradient within normal limits b/l  Dermatological Examination: Pedal skin with normal turgor, texture and tone bilaterally, no open wounds bilaterally, no interdigital macerations bilaterally and toenails 1-5 b/l elongated, dystrophic, thickened, crumbly with subungual debris  Musculoskeletal: Normal muscle strength 5/5 to all lower extremity muscle groups bilaterally, no pain crepitus or joint limitation noted with ROM b/l, pes cavus deformity noted and plantarflexed first ray b/l  Neurological: Protective sensation intact 5/5 intact bilaterally with 10g monofilament b/l  Assessment: 1. Pain due to onychomycosis of toenails of both feet   2. PAD (peripheral artery disease) (HCC)    Plan: -Toenails 1-5 b/l were debrided in length and girth without iatrogenic bleeding. -Pedorthist Rick modified new insoles at request of patient. -Patient to continue soft, supportive shoe gear daily. -Patient to report any pedal injuries to medical professional immediately. -Patient/POA to call should there be question/concern in the interim.  Return in about 3 months (around 12/29/2019) for nail and callus trim.

## 2019-10-03 ENCOUNTER — Other Ambulatory Visit: Payer: Self-pay

## 2019-10-03 ENCOUNTER — Ambulatory Visit: Payer: Medicare PPO | Admitting: Podiatry

## 2019-10-03 DIAGNOSIS — M79672 Pain in left foot: Secondary | ICD-10-CM | POA: Diagnosis not present

## 2019-10-03 DIAGNOSIS — R234 Changes in skin texture: Secondary | ICD-10-CM

## 2019-10-03 DIAGNOSIS — S91302A Unspecified open wound, left foot, initial encounter: Secondary | ICD-10-CM

## 2019-10-03 NOTE — Patient Instructions (Signed)
Diagnosis: Cracked heel fissure with bleeding left foot  1. Apply Neosporin ointment and band-aid to right heel once daily for one week. 2. For weeks 2-3, apply Neosporin ointment to heel fissure left foot once daily. 3. Apply Aquaphor Ointment to both feet for dry skin.   Follow up 3 weeks.

## 2019-10-09 ENCOUNTER — Encounter: Payer: Self-pay | Admitting: Podiatry

## 2019-10-09 NOTE — Progress Notes (Signed)
Subjective: Jeffrey Meadows presents today for follow up of follow up evaluation of left heel. Wife called this morning and informed call intake his left heel was cut on yesterday's visit. She was very upset. She states she noticed blood on floor in bathroom this morning and assumed this was from his visit on yesterday. Jeffrey Meadows relates area is tender and denies any injury between yesterday and today's visit..   Allergies  Allergen Reactions  . Antihistamines, Diphenhydramine-Type Other (See Comments)    Enlarged prostate     Objective: There were no vitals filed for this visit.  Neurovascular status unchanged from yesterday's visit.  DP 0/4 left, 2/4 right; PT pulses 1/4 left, 0/4 right foot. No ischemia noted b/l.   Dermatological Examination: Toenails are mycotic and recently debrided. He has longitudinal heel fissure on lateral aspect of left heel which has cracked and there is evidence of  prior bleeding episode. No edema, no erythema, no purulence. There is tenderness to palpation.  Musculoskeletal: Normal muscle strength 5/5 to all lower extremity muscle groups bilaterally, no pain crepitus or joint limitation noted with ROM b/l, pes cavus deformity noted and plantarflexed first ray b/l.  Neurological: Protective sensation intact 5/5 intact bilaterally with 10g monofilament b/l  Assessment: 1. Fissure in skin of foot   2. Open wound of left foot, initial encounter   3. Pain in left foot    Plan: -Wife was informed no skin was trimmed on yesterday's visit; only his toenails were debrided and he was seen by Pedorthist to add padding to a new pair of [employer] he brought in. -Patient to apply Neosporin Cream to left heel skin fissure with bandaid for one week. For weeks 2 and 3 apply Neosporin Ointment to heel fissure once daily. Wife was also instructed to apply Aquaphor Ointment to both feet daily once daily. Wife appeared pleased with outcome of today's visit and  states she was worried about the bleeding. She states she may cancel his 3 week follow up visit for him if it resolves and may come to see me herself in his place. -Patient to continue soft, supportive shoe gear daily. -Patient to report any pedal injuries to medical professional immediately. -Patient/POA to call should there be question/concern in the interim.  Return in about 3 weeks (around 10/24/2019) for left heel fissure.

## 2019-10-13 ENCOUNTER — Other Ambulatory Visit: Payer: Self-pay | Admitting: Internal Medicine

## 2019-10-23 DIAGNOSIS — J449 Chronic obstructive pulmonary disease, unspecified: Secondary | ICD-10-CM | POA: Diagnosis not present

## 2019-10-24 ENCOUNTER — Encounter: Payer: Self-pay | Admitting: Podiatry

## 2019-10-24 ENCOUNTER — Other Ambulatory Visit: Payer: Self-pay

## 2019-10-24 ENCOUNTER — Ambulatory Visit: Payer: Medicare PPO | Admitting: Podiatry

## 2019-10-24 VITALS — HR 97

## 2019-10-24 DIAGNOSIS — R234 Changes in skin texture: Secondary | ICD-10-CM

## 2019-10-24 NOTE — Patient Instructions (Signed)

## 2019-10-28 ENCOUNTER — Ambulatory Visit: Payer: Medicare PPO | Admitting: Sports Medicine

## 2019-10-28 ENCOUNTER — Other Ambulatory Visit: Payer: Self-pay

## 2019-10-28 ENCOUNTER — Ambulatory Visit: Payer: Self-pay

## 2019-10-28 VITALS — BP 124/60 | Ht 67.0 in | Wt 150.0 lb

## 2019-10-28 DIAGNOSIS — M25362 Other instability, left knee: Secondary | ICD-10-CM

## 2019-10-28 DIAGNOSIS — M25562 Pain in left knee: Secondary | ICD-10-CM

## 2019-10-28 DIAGNOSIS — M25369 Other instability, unspecified knee: Secondary | ICD-10-CM | POA: Insufficient documentation

## 2019-10-28 NOTE — Progress Notes (Addendum)
    SUBJECTIVE:   CHIEF COMPLAINT: knee giving way  HPI:   Patient endorses L>R knee discomfort and feeling like knee is giving out when walking, turning, or walking up stairs with occasional locking and catching.  Denies any falls.  Occurs more in the morning.  Denies any prior knee surgeries, trauma or injury.  Pain occasionally wakes him up from sleep at night.  Takes naproxen as needed for pain relief.  Also with bilateral ankle pain and swelling that he feels is contributing to his knee instability.  PERTINENT  PMH / PSH: HTN, PVD, COPD, allergic rhinitis, essential tremor, psoriasis, chronic hip pain, HLD, claudication, alcoholism  OBJECTIVE:   BP 124/60   Ht 5\' 7"  (1.702 m)   Wt 150 lb (68 kg)   BMI 23.49 kg/m   GEN: Elderly, chronically ill-appearing, in NAD MSK: Knee, Ankle: Inspection: Knobby with some medial joint spurring appreciated, L>R.  No redness. Swelling to bilateral ankles. Palpation: nonTTP along bilateral joint lines. ROM: Good knee flexion, extension.  Strength: 5/5 quad flexion, hip abduction with flexion. Stability: no joint laxity Neurovascular: intact  Limited US of Left knee Preserved joint space with minimal swelling around L medial meniscus.  No effusion in suprapatellar pouch.  No other tendonous or bony abnormalities. Narrow joint space at midline medially  Impression: mild arthritis of left knee  Limited US of ankle Profound cortical irregularities of talus with swelling around talar dome Loss of articular cartilage articulation with navicular and talus consistent with arthritis and hypoechoic change showing effusion  Impression:  Arthritis of tibio-talar joint and of mid foot  Ultrasound and interpretation by Dr. Ky Barban and Wolfgang Phoenix. Fields, MD   ASSESSMENT/PLAN:   Knee instability No evidence of marked knee arthritis on [place]. Does have profound ankle arthritis which is likely contributing. Will recommend hip abduction strengthening  exercises and provide knee brace to aid in additional support. Already has appropriate insoles. Can continue NSAIDs prn for pain relief. Follow up prn.   Rory Percy, DO PGY-3, [facility] 10/28/2019 4:31 PM   I observed and examined the patient with the resident and agree with assessment and plan.  Note reviewed and modified by me. Ila Mcgill, MD

## 2019-10-28 NOTE — Patient Instructions (Signed)
It was great to see you!  Our plans for today:  - Try the knee brace for additional support and stability. - Continue to use the triamcinolone cream for his psoraisis. Use vaseline, aquaphor, or petroleum jelly in between steroid cream treamtents. - Do hip abduction strengthening exercises a few times daily as prescribed.    Take care and seek immediate care sooner if you develop any concerns.

## 2019-10-28 NOTE — Assessment & Plan Note (Signed)
No evidence of marked knee arthritis on [place]. Does have profound ankle arthritis which is likely contributing. Will recommend hip abduction strengthening exercises and provide knee brace to aid in additional support. Already has appropriate insoles. Can continue NSAIDs prn for pain relief. Follow up prn.

## 2019-10-30 NOTE — Progress Notes (Signed)
Subjective: Jeffrey Meadows presents today for follow up of heel fissure left foot. Wife states she has been putting Neosporin and  Aquaphor Ointment on left foot daily. [name] states his foot is feeing better.  Allergies  Allergen Reactions  . Antihistamines, Diphenhydramine-Type Other (See Comments)    Enlarged prostate    Objective: Vitals:   10/24/19 1314  Pulse: 93   Pt 86 y.o. year old Caucasian male  in NAD. AAO x 3.   Vascular Examination:  Capillary refill time to digits immediate b/l. Skin temperature gradient within normal limits b/l. DP 0/4 left, 2/4 right; PT 1/4 left, 0/4 right foot. No ischemia noted b/l.  Dermatological Examination: Pedal skin with normal turgor, texture and tone bilaterally. No open wounds bilaterally. No interdigital macerations bilaterally. Toenails recently debrided. Heel fissure healed posterior left heel. No pain on palpation. No erythema, no erythema, no drainage, no flocculence.  Musculoskeletal: Normal muscle strength 5/5 to all lower extremity muscle groups bilaterally, no gross bony deformities bilaterally and no pain crepitus or joint limitation noted with ROM b/l.  Neurological: Protective sensation intact 5/5 intact bilaterally with 10g monofilament b/l Vibratory sensation intact b/l.  Assessment: 1. Fissure in skin of foot    Plan: -Heel fissure healed left foot. Continue Aquaphor Ointment to left foot daily. -Patient to continue soft, supportive shoe gear daily. -Patient to report any pedal injuries to medical professional immediately. -Patient/POA to call should there be question/concern in the interim.  Return in about 9 weeks (around 12/26/2019) for nail and callus trim.

## 2019-11-23 DIAGNOSIS — J449 Chronic obstructive pulmonary disease, unspecified: Secondary | ICD-10-CM | POA: Diagnosis not present

## 2019-12-11 DIAGNOSIS — R269 Unspecified abnormalities of gait and mobility: Secondary | ICD-10-CM | POA: Diagnosis not present

## 2019-12-11 DIAGNOSIS — M545 Low back pain: Secondary | ICD-10-CM | POA: Diagnosis not present

## 2019-12-22 ENCOUNTER — Other Ambulatory Visit: Payer: Self-pay | Admitting: Internal Medicine

## 2019-12-22 NOTE — Telephone Encounter (Signed)
Patient is due for Medicare wellness and CPE in October. Please book before refilling until then

## 2019-12-23 DIAGNOSIS — J449 Chronic obstructive pulmonary disease, unspecified: Secondary | ICD-10-CM | POA: Diagnosis not present

## 2019-12-24 DIAGNOSIS — R269 Unspecified abnormalities of gait and mobility: Secondary | ICD-10-CM | POA: Diagnosis not present

## 2019-12-24 DIAGNOSIS — M545 Low back pain: Secondary | ICD-10-CM | POA: Diagnosis not present

## 2019-12-31 DIAGNOSIS — R269 Unspecified abnormalities of gait and mobility: Secondary | ICD-10-CM | POA: Diagnosis not present

## 2019-12-31 DIAGNOSIS — M545 Low back pain: Secondary | ICD-10-CM | POA: Diagnosis not present

## 2020-01-05 ENCOUNTER — Other Ambulatory Visit: Payer: Self-pay

## 2020-01-05 ENCOUNTER — Ambulatory Visit: Payer: Medicare PPO | Admitting: Podiatry

## 2020-01-05 ENCOUNTER — Encounter: Payer: Self-pay | Admitting: Podiatry

## 2020-01-05 DIAGNOSIS — M79674 Pain in right toe(s): Secondary | ICD-10-CM

## 2020-01-05 DIAGNOSIS — M79675 Pain in left toe(s): Secondary | ICD-10-CM | POA: Diagnosis not present

## 2020-01-05 DIAGNOSIS — B351 Tinea unguium: Secondary | ICD-10-CM

## 2020-01-05 DIAGNOSIS — I739 Peripheral vascular disease, unspecified: Secondary | ICD-10-CM | POA: Diagnosis not present

## 2020-01-05 NOTE — Patient Instructions (Signed)

## 2020-01-07 DIAGNOSIS — M545 Low back pain: Secondary | ICD-10-CM | POA: Diagnosis not present

## 2020-01-07 DIAGNOSIS — R269 Unspecified abnormalities of gait and mobility: Secondary | ICD-10-CM | POA: Diagnosis not present

## 2020-01-12 NOTE — Progress Notes (Signed)
Subjective: Jeffrey Meadows is a 86 y.o. male patient seen today for at risk foot care. Patient has h/o PAD and painful mycotic nails b/l that are difficult to trim. Pain interferes with ambulation. Aggravating factors include wearing enclosed shoe gear. Pain is relieved with periodic professional debridement  Patient Active Problem List   Diagnosis Date Noted  . Knee instability 10/28/2019  . Chronic respiratory failure with hypoxia ([place]) 10/02/2017  . Acute midline low back pain without sciatica 06/29/2016  . Chronic alcoholism ([place]) 06/29/2016  . Osteoarthritis of cervical spine 03/01/2016  . Strain of left trapezius muscle 10/21/2015  . Peripheral vascular disease, unspecified ([place]) 12/02/2013  . Essential tremor 10/14/2013  . Hoarseness of voice 04/12/2013  . Nasal septal deviation 07/16/2012  . Chronic foot pain, left 05/09/2012  . Claudication in peripheral vascular disease ([place]) 11/21/2011  . Constipation 03/09/2011  . Hearing impairment 03/09/2011  . Allergic rhinitis 02/02/2011  . Hip pain, chronic 06/08/2010  . Tobacco use disorder, severe, in sustained remission 03/18/2010  . BPH with obstruction/lower urinary tract symptoms 06/17/2008  . ANOMALY, CONGENITAL HYPOSPADIAS 04/23/2007  . Hyperlipidemia 10/11/2006  . HYPERTENSION, BENIGN SYSTEMIC 10/11/2006  . HEMORRHOIDS, NOS 10/11/2006  . COPD with emphysema ([place]) 10/11/2006  . PSORIASIS 10/11/2006  . GAIT, ABNORMAL 10/11/2006    Current Outpatient Medications on File Prior to Visit  Medication Sig Dispense Refill  . albuterol (PROAIR HFA) 108 (90 Base) MCG/ACT inhaler Inhale 1-2 puffs into the lungs every 4 (four) hours as needed. as needed shortness of breath prior to exercise (Patient taking differently: Inhale 1-2 puffs into the lungs every 4 (four) hours as needed for wheezing or shortness of breath (shortness of breath prior to exercise). ) 54 g 3  . AMBULATORY NON FORMULARY MEDICATION Ketoprofen 20% cream 1 Tube 2   . amLODipine (NORVASC) 10 MG tablet TAKE 1 TABLET BY MOUTH EVERY DAY 90 tablet 1  . atorvastatin (LIPITOR) 20 MG tablet Take 1 tablet (20 mg total) by mouth daily. 90 tablet 3  . calcipotriene (DOVONOX) 0.005 % ointment Apply 1 application topically daily. Applies to affected area.  3  . Cholecalciferol (VITAMIN D) 2000 UNITS CAPS Take 2,000 Units by mouth 2 (two) times a week.     . diphenoxylate-atropine (LOMOTIL) 2.5-0.025 MG tablet Take 1 tablet by mouth every 12 (twelve) hours as needed for diarrhea or loose stools. 30 tablet 0  . docusate sodium (COLACE) 100 MG capsule Take 100 mg by mouth daily.    . finasteride (PROSCAR) 5 MG tablet Take 5 mg by mouth daily.  3  . hydrochlorothiazide (HYDRODIURIL) 25 MG tablet TAKE 1 TABLET BY MOUTH EVERY DAY IN THE MORNING 90 tablet 3  . HYDROcodone-acetaminophen (NORCO) 5-325 MG tablet Take 1 tablet by mouth every 4 (four) hours as needed for moderate pain. 30 tablet 0  . KLOR-CON 10 10 MEQ tablet TAKE ONE TABLET BY MOUTH ONE TIME DAILY (Patient taking differently: Take 10 mEq by mouth daily. ) 90 tablet 3  . naproxen (NAPROSYN) 500 MG tablet TAKE 1 TABLET BY MOUTH TWICE A DAY WITH FOOD 180 tablet 1  . polyethylene glycol powder (GLYCOLAX/MIRALAX) powder Take 17 g by mouth 2 (two) times daily as needed. 3350 g 11  . tamsulosin (FLOMAX) 0.4 MG CAPS capsule Take 0.8 mg by mouth daily.   11  . theophylline (THEODUR) 200 MG 12 hr tablet Take 1 tablet (200 mg total) by mouth 2 (two) times daily. With meals 60 tablet 12  .  Tiotropium Bromide-Olodaterol (STIOLTO RESPIMAT) 2.5-2.5 MCG/ACT AERS Inhale 2 puffs into the lungs daily. 12 g 3  . traMADol (ULTRAM) 50 MG tablet One po with food once or twice daily as needed for chronic osteoarthritis pain 30 tablet 0  . triamcinolone cream (KENALOG) 0.1 % Apply 1 application topically 2 (two) times daily. 30 g 0   No current facility-administered medications on file prior to visit.    Allergies  Allergen Reactions   . Antihistamines, Diphenhydramine-Type Other (See Comments)    Enlarged prostate    Objective: Physical Exam  General: 86 y.o. Caucasian male no acute distress, awake, alert and oriented x 3  Neurovascular status unchanged b/l.  Capillary refill time to digits immediate b/l. Palpable DP pulses b/l. Palpable PT pulses b/l. DP pulse nonpalpable left foot. DP pulse palpable right foot. PT pulse faintly palpable left foot. PT pulse nonpalpable right foot. Pedal hair present b/l. Skin temperature gradient within normal limits b/l.  Protective sensation intact 5/5 intact bilaterally with 10g monofilament b/l. Vibratory sensation intact b/l.  Dermatological:  Pedal skin with normal turgor, texture and tone bilaterally. No open wounds bilaterally. No interdigital macerations bilaterally. Toenails 1-5 b/l elongated, discolored, dystrophic, thickened, crumbly with subungual debris and tenderness to dorsal palpation.  Musculoskeletal:  Normal muscle strength 5/5 to all lower extremity muscle groups bilaterally. No gross bony deformities bilaterally. No pain crepitus or joint limitation noted with ROM b/l.  Assessment and Plan:  1. Pain due to onychomycosis of toenails of both feet   2. PAD (peripheral artery disease) ([place])    -Examined patient. -No new findings. No new orders. -Toenails 1-5 b/l were debrided in length and girth with sterile nail nippers and dremel without iatrogenic bleeding.  -Patient to continue soft, supportive shoe gear daily. -Patient to report any pedal injuries to medical professional immediately. -Patient/POA to call should there be question/concern in the interim.  Return in about 3 months (around 04/06/2020) for nail trim.  Marzetta Board, DPM

## 2020-01-16 DIAGNOSIS — M545 Low back pain: Secondary | ICD-10-CM | POA: Diagnosis not present

## 2020-01-16 DIAGNOSIS — R269 Unspecified abnormalities of gait and mobility: Secondary | ICD-10-CM | POA: Diagnosis not present

## 2020-01-23 DIAGNOSIS — M545 Low back pain: Secondary | ICD-10-CM | POA: Diagnosis not present

## 2020-01-23 DIAGNOSIS — R269 Unspecified abnormalities of gait and mobility: Secondary | ICD-10-CM | POA: Diagnosis not present

## 2020-01-23 DIAGNOSIS — J449 Chronic obstructive pulmonary disease, unspecified: Secondary | ICD-10-CM | POA: Diagnosis not present

## 2020-02-05 ENCOUNTER — Ambulatory Visit (INDEPENDENT_AMBULATORY_CARE_PROVIDER_SITE_OTHER): Payer: Medicare PPO

## 2020-02-05 ENCOUNTER — Encounter: Payer: Self-pay | Admitting: Internal Medicine

## 2020-02-05 ENCOUNTER — Other Ambulatory Visit: Payer: Self-pay

## 2020-02-05 ENCOUNTER — Ambulatory Visit (INDEPENDENT_AMBULATORY_CARE_PROVIDER_SITE_OTHER): Payer: Medicare PPO | Admitting: Internal Medicine

## 2020-02-05 VITALS — BP 112/72 | HR 69 | Temp 97.3°F | Ht 66.5 in | Wt 150.8 lb

## 2020-02-05 DIAGNOSIS — J439 Emphysema, unspecified: Secondary | ICD-10-CM

## 2020-02-05 DIAGNOSIS — R269 Unspecified abnormalities of gait and mobility: Secondary | ICD-10-CM | POA: Diagnosis not present

## 2020-02-05 DIAGNOSIS — J449 Chronic obstructive pulmonary disease, unspecified: Secondary | ICD-10-CM | POA: Diagnosis not present

## 2020-02-05 NOTE — Progress Notes (Signed)
HPI male former smoker (60 pack years) followed for COPD/emphysema, chronic loculated right pneumothorax, complicated by BPH, PAD/ ASCVD PFT- 10/04/2012-severe obstructive airways disease with insignificant response to bronchodilator, emphysema pattern. Air-trapping with increased residual volume. Diffusion moderately reduced. FVC 3.03/78%, FEV11.11/44%, FEV1/FVC 0.37. TLC 115%, RV 137%, DLCO 52%. 6MWT- 10/04/2012-92%, 83%, 91%, 336 m. Significant desaturation with exertion Walk Test on room air 10/01/17-desaturated to 85% by LAP 3 with HR 94.  Corrected on oxygen 2 L saturation 97%. -------------------------------------------------------------------   02/05/2019- Virtual Visit via Telephone Note  History of Present Illness: 86 year old male former smoker (60 pack year) followed for emphysema, chronic loculated right pneumothorax, complicated by BPH, PAD/ ASCVD Pro-air HFA, Stiolto Respimat 2.5, theophylline 200 mg twice daily O2 2L sleep and prn/, APS -----breathing at baseline, on O2 2L, using albuterol and Stiolto (needs refill, by mail from local pharmacy He denies changes or acute episodes.   Observations/Objective:   Assessment and Plan:   Follow Up Instructions:  ---------------------- 02/05/20- 86 year old male former smoker (60 pack year) followed for emphysema, chronic loculated right pneumothorax, complicated by BPH, PAD/ ASCVD Pro-air HFA, Stiolto Respimat 2.5, theophylline 200 mg twice daily O2 2L sleep and prn/, APS Covax- 2 Phizer Breathing stable. No acute. Wife says he snores and falls asleep at desk in AM, 2 hr nap after lunch. He thinks he sleeps ok and doesn't mind if he gets drowsy. Not driving.  CXR 03/19/2019- IMPRESSION: 1. No acute cardiopulmonary abnormality. 2. Chronic lung disease.  Aortic Atherosclerosis (ICD10-I70.0).  ROS-see HPI + = positive Constitutional:   No-   weight loss, night sweats, fevers, chills, fatigue, lassitude. HEENT:   No-   headaches, difficulty swallowing, tooth/dental problems, sore throat,       No-  sneezing, itching, ear ache, nasal congestion, post nasal drip,  CV:  No-   chest pain, orthopnea, PND, swelling in lower extremities, anasarca,  +dizziness, palpitations Resp: +  shortness of breath with exertion or at rest.             No- productive cough,  No non-productive cough,  No- coughing up of blood.              No-   change in color of mucus.  No- wheezing.   Skin: No-   rash or lesions. GI:  No-   heartburn, indigestion, abdominal pain, nausea, vomiting, GU: MS:  No-   joint pain or swelling. . Neuro-     nothing unusual Psych:  No- change in mood or affect. No depression or anxiety.  No memory loss.  OBJ- Physical Exam    General- Alert, Oriented, Affect-appropriate, Distress- none acute, trim Skin- rash-none, lesions- none, excoriation- none, pale Lymphadenopathy- none Head- atraumatic            Eyes- Gross vision intact, PERRLA, conjunctivae and secretions clear            Ears- Hearing, canals-normal            Nose- Clear, no-Septal dev, mucus, polyps, erosion, perforation             Throat- Mallampati III , mucosa clear , drainage- none, tonsils- atrophic. No thrush Neck- flexible , trachea midline, no stridor , thyroid nl, carotid no bruit Chest - symmetrical excursion , unlabored           Heart/CV- RRR , no murmur , no gallop  , no rub, nl s1 s2                           -  JVD- none , edema- none, stasis changes- none, varices- none           Lung- +distant but clear to P&A, wheeze- none, cough-none , dullness-none, rub- none.            Chest wall-  Abd-  Br/ Gen/ Rectal- Not done, not indicated Extrem- cyanosis- none, clubbing, none, atrophy- none, strength- nl Neuro- grossly intact to observation  02/05/2019- Virtual Visit via Telephone Note  History of Present Illness: 86 year old male former smoker (60 pack year) followed for emphysema, chronic loculated right  pneumothorax, complicated by BPH, PAD/ ASCVD Pro-air HFA, Stiolto Respimat 2.5, theophylline 200 mg twice daily O2 2L sleep and prn/, APS -----breathing at baseline, on O2 2L, using albuterol and Stiolto (needs refill, by mail from local pharmacy He denies changes or acute episodes.   Observations/Objective:   Assessment and Plan:   Follow Up Instructions:

## 2020-02-05 NOTE — Patient Instructions (Signed)
Order- CXR  Dx  COPD mixed type  Ok to continue present meds  If sleep concerns get more intrusive, please let me know   Please cal if we can help

## 2020-02-06 DIAGNOSIS — M545 Low back pain: Secondary | ICD-10-CM | POA: Diagnosis not present

## 2020-02-06 DIAGNOSIS — R269 Unspecified abnormalities of gait and mobility: Secondary | ICD-10-CM | POA: Diagnosis not present

## 2020-02-09 NOTE — Progress Notes (Signed)
Patient contacted with results of recent CRX. Patient verbalized understanding of results.

## 2020-02-20 ENCOUNTER — Telehealth: Payer: Self-pay

## 2020-02-20 DIAGNOSIS — M545 Low back pain: Secondary | ICD-10-CM | POA: Diagnosis not present

## 2020-02-20 DIAGNOSIS — R269 Unspecified abnormalities of gait and mobility: Secondary | ICD-10-CM | POA: Diagnosis not present

## 2020-02-20 NOTE — Telephone Encounter (Signed)
Unable to leave message

## 2020-02-20 NOTE — Telephone Encounter (Signed)
Patient's wife called patient is having knee pain, hip and ankle pain he can barely walk and he would like to speak with you or get an appointment today or Monday.   Call back number [phone]

## 2020-02-20 NOTE — Telephone Encounter (Signed)
See today.  Will need uric acid and sed rate.

## 2020-02-22 DIAGNOSIS — J449 Chronic obstructive pulmonary disease, unspecified: Secondary | ICD-10-CM | POA: Diagnosis not present

## 2020-02-25 ENCOUNTER — Other Ambulatory Visit: Payer: Self-pay | Admitting: Internal Medicine

## 2020-02-25 NOTE — Telephone Encounter (Signed)
[name] called back today to say that the Physcal Therapist at [facility] suggested that Jeffrey Meadows see Dr Metta Clines for Neurological issues since he is having balance problems and his left knee is collapsing and having lot of pain with left knee.

## 2020-02-26 DIAGNOSIS — M545 Low back pain: Secondary | ICD-10-CM | POA: Diagnosis not present

## 2020-02-26 DIAGNOSIS — R269 Unspecified abnormalities of gait and mobility: Secondary | ICD-10-CM | POA: Diagnosis not present

## 2020-03-02 ENCOUNTER — Ambulatory Visit: Payer: Medicare PPO | Admitting: Internal Medicine

## 2020-03-02 ENCOUNTER — Other Ambulatory Visit: Payer: Self-pay

## 2020-03-02 ENCOUNTER — Encounter: Payer: Self-pay | Admitting: Internal Medicine

## 2020-03-02 VITALS — BP 130/70 | HR 70 | Ht 66.5 in | Wt 149.0 lb

## 2020-03-02 DIAGNOSIS — R2689 Other abnormalities of gait and mobility: Secondary | ICD-10-CM | POA: Diagnosis not present

## 2020-03-02 DIAGNOSIS — G609 Hereditary and idiopathic neuropathy, unspecified: Secondary | ICD-10-CM

## 2020-03-03 DIAGNOSIS — M545 Low back pain: Secondary | ICD-10-CM | POA: Diagnosis not present

## 2020-03-03 DIAGNOSIS — R269 Unspecified abnormalities of gait and mobility: Secondary | ICD-10-CM | POA: Diagnosis not present

## 2020-03-03 LAB — FOLATE: Folate: 22.1 ng/mL

## 2020-03-03 LAB — VITAMIN B12: Vitamin B-12: 464 pg/mL (ref 200–1100)

## 2020-03-03 LAB — T4, FREE: Free T4: 1.6 ng/dL (ref 0.8–1.8)

## 2020-03-03 LAB — TSH: TSH: 1.49 mIU/L (ref 0.40–4.50)

## 2020-03-07 ENCOUNTER — Other Ambulatory Visit: Payer: Self-pay | Admitting: Internal Medicine

## 2020-03-19 DIAGNOSIS — R269 Unspecified abnormalities of gait and mobility: Secondary | ICD-10-CM | POA: Diagnosis not present

## 2020-03-19 DIAGNOSIS — M545 Low back pain: Secondary | ICD-10-CM | POA: Diagnosis not present

## 2020-03-24 DIAGNOSIS — J449 Chronic obstructive pulmonary disease, unspecified: Secondary | ICD-10-CM | POA: Diagnosis not present

## 2020-03-25 DIAGNOSIS — R269 Unspecified abnormalities of gait and mobility: Secondary | ICD-10-CM | POA: Diagnosis not present

## 2020-03-25 DIAGNOSIS — M545 Low back pain: Secondary | ICD-10-CM | POA: Diagnosis not present
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
## 2020-04-03 NOTE — Patient Instructions (Signed)
It was a great pleasure to see you today.  Lab studies are normal including B12, folate and thyroid functions.  I think balance issues are a result of age and osteoarthritis.  I do not think you have peripheral neuropathy.

## 2020-04-06 ENCOUNTER — Ambulatory Visit: Payer: Medicare PPO | Admitting: Sports Medicine

## 2020-04-06 ENCOUNTER — Other Ambulatory Visit: Payer: Self-pay

## 2020-04-06 DIAGNOSIS — M25531 Pain in right wrist: Secondary | ICD-10-CM

## 2020-04-06 DIAGNOSIS — G8929 Other chronic pain: Secondary | ICD-10-CM | POA: Diagnosis not present

## 2020-04-06 NOTE — Patient Instructions (Signed)
Right wrist  You have arthritis and a calcified loose body that blocks full movement of the right wrist Be careful with exercises as they can irritate this Use a wrist support Ice or heat is OK  Try using Voltaren arthritis cream 2 to 4 times a day when hurting  For PT You have some generalized uarthritis so be careful with strength exercises that seem painful or too hard Most important aspect of PT is to keep your full motion around as many joints as possible  Keep up some activity most days

## 2020-04-06 NOTE — Assessment & Plan Note (Signed)
Wrist loop support provided  Voltaren gel bid to qid  ICE or heat if helpful  Be careful with his exercises and with too much lifting with right hand Reck if not improving

## 2020-04-06 NOTE — Progress Notes (Signed)
Chief complaint right wrist pain  Patient has noted for the past 1 month that his right wrist is stiffer.  This does not move as well and become somewhat painful if he uses the wrist too much. He questions if he is having some swelling. He does not recall a specific injury but has been working out at the physical therapy office to maintain strength and fitness He states that some of the physical therapy exercises are moderately painful  He has other chronic joint issues but none bothering him too much today  Review of systems No numbness in the right hand No redness or warmth Possible mild swelling  Physical exam Pleasant older male in no acute distress BP 128/60   Ht 5\' 7"  (1.702 m)   Wt 150 lb (68 kg)   BMI 23.49 kg/m   Right wrist does look slightly puffy He lacks 10 degrees of extension compared to the left He gets mild pain with too much ulnar or radial deviation Flexion is limited by about 10 to 15 degrees compared to left  Ultrasound of the right wrist There is no significant joint effusion In the radiocarpal joint there is a small loose body There is some slight swelling around the TFCC No spurring is noted Incidentally the right CMC joint shows mild arthritic change  Impression mild arthritis of the radiocarpal joint with a possible loose body  Ultrasound and interpretation by Wolfgang Phoenix. Oneida Alar, MD

## 2020-04-07 ENCOUNTER — Ambulatory Visit (INDEPENDENT_AMBULATORY_CARE_PROVIDER_SITE_OTHER): Payer: Medicare PPO | Admitting: Podiatry

## 2020-04-07 ENCOUNTER — Encounter: Payer: Self-pay | Admitting: Podiatry

## 2020-04-07 DIAGNOSIS — M79675 Pain in left toe(s): Secondary | ICD-10-CM | POA: Diagnosis not present

## 2020-04-07 DIAGNOSIS — I739 Peripheral vascular disease, unspecified: Secondary | ICD-10-CM

## 2020-04-07 DIAGNOSIS — B351 Tinea unguium: Secondary | ICD-10-CM | POA: Diagnosis not present

## 2020-04-07 DIAGNOSIS — M79674 Pain in right toe(s): Secondary | ICD-10-CM | POA: Diagnosis not present

## 2020-04-11 NOTE — Progress Notes (Signed)
Subjective: Jeffrey Meadows is a 86 y.o. male patient seen today for at risk foot care. Patient has h/o PAD and painful mycotic nails b/l that are difficult to trim. Pain interferes with ambulation. Aggravating factors include wearing enclosed shoe gear. Pain is relieved with periodic professional debridement.  His wife is present during today's visit. They voice no new pedal concerns on today's visit.  Patient Active Problem List   Diagnosis Date Noted  . Chronic pain of right wrist 04/06/2020  . Knee instability 10/28/2019  . Chronic respiratory failure with hypoxia ([place]) 10/02/2017  . Acute midline low back pain without sciatica 06/29/2016  . Chronic alcoholism ([place]) 06/29/2016  . Osteoarthritis of cervical spine 03/01/2016  . Strain of left trapezius muscle 10/21/2015  . Peripheral vascular disease, unspecified ([place]) 12/02/2013  . Essential tremor 10/14/2013  . Hoarseness of voice 04/12/2013  . Nasal septal deviation 07/16/2012  . Chronic foot pain, left 05/09/2012  . Claudication in peripheral vascular disease ([place]) 11/21/2011  . Constipation 03/09/2011  . Hearing impairment 03/09/2011  . Allergic rhinitis 02/02/2011  . Hip pain, chronic 06/08/2010  . Tobacco use disorder, severe, in sustained remission 03/18/2010  . BPH with obstruction/lower urinary tract symptoms 06/17/2008  . ANOMALY, CONGENITAL HYPOSPADIAS 04/23/2007  . Hyperlipidemia 10/11/2006  . HYPERTENSION, BENIGN SYSTEMIC 10/11/2006  . HEMORRHOIDS, NOS 10/11/2006  . COPD with emphysema ([place]) 10/11/2006  . PSORIASIS 10/11/2006  . GAIT, ABNORMAL 10/11/2006    Current Outpatient Medications on File Prior to Visit  Medication Sig Dispense Refill  . naproxen (NAPROSYN) 500 MG tablet TAKE 1 TABLET BY MOUTH TWICE A DAY WITH FOOD 180 tablet 1  . albuterol (PROAIR HFA) 108 (90 Base) MCG/ACT inhaler Inhale 1-2 puffs into the lungs every 4 (four) hours as needed. as needed shortness of breath prior to exercise (Patient  taking differently: Inhale 1-2 puffs into the lungs every 4 (four) hours as needed for wheezing or shortness of breath (shortness of breath prior to exercise). ) 54 g 3  . AMBULATORY NON FORMULARY MEDICATION Ketoprofen 20% cream 1 Tube 2  . amLODipine (NORVASC) 10 MG tablet TAKE 1 TABLET BY MOUTH EVERY DAY 90 tablet 1  . atorvastatin (LIPITOR) 20 MG tablet Take 1 tablet (20 mg total) by mouth daily. 90 tablet 3  . calcipotriene (DOVONOX) 0.005 % ointment Apply 1 application topically daily. Applies to affected area.  3  . Cholecalciferol (VITAMIN D) 2000 UNITS CAPS Take 2,000 Units by mouth 2 (two) times a week.     . diphenoxylate-atropine (LOMOTIL) 2.5-0.025 MG tablet Take 1 tablet by mouth every 12 (twelve) hours as needed for diarrhea or loose stools. 30 tablet 0  . docusate sodium (COLACE) 100 MG capsule Take 100 mg by mouth daily.    . finasteride (PROSCAR) 5 MG tablet Take 5 mg by mouth daily.  3  . hydrochlorothiazide (HYDRODIURIL) 25 MG tablet TAKE 1 TABLET BY MOUTH EVERY DAY IN THE MORNING 90 tablet 3  . polyethylene glycol powder (GLYCOLAX/MIRALAX) powder Take 17 g by mouth 2 (two) times daily as needed. 3350 g 11  . STIOLTO RESPIMAT 2.5-2.5 MCG/ACT AERS INHALE 2 PUFFS BY MOUTH INTO THE LUNGS DAILY 4 g 3  . tamsulosin (FLOMAX) 0.4 MG CAPS capsule Take 0.8 mg by mouth daily.   11  . theophylline (THEODUR) 200 MG 12 hr tablet Take 1 tablet (200 mg total) by mouth 2 (two) times daily. With meals 60 tablet 12  . traMADol (ULTRAM) 50 MG tablet One po  with food once or twice daily as needed for chronic osteoarthritis pain 30 tablet 0   No current facility-administered medications on file prior to visit.    Allergies  Allergen Reactions  . Antihistamines, Diphenhydramine-Type Other (See Comments)    Enlarged prostate    Objective: Physical Exam  General: 86 y.o. Caucasian male, WD, WN in NAD. AAO x 3.  Neurovascular status unchanged b/l.  Capillary refill time to digits immediate  b/l. DP pulse nonpalpable left foot. DP pulse palpable right foot. PT pulse faintly palpable left foot. PT pulse nonpalpable right foot. Pedal hair present. Lower extremity skin temperature gradient within normal limits.  Protective sensation intact 5/5 intact bilaterally with 10g monofilament b/l. Vibratory sensation intact b/l.  Dermatological:  Pedal skin with normal turgor, texture and tone bilaterally. No open wounds bilaterally. No interdigital macerations bilaterally. Toenails 1-5 b/l elongated, discolored, dystrophic, thickened, crumbly with subungual debris and tenderness to dorsal palpation.  Musculoskeletal:  Normal muscle strength 5/5 to all lower extremity muscle groups bilaterally. No gross bony deformities bilaterally. No pain crepitus or joint limitation noted with ROM b/l.  Assessment and Plan:  1. Pain due to onychomycosis of toenails of both feet   2. PAD (peripheral artery disease) ([place])    -Examined patient. -No new findings. No new orders. -Toenails 1-5 b/l were debrided in length and girth with sterile nail nippers and dremel without iatrogenic bleeding.  -Patient to continue soft, supportive shoe gear daily. -Patient to report any pedal injuries to medical professional immediately. -Patient/POA to call should there be question/concern in the interim.  Return in about 3 months (around 07/08/2020) for diabetic nail and callus trim.  Marzetta Board, DPM

## 2020-04-13 DIAGNOSIS — R269 Unspecified abnormalities of gait and mobility: Secondary | ICD-10-CM | POA: Diagnosis not present

## 2020-04-13 DIAGNOSIS — M545 Low back pain: Secondary | ICD-10-CM | POA: Diagnosis not present

## 2020-04-24 DIAGNOSIS — J449 Chronic obstructive pulmonary disease, unspecified: Secondary | ICD-10-CM | POA: Diagnosis not present

## 2020-04-27 DIAGNOSIS — R972 Elevated prostate specific antigen [PSA]: Secondary | ICD-10-CM | POA: Diagnosis not present

## 2020-04-28 ENCOUNTER — Encounter: Payer: Self-pay | Admitting: Internal Medicine

## 2020-04-28 ENCOUNTER — Ambulatory Visit (INDEPENDENT_AMBULATORY_CARE_PROVIDER_SITE_OTHER): Payer: Medicare PPO | Admitting: Internal Medicine

## 2020-04-28 ENCOUNTER — Other Ambulatory Visit: Payer: Self-pay

## 2020-04-28 VITALS — BP 120/80 | HR 80 | Temp 98.1°F

## 2020-04-28 DIAGNOSIS — Z23 Encounter for immunization: Secondary | ICD-10-CM

## 2020-04-28 NOTE — Patient Instructions (Addendum)
Patient received a flu shot IM, L deltoid. 

## 2020-04-28 NOTE — Progress Notes (Signed)
Flu vaccine given by CMA 

## 2020-04-29 DIAGNOSIS — M545 Low back pain: Secondary | ICD-10-CM | POA: Diagnosis not present

## 2020-04-29 DIAGNOSIS — R269 Unspecified abnormalities of gait and mobility: Secondary | ICD-10-CM | POA: Diagnosis not present

## 2020-05-02 NOTE — Assessment & Plan Note (Signed)
NOw working with PT on balance problems.

## 2020-05-02 NOTE — Assessment & Plan Note (Signed)
Severe but clincally stable. Meds appropriate.  Plan- refill as needed, CXR

## 2020-05-03 DIAGNOSIS — R350 Frequency of micturition: Secondary | ICD-10-CM | POA: Diagnosis not present

## 2020-05-03 DIAGNOSIS — N401 Enlarged prostate with lower urinary tract symptoms: Secondary | ICD-10-CM | POA: Diagnosis not present

## 2020-05-03 DIAGNOSIS — R972 Elevated prostate specific antigen [PSA]: Secondary | ICD-10-CM | POA: Diagnosis not present

## 2020-05-07 DIAGNOSIS — R269 Unspecified abnormalities of gait and mobility: Secondary | ICD-10-CM | POA: Diagnosis not present

## 2020-05-07 DIAGNOSIS — M545 Low back pain: Secondary | ICD-10-CM | POA: Diagnosis not present

## 2020-05-12 DIAGNOSIS — M545 Low back pain: Secondary | ICD-10-CM | POA: Diagnosis not present

## 2020-05-12 DIAGNOSIS — R269 Unspecified abnormalities of gait and mobility: Secondary | ICD-10-CM | POA: Diagnosis not present

## 2020-05-24 DIAGNOSIS — J449 Chronic obstructive pulmonary disease, unspecified: Secondary | ICD-10-CM | POA: Diagnosis not present

## 2020-05-26 DIAGNOSIS — R269 Unspecified abnormalities of gait and mobility: Secondary | ICD-10-CM | POA: Diagnosis not present

## 2020-06-01 ENCOUNTER — Other Ambulatory Visit: Payer: Medicare PPO | Admitting: Internal Medicine

## 2020-06-01 DIAGNOSIS — Z13220 Encounter for screening for lipoid disorders: Secondary | ICD-10-CM

## 2020-06-01 DIAGNOSIS — G8929 Other chronic pain: Secondary | ICD-10-CM

## 2020-06-01 DIAGNOSIS — R351 Nocturia: Secondary | ICD-10-CM

## 2020-06-01 DIAGNOSIS — Z0000 Encounter for general adult medical examination without abnormal findings: Secondary | ICD-10-CM

## 2020-06-01 DIAGNOSIS — M159 Polyosteoarthritis, unspecified: Secondary | ICD-10-CM

## 2020-06-01 DIAGNOSIS — J449 Chronic obstructive pulmonary disease, unspecified: Secondary | ICD-10-CM

## 2020-06-03 ENCOUNTER — Other Ambulatory Visit: Payer: Medicare PPO | Admitting: Internal Medicine

## 2020-06-03 ENCOUNTER — Other Ambulatory Visit: Payer: Self-pay

## 2020-06-03 DIAGNOSIS — G8929 Other chronic pain: Secondary | ICD-10-CM | POA: Diagnosis not present

## 2020-06-03 DIAGNOSIS — R351 Nocturia: Secondary | ICD-10-CM | POA: Diagnosis not present

## 2020-06-03 DIAGNOSIS — M25552 Pain in left hip: Secondary | ICD-10-CM | POA: Diagnosis not present

## 2020-06-03 DIAGNOSIS — N401 Enlarged prostate with lower urinary tract symptoms: Secondary | ICD-10-CM | POA: Diagnosis not present

## 2020-06-03 DIAGNOSIS — J449 Chronic obstructive pulmonary disease, unspecified: Secondary | ICD-10-CM | POA: Diagnosis not present

## 2020-06-03 DIAGNOSIS — M159 Polyosteoarthritis, unspecified: Secondary | ICD-10-CM | POA: Diagnosis not present

## 2020-06-03 DIAGNOSIS — I10 Essential (primary) hypertension: Secondary | ICD-10-CM | POA: Diagnosis not present

## 2020-06-03 DIAGNOSIS — Z0000 Encounter for general adult medical examination without abnormal findings: Secondary | ICD-10-CM | POA: Diagnosis not present

## 2020-06-03 DIAGNOSIS — I499 Cardiac arrhythmia, unspecified: Secondary | ICD-10-CM | POA: Diagnosis not present

## 2020-06-04 ENCOUNTER — Encounter: Payer: Self-pay | Admitting: Internal Medicine

## 2020-06-04 ENCOUNTER — Other Ambulatory Visit: Payer: Self-pay

## 2020-06-04 ENCOUNTER — Ambulatory Visit (INDEPENDENT_AMBULATORY_CARE_PROVIDER_SITE_OTHER): Payer: Medicare PPO | Admitting: Internal Medicine

## 2020-06-04 VITALS — BP 130/80 | HR 78 | Ht 67.0 in | Wt 149.0 lb

## 2020-06-04 DIAGNOSIS — N401 Enlarged prostate with lower urinary tract symptoms: Secondary | ICD-10-CM

## 2020-06-04 DIAGNOSIS — Z0000 Encounter for general adult medical examination without abnormal findings: Secondary | ICD-10-CM

## 2020-06-04 DIAGNOSIS — I499 Cardiac arrhythmia, unspecified: Secondary | ICD-10-CM

## 2020-06-04 DIAGNOSIS — I10 Essential (primary) hypertension: Secondary | ICD-10-CM

## 2020-06-04 DIAGNOSIS — G8929 Other chronic pain: Secondary | ICD-10-CM

## 2020-06-04 DIAGNOSIS — G609 Hereditary and idiopathic neuropathy, unspecified: Secondary | ICD-10-CM

## 2020-06-04 DIAGNOSIS — J449 Chronic obstructive pulmonary disease, unspecified: Secondary | ICD-10-CM

## 2020-06-04 DIAGNOSIS — Z8679 Personal history of other diseases of the circulatory system: Secondary | ICD-10-CM

## 2020-06-04 LAB — CBC WITH DIFFERENTIAL/PLATELET
Absolute Monocytes: 888 cells/uL (ref 200–950)
Basophils Absolute: 80 cells/uL (ref 0–200)
Basophils Relative: 1 %
Eosinophils Absolute: 280 cells/uL (ref 15–500)
Eosinophils Relative: 3.5 %
HCT: 43.6 % (ref 38.5–50.0)
Hemoglobin: 15 g/dL (ref 13.2–17.1)
Lymphs Abs: 2152 cells/uL (ref 850–3900)
MCH: 32.3 pg (ref 27.0–33.0)
MCHC: 34.4 g/dL (ref 32.0–36.0)
MCV: 93.8 fL (ref 80.0–100.0)
MPV: 9.5 fL (ref 7.5–12.5)
Monocytes Relative: 11.1 %
Neutro Abs: 4600 cells/uL (ref 1500–7800)
Neutrophils Relative %: 57.5 %
Platelets: 284 10*3/uL (ref 140–400)
RBC: 4.65 10*6/uL (ref 4.20–5.80)
RDW: 12.5 % (ref 11.0–15.0)
Total Lymphocyte: 26.9 %
WBC: 8 10*3/uL (ref 3.8–10.8)

## 2020-06-04 LAB — COMPLETE METABOLIC PANEL WITH GFR
AG Ratio: 1.7 (calc) (ref 1.0–2.5)
ALT: 16 U/L (ref 9–46)
AST: 19 U/L (ref 10–35)
Albumin: 3.7 g/dL (ref 3.6–5.1)
Alkaline phosphatase (APISO): 50 U/L (ref 35–144)
BUN: 13 mg/dL (ref 7–25)
CO2: 31 mmol/L (ref 20–32)
Calcium: 9.6 mg/dL (ref 8.6–10.3)
Chloride: 97 mmol/L — ABNORMAL LOW (ref 98–110)
Creat: 0.78 mg/dL (ref 0.70–1.11)
GFR, Est African American: 95 mL/min/{1.73_m2} (ref >=60)
GFR, Est Non African American: 82 mL/min/{1.73_m2} (ref >=60)
Globulin: 2.2 g/dL (calc) (ref 1.9–3.7)
Glucose, Bld: 89 mg/dL (ref 65–99)
Potassium: 3.6 mmol/L (ref 3.5–5.3)
Sodium: 136 mmol/L (ref 135–146)
Total Bilirubin: 0.8 mg/dL (ref 0.2–1.2)
Total Protein: 5.9 g/dL — ABNORMAL LOW (ref 6.1–8.1)

## 2020-06-04 LAB — LIPID PANEL
Cholesterol: 148 mg/dL (ref <200)
HDL: 77 mg/dL (ref >=40)
LDL Cholesterol (Calc): 57 mg/dL (calc)
Non-HDL Cholesterol (Calc): 71 mg/dL (calc) (ref <130)
Total CHOL/HDL Ratio: 1.9 (calc) (ref <5.0)
Triglycerides: 64 mg/dL (ref <150)

## 2020-06-04 LAB — PSA: PSA: 0.96 ng/mL (ref <=4.0)
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
## 2020-06-04 NOTE — Patient Instructions (Signed)
It was a pleasure to see you and your wife today.  We are making referral to see [facility] cardiology for evaluation of irregular heartbeat.  24-hour Holter monitor has been ordered.  Immunizations are up-to-date.  Return in 1 year or as needed.  No change in current medications.  We will order a Cologuard test.

## 2020-06-07 ENCOUNTER — Other Ambulatory Visit: Payer: Self-pay

## 2020-06-07 DIAGNOSIS — Z1211 Encounter for screening for malignant neoplasm of colon: Secondary | ICD-10-CM

## 2020-06-07 LAB — POCT URINALYSIS DIPSTICK
Appearance: NEGATIVE
Bilirubin, UA: NEGATIVE
Blood, UA: NEGATIVE
Glucose, UA: NEGATIVE
Ketones, UA: NEGATIVE
Leukocytes, UA: NEGATIVE
Nitrite, UA: NEGATIVE
Odor: NEGATIVE
Protein, UA: NEGATIVE
Spec Grav, UA: 1.01 (ref 1.010–1.025)
Urobilinogen, UA: 0.2 E.U./dL
pH, UA: 6.5 (ref 5.0–8.0)

## 2020-06-08 ENCOUNTER — Encounter: Payer: Self-pay | Admitting: *Deleted

## 2020-06-08 ENCOUNTER — Telehealth: Payer: Self-pay | Admitting: Internal Medicine

## 2020-06-08 ENCOUNTER — Telehealth: Payer: Self-pay | Admitting: *Deleted

## 2020-06-08 MED ORDER — ONDANSETRON HCL 4 MG PO TABS: 4.0000 mg | ORAL_TABLET | Freq: Three times a day (TID) | ORAL | 0 refills | Status: DC | PRN

## 2020-06-08 NOTE — Telephone Encounter (Signed)
Patient enrolled for [employer] to ship a 3 day ZIO XT long term holter monitor to his home.  Letter with instructions mailed to patient.

## 2020-06-08 NOTE — Telephone Encounter (Signed)
Returned wife's phone call. Apparently has had nausea and diarrhea since Saturday evening. Started while attending silent movie at [employer] on Saturday night. Has "brain fog " , nausea and diarrhea. No documented fever. Needs to check temp.Advise clear liquids,saltine crackers, and advance diet slowly. No abdominal pain. Call if not improving. Calling on Zofran for nausea.

## 2020-06-08 NOTE — Telephone Encounter (Signed)
Jeffrey Meadows [phone]  [name] called to say that Zakary has an upset stomach, brain fog, dizziness, nausea, and real bad diarrhea.

## 2020-06-08 NOTE — Telephone Encounter (Signed)
I tried to call and got no answer. Would like to speak with patient/wife by phone.

## 2020-06-09 DIAGNOSIS — M25431 Effusion, right wrist: Secondary | ICD-10-CM | POA: Diagnosis not present

## 2020-06-09 DIAGNOSIS — R269 Unspecified abnormalities of gait and mobility: Secondary | ICD-10-CM | POA: Diagnosis not present

## 2020-06-09 DIAGNOSIS — M25531 Pain in right wrist: Secondary | ICD-10-CM | POA: Diagnosis not present

## 2020-06-14 ENCOUNTER — Ambulatory Visit (INDEPENDENT_AMBULATORY_CARE_PROVIDER_SITE_OTHER): Payer: Medicare PPO

## 2020-06-14 DIAGNOSIS — I4891 Unspecified atrial fibrillation: Secondary | ICD-10-CM

## 2020-06-14 DIAGNOSIS — I499 Cardiac arrhythmia, unspecified: Secondary | ICD-10-CM

## 2020-06-14 DIAGNOSIS — I455 Other specified heart block: Secondary | ICD-10-CM

## 2020-06-20 NOTE — Progress Notes (Signed)
Cardiology Office Note:    Date:  06/23/2020   ID:  Jeffrey Meadows, DOB 09-25-1932, MRN [id number]  PCP:  [name], MD  Cardiologist:  No primary care provider on file.  Electrophysiologist:  None   Referring MD: [name], MD   Chief Complaint  Patient presents with  . Irregular Heart Beat    History of Present Illness:    Jeffrey Meadows is a 86 y.o. male with a hx of hypertension, hyperlipidemia, Raynaud's disease, BPH, COPD, PAD who is referred by Dr. Renold Genta for evaluation of irregular heart rhythm.  He reports that he has been doing well.  Denies any palpitations or chest pain.  Does report that he gets short of breath, particular with climbing up stairs.  States that he exercises with bicycle 15 to 20 minutes, in addition to doing push-ups and sit ups, most days of the week.  Denies any symptoms with this.  Does report he becomes lightheaded if standing too quickly.  Denies any syncope.  Smoked 1.5 ppd x 40 years, quit in mid 59s.  Father died of MI in late 60s/early 15s.    Past Medical History:  Diagnosis Date  . Adenomatous polyp of colon 08/2001   removed hepatic flexure  . Arthritis   . Cataract   . Chronic back pain    stenosis/scoliosis/synovial cyst  . Enlarged prostate    takes Proscar daily  . History of blood transfusion    no abnormal reaction noted  . Hyperlipidemia    takes Atorvastatin daily  . Hypertension    takes Amlodipine and HCTZ daily  . Joint pain   . On home oxygen therapy    at night  . Raynaud disease   . Urinary frequency    takes Flomax daily  . Villous adenoma of colon 11/.2001   2 cm removed cecum    Past Surgical History:  Procedure Laterality Date  . ABDOMINAL AORTOGRAM N/A 08/15/2017   Procedure: ABDOMINAL AORTOGRAM;  Surgeon: Waynetta Sandy, MD;  Location: [place] [facility];  Service: Cardiovascular;  Laterality: N/A;  . cataract surgery    . CYSTOSCOPY WITH LITHOLAPAXY N/A 01/11/2016   Procedure:  CYSTOSCOPY WITH LITHOLAPAXY;  Surgeon: Festus Aloe, MD;  [facility];  Service: Urology;  Laterality: N/A;  . GREEN LIGHT LASER TURP (TRANSURETHRAL RESECTION OF PROSTATE N/A 01/11/2016   Procedure: GREEN LIGHT LASER TURP (TRANSURETHRAL RESECTION OF PROSTATE;  Surgeon: Festus Aloe, MD;  [facility];  Service: Urology;  Laterality: N/A;  . HERNIA REPAIR Left    inguinal  . HOLMIUM LASER APPLICATION N/A 2/77/8242   Procedure: HOLMIUM LASER APPLICATION;  Surgeon: Festus Aloe, MD;  [facility];  Service: Urology;  Laterality: N/A;  . JOINT REPLACEMENT     Right Hip  . LAMINECTOMY Right 07/02/2014   Procedure: Right Lumbar two-three Laminectomy for Synovial Cyst;  Surgeon: Erline Levine, MD;  Location: [place] [facility];  Service: Neurosurgery;  Laterality: Right;  . LOWER EXTREMITY ANGIOGRAPHY Bilateral 08/15/2017   Procedure: LOWER EXTREMITY ANGIOGRAPHY;  Surgeon: Waynetta Sandy, MD;  Location: [facility] [facility];  Service: Cardiovascular;  Laterality: Bilateral;  . POPLITEAL VENOUS ANEURYSM REPAIR Left 2010   about 6 yrs ago  . PROSTATE BIOPSY  4/95  . SPINE SURGERY  Jan. 2016   Dr. Vickie Epley  . TONSILLECTOMY    . TOTAL HIP ARTHROPLASTY  1952   right    Current Medications: Current Meds  Medication Sig  .  albuterol (PROAIR HFA) 108 (90 Base) MCG/ACT inhaler Inhale 1-2 puffs into the lungs every 4 (four) hours as needed. as needed shortness of breath prior to exercise (Patient taking differently: Inhale 1-2 puffs into the lungs every 4 (four) hours as needed for wheezing or shortness of breath (shortness of breath prior to exercise). )  . AMBULATORY NON FORMULARY MEDICATION Ketoprofen 20% cream  . amLODipine (NORVASC) 10 MG tablet TAKE 1 TABLET BY MOUTH EVERY DAY  . atorvastatin (LIPITOR) 20 MG tablet Take 1 tablet (20 mg total) by mouth daily.  . calcipotriene (DOVONOX) 0.005 % ointment Apply 1 application topically daily. Applies to affected area.  .  Cholecalciferol (VITAMIN D) 2000 UNITS CAPS Take 2,000 Units by mouth 2 (two) times a week.   . diphenoxylate-atropine (LOMOTIL) 2.5-0.025 MG tablet Take 1 tablet by mouth every 12 (twelve) hours as needed for diarrhea or loose stools.  . docusate sodium (COLACE) 100 MG capsule Take 100 mg by mouth daily.  . finasteride (PROSCAR) 5 MG tablet Take 5 mg by mouth daily.  . hydrochlorothiazide (HYDRODIURIL) 25 MG tablet TAKE 1 TABLET BY MOUTH EVERY DAY IN THE MORNING  . naproxen (NAPROSYN) 500 MG tablet TAKE 1 TABLET BY MOUTH TWICE A DAY WITH FOOD  . ondansetron (ZOFRAN) 4 MG tablet Take 1 tablet (4 mg total) by mouth every 8 (eight) hours as needed for nausea or vomiting.  . polyethylene glycol powder (GLYCOLAX/MIRALAX) powder Take 17 g by mouth 2 (two) times daily as needed.  Marland Kitchen STIOLTO RESPIMAT 2.5-2.5 MCG/ACT AERS INHALE 2 PUFFS BY MOUTH INTO THE LUNGS DAILY  . tamsulosin (FLOMAX) 0.4 MG CAPS capsule Take 0.8 mg by mouth daily.   . traMADol (ULTRAM) 50 MG tablet One po with food once or twice daily as needed for chronic osteoarthritis pain     Allergies:   Antihistamines, diphenhydramine-type   Social History   Socioeconomic History  . Marital status: Married    Spouse name: [name]  . Number of children: 1  . Years of education: 42  . Highest education level: Not on file  Occupational History  . 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 Smoking status: Former Smoker    Packs/day: 1.50    Years: 40.00    Pack years: 60.00    Types: Cigarettes    Quit date: 02/04/1989    Years since quitting: 31.4  . Smokeless tobacco: Never Used  . Tobacco comment: quit smoking about 65yrs ago  Substance and Sexual Activity  . Alcohol use: Yes    Comment: 3-5 glasses of wine daily, 12-20 glasses a week  . Drug use: No  . Sexual activity: Not Currently  Other Topics Concern  . 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   Exercise: Pt reports exercising 4 days a week for 60 minutes.   Seatbelts: Pt reports wearing seatbelt when in vehicles.    Sun Exposure/Protection: Pt reports not using sun protection.   Hobbies: reading, writing, music  Social Determinants of Health   Financial Resource Strain:   . Difficulty of Paying Living Expenses: Not on file  Food Insecurity:   . Worried About Charity fundraiser in the Last Year: Not on file  . Ran Out of Food in the Last Year: Not on file  Transportation Needs:   . Lack of Transportation (Medical): Not on file  . Lack of Transportation (Non-Medical): Not on file  Physical Activity:   . Days of Exercise per Week: Not on file  . Minutes of Exercise per Session: Not on file  Stress:   . Feeling of Stress : Not on file  Social Connections:   . Frequency of Communication with Friends and Family: Not on file  . Frequency of Social Gatherings with Friends and Family: Not on file  . Attends Religious Services: Not on file  . Active Member of Clubs or Organizations: Not on file  . Attends Archivist Meetings: Not on file  . Marital Status: Not on file     Family History: The patient's family history includes Aneurysm in his father; Heart disease in his mother; Heart failure in his mother.  ROS:   Please see the history of present illness.     All other systems reviewed and are negative.  EKGs/Labs/Other Studies Reviewed:    The following studies were reviewed today:   EKG:  EKG is ordered today.  The ekg ordered today demonstrates normal sinus rhythm, rate 77, no ST/T abnormalities  Recent Labs: 03/02/2020: TSH 1.49 06/03/2020: ALT 16; BUN 13; Creat 0.78;  Hemoglobin 15.0; Platelets 284; Potassium 3.6; Sodium 136  Recent Lipid Panel    Component Value Date/Time   CHOL 148 06/03/2020 0949   TRIG 64 06/03/2020 0949   HDL 77 06/03/2020 0949   CHOLHDL 1.9 06/03/2020 0949   VLDL 13 03/13/2017 1034   LDLCALC 57 06/03/2020 0949    Physical Exam:    VS:  BP 130/70   Pulse 77   Ht 5\' 7"  (1.702 m)   Wt 150 lb 3.2 oz (68.1 kg)   SpO2 93%   BMI 23.52 kg/m     Wt Readings from Last 3 Encounters:  06/22/20 150 lb 3.2 oz (68.1 kg)  06/04/20 149 lb (67.6 kg)  04/06/20 150 lb (68 kg)     GEN:  Well nourished, well developed in no acute distress HEENT: Normal NECK: No JVD; No carotid bruits LYMPHATICS: No lymphadenopathy CARDIAC: RRR, no murmurs, rubs, gallops RESPIRATORY:  Clear to auscultation without rales, wheezing or rhonchi  ABDOMEN: Soft, non-tender, non-distended MUSCULOSKELETAL:  No edema; No deformity  SKIN: Warm and dry NEUROLOGIC:  Alert and oriented x 3 PSYCHIATRIC:  Normal affect   ASSESSMENT:    1. Irregular heart beat   2. SOB (shortness of breath)   3. Essential hypertension   4. Hyperlipidemia, unspecified hyperlipidemia type   5. PAD (peripheral artery disease) (HCC)    PLAN:    Irregular heart rhythm: Noted at Dr. Verlene Mayer office.  Zio patch x3 days was turned in a few days ago, will follow up results  Dyspnea: Suspect due to COPD, will check echocardiogram to rule out structural heart disease  Hypertension: On amlodipine 10 mg daily and hydrochlorothiazide 25 mg daily.  Appears controlled  Hyperlipidemia: On atorvastatin 20 mg daily.  LDL 57 on 06/03/20  PAD: Status post left SFA to popliteal artery bypass graft in 2006 for aneurysm.  Follows with Dr. Donzetta Matters  RTC in 3 months  Medication Adjustments/Labs and Tests Ordered: Current medicines are reviewed at length with the patient today.  Concerns regarding medicines are outlined above.  Orders Placed This Encounter  Procedures  . EKG 12-Lead  .  ECHOCARDIOGRAM COMPLETE   No orders of the defined types were placed in this encounter.   Patient Instructions  Medication Instructions:  Your physician recommends that you continue on your current medications as directed. Please refer to the Current Medication list given to you today.   Testing/Procedures: Your physician has requested that you have an echocardiogram. Echocardiography is a painless test that uses sound waves to create images of your heart. It provides your doctor with information about the size and shape of your heart and how well your heart's chambers and valves are working. This procedure takes approximately one hour. There are no restrictions for this procedure.  This will be done at our [facility] location:  [place]: At [facility], you and your health needs are our priority.  As part of our continuing mission to provide you with exceptional heart care, we have created designated Provider Care Teams.  These Care Teams include your primary Cardiologist (physician) and Advanced Practice Providers (APPs -  Physician Assistants and Nurse Practitioners) who all work together to provide you with the care you need, when you need it.  We recommend signing up for the patient portal called "MyChart".  Sign up information is provided on this After Visit Summary.  MyChart is used to connect with patients for Virtual Visits (Telemedicine).  Patients are able to view lab/test results, encounter notes, upcoming appointments, etc.  Non-urgent messages can be sent to your provider as well.   To learn more about what you can do with MyChart, go to [email or web address].    Your next appointment:   3 month(s)  The format for your next appointment:   In Person  Provider:   Oswaldo Milian, MD        Signed, Donato Heinz, MD  06/23/2020 11:32 AM    [place]

## 2020-06-21 ENCOUNTER — Other Ambulatory Visit: Payer: Self-pay | Admitting: Internal Medicine

## 2020-06-22 ENCOUNTER — Encounter: Payer: Self-pay | Admitting: Cardiology

## 2020-06-22 ENCOUNTER — Ambulatory Visit: Payer: Medicare PPO | Admitting: Cardiology

## 2020-06-22 VITALS — BP 130/70 | HR 77 | Ht 67.0 in | Wt 150.2 lb

## 2020-06-22 DIAGNOSIS — I739 Peripheral vascular disease, unspecified: Secondary | ICD-10-CM | POA: Diagnosis not present

## 2020-06-22 DIAGNOSIS — I499 Cardiac arrhythmia, unspecified: Secondary | ICD-10-CM | POA: Diagnosis not present

## 2020-06-22 DIAGNOSIS — I10 Essential (primary) hypertension: Secondary | ICD-10-CM | POA: Diagnosis not present

## 2020-06-22 DIAGNOSIS — E785 Hyperlipidemia, unspecified: Secondary | ICD-10-CM

## 2020-06-22 DIAGNOSIS — R0602 Shortness of breath: Secondary | ICD-10-CM

## 2020-06-22 NOTE — Patient Instructions (Signed)
Medication Instructions:  Your physician recommends that you continue on your current medications as directed. Please refer to the Current Medication list given to you today.   Testing/Procedures: Your physician has requested that you have an echocardiogram. Echocardiography is a painless test that uses sound waves to create images of your heart. It provides your doctor with information about the size and shape of your heart and how well your heart's chambers and valves are working. This procedure takes approximately one hour. There are no restrictions for this procedure.  This will be done at our [facility] location:  [place]: At [facility], you and your health needs are our priority.  As part of our continuing mission to provide you with exceptional heart care, we have created designated Provider Care Teams.  These Care Teams include your primary Cardiologist (physician) and Advanced Practice Providers (APPs -  Physician Assistants and Nurse Practitioners) who all work together to provide you with the care you need, when you need it.  We recommend signing up for the patient portal called "MyChart".  Sign up information is provided on this After Visit Summary.  MyChart is used to connect with patients for Virtual Visits (Telemedicine).  Patients are able to view lab/test results, encounter notes, upcoming appointments, etc.  Non-urgent messages can be sent to your provider as well.   To learn more about what you can do with MyChart, go to [email or web address].    Your next appointment:   3 month(s)  The format for your next appointment:   In Person  Provider:   Oswaldo Milian, MD

## 2020-06-23 DIAGNOSIS — M25431 Effusion, right wrist: Secondary | ICD-10-CM | POA: Diagnosis not present

## 2020-06-23 DIAGNOSIS — M25531 Pain in right wrist: Secondary | ICD-10-CM | POA: Diagnosis not present

## 2020-06-23 DIAGNOSIS — R269 Unspecified abnormalities of gait and mobility: Secondary | ICD-10-CM | POA: Diagnosis not present

## 2020-06-24 ENCOUNTER — Other Ambulatory Visit: Payer: Self-pay | Admitting: Internal Medicine

## 2020-06-24 DIAGNOSIS — J449 Chronic obstructive pulmonary disease, unspecified: Secondary | ICD-10-CM | POA: Diagnosis not present

## 2020-06-24 DIAGNOSIS — I491 Atrial premature depolarization: Secondary | ICD-10-CM | POA: Diagnosis not present

## 2020-06-24 DIAGNOSIS — I499 Cardiac arrhythmia, unspecified: Secondary | ICD-10-CM

## 2020-06-24 DIAGNOSIS — I4891 Unspecified atrial fibrillation: Secondary | ICD-10-CM

## 2020-06-27 ENCOUNTER — Other Ambulatory Visit: Payer: Self-pay | Admitting: Internal Medicine

## 2020-06-28 ENCOUNTER — Other Ambulatory Visit: Payer: Self-pay | Admitting: Internal Medicine

## 2020-06-29 DIAGNOSIS — Z1211 Encounter for screening for malignant neoplasm of colon: Secondary | ICD-10-CM | POA: Diagnosis not present

## 2020-07-02 ENCOUNTER — Telehealth: Payer: Self-pay | Admitting: Cardiology

## 2020-07-02 ENCOUNTER — Telehealth: Payer: Self-pay | Admitting: Radiology

## 2020-07-02 DIAGNOSIS — I455 Other specified heart block: Secondary | ICD-10-CM

## 2020-07-02 DIAGNOSIS — I499 Cardiac arrhythmia, unspecified: Secondary | ICD-10-CM

## 2020-07-02 NOTE — Telephone Encounter (Signed)
Patient returning call for monitor results. 

## 2020-07-02 NOTE — Telephone Encounter (Signed)
Enrolled patient for a 14 day Zio XT  monitor to be mailed to patients home  °

## 2020-07-02 NOTE — Telephone Encounter (Signed)
Jeffrey Heinz, MD  06/28/2020 4:16 PM EST     Pause of 2.1 seconds, possible Mobitz II, no symptoms reported. Recommend 14 day Zio patch for further evaluation of bradyarrhythmia     Patient aware and agreed to wear 14 day ZIO.     Order placed.

## 2020-07-07 DIAGNOSIS — R269 Unspecified abnormalities of gait and mobility: Secondary | ICD-10-CM | POA: Diagnosis not present

## 2020-07-07 DIAGNOSIS — M25431 Effusion, right wrist: Secondary | ICD-10-CM | POA: Diagnosis not present

## 2020-07-07 DIAGNOSIS — M25531 Pain in right wrist: Secondary | ICD-10-CM | POA: Diagnosis not present

## 2020-07-16 ENCOUNTER — Ambulatory Visit (HOSPITAL_COMMUNITY): Payer: Medicare PPO | Attending: Cardiovascular Disease

## 2020-07-16 ENCOUNTER — Other Ambulatory Visit: Payer: Self-pay

## 2020-07-16 ENCOUNTER — Telehealth: Payer: Self-pay | Admitting: Internal Medicine

## 2020-07-16 DIAGNOSIS — R0602 Shortness of breath: Secondary | ICD-10-CM | POA: Insufficient documentation

## 2020-07-16 LAB — ECHOCARDIOGRAM COMPLETE
Area-P 1/2: 2.56 cm2
S' Lateral: 3.1 cm

## 2020-07-16 NOTE — Telephone Encounter (Signed)
Pt has had diarrhea for about a month and needs to be seen, when would you like to him?

## 2020-07-16 NOTE — Telephone Encounter (Signed)
Called and left message.

## 2020-07-16 NOTE — Telephone Encounter (Signed)
Next week. Need about 30 min appt

## 2020-07-16 NOTE — Telephone Encounter (Signed)
Patient called back patient can not come in next week he has PT on Monday and Thursday and then wife is taking out some girls for lunch she scheduled an appointment for 12/13.

## 2020-07-19 ENCOUNTER — Encounter: Payer: Self-pay | Admitting: Family Medicine

## 2020-07-19 ENCOUNTER — Ambulatory Visit: Payer: Medicare PPO | Admitting: Family Medicine

## 2020-07-19 ENCOUNTER — Telehealth: Payer: Self-pay

## 2020-07-19 ENCOUNTER — Ambulatory Visit (INDEPENDENT_AMBULATORY_CARE_PROVIDER_SITE_OTHER): Payer: Medicare PPO

## 2020-07-19 ENCOUNTER — Telehealth: Payer: Self-pay | Admitting: Internal Medicine

## 2020-07-19 ENCOUNTER — Encounter: Payer: Self-pay | Admitting: Internal Medicine

## 2020-07-19 DIAGNOSIS — M25551 Pain in right hip: Secondary | ICD-10-CM | POA: Diagnosis not present

## 2020-07-19 NOTE — Telephone Encounter (Signed)
Thanks for the referral!  Ronalee Belts

## 2020-07-19 NOTE — Telephone Encounter (Signed)
Patient's wife called patient can not walk his right hip gave out. She wants to know if he can be seen today.
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
## 2020-07-19 NOTE — Telephone Encounter (Signed)
Have arranged for him to see Dr. Junius Roads at 4 pm today.
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
## 2020-07-20 ENCOUNTER — Ambulatory Visit (INDEPENDENT_AMBULATORY_CARE_PROVIDER_SITE_OTHER): Payer: Medicare PPO

## 2020-07-20 DIAGNOSIS — I499 Cardiac arrhythmia, unspecified: Secondary | ICD-10-CM

## 2020-07-20 DIAGNOSIS — I455 Other specified heart block: Secondary | ICD-10-CM

## 2020-07-21 ENCOUNTER — Ambulatory Visit: Payer: Medicare PPO | Admitting: Podiatry

## 2020-07-21 ENCOUNTER — Other Ambulatory Visit: Payer: Self-pay

## 2020-07-21 ENCOUNTER — Encounter: Payer: Self-pay | Admitting: Podiatry

## 2020-07-21 DIAGNOSIS — L853 Xerosis cutis: Secondary | ICD-10-CM | POA: Diagnosis not present

## 2020-07-21 DIAGNOSIS — B351 Tinea unguium: Secondary | ICD-10-CM | POA: Diagnosis not present

## 2020-07-21 DIAGNOSIS — M79675 Pain in left toe(s): Secondary | ICD-10-CM | POA: Diagnosis not present

## 2020-07-21 DIAGNOSIS — M79674 Pain in right toe(s): Secondary | ICD-10-CM

## 2020-07-21 DIAGNOSIS — M79671 Pain in right foot: Secondary | ICD-10-CM | POA: Diagnosis not present

## 2020-07-21 DIAGNOSIS — M25871 Other specified joint disorders, right ankle and foot: Secondary | ICD-10-CM

## 2020-07-22 ENCOUNTER — Ambulatory Visit: Payer: Medicare PPO | Admitting: Orthotics

## 2020-07-22 DIAGNOSIS — L84 Corns and callosities: Secondary | ICD-10-CM

## 2020-07-22 DIAGNOSIS — M79672 Pain in left foot: Secondary | ICD-10-CM

## 2020-07-22 DIAGNOSIS — I739 Peripheral vascular disease, unspecified: Secondary | ICD-10-CM

## 2020-07-22 NOTE — Progress Notes (Signed)
Took some padding off bottom of Jeffrey Meadows inserts; also gave him DBS insert to try to see if it does better; if so he will get custom accomodative

## 2020-07-24 DIAGNOSIS — J449 Chronic obstructive pulmonary disease, unspecified: Secondary | ICD-10-CM | POA: Diagnosis not present

## 2020-07-26 ENCOUNTER — Other Ambulatory Visit: Payer: Self-pay

## 2020-07-26 ENCOUNTER — Ambulatory Visit: Payer: Medicare PPO | Admitting: Internal Medicine

## 2020-07-26 ENCOUNTER — Ambulatory Visit
Admission: RE | Admit: 2020-07-26 | Discharge: 2020-07-26 | Disposition: A | Discharge status: Home / Self Care | Payer: Medicare PPO | Source: Ambulatory Visit | Attending: Internal Medicine | Admitting: Internal Medicine

## 2020-07-26 ENCOUNTER — Encounter: Payer: Self-pay | Admitting: Internal Medicine

## 2020-07-26 VITALS — BP 138/70 | HR 82 | Ht 67.0 in | Wt 142.0 lb

## 2020-07-26 DIAGNOSIS — K5900 Constipation, unspecified: Secondary | ICD-10-CM | POA: Diagnosis not present

## 2020-07-26 DIAGNOSIS — H01131 Eczematous dermatitis of right upper eyelid: Secondary | ICD-10-CM | POA: Diagnosis not present

## 2020-07-26 DIAGNOSIS — I700 Atherosclerosis of aorta: Secondary | ICD-10-CM | POA: Diagnosis not present

## 2020-07-26 NOTE — Progress Notes (Signed)
Subjective: Jeffrey Meadows is a 86 y.o. male patient seen today for at risk foot care. Patient has h/o PAD and painful mycotic nails b/l that are difficult to trim. Pain interferes with ambulation. Aggravating factors include wearing enclosed shoe gear. Pain is relieved with periodic professional debridement.  He relates tenderness to right foot and points to right foot, submet head 1. Denies any episodes of trauma. Denies any drainage or open wounds.  His wife is present during today's visit. She is concerned his heels are dry again.   Patient Active Problem List   Diagnosis Date Noted  . Chronic pain of right wrist 04/06/2020  . Knee instability 10/28/2019  . Chronic respiratory failure with hypoxia ([place]) 10/02/2017  . Acute midline low back pain without sciatica 06/29/2016  . Chronic alcoholism ([place]) 06/29/2016  . Osteoarthritis of cervical spine 03/01/2016  . Strain of left trapezius muscle 10/21/2015  . Peripheral vascular disease, unspecified ([place]) 12/02/2013  . Essential tremor 10/14/2013  . Hoarseness of voice 04/12/2013  . Nasal septal deviation 07/16/2012  . Chronic foot pain, left 05/09/2012  . Claudication in peripheral vascular disease ([place]) 11/21/2011  . Constipation 03/09/2011  . Hearing impairment 03/09/2011  . Allergic rhinitis 02/02/2011  . Hip pain, chronic 06/08/2010  . Tobacco use disorder, severe, in sustained remission 03/18/2010  . BPH with obstruction/lower urinary tract symptoms 06/17/2008  . ANOMALY, CONGENITAL HYPOSPADIAS 04/23/2007  . Hyperlipidemia 10/11/2006  . HYPERTENSION, BENIGN SYSTEMIC 10/11/2006  . HEMORRHOIDS, NOS 10/11/2006  . COPD with emphysema ([place]) 10/11/2006  . PSORIASIS 10/11/2006  . GAIT, ABNORMAL 10/11/2006    Current Outpatient Medications on File Prior to Visit  Medication Sig Dispense Refill  . albuterol (PROAIR HFA) 108 (90 Base) MCG/ACT inhaler Inhale 1-2 puffs into the lungs every 4 (four) hours as needed. as needed  shortness of breath prior to exercise (Patient taking differently: Inhale 1-2 puffs into the lungs every 4 (four) hours as needed for wheezing or shortness of breath (shortness of breath prior to exercise).) 54 g 3  . AMBULATORY NON FORMULARY MEDICATION Ketoprofen 20% cream 1 Tube 2  . amLODipine (NORVASC) 10 MG tablet TAKE 1 TABLET BY MOUTH EVERY DAY 90 tablet 1  . atorvastatin (LIPITOR) 20 MG tablet TAKE 1 TABLET BY MOUTH EVERY DAY 90 tablet 3  . calcipotriene (DOVONOX) 0.005 % ointment Apply 1 application topically daily. Applies to affected area.  3  . Cholecalciferol (VITAMIN D) 2000 UNITS CAPS Take 2,000 Units by mouth 2 (two) times a week.     . diphenoxylate-atropine (LOMOTIL) 2.5-0.025 MG tablet Take 1 tablet by mouth every 12 (twelve) hours as needed for diarrhea or loose stools. 30 tablet 0  . docusate sodium (COLACE) 100 MG capsule Take 100 mg by mouth daily.    . finasteride (PROSCAR) 5 MG tablet Take 5 mg by mouth daily.  3  . hydrochlorothiazide (HYDRODIURIL) 25 MG tablet TAKE 1 TABLET BY MOUTH EVERY DAY IN THE MORNING 90 tablet 3  . naproxen (NAPROSYN) 500 MG tablet TAKE 1 TABLET BY MOUTH TWICE A DAY WITH FOOD 180 tablet 1  . ondansetron (ZOFRAN) 4 MG tablet Take 1 tablet (4 mg total) by mouth every 8 (eight) hours as needed for nausea or vomiting. 20 tablet 0  . polyethylene glycol powder (GLYCOLAX/MIRALAX) powder Take 17 g by mouth 2 (two) times daily as needed. 3350 g 11  . STIOLTO RESPIMAT 2.5-2.5 MCG/ACT AERS INHALE 2 PUFFS BY MOUTH INTO THE LUNGS DAILY 4 g 3  .  tamsulosin (FLOMAX) 0.4 MG CAPS capsule Take 0.8 mg by mouth daily.   11  . traMADol (ULTRAM) 50 MG tablet One po with food once or twice daily as needed for chronic osteoarthritis pain 30 tablet 0   No current facility-administered medications on file prior to visit.    Allergies  Allergen Reactions  . Antihistamines, Diphenhydramine-Type Other (See Comments)    Enlarged prostate    Objective: Physical  Exam  General: 86 y.o. Caucasian male, WD, WN in NAD. AAO x 3.  Neurovascular status unchanged b/l.  Capillary refill time to digits immediate b/l. DP pulse nonpalpable left foot. DP pulse palpable right foot. PT pulse faintly palpable left foot. PT pulse nonpalpable right foot. Pedal hair present. Lower extremity skin temperature gradient within normal limits.  Protective sensation intact 5/5 intact bilaterally with 10g monofilament b/l. Vibratory sensation intact b/l.  Dermatological:  Pedal skin with normal turgor, texture and tone bilaterally. No open wounds bilaterally. No interdigital macerations bilaterally. Toenails 1-5 b/l elongated, discolored, dystrophic, thickened, crumbly with subungual debris and tenderness to dorsal palpation. Heels are dry and beginning to fissure again. No erythema, no edema, no drainage, no blisters, no fluctuance.  Musculoskeletal:  Normal muscle strength 5/5 to all lower extremity muscle groups bilaterally. No gross bony deformities bilaterally. No pain crepitus or joint limitation noted with ROM b/l.  He has pain under sesamoids. Fat pad atrophy noted. No open wound.  He is wearing [employer].  Assessment and Plan:  1. Pain due to onychomycosis of toenails of both feet   2. Sesamoiditis of right foot   3. Xerosis cutis   4. Pain in right foot    -Examined patient. -For heels, wife was instructed to apply Aquaphor Healing Ointment once daily.  -For sesamoiditis right foot, applied foam padding for comfort. Wife will schedule appt with Pedorthist for further offloading. -Toenails 1-5 b/l were debrided in length and girth with sterile nail nippers and dremel without iatrogenic bleeding.  -Patient to continue soft, supportive shoe gear daily. -Patient to report any pedal injuries to medical professional immediately. -Patient/POA to call should there be question/concern in the interim.  Return in about 3 months (around 10/19/2020) for painful  mycotic toenails.  Marzetta Board, DPM

## 2020-07-26 NOTE — Progress Notes (Signed)
   Subjective:    Patient ID: Jeffrey Meadows, male    DOB: 02-08-1933, 86 y.o.   MRN: [id number]  HPI  86 year old Male seen today with complaint of diarrhea.  He is accompanied by his wife.  Having issues with diarrhea.  Has tried stool softener for constipation.  Medications reviewed.    Had colonoscopy at [facility] in 2011.  Findings were normal except for hemorrhoids.    Review of Systems Cologard      Objective:   Physical Exam Blood pressure 138/70 pulse 82 pulse oximetry 94% weight 142 pounds BMI 22.24  Abdomen: Soft nondistended without hepatosplenomegaly masses or tenderness.  Stool is guaiac negative.  No masses on rectal exam.  Has mild dermatitis on eyelid that has been present for a couple of weeks.  Not sure what is causing this.  Have prescribed hydrocortisone cream 1% to use on eyelid twice daily for 4 weeks.  If not improving will need to see eye physician.     Assessment & Plan:  My feeling is that he is complaining of diarrhea but may have constipation as the major issue.  He will have KUB abdominal film and will advise further after reviewing that.  Addendum: Has large amount of stool throughout the colon despite complaining of diarrhea.  Suspect major issue is constipation.  Advised MiraLAX.  Has been taking Pepto-Bismol and Colace.  Recommend Metamucil daily.  Right eyelid dermatitis-try hydrocortisone 1% cream twice daily for 4 weeks.

## 2020-07-26 NOTE — Patient Instructions (Addendum)
Patient to stop Peptobismol and Colace. Try metamucil as bulking agent instead. Have KUB today.  Hydrocortisone 1% to eyelid twice daily up to 4 weeks.  Call eye physician if this does not help.

## 2020-07-27 ENCOUNTER — Telehealth: Payer: Self-pay

## 2020-07-27 MED ORDER — HYDROCORTISONE 1 % EX CREA: TOPICAL_CREAM | CUTANEOUS | 0 refills | Status: DC

## 2020-07-27 NOTE — Telephone Encounter (Signed)
Hydrocortisone cream sent to pharmacy.  Left detailed message informing patient.

## 2020-07-27 NOTE — Telephone Encounter (Signed)
Patients wife called asking what patient should use on his eye.  He has some irritation that has been present for a couple weeks now.  Denies itching or exposure.  Wife says it was discussed at visit but not sure name of the medication and if it was over the counter.  Please advise.

## 2020-07-27 NOTE — Telephone Encounter (Signed)
Call her back. They usually do not answer phone and you have to leave a message.  Send in Rx :Hydrocortisone cream  1%  to use on eyelid twice a day for 4 weeks and  then if not improving, needs to see his eye physician.

## 2020-07-29 ENCOUNTER — Other Ambulatory Visit: Payer: Medicare PPO | Admitting: Orthotics

## 2020-08-17 DIAGNOSIS — I499 Cardiac arrhythmia, unspecified: Secondary | ICD-10-CM | POA: Diagnosis not present

## 2020-08-17 DIAGNOSIS — I455 Other specified heart block: Secondary | ICD-10-CM | POA: Diagnosis not present

## 2020-08-24 DIAGNOSIS — J449 Chronic obstructive pulmonary disease, unspecified: Secondary | ICD-10-CM | POA: Diagnosis not present

## 2020-09-07 ENCOUNTER — Telehealth: Payer: Self-pay | Admitting: Internal Medicine

## 2020-09-07 ENCOUNTER — Telehealth: Payer: Self-pay | Admitting: Family Medicine

## 2020-09-07 NOTE — Telephone Encounter (Signed)
Pts wife [name]  called wanting to know if the pt could have another cortisone hip injection, his last one was 07/19/20. Also she stated she believes the pt needs an X-ray of his R knee because he's in so much pain. They would like a CB to let them know what options thgey have to take care of this pain.  [phone]

## 2020-09-07 NOTE — Telephone Encounter (Signed)
Called and talked with [name] and let her know what Dr Renold Genta said, she verbalized understanding

## 2020-09-07 NOTE — Telephone Encounter (Signed)
Please advise 

## 2020-09-07 NOTE — Telephone Encounter (Signed)
Yes, we can try one more injection.  Also will get x-ray of knee and probably lumbar spine while he's here.  If the injection doesn't help this time, then we'll consider an MRI of the lumbar spine.

## 2020-09-07 NOTE — Telephone Encounter (Signed)
It is Ok to try but don't know that it will help much. Don't know the dose recommended either.

## 2020-09-07 NOTE — Telephone Encounter (Signed)
Ediberto Sens [phone]  [name] called to ask if it would be alright for Amador to take Tumeric for arthritis pain. She had been reading about it and some friends had told her about it.

## 2020-09-07 NOTE — Telephone Encounter (Signed)
I called and spoke with [name]. Appointment scheduled for tomorrow afternoon for injection and xrays.

## 2020-09-08 ENCOUNTER — Ambulatory Visit: Payer: Medicare PPO | Admitting: Podiatry

## 2020-09-08 ENCOUNTER — Ambulatory Visit: Payer: Medicare PPO | Admitting: Family Medicine

## 2020-09-08 ENCOUNTER — Encounter: Payer: Self-pay | Admitting: Podiatry

## 2020-09-08 ENCOUNTER — Ambulatory Visit (INDEPENDENT_AMBULATORY_CARE_PROVIDER_SITE_OTHER): Payer: Medicare PPO

## 2020-09-08 ENCOUNTER — Other Ambulatory Visit: Payer: Self-pay

## 2020-09-08 ENCOUNTER — Encounter: Payer: Self-pay | Admitting: Family Medicine

## 2020-09-08 DIAGNOSIS — M5440 Lumbago with sciatica, unspecified side: Secondary | ICD-10-CM

## 2020-09-08 DIAGNOSIS — M25871 Other specified joint disorders, right ankle and foot: Secondary | ICD-10-CM | POA: Diagnosis not present

## 2020-09-08 DIAGNOSIS — G8929 Other chronic pain: Secondary | ICD-10-CM

## 2020-09-08 DIAGNOSIS — S91112A Laceration without foreign body of left great toe without damage to nail, initial encounter: Secondary | ICD-10-CM

## 2020-09-08 DIAGNOSIS — M79674 Pain in right toe(s): Secondary | ICD-10-CM

## 2020-09-08 DIAGNOSIS — M25561 Pain in right knee: Secondary | ICD-10-CM | POA: Diagnosis not present

## 2020-09-08 DIAGNOSIS — B351 Tinea unguium: Secondary | ICD-10-CM

## 2020-09-08 DIAGNOSIS — L853 Xerosis cutis: Secondary | ICD-10-CM | POA: Diagnosis not present

## 2020-09-08 DIAGNOSIS — M79675 Pain in left toe(s): Secondary | ICD-10-CM | POA: Diagnosis not present

## 2020-09-08 NOTE — Progress Notes (Signed)
Office Visit Note   Patient: Jeffrey Meadows           Date of Birth: 16-Mar-1933           MRN: [id number] Visit Date: 09/08/2020 Requested by: [name], MD [address] [place],  [place] [id number] PCP: [name], MD  Subjective: Chief Complaint  Patient presents with  . Right Hip - Pain, Follow-up    Repeat trochanteric cortisone injection  . Right Knee - Pain  . Lower Back - Pain    HPI: He is here with recurrent right hip pain.  Greater trochanter injection last month helped for about a week, then it seemed to wear off.  He is hurting but not quite as badly as before.  He continues to have unsteady gait.               ROS:   All other systems were reviewed and are negative.  Objective: Vital Signs: There were no vitals taken for this visit.  Physical Exam:  General:  Alert and oriented, in no acute distress. Pulm:  Breathing unlabored. Psy:  Normal mood, congruent affect.  Right hip: He is tender still at the greater trochanter.  He has limited internal rotation motion with some pain but not severe.  Remainder of lower extremity strength is normal.   Right knee: He has no joint effusion in the knee, slight tenderness around the joint line.    Imaging: XR Knee 1-2 Views Right  Result Date: 09/08/2020 X-rays of the right knee reveal moderate medial compartment DJD with joint space narrowing.  IM rod present in the femur.  No acute abnormality seen.  XR Lumbar Spine 2-3 Views  Result Date: 09/08/2020 X-rays lumbar spine reveal old L1 compression deformity with evidence of prior vertebroplasty.  There is diffuse degenerative disc disease with narrowing of the foramen at multiple levels.  No new compression fracture seen, no sign of neoplasm.   Assessment & Plan: 1.  Right hip greater trochanter syndrome, cannot rule out referred pain from lumbar stenosis. -His pain is tolerable, he does not want to have an injection today.  We will try lateral leg  raises for strengthening.  He will try turmeric, CBD oil over-the-counter. -If pain worsens we will try 1 more greater trochanter injection.  If that does not help, then lumbar MRI scan.     Procedures: No procedures performed        PMFS History: Patient Active Problem List   Diagnosis Date Noted  . Chronic pain of right wrist 04/06/2020  . Knee instability 10/28/2019  . Chronic respiratory failure with hypoxia ([place]) 10/02/2017  . Acute midline low back pain without sciatica 06/29/2016  . Chronic alcoholism ([place]) 06/29/2016  . Osteoarthritis of cervical spine 03/01/2016  . Strain of left trapezius muscle 10/21/2015  . Peripheral vascular disease, unspecified ([place]) 12/02/2013  . Essential tremor 10/14/2013  . Hoarseness of voice 04/12/2013  . Nasal septal deviation 07/16/2012  . Chronic foot pain, left 05/09/2012  . Claudication in peripheral vascular disease ([place]) 11/21/2011  . Constipation 03/09/2011  . Hearing impairment 03/09/2011  . Allergic rhinitis 02/02/2011  . Hip pain, chronic 06/08/2010  . Tobacco use disorder, severe, in sustained remission 03/18/2010  . BPH with obstruction/lower urinary tract symptoms 06/17/2008  . ANOMALY, CONGENITAL HYPOSPADIAS 04/23/2007  . Hyperlipidemia 10/11/2006  . HYPERTENSION, BENIGN SYSTEMIC 10/11/2006  . HEMORRHOIDS, NOS 10/11/2006  . COPD with emphysema ([place]) 10/11/2006  . PSORIASIS 10/11/2006  .  GAIT, ABNORMAL 10/11/2006   Past Medical History:  Diagnosis Date  . Adenomatous polyp of colon 08/2001   removed hepatic flexure  . Arthritis   . Cataract   . Chronic back pain    stenosis/scoliosis/synovial cyst  . Enlarged prostate    takes Proscar daily  . History of blood transfusion    no abnormal reaction noted  . Hyperlipidemia    takes Atorvastatin daily  . Hypertension    takes Amlodipine and HCTZ daily  . Joint pain   . On home oxygen therapy    at night  . Raynaud disease   . Urinary frequency    takes Flomax  daily  . Villous adenoma of colon 11/.2001   2 cm removed cecum    Family History  Problem Relation Age of Onset  . Heart failure Mother   . Heart disease Mother   . Aneurysm Father     Past Surgical History:  Procedure Laterality Date  . ABDOMINAL AORTOGRAM N/A 08/15/2017   Procedure: ABDOMINAL AORTOGRAM;  Surgeon: Waynetta Sandy, MD;  Location: [place] [facility];  Service: Cardiovascular;  Laterality: N/A;  . cataract surgery    . CYSTOSCOPY WITH LITHOLAPAXY N/A 01/11/2016   Procedure: CYSTOSCOPY WITH LITHOLAPAXY;  Surgeon: Festus Aloe, MD;  Location: [facility];  Service: Urology;  Laterality: N/A;  . GREEN LIGHT LASER TURP (TRANSURETHRAL RESECTION OF PROSTATE N/A 01/11/2016   Procedure: GREEN LIGHT LASER TURP (TRANSURETHRAL RESECTION OF PROSTATE;  Surgeon: Festus Aloe, MD;  Location: [facility];  Service: Urology;  Laterality: N/A;  . HERNIA REPAIR Left    inguinal  . HOLMIUM LASER APPLICATION N/A 1/75/1025   Procedure: HOLMIUM LASER APPLICATION;  Surgeon: Festus Aloe, MD;  Location: [facility];  Service: Urology;  Laterality: N/A;  . JOINT REPLACEMENT     Right Hip  . LAMINECTOMY Right 07/02/2014   Procedure: Right Lumbar two-three Laminectomy for Synovial Cyst;  Surgeon: Erline Levine, MD;  Location: [place] [facility];  Service: Neurosurgery;  Laterality: Right;  . LOWER EXTREMITY ANGIOGRAPHY Bilateral 08/15/2017   Procedure: LOWER EXTREMITY ANGIOGRAPHY;  Surgeon: Waynetta Sandy, MD;  Location: [facility] [facility];  Service: Cardiovascular;  Laterality: Bilateral;  . POPLITEAL VENOUS ANEURYSM REPAIR Left 2010   about 6 yrs ago  . PROSTATE BIOPSY  4/95  . SPINE SURGERY  Jan. 2016   Dr. Vickie Epley  . TONSILLECTOMY    . TOTAL HIP ARTHROPLASTY  1952   right   Social History   Occupational History  . 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 Smoking status: Former Smoker    Packs/day: 1.50     Years: 40.00    Pack years: 60.00    Types: Cigarettes    Quit date: 02/04/1989    Years since quitting: 31.6  . Smokeless tobacco: Never Used  . Tobacco comment: quit smoking about 76yrs ago  Substance and Sexual Activity  . Alcohol use: Yes    Comment: 3-5 glasses of wine daily, 12-20 glasses a week  . Drug use: No  . Sexual activity: Not Currently

## 2020-09-08 NOTE — Patient Instructions (Signed)
    Turmeric:  500 mg twice daily   

## 2020-09-12 NOTE — Progress Notes (Signed)
Subjective: Jeffrey Meadows is a 87 y.o. male patient seen today follow up of painful sesamoiditis of right foot and xerosis/fissuring of both heels. He also relates tenderness to left 2nd toe today. Denies any trauma to foot.  He did see Pedorthist and states inserts did not work for Jeffrey Meadows.   His wife is present during today's visit. She is concerned his heels are dry again.   Patient Active Problem List   Diagnosis Date Noted  . Chronic pain of right wrist 04/06/2020  . Knee instability 10/28/2019  . Chronic respiratory failure with hypoxia ([place]) 10/02/2017  . Acute midline low back pain without sciatica 06/29/2016  . Chronic alcoholism ([place]) 06/29/2016  . Osteoarthritis of cervical spine 03/01/2016  . Strain of left trapezius muscle 10/21/2015  . Peripheral vascular disease, unspecified ([place]) 12/02/2013  . Essential tremor 10/14/2013  . Hoarseness of voice 04/12/2013  . Nasal septal deviation 07/16/2012  . Chronic foot pain, left 05/09/2012  . Claudication in peripheral vascular disease ([place]) 11/21/2011  . Constipation 03/09/2011  . Hearing impairment 03/09/2011  . Allergic rhinitis 02/02/2011  . Hip pain, chronic 06/08/2010  . Tobacco use disorder, severe, in sustained remission 03/18/2010  . BPH with obstruction/lower urinary tract symptoms 06/17/2008  . ANOMALY, CONGENITAL HYPOSPADIAS 04/23/2007  . Hyperlipidemia 10/11/2006  . HYPERTENSION, BENIGN SYSTEMIC 10/11/2006  . HEMORRHOIDS, NOS 10/11/2006  . COPD with emphysema ([place]) 10/11/2006  . PSORIASIS 10/11/2006  . GAIT, ABNORMAL 10/11/2006    Current Outpatient Medications on File Prior to Visit  Medication Sig Dispense Refill  . albuterol (PROAIR HFA) 108 (90 Base) MCG/ACT inhaler Inhale 1-2 puffs into the lungs every 4 (four) hours as needed. as needed shortness of breath prior to exercise (Patient taking differently: Inhale 1-2 puffs into the lungs every 4 (four) hours as needed for wheezing or shortness of  breath (shortness of breath prior to exercise).) 54 g 3  . AMBULATORY NON FORMULARY MEDICATION Ketoprofen 20% cream 1 Tube 2  . amLODipine (NORVASC) 10 MG tablet TAKE 1 TABLET BY MOUTH EVERY DAY 90 tablet 1  . atorvastatin (LIPITOR) 20 MG tablet TAKE 1 TABLET BY MOUTH EVERY DAY 90 tablet 3  . calcipotriene (DOVONOX) 0.005 % ointment Apply 1 application topically daily. Applies to affected area.  3  . Cholecalciferol (VITAMIN D) 2000 UNITS CAPS Take 2,000 Units by mouth 2 (two) times a week.     . diphenoxylate-atropine (LOMOTIL) 2.5-0.025 MG tablet Take 1 tablet by mouth every 12 (twelve) hours as needed for diarrhea or loose stools. 30 tablet 0  . docusate sodium (COLACE) 100 MG capsule Take 100 mg by mouth daily.    . finasteride (PROSCAR) 5 MG tablet Take 5 mg by mouth daily.  3  . hydrochlorothiazide (HYDRODIURIL) 25 MG tablet TAKE 1 TABLET BY MOUTH EVERY DAY IN THE MORNING 90 tablet 3  . hydrocortisone cream 1 % Apply twice daily for 4 weeks 120 g 0  . naproxen (NAPROSYN) 500 MG tablet TAKE 1 TABLET BY MOUTH TWICE A DAY WITH FOOD 180 tablet 1  . ondansetron (ZOFRAN) 4 MG tablet Take 1 tablet (4 mg total) by mouth every 8 (eight) hours as needed for nausea or vomiting. 20 tablet 0  . polyethylene glycol powder (GLYCOLAX/MIRALAX) powder Take 17 g by mouth 2 (two) times daily as needed. 3350 g 11  . STIOLTO RESPIMAT 2.5-2.5 MCG/ACT AERS INHALE 2 PUFFS BY MOUTH INTO THE LUNGS DAILY 4 g 3  . tamsulosin (FLOMAX) 0.4 MG CAPS  capsule Take 0.8 mg by mouth daily.   11  . traMADol (ULTRAM) 50 MG tablet One po with food once or twice daily as needed for chronic osteoarthritis pain 30 tablet 0   No current facility-administered medications on file prior to visit.    Allergies  Allergen Reactions  . Antihistamines, Diphenhydramine-Type Other (See Comments)    Enlarged prostate    Objective: Physical Exam  General: 87 y.o. Caucasian male, WD, WN in NAD. AAO x 3.  Neurovascular status unchanged  b/l.  Capillary refill time to digits immediate b/l. DP pulse nonpalpable left foot. DP pulse palpable right foot. PT pulse faintly palpable left foot. PT pulse nonpalpable right foot. Pedal hair present. Lower extremity skin temperature gradient within normal limits.  Protective sensation intact 5/5 intact bilaterally with 10g monofilament b/l. Vibratory sensation intact b/l.  Dermatological:  Pedal skin is thin, shiny and atrophic b/l.  No open wounds bilaterally. No interdigital macerations bilaterally. Heels are dry and beginning to fissure again. No erythema, no edema, no drainage, no blisters, no fluctuance.  He has healing laceration of distal tip of left hallux and he states maybe this is the area of tenderness. No erythema, no edema, no drainage, no fluctuance.  Musculoskeletal:  Normal muscle strength 5/5 to all lower extremity muscle groups bilaterally. Plantar fat pad atrophy of forefoot area b/l lower extremities.  Erythema resolved. Tenderness resolved.  He is wearing [employer].  Assessment and Plan:  1. Sesamoiditis of right foot   2. Laceration of left great toe without foreign body present or damage to nail, initial encounter   3. Xerosis cutis    -Examined patient. -For xerosis, wife was instructed to continue to apply Aquaphor Healing Ointment once daily.  -For laceration left hallux, digit cleansed. Triple antibiotic ointment applied. Wife instructed to apply Neosporin and band-aid once daily until healed. Cover with digital toe cap for protection when wearing shoe gear. Call office if condition worsens. -For sesamoiditis right foot, applied new foam aperture pad submet head 1. -Patient to continue soft, supportive shoe gear daily. -Patient to report any pedal injuries to medical professional immediately. -Patient/POA to call should there be question/concern in the interim. -Keep next scheduled appointment.  Jeffrey Meadows, DPM

## 2020-09-15 ENCOUNTER — Other Ambulatory Visit: Payer: Self-pay | Admitting: Internal Medicine

## 2020-09-24 DIAGNOSIS — J449 Chronic obstructive pulmonary disease, unspecified: Secondary | ICD-10-CM | POA: Diagnosis not present

## 2020-09-26 NOTE — Progress Notes (Deleted)
Cardiology Office Note:    Date:  09/26/2020   ID:  Jeffrey Meadows, DOB 01-Oct-1932, MRN [id number]  PCP:  [name], MD  Cardiologist:  No primary care provider on file.  Electrophysiologist:  None   Referring MD: [name], MD   No chief complaint on file.   History of Present Illness:    Jeffrey Meadows is a 87 y.o. male with a hx of hypertension, hyperlipidemia, Raynaud's disease, BPH, COPD, PAD who presents for follow-up.  He was referred by Dr. Renold Genta for evaluation of irregular heart rhythm, initially seen on 06/22/2020.  He reports that he has been doing well.  Denies any palpitations or chest pain.  Does report that he gets short of breath, particular with climbing up stairs.  States that he exercises with bicycle 15 to 20 minutes, in addition to doing push-ups and sit ups, most days of the week.  Denies any symptoms with this.  Does report he becomes lightheaded if standing too quickly.  Denies any syncope.  Smoked 1.5 ppd x 40 years, quit in mid 75s.  Father died of MI in late 60s/early 80s.    Zio patch x4 days on 06/24/2020 showed 2.1-second pause, possibly Mobitz II, his symptoms reported.  Zio patch x14 days on 08/18/2020 showed no pauses, one episode of NSVT lasting 15 beats, 19 episodes of SVT, longest lasting 20 beats.  Echocardiogram on 07/16/2020 showed normal biventricular function, no significant valvular disease, mild dilatation of aortic root measuring 41 mm.  Since last clinic visit,  Past Medical History:  Diagnosis Date  . Adenomatous polyp of colon 08/2001   removed hepatic flexure  . Arthritis   . Cataract   . Chronic back pain    stenosis/scoliosis/synovial cyst  . Enlarged prostate    takes Proscar daily  . History of blood transfusion    no abnormal reaction noted  . Hyperlipidemia    takes Atorvastatin daily  . Hypertension    takes Amlodipine and HCTZ daily  . Joint pain   . On home oxygen therapy    at night  . Raynaud disease   .  Urinary frequency    takes Flomax daily  . Villous adenoma of colon 11/.2001   2 cm removed cecum    Past Surgical History:  Procedure Laterality Date  . ABDOMINAL AORTOGRAM N/A 08/15/2017   Procedure: ABDOMINAL AORTOGRAM;  Surgeon: Waynetta Sandy, MD;  Location: [facility];  Service: Cardiovascular;  Laterality: N/A;  . cataract surgery    . CYSTOSCOPY WITH LITHOLAPAXY N/A 01/11/2016   Procedure: CYSTOSCOPY WITH LITHOLAPAXY;  Surgeon: Festus Aloe, MD;  Location: [facility];  Service: Urology;  Laterality: N/A;  . GREEN LIGHT LASER TURP (TRANSURETHRAL RESECTION OF PROSTATE N/A 01/11/2016   Procedure: GREEN LIGHT LASER TURP (TRANSURETHRAL RESECTION OF PROSTATE;  Surgeon: Festus Aloe, MD;  Location: [facility];  Service: Urology;  Laterality: N/A;  . HERNIA REPAIR Left    inguinal  . HOLMIUM LASER APPLICATION N/A 3/33/5456   Procedure: HOLMIUM LASER APPLICATION;  Surgeon: Festus Aloe, MD;  Location: [facility];  Service: Urology;  Laterality: N/A;  . JOINT REPLACEMENT     Right Hip  . LAMINECTOMY Right 07/02/2014   Procedure: Right Lumbar two-three Laminectomy for Synovial Cyst;  Surgeon: Erline Levine, MD;  Location: [facility];  Service: Neurosurgery;  Laterality: Right;  . LOWER EXTREMITY ANGIOGRAPHY Bilateral 08/15/2017   Procedure: LOWER EXTREMITY ANGIOGRAPHY;  Surgeon: Waynetta Sandy, MD;  Location: [facility];  Service: Cardiovascular;  Laterality: Bilateral;  . POPLITEAL VENOUS ANEURYSM REPAIR Left 2010   about 6 yrs ago  . PROSTATE BIOPSY  4/95  . SPINE SURGERY  Jan. 2016   Dr. Vickie Epley  . TONSILLECTOMY    . TOTAL HIP ARTHROPLASTY  1952   right    Current Medications: No outpatient medications have been marked as taking for the 09/28/20 encounter (Appointment) with Donato Heinz, MD.     Allergies:   Antihistamines, diphenhydramine-type   Social History   Socioeconomic History  . Marital status: Married    Spouse name:  Jeffrey Meadows  . Number of children: 1  . Years of education: 82  . Highest education level: Not on file  Occupational History  . 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 Smoking status: Former Smoker    Packs/day: 1.50    Years: 40.00    Pack years: 60.00    Types: Cigarettes    Quit date: 02/04/1989    Years since quitting: 31.6  . Smokeless tobacco: Never Used  . Tobacco comment: quit smoking about 65yrs ago  Substance and Sexual Activity  . Alcohol use: Yes    Comment: 3-5 glasses of wine daily, 12-20 glasses a week  . Drug use: No  . Sexual activity: Not Currently  Other Topics Concern  . 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   Exercise: Pt reports exercising 4 days a week for 60 minutes.   Seatbelts: Pt reports wearing seatbelt when in vehicles.    Sun Exposure/Protection: Pt reports not using sun protection.   Hobbies: reading, writing, music               Social Determinants of Health   Financial Resource Strain: Not on file  Food Insecurity: Not on file  Transportation Needs: Not on file  Physical Activity: Not on file  Stress: Not on file  Social Connections: Not on file     Family History: The patient's family history includes Aneurysm in his father; Heart disease in his mother; Heart failure in his mother.  ROS:   Please see the history of present illness.     All other systems reviewed and are negative.  EKGs/Labs/Other Studies Reviewed:    The following studies were reviewed today:   EKG:  EKG is ordered today.  The ekg ordered today demonstrates normal sinus rhythm, rate 77, no ST/T abnormalities  Recent  Labs: 03/02/2020: TSH 1.49 06/03/2020: ALT 16; BUN 13; Creat 0.78; Hemoglobin 15.0; Platelets 284; Potassium 3.6; Sodium 136  Recent Lipid Panel    Component Value Date/Time   CHOL 148 06/03/2020 0949   TRIG 64 06/03/2020 0949   HDL 77 06/03/2020 0949   CHOLHDL 1.9 06/03/2020 0949   VLDL 13 03/13/2017 1034   LDLCALC 57 06/03/2020 0949    Physical Exam:    VS:  There were no vitals taken for this visit.    Wt Readings from Last 3 Encounters:  07/26/20 142 lb (64.4 kg)  06/22/20 150 lb 3.2 oz (68.1 kg)  06/04/20 149 lb (67.6 kg)     GEN:  Well nourished, well developed in no acute distress HEENT: Normal NECK: No JVD; No carotid bruits LYMPHATICS: No lymphadenopathy CARDIAC: RRR, no murmurs, rubs, gallops RESPIRATORY:  Clear to auscultation without rales, wheezing or rhonchi  ABDOMEN: Soft, non-tender, non-distended MUSCULOSKELETAL:  No edema; No deformity  SKIN: Warm and dry NEUROLOGIC:  Alert and oriented x 3 PSYCHIATRIC:  Normal affect   ASSESSMENT:    No diagnosis found. PLAN:    Bradycardia: Zio patch x4 days on 06/24/2020 showed 2.1-second pause, possibly Mobitz II, his symptoms reported.  Zio patch x14 days on 08/18/2020 showed no pauses, one episode of NSVT lasting 15 beats, 19 episodes of SVT, longest lasting 20 beats.  Echocardiogram on 07/16/2020 showed normal biventricular function, no significant valvular disease  Ascending aortic dilatation: Mild dilatation of aortic root measuring 41 mm.  Follow-up with [facility] in 1 year  Dyspnea: Suspect due to COPD, no structural heart disease on echocardiogram  Hypertension: On amlodipine 10 mg daily and hydrochlorothiazide 25 mg daily.  Appears controlled  Hyperlipidemia: On atorvastatin 20 mg daily.  LDL 57 on 06/03/20  PAD: Status post left SFA to popliteal artery bypass graft in 2006 for aneurysm.  Follows with Dr. Donzetta Matters  RTC in***   Medication Adjustments/Labs and Tests Ordered: Current medicines are reviewed at  length with the patient today.  Concerns regarding medicines are outlined above.  No orders of the defined types were placed in this encounter.  No orders of the defined types were placed in this encounter.   There are no Patient Instructions on file for this visit.   Signed, Donato Heinz, MD  09/26/2020 4:42 PM    [place]

## 2020-09-28 ENCOUNTER — Ambulatory Visit: Payer: Medicare PPO | Admitting: Cardiology

## 2020-10-18 ENCOUNTER — Other Ambulatory Visit: Payer: Self-pay | Admitting: Internal Medicine

## 2020-10-22 DIAGNOSIS — J449 Chronic obstructive pulmonary disease, unspecified: Secondary | ICD-10-CM | POA: Diagnosis not present

## 2020-10-25 ENCOUNTER — Other Ambulatory Visit: Payer: Self-pay

## 2020-10-25 ENCOUNTER — Ambulatory Visit: Payer: Medicare PPO | Admitting: Podiatry

## 2020-10-29 ENCOUNTER — Other Ambulatory Visit: Payer: Self-pay

## 2020-10-29 ENCOUNTER — Encounter: Payer: Self-pay | Admitting: Podiatry

## 2020-10-29 ENCOUNTER — Ambulatory Visit: Payer: Medicare PPO | Admitting: Podiatry

## 2020-10-29 DIAGNOSIS — B351 Tinea unguium: Secondary | ICD-10-CM | POA: Diagnosis not present

## 2020-10-29 DIAGNOSIS — M79675 Pain in left toe(s): Secondary | ICD-10-CM | POA: Diagnosis not present

## 2020-10-29 DIAGNOSIS — M79674 Pain in right toe(s): Secondary | ICD-10-CM

## 2020-10-29 NOTE — Patient Instructions (Signed)
Recommend [employer] Loafers with stretchable uppers and memory foam insoles. They can be purchased at [employer]. 

## 2020-11-04 ENCOUNTER — Telehealth: Payer: Self-pay | Admitting: Internal Medicine

## 2020-11-04 NOTE — Telephone Encounter (Signed)
I suggest he go on clear liquids  such as soups, ginger ale, crackers , toast, and stop Colace. If no diarrhea for 2 days then advance to soft foods like grits, applesauce, puddings. We can see next week if not improving. Try Pepcid over the counter for indigestion.

## 2020-11-04 NOTE — Progress Notes (Signed)
Subjective: Jeffrey Meadows is a 87 y.o. male patient seen today follow up of painful sesamoiditis of right foot and xerosis/fissuring of both heels. He also relates tenderness to left 2nd toe today. Denies any trauma to foot.  He states his left foot feels better with use of callus pad in shoe.   His wife is present during today's visit.  Allergies  Allergen Reactions  . Antihistamines, Diphenhydramine-Type Other (See Comments)    Enlarged prostate    Objective: Physical Exam  General: 87 y.o. Caucasian male, WD, WN in NAD. AAO x 3.  Neurovascular status unchanged b/l.  Capillary refill time to digits immediate b/l. DP pulse nonpalpable left foot. DP pulse palpable right foot. PT pulse faintly palpable left foot. PT pulse nonpalpable right foot. Pedal hair present. Lower extremity skin temperature gradient within normal limits.  Protective sensation intact 5/5 intact bilaterally with 10g monofilament b/l. Vibratory sensation intact b/l.  Dermatological:  Pedal skin is thin, shiny and atrophic b/l.  No open wounds bilaterally. No interdigital macerations bilaterally. Heels are dry and beginning to fissure again. No erythema, no edema, no drainage, no blisters, no fluctuance.   Musculoskeletal:  Normal muscle strength 5/5 to all lower extremity muscle groups bilaterally. Plantar fat pad atrophy of forefoot area b/l lower extremities.  Erythema resolved. Tenderness resolved.  He is wearing [employer].  Assessment and Plan:  1. Pain due to onychomycosis of toenails of both feet    -Examined patient. -Due to forefoot fat pad atrophy, I did recommend a shoe with better shock absorption for Jeffrey Meadows. I recommended [employer]chers Loafers with stretchable uppers and memory foam insoles which can be purchased at [employer].  -Patient to continue soft, supportive shoe gear daily. -Patient to report any pedal injuries to medical professional immediately. -Patient/POA to call should  there be question/concern in the interim.  Follow up 3 months.  Marzetta Board, DPM

## 2020-11-04 NOTE — Telephone Encounter (Signed)
Called and spoke with [name] gave her the information from Dr Renold Genta, she verbalized understanding.

## 2020-11-04 NOTE — Telephone Encounter (Signed)
Jeffrey Meadows [phone]  Jeffrey Meadows called to say that Jeffrey Meadows was having diarrhea , she feels like it is from taking to much colace, it has been going on for month to 6 weeks. yestrday he had upset stomach, indigestion and just feels rough today. She is going for infusion from 11:00-2:00, but would like for you to see him or talk with him this afternoon or tomorrow.

## 2020-11-15 DIAGNOSIS — H524 Presbyopia: Secondary | ICD-10-CM | POA: Diagnosis not present

## 2020-11-15 DIAGNOSIS — Z961 Presence of intraocular lens: Secondary | ICD-10-CM | POA: Diagnosis not present

## 2020-11-22 DIAGNOSIS — J449 Chronic obstructive pulmonary disease, unspecified: Secondary | ICD-10-CM | POA: Diagnosis not present

## 2020-11-25 DIAGNOSIS — L400 Psoriasis vulgaris: Secondary | ICD-10-CM | POA: Diagnosis not present

## 2020-11-25 DIAGNOSIS — L738 Other specified follicular disorders: Secondary | ICD-10-CM | POA: Diagnosis not present

## 2020-11-25 DIAGNOSIS — B353 Tinea pedis: Secondary | ICD-10-CM | POA: Diagnosis not present

## 2020-11-25 DIAGNOSIS — L720 Epidermal cyst: Secondary | ICD-10-CM | POA: Diagnosis not present

## 2020-12-12 ENCOUNTER — Other Ambulatory Visit: Payer: Self-pay | Admitting: Internal Medicine

## 2020-12-22 DIAGNOSIS — J449 Chronic obstructive pulmonary disease, unspecified: Secondary | ICD-10-CM | POA: Diagnosis not present

## 2021-01-22 DIAGNOSIS — J449 Chronic obstructive pulmonary disease, unspecified: Secondary | ICD-10-CM | POA: Diagnosis not present

## 2021-01-29 ENCOUNTER — Other Ambulatory Visit: Payer: Self-pay | Admitting: Internal Medicine

## 2021-02-01 ENCOUNTER — Telehealth: Payer: Self-pay | Admitting: Internal Medicine

## 2021-02-01 NOTE — Telephone Encounter (Signed)
[name] [phone]  [name] called to say Jeffrey Meadows has diarrhea that has been going on for the last 2- 3 months and upset stomach. It is off and on. He also has pain in his hip area sometime, may go to ortho for that.

## 2021-02-02 NOTE — Telephone Encounter (Signed)
LVM that I had schedule an appointment with [facility] on 02/18/2021 at 2:00 PM with  Vicie Mutters, PA

## 2021-02-03 NOTE — Progress Notes (Signed)
HPI male former smoker (60 pack years) followed for COPD/emphysema, chronic loculated right pneumothorax, complicated by BPH, PAD/ ASCVD PFT- 10/04/2012-severe obstructive airways disease with insignificant response to bronchodilator, emphysema pattern. Air-trapping with increased residual volume. Diffusion moderately reduced. FVC 3.03/78%, FEV11.11/44%, FEV1/FVC 0.37. TLC 115%, RV 137%, DLCO 52%. 6MWT- 10/04/2012-92%, 83%, 91%, 336 m. Significant desaturation with exertion Walk Test on room air 10/01/17-desaturated to 85% by LAP 3 with HR 94.  Corrected on oxygen 2 L saturation 97%. -------------------------------------------------------------------  02/05/20- 87 year old male former smoker (60 pack year) followed for emphysema, chronic loculated right pneumothorax, complicated by BPH, PAD/ ASCVD Pro-air HFA, Stiolto Respimat 2.5, theophylline 200 mg twice daily O2 2L sleep and prn/, APS Covax- 2 Phizer Breathing stable. No acute. Wife says he snores and falls asleep at desk in AM, 2 hr nap after lunch. He thinks he sleeps ok and doesn't mind if he gets drowsy. Not driving.  CXR 03/19/2019- IMPRESSION: 1. No acute cardiopulmonary abnormality. 2. Chronic lung disease.  Aortic Atherosclerosis (ICD10-I70.0).  02/04/21- 87 year old male former smoker (60 pack year) followed for COPD mixed type, Chronic Hypoxic Resp Failure, chronic loculated right pneumothorax, complicated by BPH, PAD/ ASCVD, HTN, Psoriasis,  -Pro-air HFA, Stiolto Respimat 2.5, theophylline 200 mg twice daily O2 2L sleep and prn/, APS Covax- 4 Phizer -----Breathing is about the same.  When he eats he sneezes and nose runs. No acute respiratory issues. Satisfied with meds. Discussed purpose of sleep O2. CXR 02/06/20-  IMPRESSION: COPD.  No superimposed acute cardiopulmonary process.   ROS-see HPI + = positive Constitutional:   No-   weight loss, night sweats, fevers, chills, fatigue, lassitude. HEENT:   No-  headaches,  difficulty swallowing, tooth/dental problems, sore throat,      +sneezing, itching, ear ache, nasal congestion, post nasal drip,  CV:  No-   chest pain, orthopnea, PND, swelling in lower extremities, anasarca,  +dizziness, palpitations Resp: +  shortness of breath with exertion or at rest.             No- productive cough,  No non-productive cough,  No- coughing up of blood.              No-   change in color of mucus.  No- wheezing.   Skin: No-   rash or lesions. GI:  No-   heartburn, indigestion, abdominal pain, nausea, vomiting, GU: MS:  No-   joint pain or swelling. . Neuro-     nothing unusual Psych:  No- change in mood or affect. No depression or anxiety.  No memory loss.  OBJ- Physical Exam    General- Alert, Oriented, Affect-appropriate, Distress- none acute, trim Skin- rash-none, lesions- none, excoriation- none, pale Lymphadenopathy- none Head- atraumatic            Eyes- Gross vision intact, PERRLA, conjunctivae and secretions clear            Ears- Hearing, canals-normal            Nose- Clear, no-Septal dev, mucus, polyps, erosion, perforation             Throat- Mallampati III , mucosa clear , drainage- none, tonsils- atrophic. No thrush Neck- flexible , trachea midline, no stridor , thyroid nl, carotid no bruit Chest - symmetrical excursion , unlabored           Heart/CV- RRR , no murmur , no gallop  , no rub, nl s1 s2                           -  JVD- none , edema- none, stasis changes- none, varices- none           Lung- +distant but clear to P&A, wheeze- none, cough-none , dullness-none, rub- none.            Chest wall-  Abd-  Br/ Gen/ Rectal- Not done, not indicated Extrem- cyanosis- none, clubbing, none, atrophy- none, strength- nl, + cane Neuro- grossly intact to observation  :

## 2021-02-04 ENCOUNTER — Ambulatory Visit: Payer: Medicare PPO | Admitting: Internal Medicine

## 2021-02-04 ENCOUNTER — Ambulatory Visit (INDEPENDENT_AMBULATORY_CARE_PROVIDER_SITE_OTHER): Payer: Medicare PPO

## 2021-02-04 ENCOUNTER — Encounter: Payer: Self-pay | Admitting: Internal Medicine

## 2021-02-04 ENCOUNTER — Other Ambulatory Visit: Payer: Self-pay

## 2021-02-04 VITALS — BP 138/60 | HR 92 | Temp 98.1°F | Ht 67.0 in | Wt 142.8 lb

## 2021-02-04 DIAGNOSIS — J449 Chronic obstructive pulmonary disease, unspecified: Secondary | ICD-10-CM | POA: Diagnosis not present

## 2021-02-04 DIAGNOSIS — J432 Centrilobular emphysema: Secondary | ICD-10-CM

## 2021-02-04 DIAGNOSIS — J438 Other emphysema: Secondary | ICD-10-CM

## 2021-02-04 DIAGNOSIS — J300 Vasomotor rhinitis: Secondary | ICD-10-CM | POA: Diagnosis not present

## 2021-02-04 DIAGNOSIS — J9611 Chronic respiratory failure with hypoxia: Secondary | ICD-10-CM

## 2021-02-04 MED ORDER — IPRATROPIUM BROMIDE 0.03 % NA SOLN: NASAL | 12 refills | Status: DC

## 2021-02-04 NOTE — Patient Instructions (Signed)
Script sent to try ipratropium nasal spray    1-2 puffs each nostril before meals as needed to prevent sneezing and blowing when you eat  Order- CXR   dx COPD mixed type  Please call if we can help

## 2021-02-04 NOTE — Assessment & Plan Note (Signed)
Meds are appropriate. Didn't like Trelegy dry powder. Plan- continue present meds. Update CXR

## 2021-02-04 NOTE — Assessment & Plan Note (Signed)
Association with meals, consistent with vagal rhinitis common in elderly Plan- try ipratropium nasal spray befofre meals as needed

## 2021-02-04 NOTE — Assessment & Plan Note (Signed)
Continues to benefit from O2 2L for sleep and as needed.

## 2021-02-11 ENCOUNTER — Ambulatory Visit: Payer: Medicare PPO | Admitting: Podiatry

## 2021-02-11 ENCOUNTER — Other Ambulatory Visit: Payer: Self-pay

## 2021-02-11 ENCOUNTER — Encounter: Payer: Self-pay | Admitting: Podiatry

## 2021-02-11 DIAGNOSIS — B351 Tinea unguium: Secondary | ICD-10-CM

## 2021-02-11 DIAGNOSIS — M79675 Pain in left toe(s): Secondary | ICD-10-CM

## 2021-02-11 DIAGNOSIS — M79674 Pain in right toe(s): Secondary | ICD-10-CM

## 2021-02-11 NOTE — Progress Notes (Signed)
  Subjective:  Patient ID: Jeffrey Meadows, male    DOB: 1932-09-29,  MRN: [id number]  Jeffrey Meadows presents to clinic today for painful thick toenails that are difficult to trim. Pain interferes with ambulation. Aggravating factors include wearing enclosed shoe gear. Pain is relieved with periodic professional debridement.  He is accompanied by his wife, Jeffrey Meadows, on today's visit. Wife states he has had a couple of falls and now uses a cane for balance. She also states she purchased the [employer] as suggested.   Jeffrey Meadows states he likes the Skechers and the bottom of his feet feel much better now.   PCP is Baxley, Cresenciano Lick, MD , and last visit was 07/26/2020.  Allergies  Allergen Reactions   Antihistamines, Diphenhydramine-Type Other (See Comments)    Enlarged prostate    Review of Systems: Negative except as noted in the HPI. Objective:   Constitutional Jeffrey Meadows is a pleasant 87 y.o. Caucasian male, WD, WN in NAD. AAO x 3.   Vascular Capillary refill time to digits immediate b/l. Palpable DP pulse(s) right foot Faintly palpable PT pulse(s) left foot. Nonpalpable DP pulse(s) right foot. Nonpalpable PT pulse(s) right foot. Pedal hair present. Lower extremity skin temperature gradient within normal limits. No edema noted b/l lower extremities. No cyanosis or clubbing noted.  Neurologic Normal speech. Oriented to person, place, and time. Protective sensation intact 5/5 intact bilaterally with 10g monofilament b/l. Vibratory sensation intact b/l.  Dermatologic Pedal skin is thin shiny, atrophic b/l lower extremities. Toenails 1-5 b/l elongated, discolored, dystrophic, thickened, crumbly with subungual debris and tenderness to dorsal palpation.  Orthopedic: Normal muscle strength 5/5 to all lower extremity muscle groups bilaterally. No pain crepitus or joint limitation noted with ROM b/l. Plantar fat pad atrophy of forefoot area b/l lower extremities.   Radiographs:  None Assessment:   1. Pain due to onychomycosis of toenails of both feet    Plan:  -Examined patient. -Patient to continue soft, supportive shoe gear daily. -Toenails 1-5 b/l were debrided in length and girth with sterile nail nippers and dremel without iatrogenic bleeding.  -Patient to report any pedal injuries to medical professional immediately. -Patient/POA to call should there be question/concern in the interim.  Return in about 3 months (around 05/14/2021).  Marzetta Board, DPM

## 2021-02-21 DIAGNOSIS — J449 Chronic obstructive pulmonary disease, unspecified: Secondary | ICD-10-CM | POA: Diagnosis not present

## 2021-03-04 NOTE — Progress Notes (Signed)
Cardiology Office Note:    Date:  03/07/2021   ID:  Jeffrey Meadows, DOB [birth year], MRN [id number]  PCP:  Jeffrey Showers, MD  Cardiologist:  None  Electrophysiologist:  None   Referring MD: Jeffrey Showers, MD   Chief complaint: dyspnea   History of Present Illness:    Jeffrey Meadows is a 87 y.o. male with a hx of hypertension, hyperlipidemia, Raynaud's disease, BPH, COPD, PAD who presents for follow-up.  He was referred by Dr. Renold Meadows for evaluation of irregular heart rhythm, 06/22/20.  He reports that he has been doing well.  Denies any palpitations or chest pain.  Does report that he gets short of breath, particular with climbing up stairs.  States that he exercises with bicycle 15 to 20 minutes, in addition to doing push-ups and sit ups, most days of the week.  Denies any symptoms with this.  Does report he becomes lightheaded if standing too quickly.  Denies any syncope.  Smoked 1.5 ppd x 40 years, quit in mid 12s.  Father died of MI in late 60s/early 45s.    Since last clinic visit, he is doing OK. He is accompanied by his wife.  When he walks flights of stairs, he experiences shortness of breath however its relieved by taking a break. He says his breathing has been stable but does feel weaker. He also has LE edema on his ankles. He has been falling asleep more ofte. Denies any lightheadedness, fatigued, syncope, or palpitations. He recently has been using a walking cane to help with his balance.    Past Medical History:  Diagnosis Date   Adenomatous polyp of colon 08/2001   removed hepatic flexure   Arthritis    Cataract    Chronic back pain    stenosis/scoliosis/synovial cyst   Enlarged prostate    takes Proscar daily   History of blood transfusion    no abnormal reaction noted   Hyperlipidemia    takes Atorvastatin daily   Hypertension    takes Amlodipine and HCTZ daily   Joint pain    On home oxygen therapy    at night   Raynaud disease    Urinary frequency    takes  Flomax daily   Villous adenoma of colon 11/.2001   2 cm removed cecum    Past Surgical History:  Procedure Laterality Date   ABDOMINAL AORTOGRAM N/A 08/15/2017   Procedure: ABDOMINAL AORTOGRAM;  Surgeon: Jeffrey Sandy, MD;  Location: [facility];  Service: Cardiovascular;  Laterality: N/A;   cataract surgery     CYSTOSCOPY WITH LITHOLAPAXY N/A 01/11/2016   Procedure: CYSTOSCOPY WITH LITHOLAPAXY;  Surgeon: Jeffrey Aloe, MD;  Location: [facility];  Service: Urology;  Laterality: N/A;   GREEN LIGHT LASER TURP (TRANSURETHRAL RESECTION OF PROSTATE N/A 01/11/2016   Procedure: GREEN LIGHT LASER TURP (TRANSURETHRAL RESECTION OF PROSTATE;  Surgeon: Jeffrey Aloe, MD;  Location: [facility];  Service: Urology;  Laterality: N/A;   HERNIA REPAIR Left    inguinal   HOLMIUM LASER APPLICATION N/A [id number]   Procedure: HOLMIUM LASER APPLICATION;  Surgeon: Jeffrey Aloe, MD;  Location: [facility];  Service: Urology;  Laterality: N/A;   JOINT REPLACEMENT     Right Hip   LAMINECTOMY Right 07/02/2014   Procedure: Right Lumbar two-three Laminectomy for Synovial Cyst;  Surgeon: Jeffrey Levine, MD;  Location: [facility];  Service: Neurosurgery;  Laterality: Right;   LOWER EXTREMITY ANGIOGRAPHY Bilateral 08/15/2017   Procedure: LOWER EXTREMITY ANGIOGRAPHY;  Surgeon: Jeffrey Sandy, MD;  Location: [facility];  Service: Cardiovascular;  Laterality: Bilateral;   POPLITEAL VENOUS ANEURYSM REPAIR Left 2010   about 6 yrs ago   PROSTATE BIOPSY  4/95   SPINE SURGERY  Jan. 2016   Dr. Vickie Meadows   TONSILLECTOMY     TOTAL HIP ARTHROPLASTY  1952   right    Current Medications: Current Meds  Medication Sig   albuterol (PROAIR HFA) 108 (90 Base) MCG/ACT inhaler Inhale 1-2 puffs into the lungs every 4 (four) hours as needed. as needed shortness of breath prior to exercise (Patient taking differently: Inhale 1-2 puffs into the lungs every 4 (four) hours as needed for wheezing or shortness of  breath (shortness of breath prior to exercise).)   AMBULATORY NON FORMULARY MEDICATION Ketoprofen 20% cream   amLODipine (NORVASC) 10 MG tablet TAKE 1 TABLET BY MOUTH EVERY DAY   atorvastatin (LIPITOR) 20 MG tablet TAKE 1 TABLET BY MOUTH EVERY DAY   calcipotriene (DOVONOX) 0.005 % ointment Apply 1 application topically daily. Applies to affected area.   Cholecalciferol (VITAMIN D) 2000 UNITS CAPS Take 2,000 Units by mouth 2 (two) times a week.    finasteride (PROSCAR) 5 MG tablet Take 5 mg by mouth daily.   hydrochlorothiazide (HYDRODIURIL) 25 MG tablet TAKE 1 TABLET BY MOUTH EVERY DAY IN THE MORNING   hydrocortisone cream 1 % Apply twice daily for 4 weeks   ipratropium (ATROVENT) 0.03 % nasal spray 1-2 sprays each nostril before meals as needed   naproxen (NAPROSYN) 500 MG tablet TAKE 1 TABLET BY MOUTH TWICE A DAY WITH FOOD   polyethylene glycol powder (GLYCOLAX/MIRALAX) powder Take 17 g by mouth 2 (two) times daily as needed.   tamsulosin (FLOMAX) 0.4 MG CAPS capsule Take 0.8 mg by mouth daily.    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: [employer]    Employer: RETIRED  Tobacco Use   Smoking status: Former    Packs/day: 1.50    Years: 40.00    Pack years: 60.00    Types: Cigarettes    Quit date: 02/04/1989    Years since quitting: 32.1   Smokeless tobacco: Never   Tobacco comments:    quit smoking about 56yr ago  Substance and Sexual Activity   Alcohol use: Yes    Comment: 3-5 glasses of wine daily, 12-20 glasses a week   Drug use: No   Sexual activity: Not Currently  Other Topics Concern    Not on file  Social History Narrative   Lost license DUI   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   Exercise: Pt reports exercising 4 days a week for 60 minutes.   Seatbelts: Pt reports wearing seatbelt when in vehicles.    SNancy FetterExposure/Protection:  Pt reports not using sun protection.   Hobbies: reading, writing, music               Social Determinants of Health   Financial Resource Strain: Not on file  Food Insecurity: Not on file  Transportation Needs: Not on file  Physical Activity: Not on file  Stress: Not on file  Social Connections: Not on file     Family History: The patient's family history includes Aneurysm in his father; Heart disease in his mother; Heart failure in his mother.  ROS:   Please see the history of present illness.     (+)LE edema, on ankles (+) shortness of breath  (+) sleeping more  All other systems reviewed and are negative.  EKGs/Labs/Other Studies Reviewed:    The following studies were reviewed today: Long term monitor 01/22 Study Highlights   1 episode of NSVT lasting 15 beats. 19 episodes of SVT, longest lasting 20 beats. Occasional PACs (2.4%).   14 days of data recorded on Zio monitor. Patient had a min HR of 55 bpm, max HR of 210 bpm, and avg HR of 78 bpm. Predominant underlying rhythm was Sinus Rhythm. No atrial fibrillation, high degree block, or pauses noted.  There was 1 episode of NSVT lasting 15 beats.  19 episodes of SVT, longest lasting 20 beats.  Isolated ventricular ectopy was rare (<1%).  Occasional PACs (2.4%).  There were 0 triggered events.  Echo 12/21 IMPRESSIONS     1. Normal GLS -16.7. Left ventricular ejection fraction, by estimation,  is 55 to 60%. The left ventricle has normal function.  The left ventricle  has no regional wall motion abnormalities. Left ventricular diastolic  parameters were normal.   2. Right ventricular systolic function is normal. The right ventricular  size is normal. There is normal pulmonary artery systolic pressure.   3. The mitral valve is degenerative. No evidence of mitral valve  regurgitation. No evidence of mitral stenosis. Moderate mitral annular  calcification.   4. Tricuspid valve regurgitation is mild to moderate.   5. Significant sclerosis without stenosis . The aortic valve is  tricuspid. Aortic valve regurgitation is not visualized. Mild to moderate  aortic valve sclerosis/calcification is present, without any evidence of  aortic stenosis.   6. Aortic dilatation noted. There is mild dilatation of the aortic root,  measuring 41 mm.   7. The inferior vena cava is normal in size with greater than 50%  respiratory variability, suggesting right atrial pressure of 3 mmHg.   Long term monitor 11/21 Study Highlights   There was a 2.1 second pause, possibly Mobitz II. No symptoms reported. Occasional PACs (1.1%)   4 days of data recorded on Zio monitor. Patient had a min HR of 29 bpm, max HR of 143 bpm, and avg HR of 80 bpm. Predominant underlying rhythm was Sinus Rhythm. No VT or atrial fibrillation.  There was a 2.9 second pause, possibly Mobitz II.  2 episodes of SVT, longest lasting 9 beats.  Isolated ventricular ectopy was rare (<1%). Isolated atrial ectopy was occasional (1.1%).  There were 0 triggered events.   EKG:   07/22: sinus rhythm, rate 73, poor R wave regression, no ST abnormalities 11/21: normal sinus rhythm, rate 77, no ST/T abnormalities   Recent Labs: 06/03/2020: ALT 16; BUN 13; Creat 0.78; Hemoglobin 15.0; Platelets 284; Potassium 3.6; Sodium 136  Recent Lipid Panel    Component Value Date/Time   CHOL 148 06/03/2020 0949   TRIG 64 06/03/2020 0949  HDL 77 06/03/2020 0949   CHOLHDL 1.9 06/03/2020 0949   VLDL 13  03/13/2017 1034   LDLCALC 57 06/03/2020 0949    Physical Exam:    VS:  BP 130/70   Pulse 73   Resp 18   Ht '5\' 7"'$  (1.702 m)   Wt 139 lb 9.6 oz (63.3 kg)   SpO2 93%   BMI 21.86 kg/m     Wt Readings from Last 3 Encounters:  03/07/21 139 lb 9.6 oz (63.3 kg)  02/04/21 142 lb 12.8 oz (64.8 kg)  07/26/20 142 lb (64.4 kg)     GEN:  Well nourished, well developed in no acute distress HEENT: Normal NECK: No JVD; No carotid bruits LYMPHATICS: No lymphadenopathy CARDIAC: RRR, no murmurs, rubs, gallops RESPIRATORY:  Clear to auscultation without rales, wheezing or rhonchi  ABDOMEN: Soft, non-tender, non-distended MUSCULOSKELETAL:  1+ edema at ankle bilaterally; No deformity  SKIN: Warm and dry NEUROLOGIC:  Alert and oriented x 3 PSYCHIATRIC:  Normal affect   ASSESSMENT:    1. Irregular heart beat   2. SOB (shortness of breath)   3. Essential hypertension   4. Hyperlipidemia, unspecified hyperlipidemia type   5. Aortic dilatation (HCC)     PLAN:    Irregular heart rhythm:  Zio patch x4 days on 06/24/2020 showed a 2.1-second pause, possibly Mobitz 2, no symptoms reported.  Also had occasional PACs (1% of beats).  Repeat Zio patch on one 522 x 14 days showed 1 episode of NSVT lasting 15 beats, 19 episodes of SVT with longest lasting 20 beats, occasional PACs (2.4%), no pauses.  Echocardiogram on 07/16/2020 showed normal biventricular function, no significant valvular disease, mild dilatation of aortic root measuring 41 mm.  Dyspnea: Suspect due to COPD, no structural heart disease on echocardiogram as above  Hypertension: On amlodipine 10 mg daily and hydrochlorothiazide 25 mg daily.  Appears controlled.  Check BMET  Hyperlipidemia: On atorvastatin 20 mg daily.  LDL 57 on 06/03/20  PAD: Status post left SFA to popliteal artery bypass graft in 2006 for aneurysm.  Follows with Dr. Donzetta Matters  Aortic dilatation: Echocardiogram 07/16/2020 showed mild notation of aortic root measuring 41 mm.   Plan CTA for follow-up in 1 year  RTC in 6 months       Medication Adjustments/Labs and Tests Ordered: Current medicines are reviewed at length with the patient today.  Concerns regarding medicines are outlined above.  Orders Placed This Encounter  Procedures   CT ANGIO CHEST AORTA W/CM & OR WO/CM   Basic metabolic panel   Magnesium   TSH   EKG 12-Lead    No orders of the defined types were placed in this encounter.   Patient Instructions  Medication Instructions:  Your physician recommends that you continue on your current medications as directed. Please refer to the Current Medication list given to you today.  *If you need a refill on your cardiac medications before your next appointment, please call your pharmacy*   Lab Work: BMET, Mag, TSH today  If you have labs (blood work) drawn today and your tests are completely normal, you will receive your results only by: [place] (if you have MyChart) OR A paper copy in the mail If you have any lab test that is abnormal or we need to change your treatment, we will call you to review the results.   Testing/Procedures: CTA chest/aorta due in December 2022 --we will call you closer to time to schedule this  Follow-Up: At [facility],  you and your health needs are our priority.  As part of our continuing mission to provide you with exceptional heart care, we have created designated Provider Care Teams.  These Care Teams include your primary Cardiologist (physician) and Advanced Practice Providers (APPs -  Physician Assistants and Nurse Practitioners) who all work together to provide you with the care you need, when you need it.  We recommend signing up for the patient portal called "MyChart".  Sign up information is provided on this After Visit Summary.  MyChart is used to connect with patients for Virtual Visits (Telemedicine).  Patients are able to view lab/test results, encounter notes, upcoming appointments, etc.   Non-urgent messages can be sent to your provider as well.   To learn more about what you can do with MyChart, go to [email or web address].    Your next appointment:   6 month(s)  The format for your next appointment:   In Person  Provider:   Oswaldo Milian, MD     Ardell Isaacs as a scribe for Donato Heinz, MD.,have documented all relevant documentation on the behalf of Donato Heinz, MD,as directed by  Donato Heinz, MD while in the presence of Donato Heinz, MD.  I, Donato Heinz, MD, have reviewed all documentation for this visit. The documentation on 03/07/21 for the exam, diagnosis, procedures, and orders are all accurate and complete.   Signed, Donato Heinz, MD  03/07/2021 11:51 AM    [place]

## 2021-03-07 ENCOUNTER — Encounter: Payer: Self-pay | Admitting: Cardiology

## 2021-03-07 ENCOUNTER — Other Ambulatory Visit: Payer: Self-pay

## 2021-03-07 ENCOUNTER — Ambulatory Visit: Payer: Medicare PPO | Admitting: Cardiology

## 2021-03-07 VITALS — BP 130/70 | HR 73 | Resp 18 | Ht 67.0 in | Wt 139.6 lb

## 2021-03-07 DIAGNOSIS — E785 Hyperlipidemia, unspecified: Secondary | ICD-10-CM | POA: Diagnosis not present

## 2021-03-07 DIAGNOSIS — I77819 Aortic ectasia, unspecified site: Secondary | ICD-10-CM

## 2021-03-07 DIAGNOSIS — R0602 Shortness of breath: Secondary | ICD-10-CM

## 2021-03-07 DIAGNOSIS — I499 Cardiac arrhythmia, unspecified: Secondary | ICD-10-CM | POA: Diagnosis not present

## 2021-03-07 DIAGNOSIS — I10 Essential (primary) hypertension: Secondary | ICD-10-CM | POA: Diagnosis not present

## 2021-03-07 NOTE — Patient Instructions (Signed)
Medication Instructions:  Your physician recommends that you continue on your current medications as directed. Please refer to the Current Medication list given to you today.  *If you need a refill on your cardiac medications before your next appointment, please call your pharmacy*   Lab Work: BMET, Mag, TSH today  If you have labs (blood work) drawn today and your tests are completely normal, you will receive your results only by: [place] (if you have MyChart) OR A paper copy in the mail If you have any lab test that is abnormal or we need to change your treatment, we will call you to review the results.   Testing/Procedures: CTA chest/aorta due in December 2022 --we will call you closer to time to schedule this  Follow-Up: At [facility], you and your health needs are our priority.  As part of our continuing mission to provide you with exceptional heart care, we have created designated Provider Care Teams.  These Care Teams include your primary Cardiologist (physician) and Advanced Practice Providers (APPs -  Physician Assistants and Nurse Practitioners) who all work together to provide you with the care you need, when you need it.  We recommend signing up for the patient portal called "MyChart".  Sign up information is provided on this After Visit Summary.  MyChart is used to connect with patients for Virtual Visits (Telemedicine).  Patients are able to view lab/test results, encounter notes, upcoming appointments, etc.  Non-urgent messages can be sent to your provider as well.   To learn more about what you can do with MyChart, go to [email or web address].    Your next appointment:   6 month(s)  The format for your next appointment:   In Person  Provider:   Oswaldo Milian, MD

## 2021-03-08 LAB — BASIC METABOLIC PANEL
BUN/Creatinine Ratio: 24 (ref 10–24)
BUN: 19 mg/dL (ref 8–27)
CO2: 26 mmol/L (ref 20–29)
Calcium: 10.7 mg/dL — ABNORMAL HIGH (ref 8.6–10.2)
Chloride: 93 mmol/L — ABNORMAL LOW (ref 96–106)
Creatinine, Ser: 0.8 mg/dL (ref 0.76–1.27)
Glucose: 95 mg/dL (ref 65–99)
Potassium: 4.4 mmol/L (ref 3.5–5.2)
Sodium: 140 mmol/L (ref 134–144)
eGFR: 86 mL/min/{1.73_m2} (ref >59)

## 2021-03-08 LAB — MAGNESIUM: Magnesium: 1.8 mg/dL (ref 1.6–2.3)

## 2021-03-08 LAB — TSH: TSH: 2.37 u[IU]/mL (ref 0.450–4.500)

## 2021-03-09 ENCOUNTER — Encounter: Payer: Self-pay | Admitting: *Deleted

## 2021-03-10 DIAGNOSIS — R634 Abnormal weight loss: Secondary | ICD-10-CM | POA: Diagnosis not present

## 2021-03-10 DIAGNOSIS — R21 Rash and other nonspecific skin eruption: Secondary | ICD-10-CM | POA: Diagnosis not present

## 2021-03-10 DIAGNOSIS — R198 Other specified symptoms and signs involving the digestive system and abdomen: Secondary | ICD-10-CM | POA: Diagnosis not present

## 2021-03-11 ENCOUNTER — Other Ambulatory Visit: Payer: Self-pay | Admitting: Physician Assistant

## 2021-03-11 DIAGNOSIS — R197 Diarrhea, unspecified: Secondary | ICD-10-CM

## 2021-03-11 DIAGNOSIS — R634 Abnormal weight loss: Secondary | ICD-10-CM

## 2021-03-11 DIAGNOSIS — K5900 Constipation, unspecified: Secondary | ICD-10-CM

## 2021-03-18 ENCOUNTER — Telehealth: Payer: Self-pay | Admitting: Internal Medicine

## 2021-03-18 NOTE — Telephone Encounter (Signed)
[name] [phone]  [name] called to say that Jeffrey Meadows has a spot on his stomach that is sore to touch, he notice it after his workout yesterday. He wants to come in on Monday if it is still there.

## 2021-03-21 NOTE — Telephone Encounter (Signed)
Problem resolved itself, no longer needs to come in.

## 2021-03-24 DIAGNOSIS — J449 Chronic obstructive pulmonary disease, unspecified: Secondary | ICD-10-CM | POA: Diagnosis not present

## 2021-03-29 ENCOUNTER — Other Ambulatory Visit: Payer: Medicare PPO

## 2021-04-08 ENCOUNTER — Ambulatory Visit: Payer: Medicare PPO | Admitting: Cardiology

## 2021-04-24 DIAGNOSIS — J449 Chronic obstructive pulmonary disease, unspecified: Secondary | ICD-10-CM | POA: Diagnosis not present

## 2021-04-27 DIAGNOSIS — R972 Elevated prostate specific antigen [PSA]: Secondary | ICD-10-CM | POA: Diagnosis not present

## 2021-04-28 ENCOUNTER — Telehealth: Payer: Self-pay | Admitting: Internal Medicine

## 2021-04-28 NOTE — Telephone Encounter (Signed)
Rc Schiraldi [phone]  [name] called to say Akul is not sleeping at night and a friend recommended melatonin and she wanted to know what you think about that. Would it interfere with any of his medications, if alright to take which one should he take.

## 2021-04-28 NOTE — Telephone Encounter (Signed)
Called and left message of what Dr Renold Genta had said.

## 2021-05-04 DIAGNOSIS — R350 Frequency of micturition: Secondary | ICD-10-CM | POA: Diagnosis not present

## 2021-05-04 DIAGNOSIS — R972 Elevated prostate specific antigen [PSA]: Secondary | ICD-10-CM | POA: Diagnosis not present

## 2021-05-04 DIAGNOSIS — N401 Enlarged prostate with lower urinary tract symptoms: Secondary | ICD-10-CM | POA: Diagnosis not present

## 2021-05-05 ENCOUNTER — Telehealth: Payer: Self-pay | Admitting: Internal Medicine

## 2021-05-05 NOTE — Telephone Encounter (Signed)
Confirmed with [name] that they are changing pharmacies!  Received Fax RX request from  [place], [place] Phone:  [phone]  Fax:  [phone]      Medication -  atorvastatin (LIPITOR) 20 MG tablet tamsulosin (FLOMAX) 0.4 MG CAPS capsule naproxen (NAPROSYN) 500 MG tablet  Last Refill -  Last OV - 07/26/20  Last CPE - 06/04/20  Next Appointment - 06/07/21

## 2021-05-06 DIAGNOSIS — E785 Hyperlipidemia, unspecified: Secondary | ICD-10-CM | POA: Diagnosis not present

## 2021-05-06 DIAGNOSIS — J300 Vasomotor rhinitis: Secondary | ICD-10-CM | POA: Diagnosis not present

## 2021-05-06 DIAGNOSIS — G8929 Other chronic pain: Secondary | ICD-10-CM | POA: Diagnosis not present

## 2021-05-06 DIAGNOSIS — I739 Peripheral vascular disease, unspecified: Secondary | ICD-10-CM | POA: Diagnosis not present

## 2021-05-06 DIAGNOSIS — K5900 Constipation, unspecified: Secondary | ICD-10-CM | POA: Diagnosis not present

## 2021-05-06 DIAGNOSIS — I10 Essential (primary) hypertension: Secondary | ICD-10-CM | POA: Diagnosis not present

## 2021-05-06 DIAGNOSIS — J439 Emphysema, unspecified: Secondary | ICD-10-CM | POA: Diagnosis not present

## 2021-05-06 DIAGNOSIS — M1990 Unspecified osteoarthritis, unspecified site: Secondary | ICD-10-CM | POA: Diagnosis not present

## 2021-05-06 DIAGNOSIS — L400 Psoriasis vulgaris: Secondary | ICD-10-CM | POA: Diagnosis not present

## 2021-05-08 ENCOUNTER — Other Ambulatory Visit: Payer: Self-pay | Admitting: Internal Medicine

## 2021-05-09 MED ORDER — ALBUTEROL SULFATE HFA 108 (90 BASE) MCG/ACT IN AERS: 1.0000 | INHALATION_SPRAY | RESPIRATORY_TRACT | 3 refills | Status: AC | PRN

## 2021-05-09 MED ORDER — STIOLTO RESPIMAT 2.5-2.5 MCG/ACT IN AERS: INHALATION_SPRAY | RESPIRATORY_TRACT | 11 refills | Status: DC

## 2021-05-09 MED ORDER — IPRATROPIUM BROMIDE 0.03 % NA SOLN: NASAL | 12 refills | Status: DC

## 2021-05-09 NOTE — Addendum Note (Signed)
Addended by: Dessie Coma on: 05/09/2021 11:34 AM   Modules accepted: Orders

## 2021-05-18 ENCOUNTER — Ambulatory Visit: Payer: Medicare PPO | Admitting: Podiatry

## 2021-05-18 ENCOUNTER — Other Ambulatory Visit: Payer: Self-pay

## 2021-05-18 DIAGNOSIS — M79674 Pain in right toe(s): Secondary | ICD-10-CM | POA: Diagnosis not present

## 2021-05-18 DIAGNOSIS — B351 Tinea unguium: Secondary | ICD-10-CM | POA: Diagnosis not present

## 2021-05-18 DIAGNOSIS — M79675 Pain in left toe(s): Secondary | ICD-10-CM | POA: Diagnosis not present

## 2021-05-23 ENCOUNTER — Encounter: Payer: Self-pay | Admitting: Podiatry

## 2021-05-23 NOTE — Progress Notes (Signed)
  Subjective:  Patient ID: Jeffrey Meadows, male    DOB: 02-Mar-1933,  MRN: [id number]  87 y.o. male presents thick, elongated toenails b/l feet which are tender when wearing enclosed shoe gear.  Patient states left foot feels better since he has been wearing Skechers shoe gear. He is accompanied by his wife on today's visit. She has also purchased herself a pair.   PCP is Baxley, Cresenciano Lick, MD , and last visit was 07/26/2020.Marland Kitchen Allergies  Allergen Reactions   Antihistamines, Diphenhydramine-Type Other (See Comments)    Enlarged prostate    Review of Systems: Negative except as noted in the HPI.   Objective:  Vascular Examination: Vascular status intact b/l with palpable pedal pulses. CFT immediate b/l. No edema. No pain with calf compression b/l. Skin temperature gradient WNL b/l.   Neurological Examination: Sensation grossly intact b/l with 10 gram monofilament. Vibratory sensation intact b/l.   Dermatological Examination: Pedal skin with normal turgor, texture and tone b/l. Toenails 1-5 b/l thick, discolored, elongated with subungual debris and pain on dorsal palpation. No hyperkeratotic lesions noted b/l.   Musculoskeletal Examination: Muscle strength 5/5 to b/l LE.HAV with bunion deformity b/l. Plantar fat pad atrophy of forefoot area b/l. Wearing Skechers loafers which fit adequately.  Radiographs: None Assessment:   1. Pain due to onychomycosis of toenails of both feet    Plan:  -No new findings. No new orders. -Patient to continue soft, supportive shoe gear daily. -Toenails 1-5 b/l were debrided in length and girth with sterile nail nippers and dremel without iatrogenic bleeding.  -Patient to report any pedal injuries to medical professional immediately. -Patient/POA to call should there be question/concern in the interim.  No follow-ups on file.  Marzetta Board, DPM

## 2021-05-24 DIAGNOSIS — J449 Chronic obstructive pulmonary disease, unspecified: Secondary | ICD-10-CM | POA: Diagnosis not present

## 2021-05-26 ENCOUNTER — Telehealth: Payer: Self-pay

## 2021-05-26 NOTE — Telephone Encounter (Signed)
Results have been relayed to the patient. The patient verbalized understanding. No questions at this time.   

## 2021-05-26 NOTE — Telephone Encounter (Signed)
Patients wife would like to know if the patient can take ASA for aches and pains for after workouts since he is not to take tylenol. Please advise.

## 2021-05-26 NOTE — Telephone Encounter (Signed)
She states that it is because he drinks and that it attacks your liver as well.

## 2021-06-06 ENCOUNTER — Other Ambulatory Visit: Payer: Self-pay

## 2021-06-06 ENCOUNTER — Ambulatory Visit (INDEPENDENT_AMBULATORY_CARE_PROVIDER_SITE_OTHER): Payer: Medicare PPO

## 2021-06-06 ENCOUNTER — Other Ambulatory Visit: Payer: Medicare PPO | Admitting: Internal Medicine

## 2021-06-06 DIAGNOSIS — I10 Essential (primary) hypertension: Secondary | ICD-10-CM

## 2021-06-06 DIAGNOSIS — Z125 Encounter for screening for malignant neoplasm of prostate: Secondary | ICD-10-CM

## 2021-06-06 DIAGNOSIS — Z8679 Personal history of other diseases of the circulatory system: Secondary | ICD-10-CM

## 2021-06-06 DIAGNOSIS — N401 Enlarged prostate with lower urinary tract symptoms: Secondary | ICD-10-CM | POA: Diagnosis not present

## 2021-06-06 DIAGNOSIS — Z23 Encounter for immunization: Secondary | ICD-10-CM

## 2021-06-06 DIAGNOSIS — R351 Nocturia: Secondary | ICD-10-CM | POA: Diagnosis not present

## 2021-06-06 DIAGNOSIS — Z0000 Encounter for general adult medical examination without abnormal findings: Secondary | ICD-10-CM | POA: Diagnosis not present

## 2021-06-07 ENCOUNTER — Ambulatory Visit (INDEPENDENT_AMBULATORY_CARE_PROVIDER_SITE_OTHER): Payer: Medicare PPO | Admitting: Internal Medicine

## 2021-06-07 ENCOUNTER — Encounter: Payer: Self-pay | Admitting: Internal Medicine

## 2021-06-07 VITALS — BP 118/60 | HR 80 | Temp 97.8°F | Ht 65.0 in | Wt 136.0 lb

## 2021-06-07 DIAGNOSIS — Z0000 Encounter for general adult medical examination without abnormal findings: Secondary | ICD-10-CM | POA: Diagnosis not present

## 2021-06-07 DIAGNOSIS — N401 Enlarged prostate with lower urinary tract symptoms: Secondary | ICD-10-CM

## 2021-06-07 DIAGNOSIS — G8929 Other chronic pain: Secondary | ICD-10-CM

## 2021-06-07 DIAGNOSIS — J449 Chronic obstructive pulmonary disease, unspecified: Secondary | ICD-10-CM | POA: Diagnosis not present

## 2021-06-07 DIAGNOSIS — R351 Nocturia: Secondary | ICD-10-CM

## 2021-06-07 DIAGNOSIS — G609 Hereditary and idiopathic neuropathy, unspecified: Secondary | ICD-10-CM | POA: Diagnosis not present

## 2021-06-07 DIAGNOSIS — Z8679 Personal history of other diseases of the circulatory system: Secondary | ICD-10-CM

## 2021-06-07 DIAGNOSIS — H6123 Impacted cerumen, bilateral: Secondary | ICD-10-CM | POA: Diagnosis not present

## 2021-06-07 DIAGNOSIS — I10 Essential (primary) hypertension: Secondary | ICD-10-CM | POA: Diagnosis not present

## 2021-06-07 DIAGNOSIS — M25552 Pain in left hip: Secondary | ICD-10-CM

## 2021-06-07 DIAGNOSIS — K436 Other and unspecified ventral hernia with obstruction, without gangrene: Secondary | ICD-10-CM

## 2021-06-07 LAB — CBC WITH DIFFERENTIAL/PLATELET
Absolute Monocytes: 791 cells/uL (ref 200–950)
Basophils Absolute: 103 cells/uL (ref 0–200)
Basophils Relative: 1.2 %
Eosinophils Absolute: 138 cells/uL (ref 15–500)
Eosinophils Relative: 1.6 %
HCT: 40.3 % (ref 38.5–50.0)
Hemoglobin: 13.8 g/dL (ref 13.2–17.1)
Lymphs Abs: 2442 cells/uL (ref 850–3900)
MCH: 32.5 pg (ref 27.0–33.0)
MCHC: 34.2 g/dL (ref 32.0–36.0)
MCV: 95 fL (ref 80.0–100.0)
MPV: 9.3 fL (ref 7.5–12.5)
Monocytes Relative: 9.2 %
Neutro Abs: 5126 cells/uL (ref 1500–7800)
Neutrophils Relative %: 59.6 %
Platelets: 310 10*3/uL (ref 140–400)
RBC: 4.24 10*6/uL (ref 4.20–5.80)
RDW: 12.9 % (ref 11.0–15.0)
Total Lymphocyte: 28.4 %
WBC: 8.6 10*3/uL (ref 3.8–10.8)

## 2021-06-07 LAB — COMPLETE METABOLIC PANEL WITH GFR
AG Ratio: 1.5 (calc) (ref 1.0–2.5)
ALT: 10 U/L (ref 9–46)
AST: 15 U/L (ref 10–35)
Albumin: 3.5 g/dL — ABNORMAL LOW (ref 3.6–5.1)
Alkaline phosphatase (APISO): 62 U/L (ref 35–144)
BUN: 17 mg/dL (ref 7–25)
CO2: 29 mmol/L (ref 20–32)
Calcium: 10.2 mg/dL (ref 8.6–10.3)
Chloride: 98 mmol/L (ref 98–110)
Creat: 0.92 mg/dL (ref 0.70–1.22)
Globulin: 2.4 g/dL (calc) (ref 1.9–3.7)
Glucose, Bld: 91 mg/dL (ref 65–99)
Potassium: 3.7 mmol/L (ref 3.5–5.3)
Sodium: 137 mmol/L (ref 135–146)
Total Bilirubin: 0.7 mg/dL (ref 0.2–1.2)
Total Protein: 5.9 g/dL — ABNORMAL LOW (ref 6.1–8.1)
eGFR: 81 mL/min/{1.73_m2} (ref >=60)

## 2021-06-07 LAB — LIPID PANEL
Cholesterol: 154 mg/dL (ref <200)
HDL: 79 mg/dL (ref >=40)
LDL Cholesterol (Calc): 58 mg/dL (calc)
Non-HDL Cholesterol (Calc): 75 mg/dL (calc) (ref <130)
Total CHOL/HDL Ratio: 1.9 (calc) (ref <5.0)
Triglycerides: 86 mg/dL (ref <150)

## 2021-06-07 LAB — POCT URINALYSIS DIPSTICK
Bilirubin, UA: NEGATIVE
Blood, UA: NEGATIVE
Glucose, UA: NEGATIVE
Ketones, UA: NEGATIVE
Leukocytes, UA: NEGATIVE
Nitrite, UA: NEGATIVE
Protein, UA: POSITIVE — AB
Spec Grav, UA: 1.01 (ref 1.010–1.025)
Urobilinogen, UA: 0.2 E.U./dL
pH, UA: 7.5 (ref 5.0–8.0)

## 2021-06-07 LAB — PSA: PSA: 0.92 ng/mL (ref <=4.00)

## 2021-06-07 MED ORDER — NAPROXEN 500 MG PO TABS: 500.0000 mg | ORAL_TABLET | Freq: Two times a day (BID) | ORAL | 1 refills | Status: DC
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
## 2021-06-22 ENCOUNTER — Other Ambulatory Visit: Payer: Self-pay | Admitting: Internal Medicine

## 2021-06-23 MED ORDER — ATORVASTATIN CALCIUM 20 MG PO TABS: 20.0000 mg | ORAL_TABLET | Freq: Every day | ORAL | 3 refills | Status: DC

## 2021-06-24 DIAGNOSIS — J449 Chronic obstructive pulmonary disease, unspecified: Secondary | ICD-10-CM | POA: Diagnosis not present

## 2021-06-27 ENCOUNTER — Telehealth: Payer: Self-pay | Admitting: Internal Medicine

## 2021-06-27 ENCOUNTER — Other Ambulatory Visit: Payer: Self-pay

## 2021-06-27 ENCOUNTER — Ambulatory Visit
Admission: RE | Admit: 2021-06-27 | Discharge: 2021-06-27 | Disposition: A | Discharge status: Home / Self Care | Payer: Medicare PPO | Source: Ambulatory Visit | Attending: Internal Medicine | Admitting: Internal Medicine

## 2021-06-27 ENCOUNTER — Ambulatory Visit: Payer: Medicare PPO | Admitting: Internal Medicine

## 2021-06-27 ENCOUNTER — Encounter: Payer: Self-pay | Admitting: Internal Medicine

## 2021-06-27 VITALS — BP 112/68 | HR 76 | Temp 97.6°F

## 2021-06-27 DIAGNOSIS — M25531 Pain in right wrist: Secondary | ICD-10-CM

## 2021-06-27 DIAGNOSIS — M1990 Unspecified osteoarthritis, unspecified site: Secondary | ICD-10-CM | POA: Diagnosis not present

## 2021-06-27 DIAGNOSIS — H6123 Impacted cerumen, bilateral: Secondary | ICD-10-CM

## 2021-06-27 DIAGNOSIS — M79641 Pain in right hand: Secondary | ICD-10-CM

## 2021-06-27 MED ORDER — PREDNISONE 10 MG PO TABS: ORAL_TABLET | ORAL | 0 refills | Status: DC

## 2021-06-27 NOTE — Telephone Encounter (Signed)
scheduled

## 2021-06-27 NOTE — Progress Notes (Signed)
   Subjective:    Patient ID: Jeffrey Meadows, male    DOB: [birth year], 87 y.o.   MRN: [id number]  HPI Here today with acute right wrist  pain onset around 2:30 am this morning.It awakened him from sleep. No history of injury. He is right handed.Has been doing some pushups with a trainer recently. He says the pain is fairly severe. No history of gout.  Xray of right hand shows generalized osteopenia and mild osteoarthritis of first CMC joint. Mild osteoarthritis of second DIP joint. Right wrist Xray shows no soft tissue injury or foreign body. No fracture or dislocation.  Have ordered sed rate, RF uric acid, ANA and CBC with diff.   At CPE in October, he was found to have impacted cerumen of ears- refer to ENT for removal.  Review of Systems no fever or chills.  No history of gout.  Onset appeared to be rather sudden waking him from sleep around 2:30 AM.     Objective:   Physical Exam  Blood pressure 112/68 pulse 76 temperature 97.6 degrees pulse oximetry 96%  Examination of right wrist shows some decreased range of motion with erythema.  This extends into the right thumb dorsal aspect with decreased range of motion right PIP joint.      Assessment & Plan:   Acute pain right wrist and right thumb/CMC joint without known injury.  Has been doing push-ups recently but sudden onset would seem to be indicative of an acute process such as a gouty attack or infection.  Labs drawn and pending including CBC, ANA, sed rate, rheumatoid factor, and uric acid.  He will be treated with a short course of prednisone 10 mg tablets starting with 5 tablets day 1 and decreasing by 1 tablet daily i.e. 5-4-3-2-1.  Hold Naprosyn while taking prednisone.  Impacted cerumen of ears noted at time of physical exam in October- refer to ENT for removal

## 2021-06-27 NOTE — Patient Instructions (Addendum)
X-rays of right hand and wrist do not show any occult fracture or osseous abnormality other than osteopenia.  Recommend not doing push-ups for exercise.  Rheumatology studies drawing are pending.  Take prednisone in tapering course as directed.  Referral to ENT physician regarding impacted cerumen.

## 2021-06-27 NOTE — Patient Instructions (Addendum)
It was a pleasure to see you today.  Continue follow-up with Cardiology, Urology Pulmonary, and Vascular surgery as needed.  Referral will be made to ENT regarding bilateral cerumen impaction.  For musculoskeletal pain Naprosyn was prescribed.

## 2021-06-27 NOTE — Telephone Encounter (Signed)
[place] [phone]  [name] called to say that Maki woke up in terrible pain in his right wrist and hand, does not know if he injured it or not. She has done several things and nothing has helped.

## 2021-06-28 ENCOUNTER — Telehealth: Payer: Self-pay | Admitting: Internal Medicine

## 2021-06-28 LAB — URIC ACID: Uric Acid, Serum: 6 mg/dL (ref 4.0–8.0)

## 2021-06-28 NOTE — Telephone Encounter (Signed)
Unable to leave voice mail message as mail box was full. No answer at home. Called to check on patient. Uric acid level is normal but an suspicious this is gout as WBC is elevated at 14,300. Sed rate is 6. ANA is pending. He is currently on a Prednisone taper. Wanted to see if steroids had helped. MJB,MD

## 2021-06-29 LAB — CBC WITH DIFFERENTIAL/PLATELET
Absolute Monocytes: 1058 cells/uL — ABNORMAL HIGH (ref 200–950)
Basophils Absolute: 72 cells/uL (ref 0–200)
Basophils Relative: 0.5 %
Eosinophils Absolute: 43 cells/uL (ref 15–500)
Eosinophils Relative: 0.3 %
HCT: 43.8 % (ref 38.5–50.0)
Hemoglobin: 15.4 g/dL (ref 13.2–17.1)
Lymphs Abs: 1244 cells/uL (ref 850–3900)
MCH: 32.9 pg (ref 27.0–33.0)
MCHC: 35.2 g/dL (ref 32.0–36.0)
MCV: 93.6 fL (ref 80.0–100.0)
MPV: 9.6 fL (ref 7.5–12.5)
Monocytes Relative: 7.4 %
Neutro Abs: 11883 cells/uL — ABNORMAL HIGH (ref 1500–7800)
Neutrophils Relative %: 83.1 %
Platelets: 330 10*3/uL (ref 140–400)
RBC: 4.68 10*6/uL (ref 4.20–5.80)
RDW: 12.8 % (ref 11.0–15.0)
Total Lymphocyte: 8.7 %
WBC: 14.3 10*3/uL — ABNORMAL HIGH (ref 3.8–10.8)

## 2021-06-29 LAB — ANA: Anti Nuclear Antibody (ANA): POSITIVE — AB

## 2021-06-29 LAB — ANTI-NUCLEAR AB-TITER (ANA TITER): ANA Titer 1: 1:40 {titer} — ABNORMAL HIGH

## 2021-06-29 LAB — RHEUMATOID FACTOR: Rheumatoid fact SerPl-aCnc: <14 IU/mL (ref <14)

## 2021-06-29 LAB — SEDIMENTATION RATE: Sed Rate: 6 mm/h (ref 0–20)

## 2021-07-01 ENCOUNTER — Telehealth: Payer: Self-pay

## 2021-07-01 NOTE — Telephone Encounter (Signed)
Patients wife asking about lab results. And asking if you would like him to do another course of prednisone. Please advise.

## 2021-07-04 ENCOUNTER — Other Ambulatory Visit: Payer: Self-pay

## 2021-07-04 MED ORDER — PREDNISONE 10 MG PO TABS: ORAL_TABLET | ORAL | 0 refills | Status: DC

## 2021-07-06 ENCOUNTER — Other Ambulatory Visit: Payer: Self-pay

## 2021-07-06 MED ORDER — HYDROCHLOROTHIAZIDE 25 MG PO TABS: ORAL_TABLET | ORAL | 3 refills | Status: DC

## 2021-07-12 DIAGNOSIS — M79641 Pain in right hand: Secondary | ICD-10-CM | POA: Diagnosis not present

## 2021-07-22 DIAGNOSIS — M25531 Pain in right wrist: Secondary | ICD-10-CM | POA: Diagnosis not present

## 2021-07-22 DIAGNOSIS — S63501A Unspecified sprain of right wrist, initial encounter: Secondary | ICD-10-CM | POA: Diagnosis not present

## 2021-07-27 ENCOUNTER — Ambulatory Visit (HOSPITAL_COMMUNITY)
Admission: RE | Admit: 2021-07-27 | Discharge: 2021-07-27 | Disposition: A | Discharge status: Home / Self Care | Payer: Medicare PPO | Source: Ambulatory Visit | Attending: Cardiology | Admitting: Cardiology

## 2021-07-27 DIAGNOSIS — R911 Solitary pulmonary nodule: Secondary | ICD-10-CM | POA: Diagnosis not present

## 2021-07-27 DIAGNOSIS — I77819 Aortic ectasia, unspecified site: Secondary | ICD-10-CM | POA: Diagnosis not present

## 2021-07-27 LAB — POCT I-STAT CREATININE: Creatinine, Ser: 0.7 mg/dL (ref 0.61–1.24)

## 2021-07-27 MED ORDER — SODIUM CHLORIDE (PF) 0.9 % IJ SOLN
INTRAMUSCULAR | Status: AC
  Filled 2021-07-27: qty 50

## 2021-07-27 MED ORDER — IOHEXOL 350 MG/ML SOLN
100.0000 mL | Freq: Once | INTRAVENOUS | Status: AC | PRN
  Administered 2021-07-27: 11:00:00 100 mL via INTRAVENOUS

## 2021-07-29 ENCOUNTER — Other Ambulatory Visit: Payer: Self-pay

## 2021-07-29 ENCOUNTER — Telehealth: Payer: Self-pay | Admitting: Cardiology

## 2021-07-29 DIAGNOSIS — R911 Solitary pulmonary nodule: Secondary | ICD-10-CM

## 2021-07-29 DIAGNOSIS — K862 Cyst of pancreas: Secondary | ICD-10-CM

## 2021-07-29 NOTE — Telephone Encounter (Signed)
[name] the patient's wife is returning Jenna's call from yesterday in regards to Jeffrey Meadows's CT results.

## 2021-07-29 NOTE — Telephone Encounter (Signed)
Called patient, spoke to wife- okay per DPR.  Gave results.   Reordered noncontrast chest CT for one year, abdomen CT for one year repeats.   Patient wife verbalized understanding, thankful for call back.

## 2021-08-05 DIAGNOSIS — H00015 Hordeolum externum left lower eyelid: Secondary | ICD-10-CM | POA: Diagnosis not present

## 2021-08-09 ENCOUNTER — Other Ambulatory Visit: Payer: Self-pay

## 2021-08-09 MED ORDER — NAPROXEN 500 MG PO TABS: 500.0000 mg | ORAL_TABLET | Freq: Two times a day (BID) | ORAL | 3 refills | Status: DC

## 2021-09-02 ENCOUNTER — Other Ambulatory Visit: Payer: Self-pay

## 2021-09-02 ENCOUNTER — Ambulatory Visit: Payer: Medicare PPO | Admitting: Podiatry

## 2021-09-02 DIAGNOSIS — B351 Tinea unguium: Secondary | ICD-10-CM

## 2021-09-02 DIAGNOSIS — M79675 Pain in left toe(s): Secondary | ICD-10-CM | POA: Diagnosis not present

## 2021-09-02 DIAGNOSIS — M79674 Pain in right toe(s): Secondary | ICD-10-CM

## 2021-09-10 ENCOUNTER — Encounter: Payer: Self-pay | Admitting: Podiatry

## 2021-09-10 NOTE — Progress Notes (Signed)
°  Subjective:  Patient ID: Jeffrey Meadows, male    DOB: 08-04-1933,  MRN: [id number]  Mellody Memos presents to clinic today for painful elongated mycotic toenails 1-5 bilaterally which are tender when wearing enclosed shoe gear. Pain is relieved with periodic professional debridement.  Patient is accompanied by his wife on today's visit. They voice no new pedal issues on today's visit.  PCP is Baxley, Cresenciano Lick, MD , and last visit was 06/27/2021.  Allergies  Allergen Reactions   Antihistamines, Diphenhydramine-Type Other (See Comments)    Enlarged prostate    Review of Systems: Negative except as noted in the HPI. Objective:   Constitutional Jeffrey Meadows is a pleasant 88 y.o. Caucasian male, WD, WN in NAD. AAO x 3.   Vascular CFT immediate b/l LE. Palpable DP/PT pulses b/l LE. Digital hair present b/l. Skin temperature gradient WNL b/l. No pain with calf compression b/l. No edema noted b/l. No cyanosis or clubbing noted b/l LE.  Neurologic Normal speech. Oriented to person, place, and time. Protective sensation intact 5/5 intact bilaterally with 10g monofilament b/l.  Dermatologic Pedal skin warm and supple b/l.  No open wounds b/l. No interdigital macerations. Toenails 1-5 b/l elongated, thickened, discolored with subungual debris. +Tenderness with dorsal palpation of nailplates. No hyperkeratotic nor porokeratotic lesions noted b/l.  Orthopedic: Muscle strength 5/5 to all lower extremity muscle groups bilaterally. HAV with bunion deformity noted b/l LE. Plantar fat pad atrophy of forefoot area b/l lower extremities.   Radiographs: None Assessment:   1. Pain due to onychomycosis of toenails of both feet    Plan:  Patient was evaluated and treated and all questions answered. Consent given for treatment as described below: -Examined patient. -Mycotic toenails 1-5 bilaterally were debrided in length and girth with sterile nail nippers and dremel without incident. -Patient/POA to  call should there be question/concern in the interim.  Return in about 3 months (around 12/01/2021).  Marzetta Board, DPM

## 2021-09-15 DIAGNOSIS — E785 Hyperlipidemia, unspecified: Secondary | ICD-10-CM | POA: Diagnosis not present

## 2021-09-15 DIAGNOSIS — M1990 Unspecified osteoarthritis, unspecified site: Secondary | ICD-10-CM | POA: Diagnosis not present

## 2021-09-15 DIAGNOSIS — I951 Orthostatic hypotension: Secondary | ICD-10-CM | POA: Diagnosis not present

## 2021-09-15 DIAGNOSIS — L309 Dermatitis, unspecified: Secondary | ICD-10-CM | POA: Diagnosis not present

## 2021-09-15 DIAGNOSIS — J309 Allergic rhinitis, unspecified: Secondary | ICD-10-CM | POA: Diagnosis not present

## 2021-09-15 DIAGNOSIS — J439 Emphysema, unspecified: Secondary | ICD-10-CM | POA: Diagnosis not present

## 2021-09-15 DIAGNOSIS — N400 Enlarged prostate without lower urinary tract symptoms: Secondary | ICD-10-CM | POA: Diagnosis not present

## 2021-09-15 DIAGNOSIS — I10 Essential (primary) hypertension: Secondary | ICD-10-CM | POA: Diagnosis not present

## 2021-09-15 DIAGNOSIS — J9610 Chronic respiratory failure, unspecified whether with hypoxia or hypercapnia: Secondary | ICD-10-CM | POA: Diagnosis not present

## 2021-09-16 DIAGNOSIS — H6123 Impacted cerumen, bilateral: Secondary | ICD-10-CM | POA: Insufficient documentation

## 2021-09-16 DIAGNOSIS — J3489 Other specified disorders of nose and nasal sinuses: Secondary | ICD-10-CM | POA: Diagnosis not present

## 2021-09-23 ENCOUNTER — Other Ambulatory Visit: Payer: Self-pay

## 2021-09-23 MED ORDER — AMLODIPINE BESYLATE 10 MG PO TABS: 10.0000 mg | ORAL_TABLET | Freq: Every day | ORAL | 1 refills | Status: DC

## 2021-09-26 IMAGING — DX DG CHEST 2V
2 series · 2 of 2 positions shown · non-contrast
Comparison: 03/19/2019

CLINICAL DATA: COPD.

EXAM:
CHEST - 2 VIEW

[chest pa]
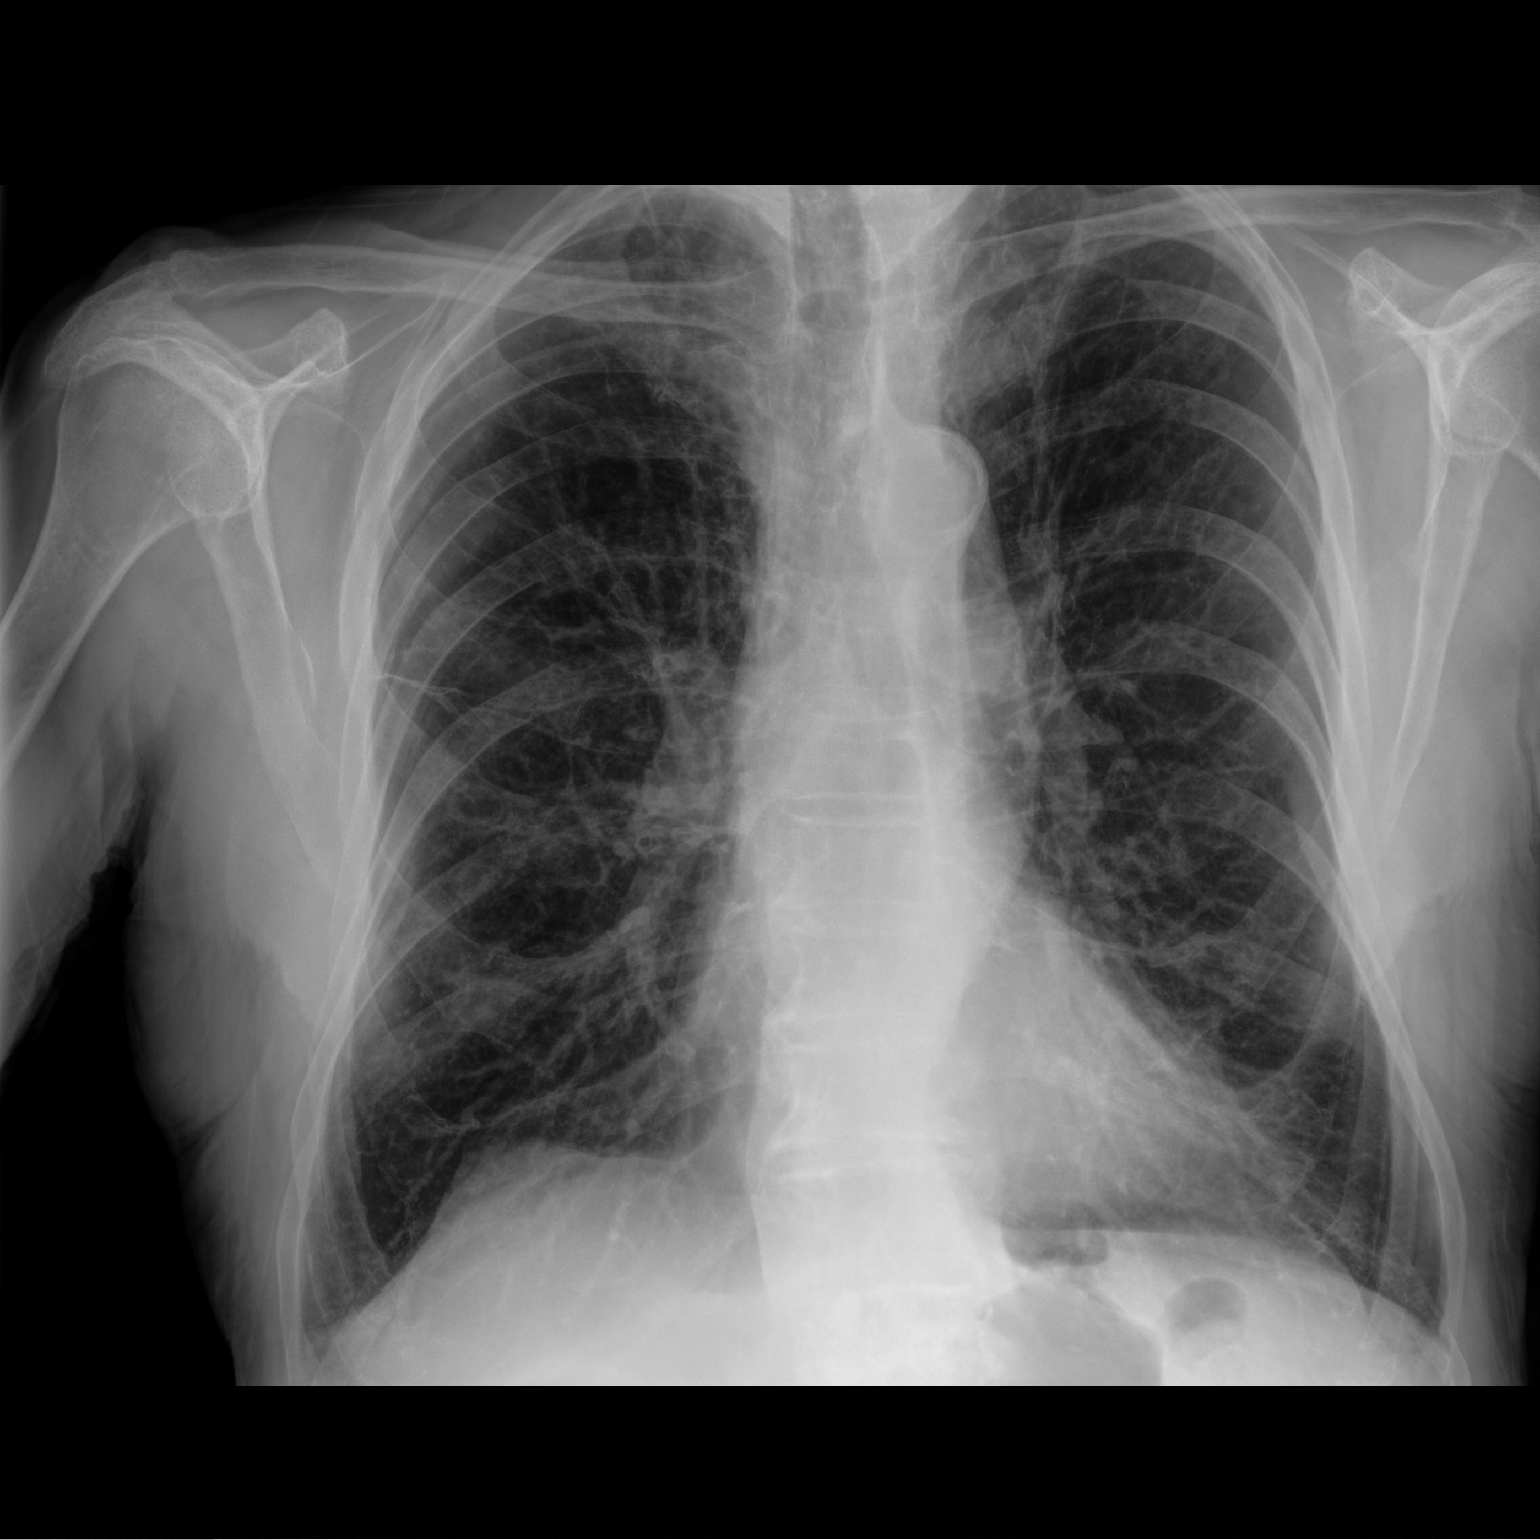

[chest lat]
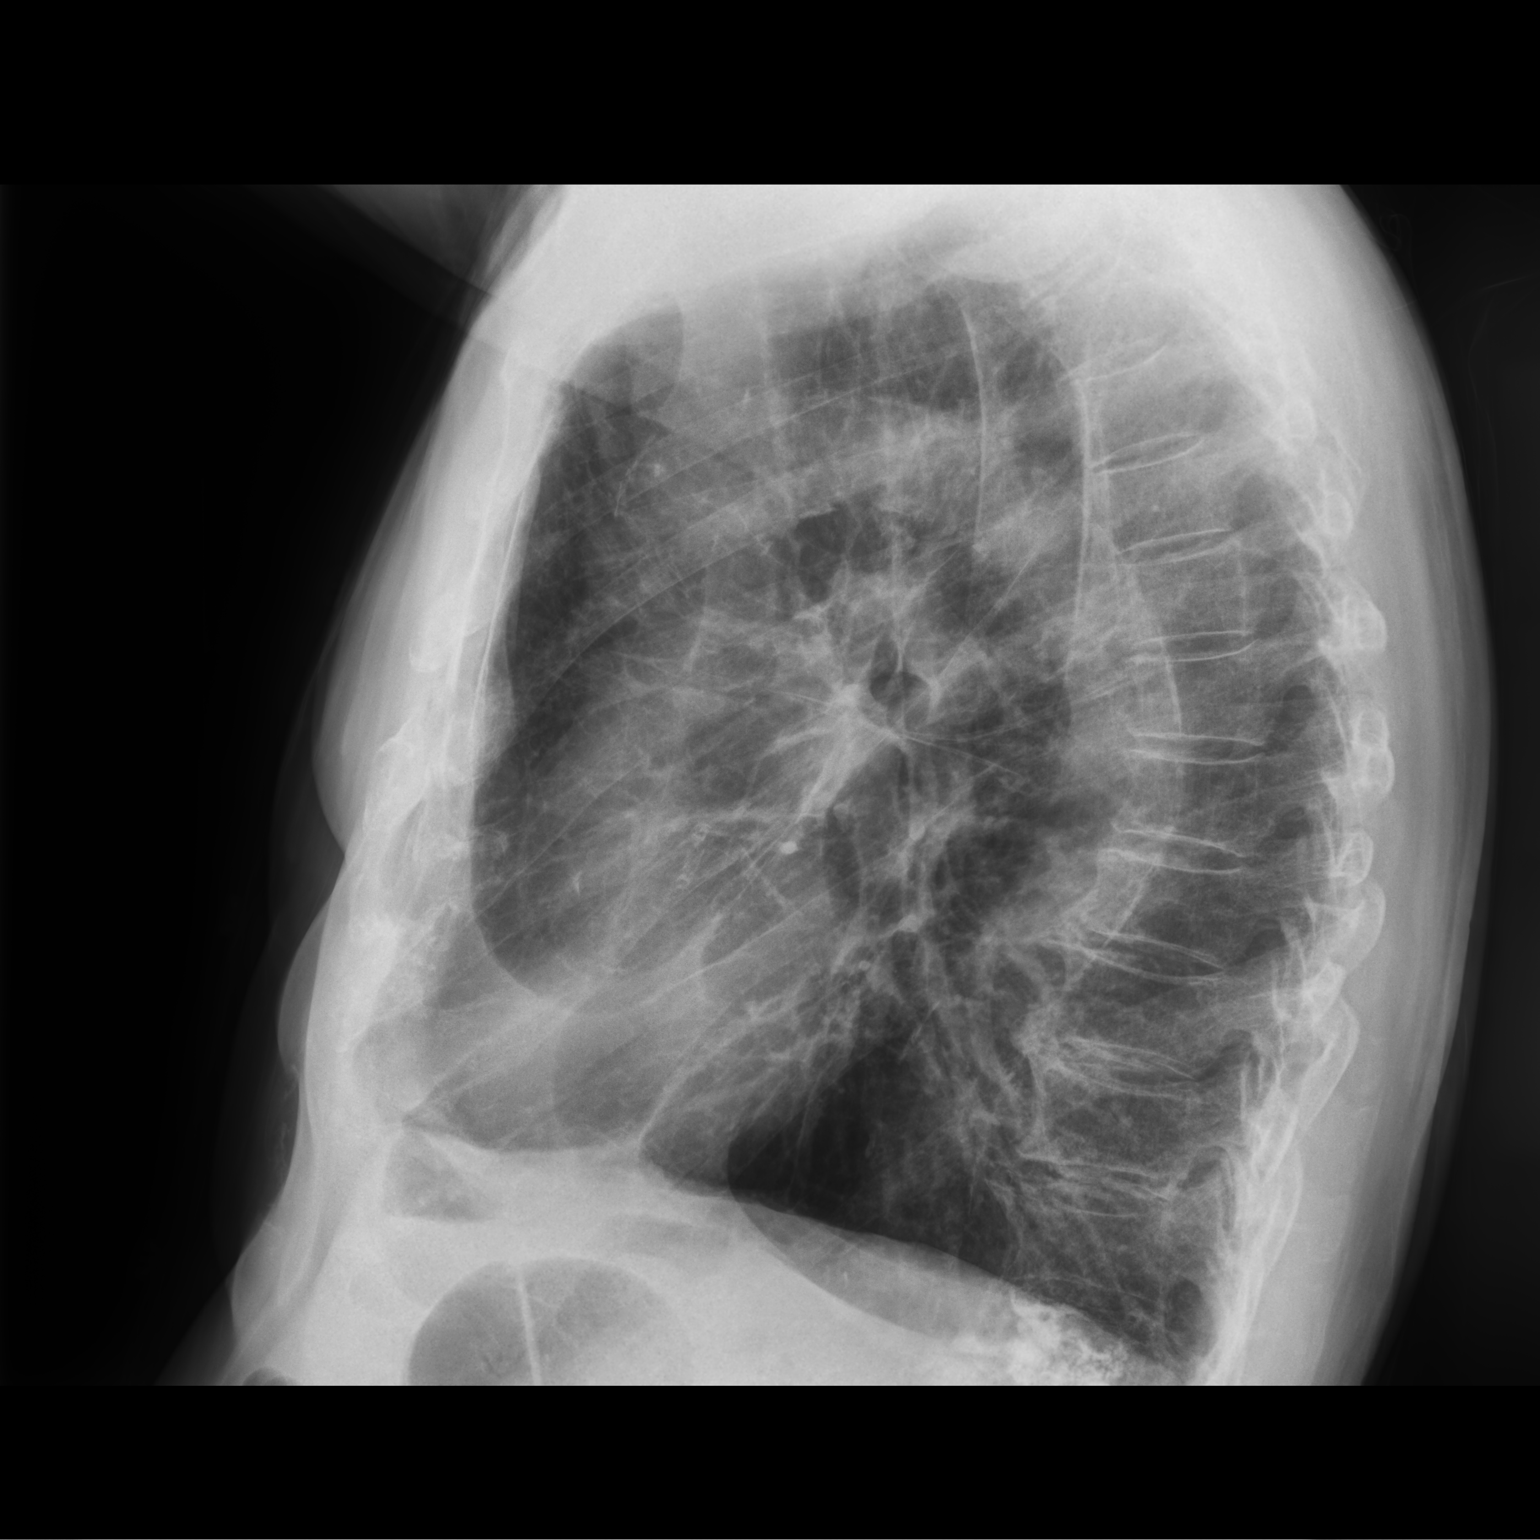

[2 of 2 positions shown; findings below may reference images not displayed]

FINDINGS: Cardiomediastinal contours are within normal limits. Atherosclerotic
calcification of the aortic knob. Hyperexpanded lungs with
emphysematous changes within the lung apices and chronically
coarsened interstitial markings within the perihilar and bibasilar
regions. No superimposed airspace consolidation is identified. No
pleural effusion or pneumothorax. Prior L1 vertebroplasty.
IMPRESSION: COPD.  No superimposed acute cardiopulmonary process.

## 2021-10-18 DIAGNOSIS — R350 Frequency of micturition: Secondary | ICD-10-CM | POA: Diagnosis not present

## 2021-10-18 DIAGNOSIS — N401 Enlarged prostate with lower urinary tract symptoms: Secondary | ICD-10-CM | POA: Diagnosis not present

## 2021-10-18 DIAGNOSIS — R972 Elevated prostate specific antigen [PSA]: Secondary | ICD-10-CM | POA: Diagnosis not present

## 2021-10-20 ENCOUNTER — Ambulatory Visit: Payer: Medicare PPO | Admitting: Cardiology

## 2021-11-01 NOTE — Progress Notes (Deleted)
?Cardiology Office Note:   ? ?Date:  11/01/2021  ? ?ID:  Jeffrey Meadows, DOB June 27, 1933, MRN [id number] ? ?PCP:  Jeffrey Showers, MD  ?Cardiologist:  None  ?Electrophysiologist:  None  ? ?Referring MD: Jeffrey Showers, MD  ? ?Chief complaint: dyspnea ? ? ?History of Present Illness:   ? ?ICHAEL Meadows is a 88 y.o. male with a hx of hypertension, hyperlipidemia, Raynaud's disease, BPH, COPD, PAD who presents for follow-up.  He was referred by Dr. Renold Meadows for evaluation of irregular heart rhythm, 06/22/20.  He reports that he has been doing well.  Denies any palpitations or chest pain.  Does report that he gets short of breath, particular with climbing up stairs.  States that he exercises with bicycle 15 to 20 minutes, in addition to doing push-ups and sit ups, most days of the week.  Denies any symptoms with this.  Does report he becomes lightheaded if standing too quickly.  Denies any syncope.  Smoked 1.5 ppd x 40 years, quit in mid 76s.  Father died of MI in late 60s/early 45s.   ? ?Since last clinic visit,  ?he is doing OK. He is accompanied by his wife.  When he walks flights of stairs, he experiences shortness of breath however its relieved by taking a break. He says his breathing has been stable but does feel weaker. He also has LE edema on his ankles. He has been falling asleep more ofte. Denies any lightheadedness, fatigued, syncope, or palpitations. He recently has been using a walking cane to help with his balance.  ? ? ?Past Medical History:  ?Diagnosis Date  ? Adenomatous polyp of colon 08/2001  ? removed hepatic flexure  ? Arthritis   ? Cataract   ? Chronic back pain   ? stenosis/scoliosis/synovial cyst  ? Enlarged prostate   ? takes Proscar daily  ? History of blood transfusion   ? no abnormal reaction noted  ? Hyperlipidemia   ? takes Atorvastatin daily  ? Hypertension   ? takes Amlodipine and HCTZ daily  ? Joint pain   ? On home oxygen therapy   ? at night  ? Raynaud disease   ? Urinary frequency   ?  takes Flomax daily  ? Villous adenoma of colon 11/.2001  ? 2 cm removed cecum  ? ? ?Past Surgical History:  ?Procedure Laterality Date  ? ABDOMINAL AORTOGRAM N/A 08/15/2017  ? Procedure: ABDOMINAL AORTOGRAM;  Surgeon: Waynetta Sandy, MD;  Location: [facility];  Service: Cardiovascular;  Laterality: N/A;  ? cataract surgery    ? CYSTOSCOPY WITH LITHOLAPAXY N/A 01/11/2016  ? Procedure: CYSTOSCOPY WITH LITHOLAPAXY;  Surgeon: Festus Aloe, MD;  Location: [facility];  Service: Urology;  Laterality: N/A;  ? GREEN LIGHT LASER TURP (TRANSURETHRAL RESECTION OF PROSTATE N/A 01/11/2016  ? Procedure: GREEN LIGHT LASER TURP (TRANSURETHRAL RESECTION OF PROSTATE;  Surgeon: Festus Aloe, MD;  Location: [facility];  Service: Urology;  Laterality: N/A;  ? HERNIA REPAIR Left   ? inguinal  ? HOLMIUM LASER APPLICATION N/A 7/51/7001  ? Procedure: HOLMIUM LASER APPLICATION;  Surgeon: Festus Aloe, MD;  Location: [facility];  Service: Urology;  Laterality: N/A;  ? JOINT REPLACEMENT    ? Right Hip  ? LAMINECTOMY Right 07/02/2014  ? Procedure: Right Lumbar two-three Laminectomy for Synovial Cyst;  Surgeon: Erline Levine, MD;  Location: [facility];  Service: Neurosurgery;  Laterality: Right;  ? LOWER EXTREMITY ANGIOGRAPHY Bilateral 08/15/2017  ? Procedure: LOWER EXTREMITY ANGIOGRAPHY;  Surgeon: Waynetta Sandy, MD;  Location: [facility];  Service: Cardiovascular;  Laterality: Bilateral;  ? POPLITEAL VENOUS ANEURYSM REPAIR Left 2010  ? about 6 yrs ago  ? PROSTATE BIOPSY  4/95  ? SPINE SURGERY  Jan. 2016  ? Dr. Vickie Epley  ? TONSILLECTOMY    ? TOTAL HIP ARTHROPLASTY  1952  ? right  ? ? ?Current Medications: ?No outpatient medications have been marked as taking for the 11/02/21 encounter (Appointment) with Donato Heinz, MD.  ?  ? ?Allergies:   Antihistamines, diphenhydramine-type  ? ?Social History  ? ?Socioeconomic History  ? Marital status: Married  ?  Spouse name: Jeffrey Meadows  ? Number of children: 1  ? Years  of education: 76  ? Highest education level: Not on file  ?Occupational History  ? 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  Employer: [employer]  ?  Employer: RETIRED  ?Tobacco Use  ? Smoking status: Former  ?  Packs/day: 1.50  ?  Years: 40.00  ?  Pack years: 60.00  ?  Types: Cigarettes  ?  Quit date: 02/04/1989  ?  Years since quitting: 32.7  ? Smokeless tobacco: Never  ? Tobacco comments:  ?  quit smoking about 14yr ago  ?Substance and Sexual Activity  ? Alcohol use: Yes  ?  Comment: 3-5 glasses of wine daily, 12-20 glasses a week  ? Drug use: No  ? Sexual activity: Not Currently  ?Other Topics Concern  ? Not on file  ?Social History Narrative  ? Lost license DUI  ? 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   ? Health Care POA:   ? Emergency Contact: wife, SManuela Meadows ? End of Life Plan:   ? Who lives with you: wife  ? Any pets: 3 cats  ? Diet: Pt has a varied diet of protein, starch, and vegetables.  ? Exercise: Pt reports exercising 4 days a week for 60 minutes.  ? Seatbelts: Pt reports wearing seatbelt when in vehicles.   ? SNancy FetterExposure/Protection: Pt reports not using sun protection.  ? Hobbies: reading, writing, music  ?   ?   ?   ?   ? ?Social Determinants of Health  ? ?Financial Resource Strain: Not on file  ?Food Insecurity: Not on file  ?Transportation Needs: Not on file  ?Physical Activity: Not on file  ?Stress: Not on file  ?Social Connections: Not on file  ?  ? ?Family History: ?The patient's family history includes Aneurysm in his father; Heart disease in his mother; Heart failure in his mother. ? ?ROS:   ?Please see the history of present illness.    ? (+)LE edema, on ankles ?(+) shortness of breath  ?(+) sleeping more  ?All other systems reviewed and are negative. ? ?EKGs/Labs/Other Studies Reviewed:   ? ?The following studies were reviewed today: ?Long term monitor 01/22 ?Study Highlights ?  ?1 episode of NSVT lasting 15 beats. ?19 episodes of SVT, longest lasting  20 beats. ?Occasional PACs (2.4%). ?  ?14 days of data recorded on Zio monitor. Patient had a min HR of 55 bpm, max HR of 210 bpm, and avg HR of 78 bpm. Predominant underlying rhythm was Sinus Rhythm. No atrial fibrillation, high degree block, or pauses noted.  There was 1 episode of NSVT lasting 15 beats.  19 episodes of SVT, longest lasting 20 beats.  Isolated ventricular ectopy was rare (<1%).  Occasional PACs (2.4%).  There  were 0 triggered events. ? ?Echo 12/21 ?IMPRESSIONS  ? ? ? 1. Normal GLS -16.7. Left ventricular ejection fraction, by estimation,  ?is 55 to 60%. The left ventricle has normal function. The left ventricle  ?has no regional wall motion abnormalities. Left ventricular diastolic  ?parameters were normal.  ? 2. Right ventricular systolic function is normal. The right ventricular  ?size is normal. There is normal pulmonary artery systolic pressure.  ? 3. The mitral valve is degenerative. No evidence of mitral valve  ?regurgitation. No evidence of mitral stenosis. Moderate mitral annular  ?calcification.  ? 4. Tricuspid valve regurgitation is mild to moderate.  ? 5. Significant sclerosis without stenosis . The aortic valve is  ?tricuspid. Aortic valve regurgitation is not visualized. Mild to moderate  ?aortic valve sclerosis/calcification is present, without any evidence of  ?aortic stenosis.  ? 6. Aortic dilatation noted. There is mild dilatation of the aortic root,  ?measuring 41 mm.  ? 7. The inferior vena cava is normal in size with greater than 50%  ?respiratory variability, suggesting right atrial pressure of 3 mmHg.  ? ?Long term monitor 11/21 ?Study Highlights ?  ?There was a 2.1 second pause, possibly Mobitz II. No symptoms reported. ?Occasional PACs (1.1%) ?  ?4 days of data recorded on Zio monitor. Patient had a min HR of 29 bpm, max HR of 143 bpm, and avg HR of 80 bpm. Predominant underlying rhythm was Sinus Rhythm. No VT or atrial fibrillation.  There was a 2.9 second pause, possibly  Mobitz II.  2 episodes of SVT, longest lasting 9 beats.  Isolated ventricular ectopy was rare (<1%). Isolated atrial ectopy was occasional (1.1%).  There were 0 triggered events. ? ? ?EKG:   ?07/22: sin

## 2021-11-02 ENCOUNTER — Telehealth: Payer: Self-pay | Admitting: Internal Medicine

## 2021-11-02 ENCOUNTER — Ambulatory Visit: Payer: Medicare PPO | Admitting: Cardiology

## 2021-11-02 NOTE — Telephone Encounter (Signed)
Donn Zanetti ?[phone] ? ?Jeffrey Meadows had called the after hours service and I called her back about [name] having severe back pain for 2-3 days, he can hardly walk. She does not feel she can get him down 2 flights of stairs but feels he needs an MRI and needs to be seen and evaluated. I let her know that since she feels he needs to be evaluated and MRI the best thing to do would be to take him to the emergency room. She is unable to get him anywhere herself so she is going to call 911 for assistance since he is in so much pain. ? ?

## 2021-11-03 ENCOUNTER — Telehealth: Payer: Self-pay

## 2021-11-03 NOTE — Telephone Encounter (Signed)
Called [name] back she is giving Diem Tylenol 500 mg and Naproxen it is helping and scheduled appointment. ?

## 2021-11-03 NOTE — Telephone Encounter (Signed)
Patients wife would like to bring patient o be seen tomorrow. He is having back pain 3-4 days and needs a muscle relaxer.  ?

## 2021-11-04 ENCOUNTER — Ambulatory Visit: Payer: Medicare PPO | Admitting: Internal Medicine

## 2021-11-22 ENCOUNTER — Telehealth: Payer: Self-pay | Admitting: Internal Medicine

## 2021-11-22 NOTE — Telephone Encounter (Signed)
Yes it is currently sitting in Dr. Janee Morn sign folder ?

## 2021-11-22 NOTE — Telephone Encounter (Signed)
Jeffrey Meadows, have you seen a form on this patient?  ?

## 2021-11-23 NOTE — Telephone Encounter (Signed)
Form is in my out -basket ?

## 2021-11-23 NOTE — Telephone Encounter (Signed)
Forms have been faxed back to [facility]. Nothing further needed at this time. ?

## 2021-11-23 NOTE — Telephone Encounter (Signed)
Dr. Annamaria Boots once you have completed form please let me know ?

## 2021-12-07 ENCOUNTER — Ambulatory Visit: Payer: Medicare PPO | Admitting: Podiatry

## 2021-12-07 DIAGNOSIS — B351 Tinea unguium: Secondary | ICD-10-CM | POA: Diagnosis not present

## 2021-12-07 DIAGNOSIS — M79675 Pain in left toe(s): Secondary | ICD-10-CM

## 2021-12-07 DIAGNOSIS — M79674 Pain in right toe(s): Secondary | ICD-10-CM

## 2021-12-09 ENCOUNTER — Ambulatory Visit: Payer: Medicare PPO | Admitting: Podiatry

## 2021-12-12 DIAGNOSIS — Z961 Presence of intraocular lens: Secondary | ICD-10-CM | POA: Diagnosis not present

## 2021-12-14 ENCOUNTER — Other Ambulatory Visit: Payer: Self-pay | Admitting: *Deleted

## 2021-12-14 DIAGNOSIS — I739 Peripheral vascular disease, unspecified: Secondary | ICD-10-CM

## 2021-12-15 ENCOUNTER — Encounter: Payer: Self-pay | Admitting: Podiatry

## 2021-12-15 NOTE — Progress Notes (Signed)
?  Subjective:  ?Patient ID: Jeffrey Meadows, male    DOB: 03/19/1933,  MRN: [id number] ? ?Mellody Memos presents to clinic today for painful thick toenails that are difficult to trim. Pain interferes with ambulation. Aggravating factors include wearing enclosed shoe gear. Pain is relieved with periodic professional debridement. ? ?He is accompanied by his wife, [name], on today's visit. Wife states he has been declining in health. He has not had any c/o pain of plantar aspect 1st metatarsal head since he's been wearing his Skechers. ? ?New problem(s): None.  ? ?PCP is [name], MD , and last visit was June 27, 2022. ? ?Allergies  ?Allergen Reactions  ? Antihistamines, Diphenhydramine-Type Other (See Comments)  ?  Enlarged prostate  ? ?Review of Systems: Negative except as noted in the HPI. ? ?Objective: No changes noted in today's physical examination. ? ?Constitutional HUSAIN COSTABILE is a pleasant 88 y.o. Caucasian male, WD, WN in NAD. AAO x 3.   ?Vascular CFT immediate b/l LE. Palpable DP/PT pulses b/l LE. Digital hair present b/l. Skin temperature gradient WNL b/l. No pain with calf compression b/l. No edema noted b/l. No cyanosis or clubbing noted b/l LE.  ?Neurologic Normal speech. Oriented to person, place, and time. Protective sensation intact 5/5 intact bilaterally with 10g monofilament b/l.  ?Dermatologic Pedal skin warm and supple b/l.  No open wounds b/l. No interdigital macerations. Toenails 1-5 b/l elongated, thickened, discolored with subungual debris. +Tenderness with dorsal palpation of nailplates. No hyperkeratotic nor porokeratotic lesions noted b/l.  ?Orthopedic: Muscle strength 5/5 to all lower extremity muscle groups bilaterally. HAV with bunion deformity noted b/l LE. Plantar fat pad atrophy of forefoot area b/l lower extremities.  ? ?Radiographs: None ? ?Assessment/Plan: ?1. Pain due to onychomycosis of toenails of both feet   ? ?-Patient was evaluated and treated. All patient's  and/or POA's questions/concerns answered on today's visit. ?-No new findings. No new orders. ?-Continue soft supportive shoe gear daily. ?-Toenails 1-5 b/l were debrided in length and girth with sterile nail nippers and dremel without iatrogenic bleeding.  ?-Patient/POA to call should there be question/concern in the interim.  ? ?Return in about 3 months (around 03/08/2022). ? ?Marzetta Board, DPM  ?

## 2021-12-21 ENCOUNTER — Encounter: Payer: Self-pay | Admitting: Nurse Practitioner

## 2021-12-21 ENCOUNTER — Ambulatory Visit: Payer: Medicare PPO | Admitting: Nurse Practitioner

## 2021-12-21 ENCOUNTER — Ambulatory Visit (INDEPENDENT_AMBULATORY_CARE_PROVIDER_SITE_OTHER): Payer: Medicare PPO

## 2021-12-21 VITALS — BP 126/60 | HR 84

## 2021-12-21 DIAGNOSIS — R1310 Dysphagia, unspecified: Secondary | ICD-10-CM | POA: Diagnosis not present

## 2021-12-21 DIAGNOSIS — R634 Abnormal weight loss: Secondary | ICD-10-CM | POA: Diagnosis not present

## 2021-12-21 DIAGNOSIS — G4734 Idiopathic sleep related nonobstructive alveolar hypoventilation: Secondary | ICD-10-CM | POA: Diagnosis not present

## 2021-12-21 DIAGNOSIS — J449 Chronic obstructive pulmonary disease, unspecified: Secondary | ICD-10-CM

## 2021-12-21 DIAGNOSIS — Z87891 Personal history of nicotine dependence: Secondary | ICD-10-CM

## 2021-12-21 DIAGNOSIS — J300 Vasomotor rhinitis: Secondary | ICD-10-CM

## 2021-12-21 DIAGNOSIS — J432 Centrilobular emphysema: Secondary | ICD-10-CM | POA: Diagnosis not present

## 2021-12-21 DIAGNOSIS — R059 Cough, unspecified: Secondary | ICD-10-CM | POA: Diagnosis not present

## 2021-12-21 MED ORDER — BREZTRI AEROSPHERE 160-9-4.8 MCG/ACT IN AERO: 2.0000 | INHALATION_SPRAY | Freq: Two times a day (BID) | RESPIRATORY_TRACT | 6 refills | Status: DC

## 2021-12-21 MED ORDER — BREZTRI AEROSPHERE 160-9-4.8 MCG/ACT IN AERO: 2.0000 | INHALATION_SPRAY | Freq: Two times a day (BID) | RESPIRATORY_TRACT | 0 refills | Status: DC

## 2021-12-21 NOTE — Assessment & Plan Note (Signed)
Continues to have difficulties with this.  Never tried Atrovent nasal spray as previously recommended.  Advised that he try this prior to each meal.  They verbalized understanding. ?

## 2021-12-21 NOTE — Assessment & Plan Note (Signed)
Does not consistently wear oxygen at night.  Advised on importance of compliance.  Walking oximetry today without desaturations.  Able to maintain saturations in the 90s on room air. ?

## 2021-12-21 NOTE — Progress Notes (Signed)
Patient seen in the office today and instructed on use of Breztri.  Patient expressed understanding and demonstrated technique.  

## 2021-12-21 NOTE — Patient Instructions (Addendum)
Stop Stiolto. Start Breztri 2 puffs Twice daily. Brush tongue and rinse mouth afterwards  ?Continue Albuterol inhaler 2 puffs or 3 mL neb every 6 hours as needed for shortness of breath or wheezing. Notify if symptoms persist despite rescue inhaler/neb use. ?Continue atrovent nasal spray 1-2 sprays each nostril prior to each meal  ? ?You need to make sure you are wearing your oxygen every night, all night 2 lpm. ? ?Chest x ray today.  ? ?CT chest with contrast. Someone will contact you for scheduling.  ? ?Referral to GI for further evaluation ? ?Follow up in one month with Dr. Annamaria Boots or Alanson Aly. If symptoms do not improve or worsen, please contact office for sooner follow up or seek emergency care. ?

## 2021-12-21 NOTE — Assessment & Plan Note (Signed)
Concern given his weight loss, anorexia and fatigue with significant smoking history.  Will obtain CT chest with contrast for further evaluation.  He also has some difficulties with swallowing and coughs frequently during eating.  Referral sent to GI for further evaluation. ?

## 2021-12-21 NOTE — Progress Notes (Signed)
? ?'@Patient'$  ID: Jeffrey Meadows, male    DOB: 07/09/1933, 88 y.o.   MRN: [id number] ? ?Chief Complaint  ?Patient presents with  ? Follow-up  ?  Sleeps a lot.  Floods mucous while eating.  No appetite.  Gulps air while eating.  Coughs while eating.  Seems to have to choose between eating and breathing.  ? ? ?Referring provider: ?[name], MD ? ?HPI: ?88 year old male, former smoker (60 pack years) followed for COPD with emphysema, nocturnal hypoxemia and chronic loculated right pneumothorax. He is a patient of Dr. Janee Morn and last seen in office 02/04/2021. Past medical history significant for HTN, PVD, allergic rhinitis, nasal septal deviation, essential tremor, psoriasis, OA, BPH, HLD, chronic alcoholism.  ? ?TEST/EVENTS:  ?10/04/2012 PFTs: FVC 77, FEV1 44, ratio 37, TLC 115, DLCO corrected for alveolar volume 52.  Severe obstructive airway disease with severe diffusion defect.  No significant BD ?07/16/2020 echocardiogram: EF 55-60%.  Diastolic parameters normal.  RV size and function normal with normal PASP.  TR mild to moderate.  Significant AV sclerosis without stenosis. ?07/27/2021 CTA chest aorta: No evidence of PE.  There is paraseptal and centrilobular emphysema.  Scarring at the bilateral apices.  There is a 4 mm nodule of the right upper lobe.  There is atherosclerosis and CAD.  There is no thoracic aortic aneurysm. ? ?02/04/2021: OV with Dr. Annamaria Boots.  Breathing overall stable.  Reported that when he eats he sneezes and his nose runs; suspected to possibly be vagal rhinitis.  Advised him to try Atrovent nasal spray before meals.  Encouraged him to be compliant with his nocturnal oxygen 2 L/min.  Continued on Stiolto.  Had tried Trelegy in the past but did not like the powder. ? ?12/21/2021: Today-acute visit ?Patient presents today with wife for concerns with sleeping a lot and coughing while eating.  They report that his breathing has progressively declined over the last 6 months; activity tolerance is not  as good as it used to be.  He does have a chronic productive cough, which has increased slightly over the last few months.  Describes sputum as white to yellow at times.  His wife reports that he sleeps frequently throughout the day.  He also has not been eating as much and has no appetite.  She feels like his nose constantly runs when he is eating which he was previously was advised to use Atrovent for but hasn't.  He also coughs and clears his throat frequently during eating; has some difficulty with swallowing because of all of this.  He also has had some unintentional weight loss of around 10 pounds.  He denies any hemoptysis, night sweats, fevers, lower extremity edema. He continues on [employer] daily. Rarely uses his albuterol. He does not use his oxygen consistently at night. He does seem like he also has some memory impairment, which wife agrees has been ongoing for a while now.  ? ?Allergies  ?Allergen Reactions  ? Antihistamines, Diphenhydramine-Type Other (See Comments)  ?  Enlarged prostate  ? ? ?Immunization History  ?Administered Date(s) Administered  ? Fluad Quad(high Dose 65+) 04/12/2019  ? Influenza Split 04/28/2011, 05/17/2012  ? Influenza Whole 05/29/2007, 05/13/2008, 06/01/2009, 06/07/2010  ? Influenza,inj,Quad PF,6+ Mos 04/11/2013, 05/01/2014, 05/14/2015, 05/26/2016, 04/26/2017, 04/28/2020, 06/06/2021  ? Influenza-Unspecified 05/07/2018  ? PFIZER Comirnaty(Gray Top)Covid-19 Tri-Sucrose Vaccine 12/16/2020  ? PFIZER(Purple Top)SARS-COV-2 Vaccination 09/04/2019, 09/25/2019, 04/18/2020  ? Pension scheme manager 19yr & up 07/26/2021  ? Pneumococcal Conjugate-13 02/17/2015  ? Pneumococcal Polysaccharide-23  11/13/1999, 08/14/2010  ? Td 02/11/2006  ? Tdap 04/28/2011  ? Zoster, Live 09/20/2006  ? ? ?Past Medical History:  ?Diagnosis Date  ? Adenomatous polyp of colon 08/2001  ? removed hepatic flexure  ? Arthritis   ? Cataract   ? Chronic back pain   ? stenosis/scoliosis/synovial cyst  ?  Enlarged prostate   ? takes Proscar daily  ? History of blood transfusion   ? no abnormal reaction noted  ? Hyperlipidemia   ? takes Atorvastatin daily  ? Hypertension   ? takes Amlodipine and HCTZ daily  ? Joint pain   ? On home oxygen therapy   ? at night  ? Raynaud disease   ? Urinary frequency   ? takes Flomax daily  ? Villous adenoma of colon 11/.2001  ? 2 cm removed cecum  ? ? ?Tobacco History: ?Social History  ? ?Tobacco Use  ?Smoking Status Former  ? Packs/day: 1.50  ? Years: 40.00  ? Pack years: 60.00  ? Types: Cigarettes  ? Quit date: 02/04/1989  ? Years since quitting: 32.8  ?Smokeless Tobacco Never  ?Tobacco Comments  ? quit smoking about 35yr ago  ? ?Counseling given: Not Answered ?Tobacco comments: quit smoking about 329yrago ? ? ?Outpatient Medications Prior to Visit  ?Medication Sig Dispense Refill  ? acetaminophen (TYLENOL) 500 MG tablet Take 500 mg by mouth every 6 (six) hours as needed.    ? albuterol (PROAIR HFA) 108 (90 Base) MCG/ACT inhaler Inhale 1-2 puffs into the lungs every 4 (four) hours as needed. as needed shortness of breath prior to exercise 54 g 3  ? amLODipine (NORVASC) 10 MG tablet Take 1 tablet (10 mg total) by mouth daily. 90 tablet 1  ? amLODipine-atorvastatin (CADUET) 5-20 MG tablet 1 tab    ? atorvastatin (LIPITOR) 20 MG tablet Take 1 tablet (20 mg total) by mouth daily. 90 tablet 3  ? calcipotriene (DOVONOX) 0.005 % ointment calcipotriene 0.005 % topical ointment ? APPLY TO AFFECTED AREAS TWICE DAILY    ? Cholecalciferol (VITAMIN D) 2000 UNITS CAPS Take 2,000 Units by mouth 2 (two) times a week.     ? finasteride (PROSCAR) 5 MG tablet Take 5 mg by mouth daily.  3  ? hydrochlorothiazide (HYDRODIURIL) 25 MG tablet TAKE 1 TABLET BY MOUTH EVERY DAY IN THE MORNING 90 tablet 3  ? hydrocortisone cream 1 % Apply twice daily for 4 weeks 120 g 0  ? ipratropium (ATROVENT) 0.03 % nasal spray 1-2 sprays each nostril before meals as needed 30 mL 12  ? naproxen (NAPROSYN) 500 MG tablet Take  1 tablet (500 mg total) by mouth 2 (two) times daily with a meal. 180 tablet 3  ? tamsulosin (FLOMAX) 0.4 MG CAPS capsule 1 capsule 30 minutes after the same meal each day    ? triamcinolone cream (KENALOG) 0.1 % triamcinolone acetonide 0.1 % topical cream ? TAKE 1 APPLICATION APPLY ON THE SKIN AS DIRECTED TWICE A DAY X 2 WEEKS TO DERMATITIS ON BACK    ? Tiotropium Bromide-Olodaterol (STIOLTO RESPIMAT) 2.5-2.5 MCG/ACT AERS INHALE 2 PUFFS BY MOUTH INTO THE LUNGS DAILY 4 g 11  ? azithromycin (ZITHROMAX) 250 MG tablet Take by mouth. (Patient not taking: Reported on 12/21/2021)    ? predniSONE (DELTASONE) 10 MG tablet Take in tapering course today as directed 5-4-3-2-1 (Patient not taking: Reported on 12/21/2021) 15 tablet 0  ? ?No facility-administered medications prior to visit.  ? ? ? ?Review of Systems:  ? ?Constitutional: No  night sweats, fevers, chills. +fatigue, sleeps frequently throughout the day, weight loss ?HEENT: No headaches, tooth/dental problems, or sore throat. No sneezing, itching, ear ache, nasal congestion, or post nasal drip. +difficulty swallowing, frequent coughing/throat clearing with eating, nasal drainage with eating.  ?CV:  No chest pain, orthopnea, PND, swelling in lower extremities, anasarca, dizziness, palpitations, syncope ?Resp: +shortness of breath with exertion (progressive); productive chronic cough. No excess mucus or change in color of mucus. No productive or non-productive. No hemoptysis. No wheezing.  No chest wall deformity ?GI:  +no appetite. No heartburn, indigestion, abdominal pain, nausea, vomiting, diarrhea, change in bowel habits, bloody stools.  ?GU: No dysuria, change in color of urine, urgency or frequency.  No flank pain, no hematuria  ?Skin: No rash, lesions, ulcerations ?MSK:  No joint pain or swelling.  No decreased range of motion.  No back pain. ?Neuro: No dizziness or lightheadedness. +Memory impairment ?Psych: No depression or anxiety. Mood stable.  ? ? ? ?Physical  Exam: ? ?BP 126/60 (BP Location: Right Arm, Cuff Size: Normal)   Pulse 84   SpO2 96%  ? ?GEN: Pleasant, interactive,chronically-ill appearing; elderly; in no acute distress. ?HEENT:  Normocephalic a

## 2021-12-21 NOTE — Progress Notes (Signed)
Please notify patient his CXR did not show any evidence of superimposed infection. He has findings consistent with COPD and chronic bronchitis. Continue our plan as discussed earlier. Thanks.

## 2021-12-21 NOTE — Assessment & Plan Note (Signed)
Progressive DOE.  Does have a chronic productive cough, which has increased some over the past few months as well.  Given this and his throat clearing with eating we will check CXR today.  Advised stepping up to triple therapy with Breztri.  Provided with samples today and Rx sent to pharmacy.  These do not seem to be acute symptoms advised that we hold off on prednisone or antibiotics unless evidence of infection on CXR.  Continue albuterol as needed. ? ?Patient Instructions  ?Stop Stiolto. Start Breztri 2 puffs Twice daily. Brush tongue and rinse mouth afterwards  ?Continue Albuterol inhaler 2 puffs or 3 mL neb every 6 hours as needed for shortness of breath or wheezing. Notify if symptoms persist despite rescue inhaler/neb use. ?Continue atrovent nasal spray 1-2 sprays each nostril prior to each meal  ? ?You need to make sure you are wearing your oxygen every night, all night 2 lpm. ? ?Chest x ray today.  ? ?CT chest with contrast. Someone will contact you for scheduling.  ? ?Referral to GI for further evaluation ? ?Follow up in one month with Dr. Annamaria Boots or Alanson Aly. If symptoms do not improve or worsen, please contact office for sooner follow up or seek emergency care. ? ? ?

## 2021-12-22 NOTE — Progress Notes (Signed)
Called and spoke with patient's wife (she was with him during his visit on 12/21/21)and provided results/recommendation per Roxan Diesel NP.  She verbalized understanding.  Nothing further needed.

## 2021-12-23 ENCOUNTER — Other Ambulatory Visit: Payer: Self-pay | Admitting: Internal Medicine

## 2021-12-27 ENCOUNTER — Encounter: Payer: Self-pay | Admitting: Gastroenterology

## 2021-12-27 ENCOUNTER — Other Ambulatory Visit: Payer: Self-pay | Admitting: Internal Medicine

## 2021-12-28 ENCOUNTER — Ambulatory Visit (INDEPENDENT_AMBULATORY_CARE_PROVIDER_SITE_OTHER)
Admission: RE | Admit: 2021-12-28 | Discharge: 2021-12-28 | Disposition: A | Discharge status: Home / Self Care | Payer: Medicare PPO | Source: Ambulatory Visit | Attending: Vascular Surgery | Admitting: Vascular Surgery

## 2021-12-28 ENCOUNTER — Ambulatory Visit (HOSPITAL_COMMUNITY)
Admission: RE | Admit: 2021-12-28 | Discharge: 2021-12-28 | Disposition: A | Discharge status: Home / Self Care | Payer: Medicare PPO | Source: Ambulatory Visit | Attending: Vascular Surgery | Admitting: Vascular Surgery

## 2021-12-28 ENCOUNTER — Ambulatory Visit: Payer: Medicare PPO | Admitting: Physician Assistant

## 2021-12-28 VITALS — BP 144/76 | HR 78 | Temp 98.2°F | Resp 20 | Ht 65.0 in | Wt 131.0 lb

## 2021-12-28 DIAGNOSIS — I739 Peripheral vascular disease, unspecified: Secondary | ICD-10-CM

## 2021-12-28 NOTE — Progress Notes (Signed)
?Office Note  ? ? ? ?CC:  follow up ?Requesting Provider:  [name], MD ? ?HPI: Jeffrey Meadows is a 88 y.o. 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  As part of surveillance he was found to have low flow velocities throughout the bypass and thus underwent angiography which was negative for any flow-limiting stenosis.  He was lost to follow-up for a period of time and now presents for repeat surveillance.  He denies any claudication, rest pain, or nonhealing wounds of bilateral lower extremities.  Patient states he does not do much walking and is limited by right hip pain after hip replacement as well as balance.  He is ambulatory with a cane.  He is a former smoker.  He is on aspirin and statin daily. ? ?Past Medical History:  ?Diagnosis Date  ? Adenomatous polyp of colon 08/2001  ? removed hepatic flexure  ? Arthritis   ? Cataract   ? Chronic back pain   ? stenosis/scoliosis/synovial cyst  ? Enlarged prostate   ? takes Proscar daily  ? History of blood transfusion   ? no abnormal reaction noted  ? Hyperlipidemia   ? takes Atorvastatin daily  ? Hypertension   ? takes Amlodipine and HCTZ daily  ? Joint pain   ? On home oxygen therapy   ? at night  ? Raynaud disease   ? Urinary frequency   ? takes Flomax daily  ? Villous adenoma of colon 11/.2001  ? 2 cm removed cecum  ? ? ?Past Surgical History:  ?Procedure Laterality Date  ? ABDOMINAL AORTOGRAM N/A 08/15/2017  ? Procedure: ABDOMINAL AORTOGRAM;  Surgeon: Waynetta Sandy, MD;  Location: [place];  Service: Cardiovascular;  Laterality: N/A;  ? cataract surgery    ? CYSTOSCOPY WITH LITHOLAPAXY N/A 01/11/2016  ? Procedure: CYSTOSCOPY WITH LITHOLAPAXY;  Surgeon: Festus Aloe, MD;  Location: [facility];  Service: Urology;  Laterality: N/A;  ? GREEN LIGHT LASER TURP (TRANSURETHRAL RESECTION OF PROSTATE N/A 01/11/2016  ?  Procedure: GREEN LIGHT LASER TURP (TRANSURETHRAL RESECTION OF PROSTATE;  Surgeon: Festus Aloe, MD;  Location: [facility];  Service: Urology;  Laterality: N/A;  ? HERNIA REPAIR Left   ? inguinal  ? HOLMIUM LASER APPLICATION N/A 5/57/3220  ? Procedure: HOLMIUM LASER APPLICATION;  Surgeon: Festus Aloe, MD;  Location: [facility];  Service: Urology;  Laterality: N/A;  ? JOINT REPLACEMENT    ? Right Hip  ? LAMINECTOMY Right 07/02/2014  ? Procedure: Right Lumbar two-three Laminectomy for Synovial Cyst;  Surgeon: Erline Levine, MD;  Location: [facility];  Service: Neurosurgery;  Laterality: Right;  ? LOWER EXTREMITY ANGIOGRAPHY Bilateral 08/15/2017  ? Procedure: LOWER EXTREMITY ANGIOGRAPHY;  Surgeon: Waynetta Sandy, MD;  Location: [facility];  Service: Cardiovascular;  Laterality: Bilateral;  ? POPLITEAL VENOUS ANEURYSM REPAIR Left 2010  ? about 6 yrs ago  ? PROSTATE BIOPSY  4/95  ? SPINE SURGERY  Jan. 2016  ? Dr. Vickie Epley  ? TONSILLECTOMY    ? TOTAL HIP ARTHROPLASTY  1952  ? right  ? ? ?Social History  ? ?Socioeconomic History  ? Marital status: Married  ?  Spouse name: Jeffrey Meadows  ? Number of children: 1  ? Years of education: 39  ? Highest education level: Not on file  ?Occupational History  ? 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  Employer: [employer]  ?  Employer: RETIRED  ?Tobacco Use  ? Smoking status: Former  ?  Packs/day: 1.50  ?  Years: 40.00  ?  Pack years: 60.00  ?  Types: Cigarettes  ?  Quit date: 02/04/1989  ?  Years since quitting: 32.9  ?  Passive exposure: Never  ? Smokeless tobacco: Never  ? Tobacco comments:  ?  quit smoking about 31yr ago  ?Substance and Sexual Activity  ? Alcohol use: Yes  ?  Comment: 3-5 glasses of wine daily, 12-20 glasses a week  ? Drug use: No  ? Sexual activity: Not Currently  ?Other Topics Concern  ? Not on file  ?Social History Narrative  ? Lost license DUI  ? 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   ? Health Care POA:    ? Emergency Contact: wife, Jeffrey Meadows ? End of Life Plan:   ? Who lives with you: wife  ? Any pets: 3 cats  ? Diet: Pt has a varied diet of protein, starch, and vegetables.  ? Exercise: Pt reports exercising 4 days a week for 60 minutes.  ? Seatbelts: Pt reports wearing seatbelt when in vehicles.   ? SNancy FetterExposure/Protection: Pt reports not using sun protection.  ? Hobbies: reading, writing, music  ?   ?   ?   ?   ? ?Social Determinants of Health  ? ?Financial Resource Strain: Not on file  ?Food Insecurity: Not on file  ?Transportation Needs: Not on file  ?Physical Activity: Not on file  ?Stress: Not on file  ?Social Connections: Not on file  ?Intimate Partner Violence: Not on file  ? ? ?Family History  ?Problem Relation Age of Onset  ? Heart failure Mother   ? Heart disease Mother   ? Aneurysm Father   ? ? ?Current Outpatient Medications  ?Medication Sig Dispense Refill  ? acetaminophen (TYLENOL) 500 MG tablet Take 500 mg by mouth every 6 (six) hours as needed.    ? albuterol (PROAIR HFA) 108 (90 Base) MCG/ACT inhaler Inhale 1-2 puffs into the lungs every 4 (four) hours as needed. as needed shortness of breath prior to exercise 54 g 3  ? amLODipine (NORVASC) 10 MG tablet Take 1 tablet (10 mg total) by mouth daily. 90 tablet 1  ? amLODipine-atorvastatin (CADUET) 5-20 MG tablet 1 tab    ? atorvastatin (LIPITOR) 20 MG tablet Take 1 tablet (20 mg total) by mouth daily. 90 tablet 3  ? benazepril-hydrochlorthiazide (LOTENSIN HCT) 20-25 MG tablet     ? Budeson-Glycopyrrol-Formoterol (BREZTRI AEROSPHERE) 160-9-4.8 MCG/ACT AERO Inhale 2 puffs into the lungs in the morning and at bedtime. 10.7 g 6  ? Budeson-Glycopyrrol-Formoterol (BREZTRI AEROSPHERE) 160-9-4.8 MCG/ACT AERO Inhale 2 puffs into the lungs in the morning and at bedtime. 5.9 g 0  ? calcipotriene (DOVONOX) 0.005 % ointment calcipotriene 0.005 % topical ointment ? APPLY TO AFFECTED AREAS TWICE DAILY    ? Cholecalciferol (VITAMIN D) 2000 UNITS CAPS Take 2,000 Units by  mouth 2 (two) times a week.     ? finasteride (PROSCAR) 5 MG tablet Take 5 mg by mouth daily.  3  ? hydrochlorothiazide (HYDRODIURIL) 25 MG tablet TAKE 1 TABLET BY MOUTH EVERY DAY IN THE MORNING 90 tablet 3  ? hydrocortisone cream 1 % Apply twice daily for 4 weeks 120 g 0  ? ipratropium (ATROVENT) 0.03 % nasal spray 1-2 sprays each nostril before meals as needed 30 mL 12  ?  naproxen (NAPROSYN) 500 MG tablet Take 1 tablet (500 mg total) by mouth 2 (two) times daily with a meal. 180 tablet 3  ? tamsulosin (FLOMAX) 0.4 MG CAPS capsule 1 capsule 30 minutes after the same meal each day    ? triamcinolone cream (KENALOG) 0.1 % triamcinolone acetonide 0.1 % topical cream ? TAKE 1 APPLICATION APPLY ON THE SKIN AS DIRECTED TWICE A DAY X 2 WEEKS TO DERMATITIS ON BACK    ? ?No current facility-administered medications for this visit.  ? ? ?Allergies  ?Allergen Reactions  ? Antihistamines, Diphenhydramine-Type Other (See Comments)  ?  Enlarged prostate  ? ? ? ?REVIEW OF SYSTEMS:  ? ?'[X]'$  denotes positive finding, '[ ]'$  denotes negative finding ?Cardiac  Comments:  ?Chest pain or chest pressure:    ?Shortness of breath upon exertion:    ?Short of breath when lying flat:    ?Irregular heart rhythm:    ?    ?Vascular    ?Pain in calf, thigh, or hip brought on by ambulation:    ?Pain in feet at night that wakes you up from your sleep:     ?Blood clot in your veins:    ?Leg swelling:     ?    ?Pulmonary    ?Oxygen at home:    ?Productive cough:     ?Wheezing:     ?    ?Neurologic    ?Sudden weakness in arms or legs:     ?Sudden numbness in arms or legs:     ?Sudden onset of difficulty speaking or slurred speech:    ?Temporary loss of vision in one eye:     ?Problems with dizziness:     ?    ?Gastrointestinal    ?Blood in stool:     ?Vomited blood:     ?    ?Genitourinary    ?Burning when urinating:     ?Blood in urine:    ?    ?Psychiatric    ?Major depression:     ?    ?Hematologic    ?Bleeding problems:    ?Problems with blood  clotting too easily:    ?    ?Skin    ?Rashes or ulcers:    ?    ?Constitutional    ?Fever or chills:    ? ? ?PHYSICAL EXAMINATION: ? ?Vitals:  ? 12/28/21 1246  ?BP: (!) 144/76  ?Pulse: 78  ?Resp: 20  ?Temp:

## 2022-01-09 ENCOUNTER — Other Ambulatory Visit: Payer: Self-pay | Admitting: Internal Medicine

## 2022-01-19 ENCOUNTER — Other Ambulatory Visit: Payer: Medicare PPO

## 2022-01-25 ENCOUNTER — Ambulatory Visit: Payer: Medicare PPO | Admitting: Gastroenterology

## 2022-01-31 ENCOUNTER — Ambulatory Visit
Admission: RE | Admit: 2022-01-31 | Discharge: 2022-01-31 | Disposition: A | Discharge status: Home / Self Care | Payer: Medicare PPO | Source: Ambulatory Visit | Attending: Nurse Practitioner | Admitting: Nurse Practitioner

## 2022-01-31 DIAGNOSIS — I3139 Other pericardial effusion (noninflammatory): Secondary | ICD-10-CM | POA: Diagnosis not present

## 2022-01-31 DIAGNOSIS — R634 Abnormal weight loss: Secondary | ICD-10-CM

## 2022-01-31 DIAGNOSIS — R0600 Dyspnea, unspecified: Secondary | ICD-10-CM | POA: Diagnosis not present

## 2022-01-31 DIAGNOSIS — J90 Pleural effusion, not elsewhere classified: Secondary | ICD-10-CM | POA: Diagnosis not present

## 2022-01-31 DIAGNOSIS — J929 Pleural plaque without asbestos: Secondary | ICD-10-CM | POA: Diagnosis not present

## 2022-01-31 DIAGNOSIS — Z87891 Personal history of nicotine dependence: Secondary | ICD-10-CM

## 2022-01-31 MED ORDER — IOPAMIDOL (ISOVUE-300) INJECTION 61%
75.0000 mL | Freq: Once | INTRAVENOUS | Status: AC | PRN
  Administered 2022-01-31: 75 mL via INTRAVENOUS

## 2022-02-01 ENCOUNTER — Telehealth: Payer: Self-pay | Admitting: Nurse Practitioner

## 2022-02-01 DIAGNOSIS — J189 Pneumonia, unspecified organism: Secondary | ICD-10-CM

## 2022-02-01 MED ORDER — AMOXICILLIN-POT CLAVULANATE 875-125 MG PO TABS: 1.0000 | ORAL_TABLET | Freq: Two times a day (BID) | ORAL | 0 refills | Status: AC

## 2022-02-01 NOTE — Telephone Encounter (Signed)
Spoke with [name], pt's wife and DPR, regarding his CT chest scan results. There was a new infiltrate in the lingula, concerning for pna vs atelectasis, as well as trace b/l pleural effusions. He has a productive cough, maybe slightly increased from baseline. She stated that he has been sleeping a lot more over the last week and seems more fatigued. Denied any fevers and shortness of breath at baseline. She denied any swelling in his legs. Given his worsening cough and fatigue, recommended we empirically treat him with augmentin course Twice daily x 7 days. Instructed to take with food. Follow up already scheduled with Dr. Annamaria Boots on Monday, 6/26. Advised to seek emergency care if he develops shortness of breath or worsening symptoms in interim. Verbalized understanding.

## 2022-02-06 ENCOUNTER — Ambulatory Visit: Payer: Medicare PPO | Admitting: Internal Medicine

## 2022-02-06 ENCOUNTER — Encounter: Payer: Self-pay | Admitting: Internal Medicine

## 2022-02-06 VITALS — BP 120/64 | HR 71 | Temp 97.6°F | Ht 68.0 in | Wt 129.4 lb

## 2022-02-06 DIAGNOSIS — R918 Other nonspecific abnormal finding of lung field: Secondary | ICD-10-CM

## 2022-02-06 DIAGNOSIS — J300 Vasomotor rhinitis: Secondary | ICD-10-CM

## 2022-02-06 DIAGNOSIS — J4489 Other specified chronic obstructive pulmonary disease: Secondary | ICD-10-CM

## 2022-02-06 DIAGNOSIS — J449 Chronic obstructive pulmonary disease, unspecified: Secondary | ICD-10-CM

## 2022-02-06 DIAGNOSIS — G4734 Idiopathic sleep related nonobstructive alveolar hypoventilation: Secondary | ICD-10-CM | POA: Diagnosis not present

## 2022-02-06 NOTE — Assessment & Plan Note (Signed)
Pattern seems stable.  He has ipratropium nasal spray but often does not use it.

## 2022-03-03 DIAGNOSIS — H04123 Dry eye syndrome of bilateral lacrimal glands: Secondary | ICD-10-CM | POA: Diagnosis not present

## 2022-03-03 DIAGNOSIS — H10413 Chronic giant papillary conjunctivitis, bilateral: Secondary | ICD-10-CM | POA: Diagnosis not present

## 2022-03-13 NOTE — Progress Notes (Unsigned)
Cardiology Office Note:    Date:  03/16/2022   ID:  Jeffrey Meadows, DOB 11-12-1932, MRN [id number]  PCP:  [name], MD  Cardiologist:  Donato Heinz, MD  Electrophysiologist:  None   Referring MD: [name], MD   Chief complaint: dyspnea   History of Present Illness:    Jeffrey Meadows is a 88 y.o. male with a hx of hypertension, hyperlipidemia, Raynaud's disease, BPH, COPD, PAD who presents for follow-up.  He was referred by Dr. Renold Genta for evaluation of irregular heart rhythm, 06/22/20.  He reports that he has been doing well.  Denies any palpitations or chest pain.  Does report that he gets short of breath, particular with climbing up stairs.  States that he exercises with bicycle 15 to 20 minutes, in addition to doing push-ups and sit ups, most days of the week.  Denies any symptoms with this.  Does report he becomes lightheaded if standing too quickly.  Denies any syncope.  Smoked 1.5 ppd x 40 years, quit in mid 95s.  Father died of MI in late 60s/early 7s.    Since last clinic visit, he reports that he is doing okay.  He continues to have shortness of breath, no changes.  He denies any chest pain.  Denies any palpitations.  Reports some lightheadedness with standing, denies any syncope.  Reports some lower extremity edema.  Activity is limited by hip pain.   Past Medical History:  Diagnosis Date   Adenomatous polyp of colon 08/2001   removed hepatic flexure   Arthritis    Cataract    Chronic back pain    stenosis/scoliosis/synovial cyst   Enlarged prostate    takes Proscar daily   History of blood transfusion    no abnormal reaction noted   Hyperlipidemia    takes Atorvastatin daily   Hypertension    takes Amlodipine and HCTZ daily   Joint pain    On home oxygen therapy    at night   Raynaud disease    Urinary frequency    takes Flomax daily   Villous adenoma of colon 11/.2001   2 cm removed cecum    Past Surgical History:  Procedure Laterality  Date   ABDOMINAL AORTOGRAM N/A 08/15/2017   Procedure: ABDOMINAL AORTOGRAM;  Surgeon: Waynetta Sandy, MD;  Location: [facility];  Service: Cardiovascular;  Laterality: N/A;   cataract surgery     CYSTOSCOPY WITH LITHOLAPAXY N/A 01/11/2016   Procedure: CYSTOSCOPY WITH LITHOLAPAXY;  Surgeon: Festus Aloe, MD;  [facility];  Service: Urology;  Laterality: N/A;   GREEN LIGHT LASER TURP (TRANSURETHRAL RESECTION OF PROSTATE N/A 01/11/2016   Procedure: GREEN LIGHT LASER TURP (TRANSURETHRAL RESECTION OF PROSTATE;  Surgeon: Festus Aloe, MD;  [facility];  Service: Urology;  Laterality: N/A;   HERNIA REPAIR Left    inguinal   HOLMIUM LASER APPLICATION N/A 01/18/3709   Procedure: HOLMIUM LASER APPLICATION;  Surgeon: Festus Aloe, MD;  [facility];  Service: Urology;  Laterality: N/A;   JOINT REPLACEMENT     Right Hip   LAMINECTOMY Right 07/02/2014   Procedure: Right Lumbar two-three Laminectomy for Synovial Cyst;  Surgeon: Erline Levine, MD;  Location: [place] [facility];  Service: Neurosurgery;  Laterality: Right;   LOWER EXTREMITY ANGIOGRAPHY Bilateral 08/15/2017   Procedure: LOWER EXTREMITY ANGIOGRAPHY;  Surgeon: Waynetta Sandy, MD;  Location: [facility];  Service: Cardiovascular;  Laterality: Bilateral;   POPLITEAL VENOUS ANEURYSM REPAIR Left  2010   about 6 yrs ago   PROSTATE BIOPSY  4/95   SPINE SURGERY  Jan. 2016   Dr. Vickie Epley   TONSILLECTOMY     TOTAL HIP ARTHROPLASTY  1952   right    Current Medications: Current Meds  Medication Sig   acetaminophen (TYLENOL) 500 MG tablet Take 500 mg by mouth every 6 (six) hours as needed.   albuterol (PROAIR HFA) 108 (90 Base) MCG/ACT inhaler Inhale 1-2 puffs into the lungs every 4 (four) hours as needed. as needed shortness of breath prior to exercise   amLODipine (NORVASC) 10 MG tablet Take 1 tablet (10 mg total) by mouth daily.   atorvastatin (LIPITOR) 20 MG tablet TAKE 1 TABLET BY MOUTH EVERY  DAY   benazepril-hydrochlorthiazide (LOTENSIN HCT) 20-25 MG tablet    calcipotriene (DOVONOX) 0.005 % ointment calcipotriene 0.005 % topical ointment  APPLY TO AFFECTED AREAS TWICE DAILY   Cholecalciferol (VITAMIN D) 2000 UNITS CAPS Take 2,000 Units by mouth 2 (two) times a week.    finasteride (PROSCAR) 5 MG tablet Take 5 mg by mouth daily.   hydrochlorothiazide (HYDRODIURIL) 25 MG tablet TAKE 1 TABLET BY MOUTH EVERY DAY IN THE MORNING   hydrocortisone cream 1 % Apply twice daily for 4 weeks   ipratropium (ATROVENT) 0.03 % nasal spray 1-2 sprays each nostril before meals as needed   naproxen (NAPROSYN) 500 MG tablet Take 1 tablet (500 mg total) by mouth 2 (two) times daily with a meal.   Propylene Glycol (SYSTANE BALANCE OP) Apply to eye.   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: [employer]    Employer: RETIRED  Tobacco Use   Smoking status: Former    Packs/day: 1.50    Years: 40.00    Total pack years: 60.00    Types: Cigarettes    Quit date: 02/04/1989    Years since quitting: 33.1    Passive exposure: Never   Smokeless tobacco: Never   Tobacco comments:    quit smoking about 68yr ago  Substance and Sexual Activity   Alcohol use: Yes    Comment: 3-5 glasses of wine daily, 12-20 glasses a week   Drug use: No   Sexual activity: Not Currently  Other Topics Concern   Not on file  Social History Narrative   Lost license DUI   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   Exercise: Pt reports exercising 4 days a week for 60 minutes.   Seatbelts: Pt reports wearing seatbelt when in vehicles.    Sun Exposure/Protection: Pt reports not using sun protection.  Hobbies: reading, writing, music               Social Determinants of Health   Financial Resource Strain: Not on file  Food Insecurity: Not on file  Transportation Needs: Not on file  Physical Activity: Not on file  Stress: Not on file  Social Connections: Not on file     Family History: The patient's family history includes Aneurysm in his father; Heart disease in his mother; Heart failure in his mother.  ROS:   Please see the history of present illness.     All other systems reviewed and are negative.  EKGs/Labs/Other Studies Reviewed:    The following studies were reviewed today: Long term monitor 01/22 Study Highlights   1 episode of NSVT lasting 15 beats. 19 episodes of SVT, longest lasting 20 beats. Occasional PACs (2.4%).   14 days of data recorded on Zio monitor. Patient had a min HR of 55 bpm, max HR of 210 bpm, and avg HR of 78 bpm. Predominant underlying rhythm was Sinus Rhythm. No atrial fibrillation, high degree block, or pauses noted.  There was 1 episode of NSVT lasting 15 beats.  19 episodes of SVT, longest lasting 20 beats.  Isolated ventricular ectopy was rare (<1%).  Occasional PACs (2.4%).  There were 0 triggered events.  Echo 12/21 IMPRESSIONS     1. Normal GLS -16.7. Left ventricular ejection fraction, by estimation,  is 55 to 60%. The left ventricle has normal function. The left ventricle  has no regional wall motion abnormalities. Left ventricular diastolic  parameters were normal.   2. Right ventricular  systolic function is normal. The right ventricular  size is normal. There is normal pulmonary artery systolic pressure.   3. The mitral valve is degenerative. No evidence of mitral valve  regurgitation. No evidence of mitral stenosis. Moderate mitral annular  calcification.   4. Tricuspid valve regurgitation is mild to moderate.   5. Significant sclerosis without stenosis . The aortic valve is  tricuspid. Aortic valve regurgitation is not visualized. Mild to moderate  aortic valve sclerosis/calcification is present, without any evidence of  aortic stenosis.   6. Aortic dilatation noted. There is mild dilatation of the aortic root,  measuring 41 mm.   7. The inferior vena cava is normal in size with greater than 50%  respiratory variability, suggesting right atrial pressure of 3 mmHg.   Long term monitor 11/21 Study Highlights   There was a 2.1 second pause, possibly Mobitz II. No symptoms reported. Occasional PACs (1.1%)   4 days of data recorded on Zio monitor. Patient had a min HR of 29 bpm, max HR of 143 bpm, and avg HR of 80 bpm. Predominant underlying rhythm was Sinus Rhythm. No VT or atrial fibrillation.  There was a 2.9 second pause, possibly Mobitz II.  2 episodes of SVT, longest lasting 9 beats.  Isolated ventricular ectopy was rare (<1%). Isolated atrial ectopy was occasional (1.1%).  There were 0 triggered events.   EKG:   03/16/22: Sinus rhythm, rate 95, PVCs 07/22: sinus rhythm, rate 73, poor R wave regression, no ST abnormalities 11/21: normal sinus rhythm, rate 77, no ST/T abnormalities   Recent Labs: 06/06/2021: ALT 10; BUN 17; Potassium 3.7; Sodium 137 06/27/2021: Hemoglobin 15.4; Platelets 330 07/27/2021: Creatinine, Ser 0.70  Recent Lipid Panel    Component Value Date/Time   CHOL 154 06/06/2021 0936   TRIG 86 06/06/2021 0936   HDL 79 06/06/2021 0936   CHOLHDL 1.9 06/06/2021 0936  VLDL 13 03/13/2017 1034   LDLCALC 58 06/06/2021 0936    Physical Exam:     VS:  BP 126/62   Pulse 95   Ht '5\' 7"'$  (1.702 m)   Wt 128 lb (58.1 kg)   SpO2 92%   BMI 20.05 kg/m     Wt Readings from Last 3 Encounters:  03/16/22 128 lb (58.1 kg)  02/06/22 129 lb 6.4 oz (58.7 kg)  12/28/21 131 lb (59.4 kg)     GEN:  Well nourished, well developed in no acute distress HEENT: Normal NECK: No JVD; No carotid bruits LYMPHATICS: No lymphadenopathy CARDIAC: RRR, no murmurs, rubs, gallops RESPIRATORY:  Clear to auscultation without rales, wheezing or rhonchi  ABDOMEN: Soft, non-tender, non-distended MUSCULOSKELETAL:  trace edema at ankle bilaterally; No deformity  SKIN: Warm and dry NEUROLOGIC:  Alert and oriented x 3 PSYCHIATRIC:  Normal affect   ASSESSMENT:    1. Irregular heart rhythm   2. SOB (shortness of breath)   3. Essential hypertension   4. Hyperlipidemia, unspecified hyperlipidemia type   5. Aortic dilatation (HCC)      PLAN:    Irregular heart rhythm:  Zio patch x4 days on 06/24/2020 showed a 2.1-second pause, possibly Mobitz 2, no symptoms reported.  Also had occasional PACs (1% of beats).  Repeat Zio patch on 08/18/20 x 14 days showed 1 episode of NSVT lasting 15 beats, 19 episodes of SVT with longest lasting 20 beats, occasional PACs (2.4%), no pauses.  Echocardiogram on 07/16/2020 showed normal biventricular function, no significant valvular disease, mild dilatation of aortic root measuring 41 mm.  Dyspnea: Suspect due to COPD, no structural heart disease on echocardiogram as above  Hypertension: On amlodipine 10 mg daily and hydrochlorothiazide 25 mg daily.  Appears controlled.    Hyperlipidemia: On atorvastatin 20 mg daily.  LDL 58 05/2021  PAD: Status post left SFA to popliteal artery bypass graft in 2006 for aneurysm.  Follows with Dr. Donzetta Matters  Aortic dilatation: Echocardiogram 07/16/2020 showed mild dilatation of aortic root measuring 41 mm.  CTA chest 07/27/2021 showed dilatation of sinus of Valsalva measuring 44 mm.  CTA chest was also  notable for small pulmonary nodules in pancreatic cystic lesion, for which follow-up CT chest/abdomen/pelvis recommended in 1 year  RTC in 6 months   Medication Adjustments/Labs and Tests Ordered: Current medicines are reviewed at length with the patient today.  Concerns regarding medicines are outlined above.  Orders Placed This Encounter  Procedures   EKG 12-Lead    No orders of the defined types were placed in this encounter.    Patient Instructions  Medication Instructions:  Your physician recommends that you continue on your current medications as directed. Please refer to the Current Medication list given to you today.  *If you need a refill on your cardiac medications before your next appointment, please call your pharmacy*   Lab Work: None If you have labs (blood work) drawn today and your tests are completely normal, you will receive your results only by: [place] (if you have MyChart) OR A paper copy in the mail If you have any lab test that is abnormal or we need to change your treatment, we will call you to review the results.   Testing/Procedures: None   Follow-Up: At [facility], you and your health needs are our priority.  As part of our continuing mission to provide you with exceptional heart care, we have created designated Provider Care Teams.  These Care Teams include your primary  Cardiologist (physician) and Advanced Practice Providers (APPs -  Physician Assistants and Nurse Practitioners) who all work together to provide you with the care you need, when you need it.  We recommend signing up for the patient portal called "MyChart".  Sign up information is provided on this After Visit Summary.  MyChart is used to connect with patients for Virtual Visits (Telemedicine).  Patients are able to view lab/test results, encounter notes, upcoming appointments, etc.  Non-urgent messages can be sent to your provider as well.   To learn more about what you can  do with MyChart, go to [email or web address].    Your next appointment:   6 month(s)  The format for your next appointment:   In Person  Provider:   Donato Heinz, MD     Other Instructions   Important Information About Sugar           Signed, Donato Heinz, MD  03/16/2022 10:59 PM    [place]

## 2022-03-16 ENCOUNTER — Ambulatory Visit: Payer: Medicare PPO | Admitting: Cardiology

## 2022-03-16 ENCOUNTER — Encounter: Payer: Self-pay | Admitting: Cardiology

## 2022-03-16 VITALS — BP 126/62 | HR 95 | Ht 67.0 in | Wt 128.0 lb

## 2022-03-16 DIAGNOSIS — E785 Hyperlipidemia, unspecified: Secondary | ICD-10-CM

## 2022-03-16 DIAGNOSIS — I77819 Aortic ectasia, unspecified site: Secondary | ICD-10-CM

## 2022-03-16 DIAGNOSIS — I499 Cardiac arrhythmia, unspecified: Secondary | ICD-10-CM

## 2022-03-16 DIAGNOSIS — R0602 Shortness of breath: Secondary | ICD-10-CM | POA: Diagnosis not present

## 2022-03-16 DIAGNOSIS — I10 Essential (primary) hypertension: Secondary | ICD-10-CM

## 2022-03-16 NOTE — Patient Instructions (Addendum)
Medication Instructions:  Your physician recommends that you continue on your current medications as directed. Please refer to the Current Medication list given to you today.  *If you need a refill on your cardiac medications before your next appointment, please call your pharmacy*   Lab Work: None If you have labs (blood work) drawn today and your tests are completely normal, you will receive your results only by: [place] (if you have MyChart) OR A paper copy in the mail If you have any lab test that is abnormal or we need to change your treatment, we will call you to review the results.   Testing/Procedures: None   Follow-Up: At [facility], you and your health needs are our priority.  As part of our continuing mission to provide you with exceptional heart care, we have created designated Provider Care Teams.  These Care Teams include your primary Cardiologist (physician) and Advanced Practice Providers (APPs -  Physician Assistants and Nurse Practitioners) who all work together to provide you with the care you need, when you need it.  We recommend signing up for the patient portal called "MyChart".  Sign up information is provided on this After Visit Summary.  MyChart is used to connect with patients for Virtual Visits (Telemedicine).  Patients are able to view lab/test results, encounter notes, upcoming appointments, etc.  Non-urgent messages can be sent to your provider as well.   To learn more about what you can do with MyChart, go to [email or web address].    Your next appointment:   6 month(s)  The format for your next appointment:   In Person  Provider:   Donato Heinz, MD     Other Instructions   Important Information About Sugar

## 2022-03-21 ENCOUNTER — Ambulatory Visit: Payer: Medicare PPO | Admitting: Podiatry

## 2022-03-21 DIAGNOSIS — R234 Changes in skin texture: Secondary | ICD-10-CM

## 2022-03-21 DIAGNOSIS — B351 Tinea unguium: Secondary | ICD-10-CM | POA: Diagnosis not present

## 2022-03-21 DIAGNOSIS — M79675 Pain in left toe(s): Secondary | ICD-10-CM

## 2022-03-21 DIAGNOSIS — M79674 Pain in right toe(s): Secondary | ICD-10-CM | POA: Diagnosis not present

## 2022-03-21 DIAGNOSIS — I739 Peripheral vascular disease, unspecified: Secondary | ICD-10-CM | POA: Diagnosis not present

## 2022-03-21 NOTE — Progress Notes (Signed)
  Subjective:  Patient ID: Jeffrey Meadows, male    DOB: 1933/02/01,  MRN: [id number]  Jeffrey Meadows presents to clinic today for thick, elongated toenails b/l lower extremities which are tender when wearing enclosed shoe gear.  He is accompanied by his wife, Jeffrey Meadows, on today's visit.  Wife relates concern about peeling skin on his left heel. She has been applying moisturizer daily.   PCP is [name], MD , and last visit was  June 27, 2021  Allergies  Allergen Reactions   Antihistamines, Diphenhydramine-Type Other (See Comments)    Enlarged prostate    Review of Systems: Negative except as noted in the HPI.  Objective: No changes noted in today's physical examination. Constitutional Jeffrey Meadows is a pleasant 88 y.o. Caucasian male, WD, WN in NAD. AAO x 3.   Vascular CFT immediate b/l LE. Palpable DP/PT pulses b/l LE. Digital hair present b/l. Skin temperature gradient WNL b/l. No pain with calf compression b/l. No edema noted b/l. No cyanosis or clubbing noted b/l LE.  Neurologic Normal speech. Oriented to person, place, and time. Protective sensation intact 5/5 intact bilaterally with 10g monofilament b/l.  Dermatologic Pedal skin warm and supple b/l.  No open wounds b/l. No interdigital macerations. Toenails 1-5 b/l elongated, thickened, discolored with subungual debris. +Tenderness with dorsal palpation of nailplates. No hyperkeratotic nor porokeratotic lesions noted b/l. He does have a mild of mount of peeling skin noted on posterolateral aspect of left heel. No warmth, no erythema, no underlying fluctuance. There is blanchable erythema.  Orthopedic: Muscle strength 5/5 to all lower extremity muscle groups bilaterally. HAV with bunion deformity noted b/l LE. Plantar fat pad atrophy of forefoot area b/l lower extremities.   Radiographs: None  Assessment/Plan: 1. Pain due to onychomycosis of toenails of both feet   2. Peeling skin   3. Claudication in peripheral  vascular disease [facility])     -Patient's family member present. All questions/concerns addressed on today's visit. -Examined patient. -Gently removed peeling skin of left heel. Moisturizer applied. Instructed his wife to mix Eucerin and Freight forwarder. -Mycotic toenails 1-5 bilaterally were debrided in length and girth with sterile nail nippers and dremel without incident. -Patient/POA to call should there be question/concern in the interim.   Return in about 3 months (around 06/21/2022).  Marzetta Board, DPM

## 2022-03-27 ENCOUNTER — Encounter: Payer: Self-pay | Admitting: Podiatry

## 2022-04-03 ENCOUNTER — Ambulatory Visit
Admission: RE | Admit: 2022-04-03 | Discharge: 2022-04-03 | Disposition: A | Discharge status: Home / Self Care | Payer: Medicare PPO | Source: Ambulatory Visit | Attending: Internal Medicine | Admitting: Internal Medicine

## 2022-04-03 DIAGNOSIS — I700 Atherosclerosis of aorta: Secondary | ICD-10-CM | POA: Diagnosis not present

## 2022-04-03 DIAGNOSIS — R918 Other nonspecific abnormal finding of lung field: Secondary | ICD-10-CM | POA: Diagnosis not present

## 2022-04-03 DIAGNOSIS — R911 Solitary pulmonary nodule: Secondary | ICD-10-CM | POA: Diagnosis not present

## 2022-04-03 DIAGNOSIS — J439 Emphysema, unspecified: Secondary | ICD-10-CM | POA: Diagnosis not present

## 2022-04-05 ENCOUNTER — Other Ambulatory Visit: Payer: Self-pay

## 2022-04-05 DIAGNOSIS — R918 Other nonspecific abnormal finding of lung field: Secondary | ICD-10-CM

## 2022-04-05 NOTE — Progress Notes (Signed)
Order placed

## 2022-04-07 ENCOUNTER — Telehealth: Payer: Self-pay | Admitting: Internal Medicine

## 2022-04-07 DIAGNOSIS — Z0289 Encounter for other administrative examinations: Secondary | ICD-10-CM

## 2022-04-07 NOTE — Telephone Encounter (Signed)
Received form to be completed on 04/06/2022 from [facility] [phone], phone [place]

## 2022-04-07 NOTE — Telephone Encounter (Signed)
Completed and fax back form to [facility] [phone], phone [phone]  Confidential Medical Record Profile Release of Information Confidential [place] History filled out by patient Current Medication List Immunization List H&P Last CPE

## 2022-04-25 ENCOUNTER — Ambulatory Visit: Payer: Medicare PPO | Admitting: Internal Medicine

## 2022-04-28 ENCOUNTER — Telehealth: Payer: Self-pay | Admitting: Internal Medicine

## 2022-04-28 DIAGNOSIS — J449 Chronic obstructive pulmonary disease, unspecified: Secondary | ICD-10-CM

## 2022-04-28 DIAGNOSIS — J4489 Other specified chronic obstructive pulmonary disease: Secondary | ICD-10-CM

## 2022-04-28 NOTE — Telephone Encounter (Signed)
Patient's wife states that patient does not use oxygen compressor and hasn't for a while. States it is a blue machine that hums and costs $14 a month. Patient would like it picked up. States DME is [facility].  Please advise.

## 2022-04-30 ENCOUNTER — Other Ambulatory Visit: Payer: Self-pay | Admitting: Internal Medicine

## 2022-05-01 NOTE — Telephone Encounter (Signed)
Patient's wife states that patient does not use oxygen compressor and hasn't for a while. States it is a blue machine that hums and costs $14 a month. Patient would like it picked up. States DME is [facility].

## 2022-05-01 NOTE — Telephone Encounter (Signed)
Order- DME [facility]- please DC home oxygen

## 2022-05-01 NOTE — Telephone Encounter (Signed)
Order placed. Nothing further needed. 

## 2022-05-17 DIAGNOSIS — W19XXXA Unspecified fall, initial encounter: Secondary | ICD-10-CM | POA: Diagnosis not present

## 2022-05-17 DIAGNOSIS — S0990XA Unspecified injury of head, initial encounter: Secondary | ICD-10-CM | POA: Diagnosis not present

## 2022-05-17 DIAGNOSIS — Z743 Need for continuous supervision: Secondary | ICD-10-CM | POA: Diagnosis not present

## 2022-05-18 ENCOUNTER — Encounter (HOSPITAL_BASED_OUTPATIENT_CLINIC_OR_DEPARTMENT_OTHER): Payer: Self-pay

## 2022-05-18 ENCOUNTER — Emergency Department (HOSPITAL_BASED_OUTPATIENT_CLINIC_OR_DEPARTMENT_OTHER)
Admission: EM | Admit: 2022-05-18 | Discharge: 2022-05-18 | Disposition: A | Discharge status: Home / Self Care | Payer: Medicare PPO | Attending: Emergency Medicine | Admitting: Emergency Medicine

## 2022-05-18 ENCOUNTER — Emergency Department (HOSPITAL_BASED_OUTPATIENT_CLINIC_OR_DEPARTMENT_OTHER): Payer: Medicare PPO

## 2022-05-18 ENCOUNTER — Other Ambulatory Visit: Payer: Self-pay

## 2022-05-18 DIAGNOSIS — I10 Essential (primary) hypertension: Secondary | ICD-10-CM | POA: Insufficient documentation

## 2022-05-18 DIAGNOSIS — M47812 Spondylosis without myelopathy or radiculopathy, cervical region: Secondary | ICD-10-CM | POA: Diagnosis not present

## 2022-05-18 DIAGNOSIS — S0101XA Laceration without foreign body of scalp, initial encounter: Secondary | ICD-10-CM | POA: Diagnosis not present

## 2022-05-18 DIAGNOSIS — W01198A Fall on same level from slipping, tripping and stumbling with subsequent striking against other object, initial encounter: Secondary | ICD-10-CM | POA: Insufficient documentation

## 2022-05-18 DIAGNOSIS — Z79899 Other long term (current) drug therapy: Secondary | ICD-10-CM | POA: Diagnosis not present

## 2022-05-18 DIAGNOSIS — F1010 Alcohol abuse, uncomplicated: Secondary | ICD-10-CM

## 2022-05-18 DIAGNOSIS — J449 Chronic obstructive pulmonary disease, unspecified: Secondary | ICD-10-CM | POA: Diagnosis not present

## 2022-05-18 DIAGNOSIS — M4802 Spinal stenosis, cervical region: Secondary | ICD-10-CM | POA: Diagnosis not present

## 2022-05-18 DIAGNOSIS — S0990XA Unspecified injury of head, initial encounter: Secondary | ICD-10-CM | POA: Diagnosis present

## 2022-05-18 DIAGNOSIS — I6789 Other cerebrovascular disease: Secondary | ICD-10-CM | POA: Diagnosis not present

## 2022-05-18 DIAGNOSIS — R262 Difficulty in walking, not elsewhere classified: Secondary | ICD-10-CM | POA: Diagnosis not present

## 2022-05-18 DIAGNOSIS — W19XXXA Unspecified fall, initial encounter: Secondary | ICD-10-CM

## 2022-05-18 DIAGNOSIS — Y92009 Unspecified place in unspecified non-institutional (private) residence as the place of occurrence of the external cause: Secondary | ICD-10-CM

## 2022-05-18 NOTE — ED Provider Notes (Signed)
[facility] EMERGENCY DEPT Provider Note  CSN: [id number] Arrival date & time: 05/18/22 0008  Chief Complaint(s) Fall and Laceration  HPI Jeffrey Meadows is a 88 y.o. male     Fall This is a recurrent problem. The current episode started 3 to 5 hours ago. Episode frequency: recurring. Pertinent negatives include no chest pain, no abdominal pain, no headaches and no shortness of breath. The symptoms are aggravated by walking.  Laceration Location:  Head/neck Head/neck laceration location:  Scalp Depth:  Through underlying tissue Quality: straight   Laceration mechanism:  Blunt object and fall Pain details:    Severity:  No pain   Past Medical History Past Medical History:  Diagnosis Date   Adenomatous polyp of colon 08/2001   removed hepatic flexure   Arthritis    Cataract    Chronic back pain    stenosis/scoliosis/synovial cyst   Enlarged prostate    takes Proscar daily   History of blood transfusion    no abnormal reaction noted   Hyperlipidemia    takes Atorvastatin daily   Hypertension    takes Amlodipine and HCTZ daily   Joint pain    On home oxygen therapy    at night   Raynaud disease    Urinary frequency    takes Flomax daily   Villous adenoma of colon 11/.2001   2 cm removed cecum   Patient Active Problem List   Diagnosis Date Noted   Nocturnal hypoxemia 12/21/2021   Unintentional weight loss 12/21/2021   Bilateral impacted cerumen 09/16/2021   Sprain of right wrist 07/22/2021   Vasomotor rhinitis 02/04/2021   Chronic pain of right wrist 04/06/2020   Knee instability 10/28/2019   Chronic respiratory failure with hypoxia ([place]) 10/02/2017   Acute midline low back pain without sciatica 06/29/2016   Chronic alcoholism ([place]) 06/29/2016   Osteoarthritis of cervical spine 03/01/2016   Strain of left trapezius muscle 10/21/2015   Peripheral vascular disease, unspecified ([place]) 12/02/2013   Essential tremor 10/14/2013   Hoarseness of voice  04/12/2013   Nasal septal deviation 07/16/2012   Chronic foot pain, left 05/09/2012   Claudication in peripheral vascular disease ([place]) 11/21/2011   Constipation 03/09/2011   Hearing impairment 03/09/2011   Allergic rhinitis 02/02/2011   Hip pain, chronic 06/08/2010   Tobacco use disorder, severe, in sustained remission 03/18/2010   BPH with obstruction/lower urinary tract symptoms 06/17/2008   ANOMALY, CONGENITAL HYPOSPADIAS 04/23/2007   Hyperlipidemia 10/11/2006   HYPERTENSION, BENIGN SYSTEMIC 10/11/2006   HEMORRHOIDS, NOS 10/11/2006   COPD with chronic bronchitis and emphysema ([place]) 10/11/2006   PSORIASIS 10/11/2006   GAIT, ABNORMAL 10/11/2006   Home Medication(s) Prior to Admission medications   Medication Sig Start Date End Date Taking? Authorizing Provider  acetaminophen (TYLENOL) 500 MG tablet Take 500 mg by mouth every 6 (six) hours as needed.    [provider]  albuterol (PROAIR HFA) 108 (90 Base) MCG/ACT inhaler Inhale 1-2 puffs into the lungs every 4 (four) hours as needed. as needed shortness of breath prior to exercise 05/09/21   Baird Lyons D, MD  amLODipine (NORVASC) 10 MG tablet Take 1 tablet (10 mg total) by mouth daily. 09/23/21   [name], MD  atorvastatin (LIPITOR) 20 MG tablet TAKE 1 TABLET BY MOUTH EVERY DAY 01/09/22   [name], MD  benazepril-hydrochlorthiazide (LOTENSIN HCT) 20-25 MG tablet     [provider]  calcipotriene (DOVONOX) 0.005 % ointment calcipotriene 0.005 % topical ointment  APPLY TO  AFFECTED AREAS TWICE DAILY    [provider]  Cholecalciferol (VITAMIN D) 2000 UNITS CAPS Take 2,000 Units by mouth 2 (two) times a week.     [provider]  finasteride (PROSCAR) 5 MG tablet Take 5 mg by mouth daily. 07/18/18   [provider]  hydrochlorothiazide (HYDRODIURIL) 25 MG tablet TAKE 1 TABLET BY MOUTH EVERY DAY IN THE MORNING 04/30/22   [name], MD  hydrocortisone cream 1 % Apply twice  daily for 4 weeks 07/27/20   [name], MD  ipratropium (ATROVENT) 0.03 % nasal spray 1-2 sprays each nostril before meals as needed 05/09/21   Baird Lyons D, MD  naproxen (NAPROSYN) 500 MG tablet Take 1 tablet (500 mg total) by mouth 2 (two) times daily with a meal. 08/09/21   Baxley, Cresenciano Lick, MD  Propylene Glycol (SYSTANE BALANCE OP) Apply to eye.    [provider]  tamsulosin (FLOMAX) 0.4 MG CAPS capsule 1 capsule 30 minutes after the same meal each day    [provider]  triamcinolone cream (KENALOG) 0.1 % triamcinolone acetonide 0.1 % topical cream  TAKE 1 APPLICATION APPLY ON THE SKIN AS DIRECTED TWICE A DAY X 2 WEEKS TO DERMATITIS ON BACK    [provider]                                                                                                                                    Allergies Antihistamines, diphenhydramine-type  Review of Systems Review of Systems  Respiratory:  Negative for shortness of breath.   Cardiovascular:  Negative for chest pain.  Gastrointestinal:  Negative for abdominal pain.  Neurological:  Negative for headaches.   As noted in HPI  Physical Exam Vital Signs  I have reviewed the triage vital signs BP 114/67   Pulse 74   Temp 98 F (36.7 C) (Oral)   Resp 18   Ht '5\' 9"'$  (1.753 m)   Wt 63.5 kg   SpO2 95%   BMI 20.67 kg/m   Physical Exam Constitutional:      General: He is not in acute distress.    Appearance: He is well-developed. He is not diaphoretic.  HENT:     Head: Normocephalic. Laceration present.      Right Ear: External ear normal.     Left Ear: External ear normal.  Eyes:     General: No scleral icterus.       Right eye: No discharge.        Left eye: No discharge.     Conjunctiva/sclera: Conjunctivae normal.     Pupils: Pupils are equal, round, and reactive to light.  Cardiovascular:     Rate and Rhythm: Regular rhythm.     Pulses:          Radial pulses are 2+ on the right side and  2+ on the left side.  Dorsalis pedis pulses are 2+ on the right side and 2+ on the left side.     Heart sounds: Normal heart sounds. No murmur heard.    No friction rub. No gallop.  Pulmonary:     Effort: Pulmonary effort is normal. No respiratory distress.     Breath sounds: Normal breath sounds. No stridor.  Abdominal:     General: There is no distension.     Palpations: Abdomen is soft.     Tenderness: There is no abdominal tenderness.  Musculoskeletal:     Cervical back: Normal range of motion and neck supple. No bony tenderness.     Thoracic back: No bony tenderness.     Lumbar back: No bony tenderness.     Comments: Clavicle stable. Chest stable to AP/Lat compression. Pelvis stable to Lat compression. No obvious extremity deformity. No chest or abdominal wall contusion.  Skin:    General: Skin is warm.  Neurological:     Mental Status: He is alert and oriented to person, place, and time.     GCS: GCS eye subscore is 4. GCS verbal subscore is 5. GCS motor subscore is 6.     Comments: Moving all extremities      ED Results and Treatments Labs (all labs ordered are listed, but only abnormal results are displayed) Labs Reviewed - No data to display                                                                                                                       EKG  EKG Interpretation  Date/Time:    Ventricular Rate:    PR Interval:    QRS Duration:   QT Interval:    QTC Calculation:   R Axis:     Text Interpretation:         Radiology CT Cervical Spine Wo Contrast  Result Date: 05/18/2022 CLINICAL DATA:  Status post fall. EXAM: CT CERVICAL SPINE WITHOUT CONTRAST TECHNIQUE: Multidetector CT imaging of the cervical spine was performed without intravenous contrast. Multiplanar CT image reconstructions were also generated. RADIATION DOSE REDUCTION: This exam was performed according to the departmental dose-optimization program which includes automated  exposure control, adjustment of the mA and/or kV according to patient size and/or use of iterative reconstruction technique. COMPARISON:  None Available. FINDINGS: Alignment: Normal. Skull base and vertebrae: No acute fracture. No primary bone lesion or focal pathologic process. Soft tissues and spinal canal: No prevertebral fluid or swelling. No visible canal hematoma. Disc levels: Marked severity endplate sclerosis, moderate severity anterior osteophyte formation and mild to moderate severity posterior bony spurring are seen at the levels of C3-C4, C4-C5, C5-C6 and C6-C7. There is moderate severity narrowing of the anterior atlantoaxial articulation. Marked severity, predominately posterior intervertebral disc space narrowing is seen at C2-C3 at C3-C4, with diffuse marked severity intervertebral disc space narrowing seen at C4-C5, C5-C6 and C6-C7. Bilateral marked severity multilevel facet joint hypertrophy is noted. Upper chest: There is marked emphysematous lung disease. A  7 mm posterior right apical noncalcified lung nodule versus focal scar seen. A similar appearing 5 mm posteromedial left upper lobe lung nodule versus focal scar is noted. Moderate severity anterior left upper lobe scarring is seen. Other: None. IMPRESSION: 1. No acute fracture or subluxation in the cervical spine. 2. Marked severity multilevel degenerative changes, as described above. 3. Marked severity emphysematous lung disease with bilateral upper lobe noncalcified lung nodules versus focal scars. Correlation with nonemergent follow-up chest CT is recommended to confirm stability. Emphysema (ICD10-J43.9). Electronically Signed   By: Virgina Norfolk M.D.   On: 05/18/2022 01:13   CT Head Wo Contrast  Result Date: 05/18/2022 CLINICAL DATA:  Status post fall. EXAM: CT HEAD WITHOUT CONTRAST TECHNIQUE: Contiguous axial images were obtained from the base of the skull through the vertex without intravenous contrast. RADIATION DOSE REDUCTION:  This exam was performed according to the departmental dose-optimization program which includes automated exposure control, adjustment of the mA and/or kV according to patient size and/or use of iterative reconstruction technique. COMPARISON:  None Available. FINDINGS: Brain: There is moderate severity cerebral atrophy with widening of the extra-axial spaces and ventricular dilatation. There are areas of decreased attenuation within the white matter tracts of the supratentorial brain, consistent with microvascular disease changes. Vascular: Marked severity calcification of the bilateral cavernous carotid arteries is seen. Skull: Normal. Negative for fracture or focal lesion. Sinuses/Orbits: No acute finding. Other: Mild left parietooccipital scalp soft tissue swelling is seen IMPRESSION: 1. Mild left parietooccipital scalp soft tissue swelling without evidence for an acute fracture or acute intracranial abnormality. 2. Generalized cerebral atrophy and microvascular disease changes of the supratentorial brain. Electronically Signed   By: Virgina Norfolk M.D.   On: 05/18/2022 01:09    Medications Ordered in ED Medications - No data to display                                                                                                                                   Procedures .Marland KitchenLaceration Repair  Date/Time: 05/18/2022 3:48 AM  Performed by: Fatima Blank, MD Authorized by: Fatima Blank, MD   Consent:    Consent obtained:  Verbal   Consent given by:  Patient   Risks discussed:  Infection, pain, poor cosmetic result and poor wound healing   Alternatives discussed:  Delayed treatment Universal protocol:    Imaging studies available: yes     Patient identity confirmed:  Verbally with patient and arm band Anesthesia:    Anesthesia method:  None Laceration details:    Location:  Scalp   Scalp location:  Occipital   Length (cm):  3   Depth (mm):  5 Pre-procedure details:     Preparation:  Patient was prepped and draped in usual sterile fashion and imaging obtained to evaluate for foreign bodies Exploration:    Imaging outcome: foreign body not noted     Wound extent: no foreign bodies/material noted,  no muscle damage noted and no vascular damage noted   Treatment:    Area cleansed with:  Saline   Amount of cleaning:  Standard   Irrigation solution:  Sterile saline   Irrigation method:  Pressure wash Skin repair:    Repair method:  Sutures   Suture size:  4-0   Wound skin closure material used: vicryl rapide.   Suture technique:  Running locked   Number of sutures:  4 Approximation:    Approximation:  Close Repair type:    Repair type:  Simple Post-procedure details:    Dressing:  Open (no dressing)   Procedure completion:  Tolerated   (including critical care time)  Medical Decision Making / ED Course   Medical Decision Making Amount and/or Complexity of Data Reviewed Radiology: ordered and independent interpretation performed. Decision-making details documented in ED Course.    Patient sustained scalp laceration after a fall. History of gait instability and daily alcohol use. No anticoagulation Denies any other injuries from the fall.  CT head and cervical spine obtained to rule out ICH, fracture or dislocation.  CT head and cervical spine without acute injuries. Laceration was thoroughly irrigated and closed as above.       Final Clinical Impression(s) / ED Diagnoses Final diagnoses:  Fall in home, initial encounter  Laceration of scalp, initial encounter  Difficulty walking  Alcohol abuse, daily use   The patient appears reasonably screened and/or stabilized for discharge and I doubt any other medical condition or other [facility] requiring further screening, evaluation, or treatment in the ED at this time. I have discussed the findings, Dx and Tx plan with the patient/family who expressed understanding and agree(s) with the plan.  Discharge instructions discussed at length. The patient/family was given strict return precautions who verbalized understanding of the instructions. No further questions at time of discharge.  Disposition: Discharge  Condition: Good  ED Discharge Orders     None        Follow Up: [name], MD [id number] [place] [id number] [phone]  Call  to schedule an appointment for close follow up            This chart was dictated using voice recognition software.  Despite best efforts to proofread,  errors can occur which can change the documentation meaning.    Fatima Blank, MD 05/18/22 [phone]

## 2022-05-18 NOTE — ED Triage Notes (Addendum)
Brought in by [facility] Pt states he lost his balance, fell backwards striking the back of head +laceration Bleeding controlled Admits to having 2 glasses of wine States he is not on any blood thinners Denies LOC  Spoke with wife in lobby and pt has falling 3x tonight  Once at restaurant and 2x at home He has drank wine and brandy per wife

## 2022-05-18 NOTE — ED Notes (Signed)
Cleansed wound to back of head bleeding controlled

## 2022-05-18 NOTE — Discharge Instructions (Signed)
Do not let your laceration (cut) get wet for the next 48 hours. After that you may allow soapy water to drain down the wound to clean it. Please do not scrub.  To minimize scarring, you can apply a vaseline based ointment for the next 2 weeks and keep it out of direct sun light. After that, you may apply sunscreen for the next several months. Your stitches will dissolve on their own.  Return if your wound appears to be infected (see laceration care instructions).

## 2022-05-25 ENCOUNTER — Telehealth: Payer: Self-pay | Admitting: Internal Medicine

## 2022-05-25 NOTE — Telephone Encounter (Signed)
[name] called wanting you to call in a prescription that is normal filled by [facility]. She said she didn't have time to call them to get it filled. I let her know that she would need to call them since it was something you had never filled before. I let her know she could call pharmacy and they could send request.

## 2022-05-26 ENCOUNTER — Ambulatory Visit: Payer: Medicare PPO | Admitting: Internal Medicine

## 2022-05-26 ENCOUNTER — Encounter: Payer: Self-pay | Admitting: Internal Medicine

## 2022-05-26 VITALS — BP 120/64 | HR 84 | Temp 97.6°F | Ht 69.0 in

## 2022-05-26 DIAGNOSIS — Z4802 Encounter for removal of sutures: Secondary | ICD-10-CM

## 2022-05-26 DIAGNOSIS — Z23 Encounter for immunization: Secondary | ICD-10-CM

## 2022-05-26 DIAGNOSIS — S0101XD Laceration without foreign body of scalp, subsequent encounter: Secondary | ICD-10-CM

## 2022-05-26 DIAGNOSIS — W19XXXD Unspecified fall, subsequent encounter: Secondary | ICD-10-CM | POA: Diagnosis not present

## 2022-05-26 DIAGNOSIS — Y92009 Unspecified place in unspecified non-institutional (private) residence as the place of occurrence of the external cause: Secondary | ICD-10-CM

## 2022-05-26 DIAGNOSIS — S0990XD Unspecified injury of head, subsequent encounter: Secondary | ICD-10-CM

## 2022-05-26 NOTE — Progress Notes (Signed)
   Subjective:    Patient ID: Jeffrey Meadows, male    DOB: 1932/10/23, 88 y.o.   MRN: [id number]  HPI Here for follow up after a fall at his home on October 4th. He did not lose consciousness.  Wife called me concerning the fall in the late evening on October 4.  EMS personnel were on site.  Patient suffered a scalp laceration.  He was alert and able to move upper and lower extremities.  Subsequently [facility] by EMS [facility].  Had 4 sutures  to scalp laceration with 4-0 Vicryl by emergency department personnel.  Had CT of the head without contrast which showed no evidence of skull fracture.  Had mild left parieto-occipital scalp soft tissue swelling.  Generalized cerebral atrophy and microvascular disease.  CT of C-spine was negative for vertebral fractures.  Review of Systems history of COPD and smoking.  Receives serial CT scans to monitor nodules and emphysematous change.  He has no complaint of headache.  No memory loss.  He is accompanied by his wife today.     Objective:   Physical Exam Vital signs: Blood pressure 120/64 pulse 84 temperature 97.6 degrees pulse oximetry 96% on room air height 5 feet 9 inches BMI 20.67  He is alert and oriented x3.  He knows me.  He is able to give a concise history.  He is articulate.  No gross focal deficits on brief neurological exam.  Laceration of his scalp is well-healed.  Sutures that are visible  in right exterior parietal scalp area were removed x4.       Assessment & Plan:  Recent fall at home resulting in scalp laceration repaired at emergency department with 4 sutures which were removed today.  Head trauma secondary to fall-today he is alert and oriented x3 and able to give a clear concise history.  History of COPD  Remote history of smoking  Plan: He and his wife have a trainer that comes to their home a couple of times a week mainly for his wife but he has been helping the patient with ambulation.  I think this is  helpful for patient at the present time.  I do not think home physical therapy would add much more than what the trainer is already doing.  They are in the process of moving to [facility] in the near future once they have their residence there made ready for them.  Her living just on the first floor of their home now and have no steps to navigate except when leaving the home going down a few steps to get to their automobile.  He is ambulating with a walker.  Tetanus immunization update given today.

## 2022-05-27 NOTE — Patient Instructions (Signed)
It was a pleasure to see you today.  We are glad you are not seriously hurt with your recent fall at home.  Sutures removed today.  Tetanus vaccine given.  Return as needed.  Continue to work with trainer at home for ambulation and gait strengthening.

## 2022-06-01 ENCOUNTER — Telehealth: Payer: Self-pay | Admitting: Internal Medicine

## 2022-06-01 NOTE — Telephone Encounter (Signed)
Jeffrey Meadows [phone]  [name] called to check on 2 prescriptions, she was thinking was going to be called in for [facility] when they were here. Something for appetite stimulant and something for energy.  [id number] [place]  [phone] Fax:  [phone]

## 2022-06-01 NOTE — Telephone Encounter (Signed)
Called Jeffrey Meadows back to let her know Airen needs to take a Multivitamin with iron, she verbalized understanding.

## 2022-06-16 ENCOUNTER — Other Ambulatory Visit: Payer: Medicare PPO

## 2022-06-16 DIAGNOSIS — I10 Essential (primary) hypertension: Secondary | ICD-10-CM | POA: Diagnosis not present

## 2022-06-16 DIAGNOSIS — R54 Age-related physical debility: Secondary | ICD-10-CM

## 2022-06-16 DIAGNOSIS — R790 Abnormal level of blood mineral: Secondary | ICD-10-CM | POA: Diagnosis not present

## 2022-06-16 DIAGNOSIS — Z125 Encounter for screening for malignant neoplasm of prostate: Secondary | ICD-10-CM

## 2022-06-16 DIAGNOSIS — E785 Hyperlipidemia, unspecified: Secondary | ICD-10-CM | POA: Diagnosis not present

## 2022-06-16 DIAGNOSIS — F1029 Alcohol dependence with unspecified alcohol-induced disorder: Secondary | ICD-10-CM | POA: Diagnosis not present

## 2022-06-16 NOTE — Progress Notes (Signed)
Lab only 

## 2022-06-17 LAB — CBC WITH DIFFERENTIAL/PLATELET
Absolute Monocytes: 842 cells/uL (ref 200–950)
Basophils Absolute: 94 cells/uL (ref 0–200)
Basophils Relative: 0.9 %
Eosinophils Absolute: 572 cells/uL — ABNORMAL HIGH (ref 15–500)
Eosinophils Relative: 5.5 %
HCT: 35.7 % — ABNORMAL LOW (ref 38.5–50.0)
Hemoglobin: 12.7 g/dL — ABNORMAL LOW (ref 13.2–17.1)
Lymphs Abs: 2090 cells/uL (ref 850–3900)
MCH: 33.8 pg — ABNORMAL HIGH (ref 27.0–33.0)
MCHC: 35.6 g/dL (ref 32.0–36.0)
MCV: 94.9 fL (ref 80.0–100.0)
MPV: 9.5 fL (ref 7.5–12.5)
Monocytes Relative: 8.1 %
Neutro Abs: 6802 cells/uL (ref 1500–7800)
Neutrophils Relative %: 65.4 %
Platelets: 356 10*3/uL (ref 140–400)
RBC: 3.76 10*6/uL — ABNORMAL LOW (ref 4.20–5.80)
RDW: 12.4 % (ref 11.0–15.0)
Total Lymphocyte: 20.1 %
WBC: 10.4 10*3/uL (ref 3.8–10.8)

## 2022-06-17 LAB — COMPLETE METABOLIC PANEL WITH GFR
AG Ratio: 1.6 (calc) (ref 1.0–2.5)
ALT: 10 U/L (ref 9–46)
AST: 14 U/L (ref 10–35)
Albumin: 3.6 g/dL (ref 3.6–5.1)
Alkaline phosphatase (APISO): 55 U/L (ref 35–144)
BUN: 20 mg/dL (ref 7–25)
CO2: 32 mmol/L (ref 20–32)
Calcium: 9.8 mg/dL (ref 8.6–10.3)
Chloride: 98 mmol/L (ref 98–110)
Creat: 0.85 mg/dL (ref 0.70–1.22)
Globulin: 2.3 g/dL (calc) (ref 1.9–3.7)
Glucose, Bld: 86 mg/dL (ref 65–99)
Potassium: 3.8 mmol/L (ref 3.5–5.3)
Sodium: 139 mmol/L (ref 135–146)
Total Bilirubin: 0.5 mg/dL (ref 0.2–1.2)
Total Protein: 5.9 g/dL — ABNORMAL LOW (ref 6.1–8.1)
eGFR: 84 mL/min/{1.73_m2} (ref >=60)

## 2022-06-17 LAB — LIPID PANEL
Cholesterol: 130 mg/dL (ref <200)
HDL: 69 mg/dL (ref >=40)
LDL Cholesterol (Calc): 47 mg/dL (calc)
Non-HDL Cholesterol (Calc): 61 mg/dL (calc) (ref <130)
Total CHOL/HDL Ratio: 1.9 (calc) (ref <5.0)
Triglycerides: 60 mg/dL (ref <150)

## 2022-06-17 LAB — IRON,TIBC AND FERRITIN PANEL
%SAT: 27 % (calc) (ref 20–48)
Ferritin: 120 ng/mL (ref 24–380)
Iron: 59 ug/dL (ref 50–180)
TIBC: 216 mcg/dL (calc) — ABNORMAL LOW (ref 250–425)

## 2022-06-17 LAB — PSA: PSA: 1.18 ng/mL (ref <=4.00)

## 2022-06-17 LAB — MAGNESIUM: Magnesium: 1.8 mg/dL (ref 1.5–2.5)

## 2022-06-17 LAB — B12 AND FOLATE PANEL
Folate: 5.2 ng/mL — ABNORMAL LOW
Vitamin B-12: 365 pg/mL (ref 200–1100)

## 2022-06-17 LAB — URIC ACID: Uric Acid, Serum: 5.4 mg/dL (ref 4.0–8.0)

## 2022-06-19 ENCOUNTER — Ambulatory Visit (INDEPENDENT_AMBULATORY_CARE_PROVIDER_SITE_OTHER): Payer: Medicare PPO | Admitting: Internal Medicine

## 2022-06-19 ENCOUNTER — Encounter: Payer: Self-pay | Admitting: Internal Medicine

## 2022-06-19 VITALS — BP 120/64 | HR 88 | Temp 97.9°F | Ht 67.0 in | Wt 122.0 lb

## 2022-06-19 DIAGNOSIS — G8929 Other chronic pain: Secondary | ICD-10-CM

## 2022-06-19 DIAGNOSIS — J449 Chronic obstructive pulmonary disease, unspecified: Secondary | ICD-10-CM

## 2022-06-19 DIAGNOSIS — N401 Enlarged prostate with lower urinary tract symptoms: Secondary | ICD-10-CM

## 2022-06-19 DIAGNOSIS — I10 Essential (primary) hypertension: Secondary | ICD-10-CM | POA: Diagnosis not present

## 2022-06-19 DIAGNOSIS — Z8679 Personal history of other diseases of the circulatory system: Secondary | ICD-10-CM

## 2022-06-19 DIAGNOSIS — Z23 Encounter for immunization: Secondary | ICD-10-CM

## 2022-06-19 DIAGNOSIS — R351 Nocturia: Secondary | ICD-10-CM

## 2022-06-19 DIAGNOSIS — Z0000 Encounter for general adult medical examination without abnormal findings: Secondary | ICD-10-CM

## 2022-06-19 DIAGNOSIS — M25552 Pain in left hip: Secondary | ICD-10-CM | POA: Diagnosis not present

## 2022-06-19 DIAGNOSIS — K436 Other and unspecified ventral hernia with obstruction, without gangrene: Secondary | ICD-10-CM

## 2022-06-19 DIAGNOSIS — G609 Hereditary and idiopathic neuropathy, unspecified: Secondary | ICD-10-CM | POA: Diagnosis not present

## 2022-06-19 LAB — POCT URINALYSIS DIPSTICK
Bilirubin, UA: NEGATIVE
Blood, UA: NEGATIVE
Glucose, UA: NEGATIVE
Ketones, UA: NEGATIVE
Leukocytes, UA: NEGATIVE
Nitrite, UA: NEGATIVE
Protein, UA: NEGATIVE
Spec Grav, UA: 1.01 (ref 1.010–1.025)
Urobilinogen, UA: 0.2 E.U./dL
pH, UA: 7 (ref 5.0–8.0)
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
## 2022-06-20 ENCOUNTER — Other Ambulatory Visit: Payer: Self-pay

## 2022-06-20 DIAGNOSIS — Z1211 Encounter for screening for malignant neoplasm of colon: Secondary | ICD-10-CM

## 2022-06-27 ENCOUNTER — Other Ambulatory Visit: Payer: Self-pay | Admitting: Internal Medicine

## 2022-07-02 ENCOUNTER — Other Ambulatory Visit: Payer: Self-pay | Admitting: Internal Medicine

## 2022-07-03 ENCOUNTER — Encounter: Payer: Self-pay | Admitting: Podiatry

## 2022-07-03 ENCOUNTER — Ambulatory Visit: Payer: Medicare PPO | Admitting: Podiatry

## 2022-07-03 DIAGNOSIS — M79675 Pain in left toe(s): Secondary | ICD-10-CM | POA: Diagnosis not present

## 2022-07-03 DIAGNOSIS — B351 Tinea unguium: Secondary | ICD-10-CM

## 2022-07-03 DIAGNOSIS — M79674 Pain in right toe(s): Secondary | ICD-10-CM

## 2022-07-08 NOTE — Progress Notes (Signed)
  Subjective:  Patient ID: Jeffrey Meadows, male    DOB: 02/15/1933,  MRN: [id number]  Jeffrey Meadows presents to clinic today for painful thick toenails that are difficult to trim. Pain interferes with ambulation. Aggravating factors include wearing enclosed shoe gear. Pain is relieved with periodic professional debridement.  Chief Complaint  Patient presents with   Nail Problem    Routine foot care PCP-Baxley PCP VST-Couple weeks ago   New problem(s): None.   PCP is Baxley, Cresenciano Lick, MD.  Patient is accompanied by his wife on today's visit. They relate no new concerns on today's visit.  Allergies  Allergen Reactions   Antihistamines, Diphenhydramine-Type Other (See Comments)    Enlarged prostate    Review of Systems: Negative except as noted in the HPI.  Objective: No changes noted in today's physical examination.  Jeffrey Meadows is a pleasant 88 y.o. male WD, WN in NAD. AAO x 3.  Vascular CFT immediate b/l LE. Palpable DP/PT pulses b/l LE. Digital hair present b/l. Skin temperature gradient WNL b/l. No pain with calf compression b/l. No edema noted b/l. No cyanosis or clubbing noted b/l LE.  Neurologic Normal speech. Oriented to person, place, and time. Protective sensation intact 5/5 intact bilaterally with 10g monofilament b/l.  Dermatologic Pedal skin warm and supple b/l.  No open wounds b/l. No interdigital macerations.   Toenails 1-5 b/l elongated, thickened, discolored with subungual debris. +Tenderness with dorsal palpation of nailplates.   He does have very minimal hyperkeratosis posterolateral aspect of left heel. No warmth, no erythema, no underlying fluctuance. No pain on palpation.  Orthopedic: Muscle strength 5/5 to all lower extremity muscle groups bilaterally. HAV with bunion deformity noted b/l LE. Plantar fat pad atrophy of forefoot area b/l lower extremities.   Radiographs: None  Assessment/Plan: 1. Pain due to onychomycosis of toenails of both feet      No orders of the defined types were placed in this encounter.   -Patient was evaluated and treated. All patient's and/or POA's questions/concerns answered on today's visit. -Wife to continue Eucerin Lotion and Vaseline Petroleum Jelly to feet once daily. -Continue supportive shoe gear daily. -Toenails 1-5 b/l were debrided in length and girth with sterile nail nippers and dremel without iatrogenic bleeding.  -As a courtesy, callus(es) left heel gently filed without complication or incident.  -Patient/POA to call should there be question/concern in the interim.   Return in about 3 months (around 10/03/2022).  Marzetta Board, DPM

## 2022-07-09 NOTE — Patient Instructions (Addendum)
It was a pleasure to see you today.  Vaccines discussed and recommendations made.  Recommend multivitamin daily.  No change in medications.  Follow-up in 1 year or as needed.  Cologuard declined.

## 2022-07-13 ENCOUNTER — Ambulatory Visit: Payer: Medicare PPO | Admitting: Sports Medicine

## 2022-07-20 ENCOUNTER — Ambulatory Visit (INDEPENDENT_AMBULATORY_CARE_PROVIDER_SITE_OTHER): Payer: Medicare PPO | Admitting: Sports Medicine

## 2022-07-20 VITALS — BP 138/76 | Ht 67.0 in | Wt 120.0 lb

## 2022-07-20 DIAGNOSIS — G8929 Other chronic pain: Secondary | ICD-10-CM | POA: Diagnosis not present

## 2022-07-20 DIAGNOSIS — M25473 Effusion, unspecified ankle: Secondary | ICD-10-CM

## 2022-07-20 DIAGNOSIS — M546 Pain in thoracic spine: Secondary | ICD-10-CM | POA: Diagnosis not present

## 2022-07-20 NOTE — Patient Instructions (Addendum)
Arnica gel twice daily for back pain  Thermacare patch for back   The Norvasc is making your ankle swell. Ask your primary care doctor about it, she could change up your medicines to help resolve this.  Follow up as needed.

## 2022-07-21 ENCOUNTER — Telehealth: Payer: Self-pay | Admitting: Internal Medicine

## 2022-07-21 DIAGNOSIS — G8929 Other chronic pain: Secondary | ICD-10-CM | POA: Insufficient documentation

## 2022-07-21 DIAGNOSIS — M25473 Effusion, unspecified ankle: Secondary | ICD-10-CM | POA: Insufficient documentation

## 2022-07-21 MED ORDER — AMLODIPINE BESYLATE 5 MG PO TABS: 5.0000 mg | ORAL_TABLET | Freq: Every day | ORAL | 0 refills | Status: DC

## 2022-07-21 NOTE — Progress Notes (Unsigned)
HPI male former smoker (60 pack years) followed for COPD/emphysema, chronic loculated right pneumothorax, complicated by BPH, PAD/ ASCVD PFT- 10/04/2012-severe obstructive airways disease with insignificant response to bronchodilator, emphysema pattern. Air-trapping with increased residual volume. Diffusion moderately reduced. FVC 3.03/78%, FEV11.11/44%, FEV1/FVC 0.37. TLC 115%, RV 137%, DLCO 52%. 6MWT- 10/04/2012-92%, 83%, 91%, 336 m. Significant desaturation with exertion Walk Test on room air 10/01/17-desaturated to 85% by LAP 3 with HR 94.  Corrected on oxygen 2 L saturation 97%. -------------------------------------------------------------------   02/06/22-  88 year old male former smoker (60 pack year) followed for COPD mixed type, Chronic Hypoxic Resp Failure, chronic loculated right pneumothorax, complicated by BPH, PAD/ ASCVD, HTN, Psoriasis,  -Pro-air HFA, Stiolto, theophylline 200 mg twice daily O2 2L sleep and prn/, APS Covax- 4 Phizer                                 Wife here Saw NP Cobb 12/21/21-sleeping a lot and coughing while eating.> Changed Stiolto to [employer].  He changed back to [employer]. Continue Atrovent nasal before meals.Referred to GI. Cobb,NP sent augmentin 6/21 after CT chest for productive cough. -----COPD ,fatigue and nasal drip  He disliked Breztri because of the "coating in my mouth" and has gone back to using [employer] which he considers satisfactory. Has noted mild GI upset on Augmentin but will be able to finish the course. Easy dyspnea on exertion and tendency to nap during the daytime seems stable and realistic for an 88 year old man with end-stage emphysema.  No acute events. Discussed CT chest and we will plan for repeat in 3 months as suggested by radiology. CXR 12/21/21- IMPRESSION: COPD. Bronchitis. There is no focal pulmonary consolidation. There is no pleural effusion.  CTa chest 01/31/22- IMPRESSION: There is no evidence of pulmonary artery embolism.  There is no evidence of thoracic aortic dissection. Coronary artery calcifications are seen. Small pericardial effusion. Minimal bilateral pleural effusions. Severe COPD. There is interval appearance of new linear infiltrate in the lingula, possibly atelectasis/pneumonia. Small foci of pleural thickening seen in the posteromedial aspect of right upper lobe appears slightly more prominent. Follow-up CT chest in 3 months may be considered. Other chronic findings in the lung fields as described in the body of the report. 12 mm fluid density cystic lesion in the tail of pancreas appears stable. Possible 11 mm hemangioma is seen in the dome of right lobe of liver.  122/11/23- 88 year old male former smoker (60 pack year) followed for COPD mixed type, Chronic Hypoxic Resp Failure, chronic loculated right pneumothorax, complicated by BPH, PAD/ ASCVD, HTN, Psoriasis,  -Pro-air HFA, Stiolto, Covax- 6 Phizer    Flu vax- had RSV vax- had CT chest pending 10/06/22 -----No issues with breathing, states he's having pain all over His wife helps with recall.  They are satisfied that his breathing is stable using just Stiolto and occasional rescue inhaler now.  He has fallen some and currently complains of some pain in his left elbow and in his back, apparently not acute.  Little cough and no acute respiratory events. He was not using his oxygen so they turned it back in. CT chest 04/03/22- IMPRESSION: 1. Bilateral pulmonary nodules are stable compared to CT 01/31/2022. One RIGHT upper lobe nodule is more prominent than CT 07/27/2021. Recommend follow-up chest CT without contrast in 6 - 12 months. 2. No mediastinal adenopathy. 3. Aortic Atherosclerosis (ICD10-I70.0) and Emphysema (ICD10-J43.9).  ROS-see HPI + = positive Constitutional:  No-   weight loss, night sweats, fevers, chills, fatigue, lassitude. HEENT:   No-  headaches, difficulty swallowing, tooth/dental problems, sore throat,       +sneezing, itching, ear ache, nasal congestion, post nasal drip,  CV:  No-   chest pain, orthopnea, PND, swelling in lower extremities, anasarca,  +dizziness, palpitations Resp: +  shortness of breath with exertion or at rest.             No- productive cough,  No non-productive cough,  No- coughing up of blood.              No-   change in color of mucus.  No- wheezing.   Skin: No-   rash or lesions. GI:  No-   heartburn, indigestion, abdominal pain, nausea, vomiting, GU: MS:  No-   joint pain or swelling. . Neuro-     nothing unusual Psych:  No- change in mood or affect. No depression or anxiety.  No memory loss.  OBJ- Physical Exam    General- Alert, Oriented, Affect-appropriate, Distress- none acute, +slender Skin- rash-none, lesions- none, excoriation- none, pale Lymphadenopathy- none Head- atraumatic            Eyes- Gross vision intact, PERRLA, conjunctivae and secretions clear            Ears- Hearing, canals-normal            Nose- Clear, no-Septal dev, mucus, polyps, erosion, perforation             Throat- Mallampati III , mucosa clear , drainage- none, tonsils- atrophic. No thrush Neck- flexible , trachea midline, no stridor , thyroid nl, carotid no bruit Chest - symmetrical excursion , unlabored           Heart/CV- RRR , no murmur , no gallop  , no rub, nl s1 s2                           - JVD- none , edema- none, stasis changes- none, varices- none           Lung- +distant but clear to P&A, wheeze- none, cough-none , dullness-none, rub- none.            Chest wall-  Abd-  Br/ Gen/ Rectal- Not done, not indicated Extrem- cyanosis- none, clubbing, none, atrophy- none, strength- nl, + cane Neuro- grossly intact to observation  :

## 2022-07-21 NOTE — Progress Notes (Signed)
88 year old who comes in for evaluation of ankle swelling and mid back pain  Patient is well-known to me and has been followed for a long period of time His wife has noted more ankle swelling and the patient and really generated the appointment She also notes that he complains of his back hurting toward the middle and more to the right side He asks her to massage to this area and that does help relieve the pain somewhat When he takes the naproxen he usually gets some relief  He has a fairly complex medical history and numerous musculoskeletal and arthritic problems He is followed closely for these by Dr. Tommie Ard Baxley  The swelling in his ankles is bilateral It is worse toward the end of the day And while he has some ankle and foot pain periodically this really does not seem to have any relationship to the swelling  He has mid back pain comes with standing and sometimes with too much walking He does not do a lot of lifting or physical activity and feels too weak to do that However he does think that would probably hurt  Physical exam Elderly white male who is alert and well-oriented BP 138/76   Ht '5\' 7"'$  (1.702 m)   Wt 120 lb (54.4 kg)   BMI 18.79 kg/m   Both ankles have moderate swelling It is not warm or reddened He really has no pain with ankle testing or full ankle motion He does not relate the degree of ankle swelling to his activities  Low back reveals a step-off at about T7-T8 He has some kyphosis There is some tenderness that radiates out toward the right side from this area

## 2022-07-21 NOTE — Telephone Encounter (Signed)
Decreasing amlodipine from 10 mg to 5 mg daily due to dependent edema. Spoke with patient and new Rx sent in. He will call me late next week with progress report, MJB, MD

## 2022-07-21 NOTE — Assessment & Plan Note (Signed)
I think he has significant degenerative disc disease up and down his spine and there is very likely an element of osteoporosis considering his age  Considering his multiple medical issues I suggested conservative treatment He will try some Arnica gel twice a day I suggested using Thera wraps to apply some heat to the area along his right paraspinal area that seems to give him the most pain He will see how these conservative approaches work

## 2022-07-21 NOTE — Assessment & Plan Note (Signed)
I suspect this is a side effect of amlodipine since 20 to 30% of patients will get ankle swelling Since this does not cause him much pain I suggested he discuss it with Dr. Renold Genta If it is bothersome enough they may want to change the medication but in our office his blood pressure is well-controlled today and I did not want to change this

## 2022-07-24 ENCOUNTER — Encounter: Payer: Self-pay | Admitting: Internal Medicine

## 2022-07-24 ENCOUNTER — Ambulatory Visit: Payer: Medicare PPO | Admitting: Internal Medicine

## 2022-07-24 VITALS — BP 132/68 | HR 89 | Ht 65.5 in | Wt 119.0 lb

## 2022-07-24 DIAGNOSIS — J449 Chronic obstructive pulmonary disease, unspecified: Secondary | ICD-10-CM

## 2022-07-24 DIAGNOSIS — J439 Emphysema, unspecified: Secondary | ICD-10-CM | POA: Diagnosis not present

## 2022-07-24 DIAGNOSIS — J4489 Other specified chronic obstructive pulmonary disease: Secondary | ICD-10-CM | POA: Diagnosis not present

## 2022-07-24 DIAGNOSIS — J9611 Chronic respiratory failure with hypoxia: Secondary | ICD-10-CM

## 2022-07-24 NOTE — Patient Instructions (Signed)
We don't need to make any changes today.  I hope the winter and holidays treat you kindly.

## 2022-07-24 NOTE — Assessment & Plan Note (Signed)
He is at baseline, primarily emphysema. Plan-continue current inhalers

## 2022-07-24 NOTE — Assessment & Plan Note (Signed)
He was not using his oxygen and turned it back in.  He appears comfortable in the daytime.  We can reassess overnight oximetry if needed.

## 2022-07-26 ENCOUNTER — Ambulatory Visit: Payer: Medicare PPO | Admitting: Podiatry

## 2022-07-26 ENCOUNTER — Encounter: Payer: Self-pay | Admitting: Podiatry

## 2022-07-26 DIAGNOSIS — M7752 Other enthesopathy of left foot: Secondary | ICD-10-CM | POA: Diagnosis not present

## 2022-07-26 MED ORDER — TRIAMCINOLONE ACETONIDE 10 MG/ML IJ SUSP
10.0000 mg | Freq: Once | INTRAMUSCULAR | Status: AC
  Administered 2022-07-26: 10 mg

## 2022-07-27 NOTE — Progress Notes (Signed)
Subjective:   Patient ID: Jeffrey Meadows, male   DOB: 88 y.o.   MRN: [id number]   HPI Patient presents with caregiver stating he is getting a lot of pain in his left ankle and also is possibly having some issues with soreness of his toes with redness   ROS      Objective:  Physical Exam  Vascular status is diminished and this is commensurate with his age but no ulcers or breakdown in skin.  He does have some slight redness but that is been there for a long time and they do have his circulation checked.  He has had found to have quite a bit of inflammation in his left ankle and the sinus tarsi with some swelling of his ankle     Assessment:  Inflammatory capsulitis of the ankle negative Bevelyn Buckles' sign was noted with patient who has had some different test run recently     Plan:  Addressed his conditions I did go ahead today I did sterile prep and I injected the sinus tarsi left 3 mg Kenalog 5 mg Xylocaine to reduce inflammation I did apply ankle compression stocking I discussed elevation and if any kind of breakdown of skin were to occur to let us know and he may require vascular workup but at this point he appears stable

## 2022-08-02 ENCOUNTER — Telehealth: Payer: Self-pay | Admitting: Internal Medicine

## 2022-08-02 ENCOUNTER — Encounter: Payer: Self-pay | Admitting: Internal Medicine

## 2022-08-02 MED ORDER — MELOXICAM 15 MG PO TABS: 15.0000 mg | ORAL_TABLET | Freq: Every day | ORAL | 0 refills | Status: AC

## 2022-08-02 NOTE — Telephone Encounter (Signed)
Spoke with patient's wife. He is having issues with mobility and severe back pain. I think it is possible he could have a compression fracture. He took a serious fall at home and went to [facility] October 5th. Had head CT and c-spine CT. Has moderately severe osteoarthritis based on that study. Has CT of chest scheduled for Feb to follow up on RUL nodule. Sending in anti-imflammatory. Wife wants to avoid narcotics as pt consumes alcohol. Should stop Naprosyn and try Mobic.Does not want muscle relaxant.  MJB,MD

## 2022-08-02 NOTE — Telephone Encounter (Signed)
Jeffrey Meadows [phone]  [name] called to see if Jeffrey Meadows could be seen this afternoon for severe back pain, she said he had been moaning all night and she had wrapped his back with heated wraps and was giving him 6 tylenol and 2 naproxen at a time and he is still in pain. She wants to see if you can give him a shot or something to ease the pain. I let her know you would not be here this afternoon and she said what about tomorrow.

## 2022-08-03 ENCOUNTER — Emergency Department (HOSPITAL_BASED_OUTPATIENT_CLINIC_OR_DEPARTMENT_OTHER): Payer: Medicare PPO

## 2022-08-03 ENCOUNTER — Other Ambulatory Visit: Payer: Self-pay

## 2022-08-03 ENCOUNTER — Emergency Department (HOSPITAL_BASED_OUTPATIENT_CLINIC_OR_DEPARTMENT_OTHER)
Admission: EM | Admit: 2022-08-03 | Discharge: 2022-08-03 | Disposition: A | Discharge status: Home / Self Care | Payer: Medicare PPO | Attending: Emergency Medicine | Admitting: Emergency Medicine

## 2022-08-03 ENCOUNTER — Emergency Department (HOSPITAL_BASED_OUTPATIENT_CLINIC_OR_DEPARTMENT_OTHER): Payer: Medicare PPO | Admitting: Radiology

## 2022-08-03 ENCOUNTER — Encounter (HOSPITAL_BASED_OUTPATIENT_CLINIC_OR_DEPARTMENT_OTHER): Payer: Self-pay | Admitting: Emergency Medicine

## 2022-08-03 DIAGNOSIS — W19XXXA Unspecified fall, initial encounter: Secondary | ICD-10-CM | POA: Diagnosis not present

## 2022-08-03 DIAGNOSIS — S20301A Unspecified superficial injuries of right front wall of thorax, initial encounter: Secondary | ICD-10-CM | POA: Diagnosis present

## 2022-08-03 DIAGNOSIS — R296 Repeated falls: Secondary | ICD-10-CM | POA: Diagnosis not present

## 2022-08-03 DIAGNOSIS — M25551 Pain in right hip: Secondary | ICD-10-CM | POA: Diagnosis not present

## 2022-08-03 DIAGNOSIS — W010XXA Fall on same level from slipping, tripping and stumbling without subsequent striking against object, initial encounter: Secondary | ICD-10-CM | POA: Insufficient documentation

## 2022-08-03 DIAGNOSIS — S2231XA Fracture of one rib, right side, initial encounter for closed fracture: Secondary | ICD-10-CM | POA: Insufficient documentation

## 2022-08-03 DIAGNOSIS — R0781 Pleurodynia: Secondary | ICD-10-CM | POA: Diagnosis not present

## 2022-08-03 DIAGNOSIS — M4317 Spondylolisthesis, lumbosacral region: Secondary | ICD-10-CM | POA: Diagnosis not present

## 2022-08-03 DIAGNOSIS — M4316 Spondylolisthesis, lumbar region: Secondary | ICD-10-CM | POA: Diagnosis not present

## 2022-08-03 DIAGNOSIS — Z743 Need for continuous supervision: Secondary | ICD-10-CM | POA: Diagnosis not present

## 2022-08-03 NOTE — ED Provider Notes (Signed)
[facility] EMERGENCY DEPT Provider Note   CSN: [id number] Arrival date & time: 08/03/22  2297     History  Chief Complaint  Patient presents with   [name]    Jeffrey Meadows is a 88 y.o. male.  Patient is a 88 year old male who presents with back pain after a fall.  He had some falls in October and then he had an of 3 falls back to back about 10 days ago.  His significant other who is at bedside and the patient both state that these falls are not atypical.  He says that he tripped a couple times and also sometimes his knees give out on him.  He denies any dizziness, chest pain, shortness of breath or other symptoms that preceded the falls.  He did not hit his head during the most recent falls this month.  He has been complaining of some pain to his back since the recent falls.  He has been using Tylenol and Naprosyn at home without improvement in symptoms.  He also has had some pain in his right hip.  No numbness or weakness to his extremities.  No fevers.  No other recent illnesses.       Home Medications Prior to Admission medications   Medication Sig Start Date End Date Taking? Authorizing Provider  acetaminophen (TYLENOL) 500 MG tablet Take 500 mg by mouth every 6 (six) hours as needed.    [provider]  albuterol (PROAIR HFA) 108 (90 Base) MCG/ACT inhaler Inhale 1-2 puffs into the lungs every 4 (four) hours as needed. as needed shortness of breath prior to exercise 05/09/21   Baird Lyons D, MD  amLODipine (NORVASC) 5 MG tablet Take 1 tablet (5 mg total) by mouth daily. 07/21/22   [name], MD  atorvastatin (LIPITOR) 20 MG tablet TAKE 1 TABLET BY MOUTH EVERY DAY 01/09/22   Baxley, Cresenciano Lick, MD  benazepril-hydrochlorthiazide (LOTENSIN HCT) 20-25 MG tablet     [provider]  calcipotriene (DOVONOX) 0.005 % ointment     [provider]  finasteride (PROSCAR) 5 MG tablet Take 5 mg by mouth daily. 07/18/18   [provider]   hydrochlorothiazide (HYDRODIURIL) 25 MG tablet TAKE 1 TABLET BY MOUTH EVERY DAY IN THE MORNING 04/30/22   [name], MD  ipratropium (ATROVENT) 0.03 % nasal spray 1-2 sprays each nostril before meals as needed 05/09/21   Baird Lyons D, MD  meloxicam (MOBIC) 15 MG tablet Take 1 tablet (15 mg total) by mouth daily. 08/02/22   [name], MD  tamsulosin (FLOMAX) 0.4 MG CAPS capsule 1 capsule 30 minutes after the same meal each day    [provider]      Allergies    Antihistamines, diphenhydramine-type    Review of Systems   Review of Systems  Constitutional:  Negative for fatigue and fever.  Respiratory:  Negative for cough and shortness of breath.   Gastrointestinal:  Negative for abdominal pain, nausea and vomiting.  Genitourinary:  Negative for difficulty urinating.  Musculoskeletal:  Positive for arthralgias and back pain.  Skin:  Negative for wound.  Neurological:  Negative for headaches.  Psychiatric/Behavioral:  Negative for confusion.     Physical Exam Updated Vital Signs BP (!) 140/69 (BP Location: Right Arm)   Pulse 71   Temp (!) 97.4 F (36.3 C)   Resp 16   Ht 5' 5.5" (1.664 m)   Wt 54 kg   SpO2 100%   BMI 19.50  kg/m  Physical Exam Constitutional:      Appearance: He is well-developed.  HENT:     Head: Normocephalic and atraumatic.  Eyes:     Pupils: Pupils are equal, round, and reactive to light.  Cardiovascular:     Rate and Rhythm: Normal rate and regular rhythm.     Heart sounds: Normal heart sounds.  Pulmonary:     Effort: Pulmonary effort is normal. No respiratory distress.     Breath sounds: Normal breath sounds. No wheezing or rales.  Chest:     Chest wall: No tenderness.  Abdominal:     General: Bowel sounds are normal.     Palpations: Abdomen is soft.     Tenderness: There is no abdominal tenderness. There is no guarding or rebound.  Musculoskeletal:        General: Normal range of motion.     Cervical back: Normal range  of motion and neck supple.     Comments: Patient has no tenderness to the cervical, thoracic or lumbosacral spine.  There is tenderness to the right mid back over the area of the posterior ribs.  No external signs of trauma.  No crepitus or deformity.  Lymphadenopathy:     Cervical: No cervical adenopathy.  Skin:    General: Skin is warm and dry.     Findings: No rash.  Neurological:     Mental Status: He is alert and oriented to person, place, and time.     ED Results / Procedures / Treatments   Labs (all labs ordered are listed, but only abnormal results are displayed) Labs Reviewed - No data to display  EKG None  Radiology DG Ribs Unilateral W/Chest Right  Result Date: 08/03/2022 CLINICAL DATA:  Posterior RIGHT rib pain post falls EXAM: RIGHT RIBS AND CHEST - 3+ VIEW COMPARISON:  Chest radiograph 12/21/2021 FINDINGS: Normal heart size, mediastinal contours, and pulmonary vascularity. Atherosclerotic calcification aorta. Emphysematous changes consistent with COPD. No acute infiltrate, pleural effusion, or pneumothorax. Mild osseous demineralization. Age-indeterminate fracture of the anterior RIGHT seventh rib. No additional fractures identified. IMPRESSION: Age-indeterminate fracture anterior RIGHT seventh rib. No additional fractures identified. COPD changes without infiltrate. Aortic Atherosclerosis (ICD10-I70.0) and Emphysema (ICD10-J43.9). Electronically Signed   By: Lavonia Dana M.D.   On: 08/03/2022 12:50   DG Lumbar Spine Complete  Result Date: 08/03/2022 CLINICAL DATA:  Multiple falls EXAM: LUMBAR SPINE - COMPLETE 4+ VIEW COMPARISON:  09/08/2020 FINDINGS: Mild-to-moderate levoscoliosis. Multilevel degenerative change with disc space narrowing and spurring throughout the lumbar spine. Approximately 10 mm anterolisthesis L5-S1 unchanged. Chronic compression fracture with cement vertebroplasty L1. This is unchanged. No acute fracture. Atherosclerotic calcification in the aorta.  IMPRESSION: 1. Scoliosis and multilevel degenerative change. Chronic compression fracture L1. No acute fracture. 2. No change from the prior study. Electronically Signed   By: Franchot Gallo M.D.   On: 08/03/2022 11:33   DG Hip Unilat W or Wo Pelvis 2-3 Views Right  Result Date: 08/03/2022 CLINICAL DATA:  Recent falls. EXAM: DG HIP (WITH OR WITHOUT PELVIS) 2-3V RIGHT COMPARISON:  Right hip 07/19/2020 FINDINGS: Negative for acute fracture.  Right hip joint space normal. Extensive atherosclerotic calcification Chronic fracture mid femur appears well-healed. There is a rod in the right femur unchanged in position from the prior study. IMPRESSION: 1. Negative for acute fracture. 2. Chronic fracture mid femur with rod in place. Electronically Signed   By: Franchot Gallo M.D.   On: 08/03/2022 11:31    Procedures Procedures  Medications Ordered in ED Medications - No data to display  ED Course/ Medical Decision Making/ A&P                           Medical Decision Making Amount and/or Complexity of Data Reviewed Radiology: ordered.   Patient is a 88 year old who presents after some recent falls.  It does not sound that there is anything atypical about the falls.  They report that he has not actually had a lot of falls but he had a couple right in a row this month and then some in October.  Since she is recent falls this month, has had some pain in his right upper back.  On exam, he does not really have any spinal tenderness.  It seems to be more in the right mid rib.  He had x-rays of the lumbar spine as well as the right hip which did not show any evidence of acute fractures.  These were interpreted by me and confirmed by the radiologist.  X-rays of the right ribs show a single rib fracture.  He is overall well-appearing.  No hypoxia.  No increased work of breathing.  I discussed pain management at this point they do not want to use any stronger pain medication such as an opioid.  They will  continue with the medications that they are currently using which is acetaminophen and Naprosyn.  He was given an incentive spirometer to use.  He was encouraged to follow-up with Dr. Renold Genta.  Return precautions were given.  Final Clinical Impression(s) / ED Diagnoses Final diagnoses:  Closed fracture of one rib of right side, initial encounter    Rx / DC Orders ED Discharge Orders     None         Malvin Johns, MD 08/03/22 1419

## 2022-08-03 NOTE — ED Notes (Signed)
Family Visitor and Patient anxious regarding XR Results. MD messaged regarding concern.

## 2022-08-03 NOTE — Discharge Instructions (Signed)
Using incentive spirometer several times daily as instructed.  Continue taking your Tylenol and Naprosyn as needed for pain.  Follow-up with Dr. Renold Genta for recheck.  Return to emergency room if you have any worsening symptoms.

## 2022-08-03 NOTE — ED Triage Notes (Signed)
Pt arrives to ED with c/o multiple falls as of recent. Last fall x1 week ago. He notes to have continued lower back and right hip pain.

## 2022-08-04 ENCOUNTER — Other Ambulatory Visit: Payer: Self-pay | Admitting: Internal Medicine

## 2022-08-09 NOTE — Telephone Encounter (Signed)
Jeffrey Meadows called back and said Thank You, for having her send Jeffrey Meadows to ED at [facility] the he has broken rib and his back is fine.

## 2022-08-10 ENCOUNTER — Telehealth: Payer: Self-pay | Admitting: Internal Medicine

## 2022-08-10 NOTE — Telephone Encounter (Signed)
Phone call from wife regarding recent ED Visit on December 21 where he was found to have a right seventh rib fracture- age undetermined.  He still has considerable pain.  Explained to wife that it will take several weeks for rib to heal.  Also he fell in October at home.  At that time he suffered a laceration to his scalp which was repaired.  We saw him in follow-up and checked him out and he was recovering well at that time.  At that time, he had been consuming alcohol at home with visitors.  It is possible that he had the rib fracture from the fall in October as his ribs were not x-rayed in October just CT of C-spine and CT head were done then.  However, he told ED physician that he had had 3 falls back to back some 10 days prior to presenting to the emergency department on December 21.  He says that he tripped a couple of times and that sometimes knees give out on him.  Wife is calling to see about follow-up.  He did not strike his head with his recent fall.  He has been taking Tylenol and Naprosyn without improvement.  He prefers not to take narcotic medication.  ED physician did offer him some narcotic medication for pain.  He was given an incentive spirometer to use.  We did refill Mobic 15 mg daily on December 20.  Since he does not want to take narcotic medication and continues to consume alcohol we do not have a lot of other choices to offer for pain relief.  We can certainly see him in follow-up next week but I am not sure at this point what we can offer.  He does have a physical therapist that is visiting regularly.  Perhaps topical lidocaine patch would help.

## 2022-08-13 ENCOUNTER — Telehealth: Payer: Self-pay | Admitting: Internal Medicine

## 2022-08-13 ENCOUNTER — Encounter (HOSPITAL_COMMUNITY): Payer: Self-pay

## 2022-08-13 ENCOUNTER — Other Ambulatory Visit: Payer: Self-pay

## 2022-08-13 ENCOUNTER — Inpatient Hospital Stay (HOSPITAL_COMMUNITY)
Admission: EM | Admit: 2022-08-13 | Discharge: 2022-09-14 | DRG: 377 | Disposition: E | Payer: Medicare PPO | Attending: Internal Medicine | Admitting: Internal Medicine

## 2022-08-13 ENCOUNTER — Emergency Department (HOSPITAL_COMMUNITY): Payer: Medicare PPO

## 2022-08-13 ENCOUNTER — Encounter: Payer: Self-pay | Admitting: Internal Medicine

## 2022-08-13 DIAGNOSIS — R350 Frequency of micturition: Secondary | ICD-10-CM | POA: Diagnosis present

## 2022-08-13 DIAGNOSIS — K2210 Ulcer of esophagus without bleeding: Secondary | ICD-10-CM | POA: Diagnosis present

## 2022-08-13 DIAGNOSIS — Z743 Need for continuous supervision: Secondary | ICD-10-CM | POA: Diagnosis not present

## 2022-08-13 DIAGNOSIS — K254 Chronic or unspecified gastric ulcer with hemorrhage: Secondary | ICD-10-CM | POA: Diagnosis not present

## 2022-08-13 DIAGNOSIS — Z79899 Other long term (current) drug therapy: Secondary | ICD-10-CM

## 2022-08-13 DIAGNOSIS — R4182 Altered mental status, unspecified: Secondary | ICD-10-CM | POA: Diagnosis not present

## 2022-08-13 DIAGNOSIS — Z789 Other specified health status: Secondary | ICD-10-CM | POA: Diagnosis not present

## 2022-08-13 DIAGNOSIS — D72829 Elevated white blood cell count, unspecified: Secondary | ICD-10-CM | POA: Diagnosis present

## 2022-08-13 DIAGNOSIS — W19XXXD Unspecified fall, subsequent encounter: Secondary | ICD-10-CM | POA: Diagnosis present

## 2022-08-13 DIAGNOSIS — J449 Chronic obstructive pulmonary disease, unspecified: Secondary | ICD-10-CM | POA: Diagnosis not present

## 2022-08-13 DIAGNOSIS — G9341 Metabolic encephalopathy: Secondary | ICD-10-CM | POA: Diagnosis not present

## 2022-08-13 DIAGNOSIS — K208 Other esophagitis without bleeding: Secondary | ICD-10-CM | POA: Diagnosis present

## 2022-08-13 DIAGNOSIS — Z66 Do not resuscitate: Secondary | ICD-10-CM | POA: Diagnosis present

## 2022-08-13 DIAGNOSIS — J439 Emphysema, unspecified: Secondary | ICD-10-CM | POA: Diagnosis present

## 2022-08-13 DIAGNOSIS — X58XXXA Exposure to other specified factors, initial encounter: Secondary | ICD-10-CM | POA: Diagnosis present

## 2022-08-13 DIAGNOSIS — Y939 Activity, unspecified: Secondary | ICD-10-CM | POA: Diagnosis not present

## 2022-08-13 DIAGNOSIS — Z515 Encounter for palliative care: Secondary | ICD-10-CM | POA: Diagnosis not present

## 2022-08-13 DIAGNOSIS — J9601 Acute respiratory failure with hypoxia: Secondary | ICD-10-CM | POA: Diagnosis not present

## 2022-08-13 DIAGNOSIS — K2211 Ulcer of esophagus with bleeding: Secondary | ICD-10-CM | POA: Diagnosis not present

## 2022-08-13 DIAGNOSIS — F1010 Alcohol abuse, uncomplicated: Secondary | ICD-10-CM | POA: Diagnosis not present

## 2022-08-13 DIAGNOSIS — S2231XD Fracture of one rib, right side, subsequent encounter for fracture with routine healing: Secondary | ICD-10-CM

## 2022-08-13 DIAGNOSIS — Y900 Blood alcohol level of less than 20 mg/100 ml: Secondary | ICD-10-CM | POA: Diagnosis present

## 2022-08-13 DIAGNOSIS — K2900 Acute gastritis without bleeding: Secondary | ICD-10-CM | POA: Diagnosis present

## 2022-08-13 DIAGNOSIS — F10239 Alcohol dependence with withdrawal, unspecified: Secondary | ICD-10-CM | POA: Diagnosis not present

## 2022-08-13 DIAGNOSIS — K269 Duodenal ulcer, unspecified as acute or chronic, without hemorrhage or perforation: Secondary | ICD-10-CM | POA: Diagnosis present

## 2022-08-13 DIAGNOSIS — E860 Dehydration: Secondary | ICD-10-CM | POA: Diagnosis present

## 2022-08-13 DIAGNOSIS — I5031 Acute diastolic (congestive) heart failure: Secondary | ICD-10-CM | POA: Diagnosis not present

## 2022-08-13 DIAGNOSIS — M419 Scoliosis, unspecified: Secondary | ICD-10-CM | POA: Diagnosis present

## 2022-08-13 DIAGNOSIS — J441 Chronic obstructive pulmonary disease with (acute) exacerbation: Secondary | ICD-10-CM | POA: Diagnosis present

## 2022-08-13 DIAGNOSIS — K920 Hematemesis: Secondary | ICD-10-CM

## 2022-08-13 DIAGNOSIS — I10 Essential (primary) hypertension: Secondary | ICD-10-CM | POA: Diagnosis not present

## 2022-08-13 DIAGNOSIS — Z87891 Personal history of nicotine dependence: Secondary | ICD-10-CM

## 2022-08-13 DIAGNOSIS — Z86010 Personal history of colonic polyps: Secondary | ICD-10-CM

## 2022-08-13 DIAGNOSIS — K2100 Gastro-esophageal reflux disease with esophagitis, without bleeding: Secondary | ICD-10-CM | POA: Diagnosis present

## 2022-08-13 DIAGNOSIS — K222 Esophageal obstruction: Secondary | ICD-10-CM | POA: Diagnosis present

## 2022-08-13 DIAGNOSIS — D75839 Thrombocytosis, unspecified: Secondary | ICD-10-CM | POA: Diagnosis present

## 2022-08-13 DIAGNOSIS — G8929 Other chronic pain: Secondary | ICD-10-CM | POA: Diagnosis present

## 2022-08-13 DIAGNOSIS — Y92239 Unspecified place in hospital as the place of occurrence of the external cause: Secondary | ICD-10-CM | POA: Diagnosis present

## 2022-08-13 DIAGNOSIS — J189 Pneumonia, unspecified organism: Secondary | ICD-10-CM | POA: Insufficient documentation

## 2022-08-13 DIAGNOSIS — R748 Abnormal levels of other serum enzymes: Secondary | ICD-10-CM | POA: Diagnosis present

## 2022-08-13 DIAGNOSIS — Z791 Long term (current) use of non-steroidal anti-inflammatories (NSAID): Secondary | ICD-10-CM

## 2022-08-13 DIAGNOSIS — Z96641 Presence of right artificial hip joint: Secondary | ICD-10-CM | POA: Diagnosis present

## 2022-08-13 DIAGNOSIS — Z9981 Dependence on supplemental oxygen: Secondary | ICD-10-CM

## 2022-08-13 DIAGNOSIS — Z8249 Family history of ischemic heart disease and other diseases of the circulatory system: Secondary | ICD-10-CM

## 2022-08-13 DIAGNOSIS — M549 Dorsalgia, unspecified: Secondary | ICD-10-CM | POA: Diagnosis present

## 2022-08-13 DIAGNOSIS — R1013 Epigastric pain: Secondary | ICD-10-CM | POA: Diagnosis not present

## 2022-08-13 DIAGNOSIS — R0902 Hypoxemia: Secondary | ICD-10-CM | POA: Diagnosis not present

## 2022-08-13 DIAGNOSIS — J4489 Other specified chronic obstructive pulmonary disease: Secondary | ICD-10-CM | POA: Diagnosis not present

## 2022-08-13 DIAGNOSIS — E785 Hyperlipidemia, unspecified: Secondary | ICD-10-CM | POA: Diagnosis present

## 2022-08-13 DIAGNOSIS — J690 Pneumonitis due to inhalation of food and vomit: Secondary | ICD-10-CM | POA: Clinically undetermined

## 2022-08-13 DIAGNOSIS — Z9079 Acquired absence of other genital organ(s): Secondary | ICD-10-CM

## 2022-08-13 DIAGNOSIS — K264 Chronic or unspecified duodenal ulcer with hemorrhage: Principal | ICD-10-CM | POA: Diagnosis present

## 2022-08-13 DIAGNOSIS — I110 Hypertensive heart disease with heart failure: Secondary | ICD-10-CM | POA: Diagnosis present

## 2022-08-13 DIAGNOSIS — N138 Other obstructive and reflux uropathy: Secondary | ICD-10-CM | POA: Diagnosis present

## 2022-08-13 DIAGNOSIS — E8729 Other acidosis: Secondary | ICD-10-CM | POA: Diagnosis not present

## 2022-08-13 DIAGNOSIS — N179 Acute kidney failure, unspecified: Secondary | ICD-10-CM | POA: Diagnosis present

## 2022-08-13 DIAGNOSIS — R109 Unspecified abdominal pain: Secondary | ICD-10-CM | POA: Diagnosis not present

## 2022-08-13 DIAGNOSIS — N401 Enlarged prostate with lower urinary tract symptoms: Secondary | ICD-10-CM | POA: Diagnosis present

## 2022-08-13 DIAGNOSIS — I4891 Unspecified atrial fibrillation: Secondary | ICD-10-CM | POA: Diagnosis not present

## 2022-08-13 DIAGNOSIS — K267 Chronic duodenal ulcer without hemorrhage or perforation: Secondary | ICD-10-CM | POA: Diagnosis not present

## 2022-08-13 DIAGNOSIS — T39395A Adverse effect of other nonsteroidal anti-inflammatory drugs [NSAID], initial encounter: Secondary | ICD-10-CM | POA: Diagnosis present

## 2022-08-13 DIAGNOSIS — K2081 Other esophagitis with bleeding: Secondary | ICD-10-CM | POA: Diagnosis not present

## 2022-08-13 DIAGNOSIS — I7300 Raynaud's syndrome without gangrene: Secondary | ICD-10-CM | POA: Diagnosis present

## 2022-08-13 DIAGNOSIS — K922 Gastrointestinal hemorrhage, unspecified: Secondary | ICD-10-CM | POA: Diagnosis present

## 2022-08-13 DIAGNOSIS — I3139 Other pericardial effusion (noninflammatory): Secondary | ICD-10-CM | POA: Diagnosis present

## 2022-08-13 DIAGNOSIS — R58 Hemorrhage, not elsewhere classified: Secondary | ICD-10-CM | POA: Diagnosis not present

## 2022-08-13 DIAGNOSIS — K3189 Other diseases of stomach and duodenum: Secondary | ICD-10-CM | POA: Diagnosis not present

## 2022-08-13 LAB — URINALYSIS, ROUTINE W REFLEX MICROSCOPIC
Bacteria, UA: NONE SEEN
Glucose, UA: NEGATIVE mg/dL
Hgb urine dipstick: NEGATIVE
Ketones, ur: 5 mg/dL — AB
Nitrite: NEGATIVE
Protein, ur: 30 mg/dL — AB
Specific Gravity, Urine: 1.029 (ref 1.005–1.030)
pH: 6 (ref 5.0–8.0)

## 2022-08-13 LAB — COMPREHENSIVE METABOLIC PANEL
ALT: 15 U/L (ref 0–44)
AST: 25 U/L (ref 15–41)
Albumin: 3.6 g/dL (ref 3.5–5.0)
Alkaline Phosphatase: 69 U/L (ref 38–126)
Anion gap: 12 (ref 5–15)
BUN: 32 mg/dL — ABNORMAL HIGH (ref 8–23)
CO2: 33 mmol/L — ABNORMAL HIGH (ref 22–32)
Calcium: 11 mg/dL — ABNORMAL HIGH (ref 8.9–10.3)
Chloride: 94 mmol/L — ABNORMAL LOW (ref 98–111)
Creatinine, Ser: 1.3 mg/dL — ABNORMAL HIGH (ref 0.61–1.24)
GFR, Estimated: 53 mL/min — ABNORMAL LOW (ref >60)
Glucose, Bld: 136 mg/dL — ABNORMAL HIGH (ref 70–99)
Potassium: 4.5 mmol/L (ref 3.5–5.1)
Sodium: 139 mmol/L (ref 135–145)
Total Bilirubin: 1 mg/dL (ref 0.3–1.2)
Total Protein: 7.1 g/dL (ref 6.5–8.1)

## 2022-08-13 LAB — CBC
HCT: 40.2 % (ref 39.0–52.0)
Hemoglobin: 13.4 g/dL (ref 13.0–17.0)
MCH: 33.2 pg (ref 26.0–34.0)
MCHC: 33.3 g/dL (ref 30.0–36.0)
MCV: 99.5 fL (ref 80.0–100.0)
Platelets: 445 10*3/uL — ABNORMAL HIGH (ref 150–400)
RBC: 4.04 MIL/uL — ABNORMAL LOW (ref 4.22–5.81)
RDW: 12.6 % (ref 11.5–15.5)
WBC: 17.9 10*3/uL — ABNORMAL HIGH (ref 4.0–10.5)
nRBC: 0 % (ref 0.0–0.2)

## 2022-08-13 LAB — PROTIME-INR
INR: 1.1 (ref 0.8–1.2)
Prothrombin Time: 13.6 seconds (ref 11.4–15.2)

## 2022-08-13 LAB — APTT: aPTT: 29 seconds (ref 24–36)

## 2022-08-13 LAB — ETHANOL: Alcohol, Ethyl (B): <10 mg/dL (ref <10)

## 2022-08-13 MED ORDER — SODIUM CHLORIDE 0.9 % IV BOLUS
500.0000 mL | Freq: Once | INTRAVENOUS | Status: AC
  Administered 2022-08-13: 500 mL via INTRAVENOUS

## 2022-08-13 MED ORDER — FINASTERIDE 5 MG PO TABS
5.0000 mg | ORAL_TABLET | Freq: Every day | ORAL | Status: DC
  Administered 2022-08-14: 5 mg via ORAL
  Filled 2022-08-13: qty 1

## 2022-08-13 MED ORDER — PANTOPRAZOLE SODIUM 40 MG IV SOLR
40.0000 mg | Freq: Once | INTRAVENOUS | Status: AC
  Administered 2022-08-13: 40 mg via INTRAVENOUS
  Filled 2022-08-13: qty 10

## 2022-08-13 MED ORDER — ONDANSETRON HCL 4 MG/2ML IJ SOLN
4.0000 mg | Freq: Four times a day (QID) | INTRAMUSCULAR | Status: DC | PRN
  Administered 2022-08-14 – 2022-08-15 (×3): 4 mg via INTRAVENOUS
  Filled 2022-08-13 (×3): qty 2

## 2022-08-13 MED ORDER — PANTOPRAZOLE SODIUM 40 MG IV SOLR
40.0000 mg | Freq: Two times a day (BID) | INTRAVENOUS | Status: DC
  Administered 2022-08-14 (×2): 40 mg via INTRAVENOUS
  Filled 2022-08-13 (×3): qty 10

## 2022-08-13 MED ORDER — LACTATED RINGERS IV SOLN: INTRAVENOUS | Status: AC

## 2022-08-13 NOTE — ED Provider Notes (Signed)
[facility] DEPT Provider Note   CSN: [id number] Arrival date & time: 07/16/2022  4270     History  Chief Complaint  Patient presents with   Emesis   Urinary Retention    AUREN VALDES is a 88 y.o. male.  Patient with history of hypertension, hyperlipidemia, alcohol abuse, COPD presents today with complaints of hematemesis and urinary retention. Patient states that this morning he had 4 episodes of coffee ground emesis with the last episode being around noon today. States that he drinks alcohol daily and has done so for many years. Also states that he recently fell on 12/21 and was diagnosed with a right sided rib fracture and has been taking large amounts of tylenol and ibuprofen since for pain. He also states that he has been urinating less frequently over the last few days and is concerned that he is actually unable to urinate. He denies any abdominal pain. States that he last voided at 3 pm today. Also states that he has had diminished oral intake over the past few days. He denies any fevers, chills, shortness of breath, abdominal pain, or diarrhea. States that his last bowel movement was around 3 pm today as well as was not bloody or melanous. Denies any hematuria or dysuria. Denies any history of similar events previously.   The history is provided by the patient. No language interpreter was used.  Emesis      Home Medications Prior to Admission medications   Medication Sig Start Date End Date Taking? Authorizing Provider  acetaminophen (TYLENOL) 500 MG tablet Take 500 mg by mouth every 6 (six) hours as needed.    [provider]  albuterol (PROAIR HFA) 108 (90 Base) MCG/ACT inhaler Inhale 1-2 puffs into the lungs every 4 (four) hours as needed. as needed shortness of breath prior to exercise 05/09/21   Baird Lyons D, MD  amLODipine (NORVASC) 5 MG tablet TAKE 1 TABLET (5 MG TOTAL) BY MOUTH DAILY. 08/04/22   [name], MD   atorvastatin (LIPITOR) 20 MG tablet TAKE 1 TABLET BY MOUTH EVERY DAY 01/09/22   [name], MD  benazepril-hydrochlorthiazide (LOTENSIN HCT) 20-25 MG tablet     [provider]  calcipotriene (DOVONOX) 0.005 % ointment     [provider]  finasteride (PROSCAR) 5 MG tablet Take 5 mg by mouth daily. 07/18/18   [provider]  hydrochlorothiazide (HYDRODIURIL) 25 MG tablet TAKE 1 TABLET BY MOUTH EVERY DAY IN THE MORNING 04/30/22   [name], MD  ipratropium (ATROVENT) 0.03 % nasal spray 1-2 sprays each nostril before meals as needed 05/09/21   Baird Lyons D, MD  meloxicam (MOBIC) 15 MG tablet Take 1 tablet (15 mg total) by mouth daily. 08/02/22   [name], MD  tamsulosin (FLOMAX) 0.4 MG CAPS capsule 1 capsule 30 minutes after the same meal each day    [provider]      Allergies    Antihistamines, diphenhydramine-type    Review of Systems   Review of Systems  Gastrointestinal:  Positive for nausea and vomiting.  Genitourinary:  Positive for decreased urine volume.  All other systems reviewed and are negative.   Physical Exam Updated Vital Signs Ht '5\' 5"'$  (1.651 m)   Wt 54 kg   SpO2 98%   BMI 19.81 kg/m  Physical Exam Vitals and nursing note reviewed.  Constitutional:      General: He is not in acute distress.    Appearance:  Normal appearance. He is normal weight. He is not ill-appearing, toxic-appearing or diaphoretic.  HENT:     Head: Normocephalic and atraumatic.  Cardiovascular:     Rate and Rhythm: Normal rate and regular rhythm.     Heart sounds: Normal heart sounds.  Pulmonary:     Effort: Pulmonary effort is normal. No respiratory distress.  Abdominal:     General: Abdomen is flat.     Palpations: Abdomen is soft.     Tenderness: There is no abdominal tenderness.     Comments: Patient with bilateral inguinal hernias both soft and not painful to touch, easily reducible and without overlying skin changes.    Musculoskeletal:        General: Normal range of motion.     Cervical back: Normal range of motion.  Skin:    General: Skin is warm and dry.  Neurological:     General: No focal deficit present.     Mental Status: He is alert.  Psychiatric:        Mood and Affect: Mood normal.        Behavior: Behavior normal.     ED Results / Procedures / Treatments   Labs (all labs ordered are listed, but only abnormal results are displayed) Labs Reviewed  CBC - Abnormal; Notable for the following components:      Result Value   WBC 17.9 (*)    RBC 4.04 (*)    Platelets 445 (*)    All other components within normal limits  COMPREHENSIVE METABOLIC PANEL - Abnormal; Notable for the following components:   Chloride 94 (*)    CO2 33 (*)    Glucose, Bld 136 (*)    BUN 32 (*)    Creatinine, Ser 1.30 (*)    Calcium 11.0 (*)    GFR, Estimated 53 (*)    All other components within normal limits  PROTIME-INR  APTT  ETHANOL  URINALYSIS, ROUTINE W REFLEX MICROSCOPIC    EKG None  Radiology DG Chest 2 View  Result Date: 08/07/2022 CLINICAL DATA:  Hematemesis EXAM: CHEST - 2 VIEW COMPARISON:  08/03/2022 FINDINGS: Overpenetrated exam. Normal heart size. Aortic atherosclerosis. Severe emphysematous lung changes. No focal airspace consolidation, pleural effusion, or pneumothorax. IMPRESSION: Severe emphysematous lung changes. No acute cardiopulmonary findings. Electronically Signed   By: Davina Poke D.O.   On: 08/02/2022 17:49    Procedures Procedures    Medications Ordered in ED Medications  pantoprazole (PROTONIX) injection 40 mg (40 mg Intravenous Given 08/03/2022 1735)  sodium chloride 0.9 % bolus 500 mL (500 mLs Intravenous New Bag/Given 08/01/2022 1945)    ED Course/ Medical Decision Making/ A&P                           Medical Decision Making Amount and/or Complexity of Data Reviewed Labs: ordered. Radiology: ordered. ECG/medicine tests: ordered.  Risk Prescription drug  management.   This patient is a 88 y.o. male who presents to the ED for concern of hematemesis and urinary retention, this involves an extensive number of treatment options, and is a complaint that carries with it a high risk of complications and morbidity. The emergent differential diagnosis prior to evaluation includes, but is not limited to,  upper GI bleed .   This is not an exhaustive differential.   Past Medical History / Co-morbidities / Social History: Hx alcohol abuse  Additional history: Chart reviewed. Pertinent results include: patient with recent right anterior  rib fracture on 12/21  Physical Exam: Physical exam performed. The pertinent findings include: bilateral hernias present, abdomen soft and nontender  Lab Tests: I ordered, and personally interpreted labs.  The pertinent results include:  WBC 17.9, chloride 94, BUN 32, creatinine 1.30 up from 20 and 0.85 1 month ago. UA pending at admission   Imaging Studies: I ordered imaging studies including CXR. I independently visualized and interpreted imaging which showed   Severe emphysematous lung changes. No acute cardiopulmonary findings.  I agree with the radiologist interpretation.  Medications: I ordered medication including protonix and fluids  for concern for upper GI bleed and dehydration. Reevaluation of the patient after these medicines showed that the patient improved. I have reviewed the patients home medicines and have made adjustments as needed.  Consultations Obtained: I requested consultation with the gastroenterology on call Dr. Paulita Fujita,  and discussed lab and imaging findings as well as pertinent plan - they recommend: they will see the patient in consult if admission is indicated. Non-emergent endoscopy is not available until Tuesday   Disposition:  Patient presents today with concern for coffee ground emesis and decreased urination. He is afebrile, non-toxic appearing, and in no acute distress with  reassuring vital signs. Patient has been monitored here for several hours and has not had any episodes of coffee ground emesis. He has been given protonix. He is pain free. Considered discharge with PPI and close outpatient follow-up, however given patient's age and comorbid conditions and risk factors of upper GI bleed including chronic alcohol use and recent increase in NSAID use as well as his AKI and clinical dehydration, feel patient would benefit from being observed overnight, trending hemoglobin and hydration and seeing GI tomorrow. I discussed this with the patient and family who would prefer to seek admission as well. They are understanding and amenable with plan.  Discussed patient with hospitalist Dr. Flossie Buffy who agrees to admit  This is a shared visit with supervising physician Dr. Armandina Gemma who has independently evaluated patient & provided guidance in evaluation/management/disposition, in agreement with care    Final Clinical Impression(s) / ED Diagnoses Final diagnoses:  AKI (acute kidney injury) ([place])  Coffee ground emesis    Rx / DC Orders ED Discharge Orders     None         Nestor Lewandowsky 07/18/2022 2145    Regan Lemming, MD 07/25/2022 2211

## 2022-08-13 NOTE — Assessment & Plan Note (Addendum)
Ca of 11 on admission, felt secondary to dehydration. -Patient hydrated with IV fluids with improvement with hypercalcemia.

## 2022-08-13 NOTE — Assessment & Plan Note (Addendum)
-  Patient noted to go into respiratory distress the morning of Sep 10, 2022, ABG done noted a pH of 7.1, pCO2 of 92, pO2 of 63.   -Patient placed on scheduled Xopenex, Atrovent nebs, Pulmicort IV Solu-Medrol given.  PCCM consulted who assessed patient and discussed with family and decision was made to transition to full comfort measures.   -Patient kept comfortable and patient subsequently died at 0945 hrs. on 09/10/2022.

## 2022-08-13 NOTE — Assessment & Plan Note (Addendum)
-  creatinine of 1.30 up from 0.85.  Likely secondary to prerenal azotemia in the setting of possible urinary retention.   -Patient hydrated with IV fluids, Foley catheter placed with improvement with renal function.   -Patient's condition deteriorated, PCCM discussed with family and decision made to transition to full comfort measures.  Patient subsequently died at 65 hrs. on 09-01-2022.

## 2022-08-13 NOTE — Telephone Encounter (Addendum)
Patient's wife called me around 3 pm saying he had some dark emesis and was having trouble voiding.A couple of issues come to mind- has been taking Meloxicam recently for musculoskeletal pain since last ED visit and urinary retention/possible UTI with hx of BPH. I felt he needed evaluation in ED as office is closed today and he is frail. He could have significant UTI and or GI bleeding. Wife is unable to get him up and transport him by herself. That is why ambulance was called. [place] are in the process of moving to [place] but likely will not move until [place].  He has hx of BPH treated with Proscar and Flomax. Has mild HTN treated with Norvasc and Lotensin. Hx smoking and has COPD but not oxygen dependent. Chronic musculoskeletal pain.  Fell at home in October and had scalp laceration. Has not quite gotten back to baseline after that fall. Seems more frail.  Patient in wife reside alone near [place]. They have one son who lives in [place].  Please call me if I can be of help. MJB,MD

## 2022-08-13 NOTE — Assessment & Plan Note (Addendum)
-  Initially felt reactive secondary to GI bleed in the setting of alcohol withdrawal.  -Acute hospitalization patient noted to be hypoxic requiring Venturi max, chest x-ray done with concern for possible aspiration pneumonia.   -Urine strep pneumococcus antigen done was negative.  Urine Legionella antigen done pending.   -Patient placed empirically on IV Unasyn and IV azithromycin due to concerns for aspiration pneumonia.   -Patient's condition deteriorated and worsened, patient went into acute hypoxic respiratory failure, seen in consultation by PCCM who discussed with family and decision made to transition to full comfort measures.

## 2022-08-13 NOTE — H&P (Signed)
History and Physical    Patient: Jeffrey Meadows XKG:[id number] DOB: 1932-12-14 DOA: 07/31/2022 DOS: the patient was seen and examined on 08/12/2022 PCP: [name], MD  Patient coming from: Home  Chief Complaint:  Chief Complaint  Patient presents with   Emesis   Urinary Retention   HPI: Jeffrey Meadows is a 88 y.o. male with medical history significant of HTN, COPD, PVD, BPH, HLD, alcohol use who presents with hematemesis and  coffee ground emesis.   Patient had a fall on 08/03/2022 and was found to have right 7th rib fracture. Since then has been taking meloxicam daily. Prior to that has been on Naproxen for years. Also drinks several glasses of wine with Brandy nightly. Today had total of 4 episodes of vomiting with the first one being bright red and then progressively was more coffee-ground emesis. Had bowel movement this morning that was normal. No abdominal pain. Just pain around right rib fracture site. Also reports no urine output since this morning. Has hx of BPH. No back pain. An in and out cath was performed in ED with minimal output.   Lab work revealing for AKI with creatinine of 1.30 up from 0.85. Calcium of 11.  WBC of 17.9, hgb of 13.4.   ETOH <10. CXR is negative.  ED PA discussed with GI Dr. Paulita Fujita who will see in consultation in the morning. Administered PPI. Hospitalist then consulted for admission.   Review of Systems: As mentioned in the history of present illness. All other systems reviewed and are negative. Past Medical History:  Diagnosis Date   Adenomatous polyp of colon 08/2001   removed hepatic flexure   Arthritis    Cataract    Chronic back pain    stenosis/scoliosis/synovial cyst   Enlarged prostate    takes Proscar daily   History of blood transfusion    no abnormal reaction noted   Hyperlipidemia    takes Atorvastatin daily   Hypertension    takes Amlodipine and HCTZ daily   Joint pain    On home oxygen therapy    at night   Raynaud  disease    Urinary frequency    takes Flomax daily   Villous adenoma of colon 11/.2001   2 cm removed cecum   Past Surgical History:  Procedure Laterality Date   ABDOMINAL AORTOGRAM N/A 08/15/2017   Procedure: ABDOMINAL AORTOGRAM;  Surgeon: Waynetta Sandy, MD;  Location: [place] [facility];  Service: Cardiovascular;  Laterality: N/A;   cataract surgery     CYSTOSCOPY WITH LITHOLAPAXY N/A 01/11/2016   Procedure: CYSTOSCOPY WITH LITHOLAPAXY;  Surgeon: Festus Aloe, MD;  Location: [facility];  Service: Urology;  Laterality: N/A;   GREEN LIGHT LASER TURP (TRANSURETHRAL RESECTION OF PROSTATE N/A 01/11/2016   Procedure: GREEN LIGHT LASER TURP (TRANSURETHRAL RESECTION OF PROSTATE;  Surgeon: Festus Aloe, MD;  Location: [facility];  Service: Urology;  Laterality: N/A;   HERNIA REPAIR Left    inguinal   HOLMIUM LASER APPLICATION N/A 02/12/6377   Procedure: HOLMIUM LASER APPLICATION;  Surgeon: Festus Aloe, MD;  Location: [facility];  Service: Urology;  Laterality: N/A;   JOINT REPLACEMENT     Right Hip   LAMINECTOMY Right 07/02/2014   Procedure: Right Lumbar two-three Laminectomy for Synovial Cyst;  Surgeon: Erline Levine, MD;  Location: [place] [facility];  Service: Neurosurgery;  Laterality: Right;   LOWER EXTREMITY ANGIOGRAPHY Bilateral 08/15/2017   Procedure: LOWER EXTREMITY ANGIOGRAPHY;  Surgeon: Waynetta Sandy, MD;  Location: [facility]  IN[facility];  Service: Cardiovascular;  Laterality: Bilateral;   POPLITEAL VENOUS ANEURYSM REPAIR Left 2010   about 6 yrs ago   PROSTATE BIOPSY  4/95   SPINE SURGERY  Jan. 2016   Dr. Vickie Epley   TONSILLECTOMY     TOTAL HIP ARTHROPLASTY  1952   right   Social History:  reports that he quit smoking about 33 years ago. His smoking use included cigarettes. He has a 60.00 pack-year smoking history. He has never been exposed to tobacco smoke. He has never used smokeless tobacco. He reports current alcohol use. He reports that he does not use  drugs.  Allergies  Allergen Reactions   Antihistamines, Diphenhydramine-Type Other (See Comments)    Enlarged prostate    Family History  Problem Relation Age of Onset   Heart failure Mother    Heart disease Mother    Aneurysm Father     Prior to Admission medications   Medication Sig Start Date End Date Taking? Authorizing Provider  acetaminophen (TYLENOL) 500 MG tablet Take 500 mg by mouth every 6 (six) hours as needed.    [provider]  albuterol (PROAIR HFA) 108 (90 Base) MCG/ACT inhaler Inhale 1-2 puffs into the lungs every 4 (four) hours as needed. as needed shortness of breath prior to exercise 05/09/21   Baird Lyons D, MD  amLODipine (NORVASC) 5 MG tablet TAKE 1 TABLET (5 MG TOTAL) BY MOUTH DAILY. 08/04/22   [name], MD  atorvastatin (LIPITOR) 20 MG tablet TAKE 1 TABLET BY MOUTH EVERY DAY 01/09/22   [name], MD  benazepril-hydrochlorthiazide (LOTENSIN HCT) 20-25 MG tablet     [provider]  calcipotriene (DOVONOX) 0.005 % ointment     [provider]  finasteride (PROSCAR) 5 MG tablet Take 5 mg by mouth daily. 07/18/18   [provider]  hydrochlorothiazide (HYDRODIURIL) 25 MG tablet TAKE 1 TABLET BY MOUTH EVERY DAY IN THE MORNING 04/30/22   [name], MD  ipratropium (ATROVENT) 0.03 % nasal spray 1-2 sprays each nostril before meals as needed 05/09/21   Baird Lyons D, MD  meloxicam (MOBIC) 15 MG tablet Take 1 tablet (15 mg total) by mouth daily. 08/02/22   [name], MD  tamsulosin (FLOMAX) 0.4 MG CAPS capsule 1 capsule 30 minutes after the same meal each day    [provider]    Physical Exam: Vitals:   08/12/2022 2215 07/21/2022 2230 08/03/2022 2245 07/26/2022 2300  BP:  133/78  (!) 149/88  Pulse: 83 80 89 92  Resp:    18  Temp:      TempSrc:      SpO2: 99% 97% 96% 94%  Weight:      Height:       Constitutional: NAD, calm, comfortable, non toxic appearing elderly male laying flat in bed Eyes: lids  and conjunctivae normal ENMT: Mucous membranes are moist.  Neck: normal, supple Respiratory: clear to auscultation bilaterally, no wheezing, no crackles. Normal respiratory effort. No accessory muscle use.  Cardiovascular: Regular rate and rhythm, no murmurs / rubs / gallops.  Abdomen: soft, non-distended, non-tender, Bowel sounds positive.  Musculoskeletal: no clubbing / cyanosis. No joint deformity upper and lower extremities. Good ROM, no contractures. Normal muscle tone.  GU: condom foley in place Skin: no rashes, lesions, ulcers.  Neurologic: CN 2-12 grossly intact.  Strength 5/5 in all 4.  Psychiatric: Normal judgment and insight. Alert and oriented x 3. Normal mood. Data Reviewed:  See  HPI   Assessment and Plan: * Acute GI bleeding -upper GI bleed likely ulcer from NSAIDS and heavy alcohol use. Also on ACE and HCTZ combination. -Hgb stable at 13.4 -continue PPI q12 hr -GI Dr. Paulita Fujita consulted and will see in the morning  Alcohol use -pt reports at least 3 glass of wine with Brandy nightly. Last drink last evening. No clinical signs of withdrawal. Will continue to monitor.   Thrombocytosis -likely reactive. Continue to monitor with repeat lab work overnight.  Leukocytosis -reactive   Hypercalcemia Ca of 11. Give IV fluid overnight and follow repeat.  AKI (acute kidney injury) ([place]) -creatinine of 1.30 up from 0.85. More like pre-renal with urinary retention and minimal UOP with in and out cath.  -continuous IV fluid overnight and follow. If still no output overnight, would place foley catheter and get renal ultrasound  BPH with obstruction/lower urinary tract symptoms -currently have urinary retention but more like decrease output from dehydration  COPD with chronic bronchitis and emphysema (HCC) -stable. Not in exacerbation      Advance Care Planning:   Code Status: Full Code   Consults: GI Dr. Paulita Fujita  Family Communication: wife at bedside  Severity of  Illness: The appropriate patient status for this patient is OBSERVATION. Observation status is judged to be reasonable and necessary in order to provide the required intensity of service to ensure the patient's safety. The patient's presenting symptoms, physical exam findings, and initial radiographic and laboratory data in the context of their medical condition is felt to place them at decreased risk for further clinical deterioration. Furthermore, it is anticipated that the patient will be medically stable for discharge from the hospital within 2 midnights of admission.   Author: Orene Desanctis, DO 07/26/2022 11:57 PM  For on call review [email or web address].

## 2022-08-13 NOTE — ED Triage Notes (Signed)
Patient had two episodes of coffee brown emesis this morning, states he also is having urinary retention.

## 2022-08-13 NOTE — Assessment & Plan Note (Addendum)
-  Patient had presented with hematemesis, history of alcohol use and NSAID use. -Patient seen in consultation by GI underwent upper endoscopy that showed multiple duodenal ulcers with large oozing duodenal ulcer and multiple duodenal ulcers placed on the Protonix drip. -Patient with no further hematemesis. -Hemoglobin stabilized at 10.6 -Patient kept n.p.o. due to lethargy. -Patient's condition deteriorated, was seen by PCCM, patient noted to go into acute hypoxic respiratory failure, metabolic encephalopathy, PCCM discussed with family decision was made to transition to full comfort measures. -Patient was kept comfortable and subsequently died at 0945 hrs. on 08/23/22.

## 2022-08-13 NOTE — Assessment & Plan Note (Addendum)
-  pt reports at least 3 glass of wine with Brandy nightly. Last drink the night prior to admission.   -Patient placed on Ativan withdrawal protocol, patient was noted to go into alcohol withdrawal during the hospitalization and sedated with Ativan.  Patient maintained on folic acid, multivitamin, thiamine.   -Patient's condition deteriorated, patient went into acute hypoxic respiratory failure, seen in consultation by PCCM, who discussed with family and decision made to transition to full comfort measures.

## 2022-08-13 NOTE — ED Triage Notes (Signed)
EMS had two episodes of coffee ground emesis this morning, with urinary retention. When speaking with the pt. He states that he has not had any episodes on emesis or any urinary retention.

## 2022-08-13 NOTE — Assessment & Plan Note (Addendum)
-  Foley catheter was placed during the hospitalization and patient placed on Flomax.

## 2022-08-13 NOTE — Assessment & Plan Note (Addendum)
   likely reactive °

## 2022-08-14 DIAGNOSIS — K922 Gastrointestinal hemorrhage, unspecified: Secondary | ICD-10-CM | POA: Diagnosis not present

## 2022-08-14 DIAGNOSIS — K920 Hematemesis: Secondary | ICD-10-CM

## 2022-08-14 DIAGNOSIS — Z789 Other specified health status: Secondary | ICD-10-CM | POA: Diagnosis not present

## 2022-08-14 DIAGNOSIS — N179 Acute kidney failure, unspecified: Secondary | ICD-10-CM | POA: Diagnosis not present

## 2022-08-14 LAB — CBC
HCT: 40.4 % (ref 39.0–52.0)
Hemoglobin: 13.3 g/dL (ref 13.0–17.0)
MCH: 32.9 pg (ref 26.0–34.0)
MCHC: 32.9 g/dL (ref 30.0–36.0)
MCV: 100 fL (ref 80.0–100.0)
Platelets: 427 10*3/uL — ABNORMAL HIGH (ref 150–400)
RBC: 4.04 MIL/uL — ABNORMAL LOW (ref 4.22–5.81)
RDW: 12.7 % (ref 11.5–15.5)
WBC: 17.4 10*3/uL — ABNORMAL HIGH (ref 4.0–10.5)
nRBC: 0 % (ref 0.0–0.2)

## 2022-08-14 LAB — BASIC METABOLIC PANEL
Anion gap: 9 (ref 5–15)
BUN: 35 mg/dL — ABNORMAL HIGH (ref 8–23)
CO2: 31 mmol/L (ref 22–32)
Calcium: 10.2 mg/dL (ref 8.9–10.3)
Chloride: 97 mmol/L — ABNORMAL LOW (ref 98–111)
Creatinine, Ser: 1.12 mg/dL (ref 0.61–1.24)
GFR, Estimated: >60 mL/min (ref >60)
Glucose, Bld: 102 mg/dL — ABNORMAL HIGH (ref 70–99)
Potassium: 3.8 mmol/L (ref 3.5–5.1)
Sodium: 137 mmol/L (ref 135–145)

## 2022-08-14 LAB — MAGNESIUM: Magnesium: 2 mg/dL (ref 1.7–2.4)

## 2022-08-14 MED ORDER — LORAZEPAM 2 MG/ML IJ SOLN
1.0000 mg | INTRAMUSCULAR | Status: DC | PRN
  Administered 2022-08-15: 2 mg via INTRAVENOUS
  Administered 2022-08-15: 1 mg via INTRAVENOUS
  Administered 2022-08-16 (×2): 2 mg via INTRAVENOUS
  Administered 2022-08-16: 4 mg via INTRAVENOUS
  Filled 2022-08-14: qty 2
  Filled 2022-08-14 (×4): qty 1

## 2022-08-14 MED ORDER — FOLIC ACID 1 MG PO TABS
1.0000 mg | ORAL_TABLET | Freq: Every day | ORAL | Status: DC
  Administered 2022-08-14: 1 mg via ORAL
  Filled 2022-08-14: qty 1

## 2022-08-14 MED ORDER — THIAMINE MONONITRATE 100 MG PO TABS
100.0000 mg | ORAL_TABLET | Freq: Every day | ORAL | Status: DC
  Administered 2022-08-14: 100 mg via ORAL
  Filled 2022-08-14: qty 1

## 2022-08-14 MED ORDER — THIAMINE HCL 100 MG/ML IJ SOLN
100.0000 mg | Freq: Every day | INTRAMUSCULAR | Status: DC
  Administered 2022-08-16: 100 mg via INTRAVENOUS
  Filled 2022-08-14: qty 2

## 2022-08-14 MED ORDER — LORAZEPAM 1 MG PO TABS: 1.0000 mg | ORAL_TABLET | ORAL | Status: DC | PRN

## 2022-08-14 MED ORDER — SODIUM CHLORIDE 0.9 % IV BOLUS: 500.0000 mL | Freq: Once | INTRAVENOUS | Status: DC

## 2022-08-14 MED ORDER — ALUM & MAG HYDROXIDE-SIMETH 200-200-20 MG/5ML PO SUSP
30.0000 mL | ORAL | Status: DC | PRN
  Administered 2022-08-14 – 2022-08-15 (×2): 30 mL via ORAL
  Filled 2022-08-14 (×2): qty 30

## 2022-08-14 MED ORDER — MAGNESIUM SULFATE 2 GM/50ML IV SOLN
2.0000 g | Freq: Once | INTRAVENOUS | Status: AC
  Administered 2022-08-14: 2 g via INTRAVENOUS
  Filled 2022-08-14: qty 50

## 2022-08-14 MED ORDER — ADULT MULTIVITAMIN W/MINERALS CH
1.0000 | ORAL_TABLET | Freq: Every day | ORAL | Status: DC
  Administered 2022-08-14: 1 via ORAL
  Filled 2022-08-14: qty 1

## 2022-08-14 MED ORDER — SODIUM CHLORIDE 0.9 % IV BOLUS
1000.0000 mL | Freq: Once | INTRAVENOUS | Status: AC
  Administered 2022-08-14: 1000 mL via INTRAVENOUS

## 2022-08-14 NOTE — Consult Note (Signed)
[facility] Consultation Note  Referring Provider: [facility] Primary Care Physician:  [name], MD  Reason for Consultation:  hematemesis  HPI: Jeffrey Meadows is a 89 y.o. 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  No melena or hematochezia.  Recent rib fracture, for which he has been taking NSAIDs.  Denies prior bleeding.  Personal history of colon polyps, multiple colonoscopies with Dr. Oletta Lamas, last 2011 (no polyps seen).  No prior endoscopy.  No blood thinners.   Past Medical History:  Diagnosis Date   Adenomatous polyp of colon 08/2001   removed hepatic flexure   Arthritis    Cataract    Chronic back pain    stenosis/scoliosis/synovial cyst   Enlarged prostate    takes Proscar daily   History of blood transfusion    no abnormal reaction noted   Hyperlipidemia    takes Atorvastatin daily   Hypertension    takes Amlodipine and HCTZ daily   Joint pain    On home oxygen therapy    at night   Raynaud disease    Urinary frequency    takes Flomax daily   Villous adenoma of colon 11/.2001   2 cm removed cecum    Past Surgical History:  Procedure Laterality Date   ABDOMINAL AORTOGRAM N/A 08/15/2017   Procedure: ABDOMINAL AORTOGRAM;  Surgeon: Waynetta Sandy, MD;  Location: [place] [facility];  Service: Cardiovascular;  Laterality: N/A;   cataract surgery     CYSTOSCOPY WITH LITHOLAPAXY N/A 01/11/2016   Procedure: CYSTOSCOPY WITH LITHOLAPAXY;  Surgeon: Festus Aloe, MD;  [facility]: [facility];  Service: Urology;  Laterality: N/A;   GREEN LIGHT LASER TURP (TRANSURETHRAL RESECTION OF PROSTATE N/A 01/11/2016   Procedure: GREEN LIGHT LASER TURP (TRANSURETHRAL RESECTION OF PROSTATE;  Surgeon: Festus Aloe, MD;  Location: [facility];  Service: Urology;  Laterality: N/A;   HERNIA REPAIR Left    inguinal   HOLMIUM  LASER APPLICATION N/A 3/82/5053   Procedure: HOLMIUM LASER APPLICATION;  Surgeon: Festus Aloe, MD;  Location: [facility];  Service: Urology;  Laterality: N/A;   JOINT REPLACEMENT     Right Hip   LAMINECTOMY Right 07/02/2014   Procedure: Right Lumbar two-three Laminectomy for Synovial Cyst;  Surgeon: Erline Levine, MD;  Location: [place] [facility];  Service: Neurosurgery;  Laterality: Right;   LOWER EXTREMITY ANGIOGRAPHY Bilateral 08/15/2017   Procedure: LOWER EXTREMITY ANGIOGRAPHY;  Surgeon: Waynetta Sandy, MD;  Location: [place] [facility];  Service: Cardiovascular;  Laterality: Bilateral;   POPLITEAL VENOUS ANEURYSM REPAIR Left 2010   about 6 yrs ago   PROSTATE BIOPSY  4/95   SPINE SURGERY  Jan. 2016   Dr. Vickie Epley   TONSILLECTOMY     TOTAL HIP ARTHROPLASTY  1952   right    Prior to Admission medications   Medication Sig Start Date End Date Taking? Authorizing Provider  acetaminophen (TYLENOL) 500 MG tablet Take 500 mg by mouth every 6 (six) hours as needed.    [provider]  albuterol (PROAIR HFA) 108 (90 Base) MCG/ACT inhaler Inhale 1-2 puffs into the lungs every 4 (four) hours as needed. as needed shortness of breath prior to exercise 05/09/21   Baird Lyons D, MD  amLODipine (NORVASC) 5 MG tablet TAKE 1 TABLET (5 MG TOTAL) BY MOUTH DAILY. 08/04/22   [name], MD  atorvastatin (LIPITOR) 20 MG tablet TAKE 1  TABLET BY MOUTH EVERY DAY 01/09/22   [name], MD  benazepril-hydrochlorthiazide (LOTENSIN HCT) 20-25 MG tablet     [provider]  calcipotriene (DOVONOX) 0.005 % ointment     [provider]  finasteride (PROSCAR) 5 MG tablet Take 5 mg by mouth daily. 07/18/18   [provider]  hydrochlorothiazide (HYDRODIURIL) 25 MG tablet TAKE 1 TABLET BY MOUTH EVERY DAY IN THE MORNING 04/30/22   [name], MD  ipratropium (ATROVENT) 0.03 % nasal spray 1-2 sprays each nostril before meals as needed 05/09/21   Baird Lyons D, MD   meloxicam (MOBIC) 15 MG tablet Take 1 tablet (15 mg total) by mouth daily. 08/02/22   [name], MD  tamsulosin (FLOMAX) 0.4 MG CAPS capsule 1 capsule 30 minutes after the same meal each day    [provider]    Current Facility-Administered Medications  Medication Dose Route Frequency Provider Last Rate Last Admin   finasteride (PROSCAR) tablet 5 mg  5 mg Oral Daily Tu, Ching T, DO   5 mg at 17/51/02 5852   folic acid (FOLVITE) tablet 1 mg  1 mg Oral Daily Charlynne Cousins, MD   1 mg at 08/14/22 7782   lactated ringers infusion   Intravenous Continuous Tu, Ching T, DO   Stopped at 08/14/22 0801   LORazepam (ATIVAN) tablet 1-4 mg  1-4 mg Oral Q1H PRN Charlynne Cousins, MD       Or   LORazepam (ATIVAN) injection 1-4 mg  1-4 mg Intravenous Q1H PRN Charlynne Cousins, MD       multivitamin with minerals tablet 1 tablet  1 tablet Oral Daily Charlynne Cousins, MD   1 tablet at 08/14/22 0927   ondansetron [facility]) injection 4 mg  4 mg Intravenous Q6H PRN Tu, Ching T, DO   4 mg at 08/14/22 0004   pantoprazole (PROTONIX) injection 40 mg  40 mg Intravenous Q12H Tu, Ching T, DO   40 mg at 08/14/22 4235   thiamine (VITAMIN B1) tablet 100 mg  100 mg Oral Daily Charlynne Cousins, MD   100 mg at 08/14/22 3614   Or   thiamine (VITAMIN B1) injection 100 mg  100 mg Intravenous Daily Charlynne Cousins, MD       Current Outpatient Medications  Medication Sig Dispense Refill   acetaminophen (TYLENOL) 500 MG tablet Take 500 mg by mouth every 6 (six) hours as needed.     albuterol (PROAIR HFA) 108 (90 Base) MCG/ACT inhaler Inhale 1-2 puffs into the lungs every 4 (four) hours as needed. as needed shortness of breath prior to exercise 54 g 3   amLODipine (NORVASC) 5 MG tablet TAKE 1 TABLET (5 MG TOTAL) BY MOUTH DAILY. 90 tablet 0   atorvastatin (LIPITOR) 20 MG tablet TAKE 1 TABLET BY MOUTH EVERY DAY 90 tablet 3   benazepril-hydrochlorthiazide (LOTENSIN HCT) 20-25 MG tablet       calcipotriene (DOVONOX) 0.005 % ointment      finasteride (PROSCAR) 5 MG tablet Take 5 mg by mouth daily.  3   hydrochlorothiazide (HYDRODIURIL) 25 MG tablet TAKE 1 TABLET BY MOUTH EVERY DAY IN THE MORNING 90 tablet 3   ipratropium (ATROVENT) 0.03 % nasal spray 1-2 sprays each nostril before meals as needed 30 mL 12   meloxicam (MOBIC) 15 MG tablet Take 1 tablet (15 mg total) by mouth daily. 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, 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: [employer]    Employer: RETIRED  Tobacco Use   Smoking status: Former    Packs/day: 1.50    Years: 40.00    Total pack years: 60.00    Types: Cigarettes    Quit date: 02/04/1989    Years since quitting: 33.5    Passive exposure: Never   Smokeless tobacco: Never   Tobacco comments:    quit smoking about 65yr ago  Vaping Use   Vaping Use: Never used  Substance and Sexual Activity   Alcohol use: Yes    Comment: 3-5 glasses of wine daily, 12-20 glasses a week   Drug use: No   Sexual activity: Not Currently  Other Topics Concern   Not on file  Social History Narrative   Lost license DUI   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    Exercise: Pt reports exercising 4 days a week for 60 minutes.   Seatbelts: Pt reports wearing seatbelt when in vehicles.    Sun Exposure/Protection: Pt reports not using sun protection.   Hobbies: reading, writing, music               Social Determinants of Health   Financial Resource Strain: Not on file  Food Insecurity: Not on file  Transportation Needs: Not on file  Physical Activity: Not on file  Stress: Not on file  Social Connections: Not on file  Intimate Partner Violence: Not on file    Review of Systems: As per HPI, all others negative  Physical Exam: Vital signs in last 24 hours: Temp:  [97.8 F (36.6 C)-98 F (36.7 C)] 98 F (36.7 C) (01/01 1030) Pulse Rate:  [71-106] 71 (01/01 1136) Resp:  [12-18] 13 (01/01 1030) BP: (106-161)/(70-93) 161/83 (01/01 1136) SpO2:  [87 %-100 %] 99 % (01/01 1030) Weight:  [54 kg] 54 kg (12/31 1620)   General:   Alert, elderly, thin and somewhat cachectic-appearing Head:  Normocephalic and atraumatic. Eyes:  Sclera clear, no icterus.   Conjunctiva pink. Ears:  Normal auditory acuity. Nose:  No deformity, discharge,  or lesions. Mouth:  No deformity or lesions.  Oropharynx pink & moist. Neck:  Supple; no masses or thyromegaly. Lungs:  No respiratory distress Heart:  Regular rate Abdomen:  Soft, mild epigastric tenderness and nondistended. No masses, hepatosplenomegaly or hernias noted. Normal bowel sounds, without guarding, and without rebound.     Msk: atrophic but symmetrical without gross deformities. Normal posture. Pulses:  Normal pulses noted. Extremities:  Without clubbing or edema. Neurologic:  Alert and  oriented  x4;  grossly normal neurologically. Skin:  Intact without significant lesions or rashes. Psych:  Alert and cooperative. Normal mood and affect.   Lab Results: Recent Labs    07/17/2022 1646 08/14/22 0535  WBC 17.9* 17.4*  HGB 13.4 13.3  HCT 40.2 40.4  PLT 445* 427*   BMET Recent Labs     07/29/2022 1646 08/14/22 0535  NA 139 137  K 4.5 3.8  CL 94* 97*  CO2 33* 31  GLUCOSE 136* 102*  BUN 32* 35*  CREATININE 1.30* 1.12  CALCIUM 11.0* 10.2   LFT Recent Labs    08/03/2022 1646  PROT 7.1  ALBUMIN 3.6  AST 25  ALT 15  ALKPHOS 69  BILITOT 1.0   PT/INR Recent Labs    07/31/2022 1646  LABPROT 13.6  INR 1.1    Studies/Results: DG Chest 2 View  Result Date: 08/07/2022 CLINICAL DATA:  Hematemesis EXAM: CHEST - 2 VIEW COMPARISON:  08/03/2022 FINDINGS: Overpenetrated exam. Normal heart size. Aortic atherosclerosis. Severe emphysematous lung changes. No focal airspace consolidation, pleural effusion, or pneumothorax. IMPRESSION: Severe emphysematous lung changes. No acute cardiopulmonary findings. Electronically Signed   By: Davina Poke D.O.   On: 07/29/2022 17:49    Impression:   Coffee ground emesis. Epigastric abdominal pain. Recent rib fracture with NSAID use. Chronic GERD. Alcohol abuse (> 2 servings beer/wine/liquor per day).  Plan:   PPI. Clear liquid diet ok, NPO after midnight. Watch for alcohol withdrawal. Avoid NSAIDs. Endoscopy tomorrow. Risks (bleeding, infection, bowel perforation that could require surgery, sedation-related changes in cardiopulmonary systems), benefits (identification and possible treatment of source of symptoms, exclusion of certain causes of symptoms), and alternatives (watchful waiting, radiographic imaging studies, empiric medical treatment) of upper endoscopy (EGD) were explained to patient/family in detail and patient wishes to proceed.  Next step in management pending endoscopy findings.   LOS: 1 day   Elvis Boot M  08/14/2022, 1:19 PM  Cell [phone] If no answer or after 5 PM call [phone]

## 2022-08-14 NOTE — H&P (View-Only) (Signed)
[facility] Consultation Note  Referring Provider: [facility] Primary Care Physician:  [name], MD  Reason for Consultation:  hematemesis  HPI: Jeffrey Meadows is a 89 y.o. 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  No melena or hematochezia.  Recent rib fracture, for which he has been taking NSAIDs.  Denies prior bleeding.  Personal history of colon polyps, multiple colonoscopies with Dr. Oletta Lamas, last 2011 (no polyps seen).  No prior endoscopy.  No blood thinners.   Past Medical History:  Diagnosis Date   Adenomatous polyp of colon 08/2001   removed hepatic flexure   Arthritis    Cataract    Chronic back pain    stenosis/scoliosis/synovial cyst   Enlarged prostate    takes Proscar daily   History of blood transfusion    no abnormal reaction noted   Hyperlipidemia    takes Atorvastatin daily   Hypertension    takes Amlodipine and HCTZ daily   Joint pain    On home oxygen therapy    at night   Raynaud disease    Urinary frequency    takes Flomax daily   Villous adenoma of colon 11/.2001   2 cm removed cecum    Past Surgical History:  Procedure Laterality Date   ABDOMINAL AORTOGRAM N/A 08/15/2017   Procedure: ABDOMINAL AORTOGRAM;  Surgeon: Waynetta Sandy, MD;  Location: [facility];  Service: Cardiovascular;  Laterality: N/A;   cataract surgery     CYSTOSCOPY WITH LITHOLAPAXY N/A 01/11/2016   Procedure: CYSTOSCOPY WITH LITHOLAPAXY;  Surgeon: Festus Aloe, MD;  Location: [facility];  Service: Urology;  Laterality: N/A;   GREEN LIGHT LASER TURP (TRANSURETHRAL RESECTION OF PROSTATE N/A 01/11/2016   Procedure: GREEN LIGHT LASER TURP (TRANSURETHRAL RESECTION OF PROSTATE;  Surgeon: Festus Aloe, MD;  Location: [facility];  Service: Urology;  Laterality: N/A;   HERNIA REPAIR Left    inguinal   HOLMIUM  LASER APPLICATION N/A 2/35/3614   Procedure: HOLMIUM LASER APPLICATION;  Surgeon: Festus Aloe, MD;  Location: [facility];  Service: Urology;  Laterality: N/A;   JOINT REPLACEMENT     Right Hip   LAMINECTOMY Right 07/02/2014   Procedure: Right Lumbar two-three Laminectomy for Synovial Cyst;  Surgeon: Erline Levine, MD;  Location: [facility];  Service: Neurosurgery;  Laterality: Right;   LOWER EXTREMITY ANGIOGRAPHY Bilateral 08/15/2017   Procedure: LOWER EXTREMITY ANGIOGRAPHY;  Surgeon: Waynetta Sandy, MD;  Location: [facility];  Service: Cardiovascular;  Laterality: Bilateral;   POPLITEAL VENOUS ANEURYSM REPAIR Left 2010   about 6 yrs ago   PROSTATE BIOPSY  4/95   SPINE SURGERY  Jan. 2016   Dr. Vickie Epley   TONSILLECTOMY     TOTAL HIP ARTHROPLASTY  1952   right    Prior to Admission medications   Medication Sig Start Date End Date Taking? Authorizing Provider  acetaminophen (TYLENOL) 500 MG tablet Take 500 mg by mouth every 6 (six) hours as needed.    [provider]  albuterol (PROAIR HFA) 108 (90 Base) MCG/ACT inhaler Inhale 1-2 puffs into the lungs every 4 (four) hours as needed. as needed shortness of breath prior to exercise 05/09/21   Baird Lyons D, MD  amLODipine (NORVASC) 5 MG tablet TAKE 1 TABLET (5 MG TOTAL) BY MOUTH DAILY. 08/04/22   [name], MD  atorvastatin (LIPITOR) 20 MG tablet TAKE 1  TABLET BY MOUTH EVERY DAY 01/09/22   [name], MD  benazepril-hydrochlorthiazide (LOTENSIN HCT) 20-25 MG tablet     [provider]  calcipotriene (DOVONOX) 0.005 % ointment     [provider]  finasteride (PROSCAR) 5 MG tablet Take 5 mg by mouth daily. 07/18/18   [provider]  hydrochlorothiazide (HYDRODIURIL) 25 MG tablet TAKE 1 TABLET BY MOUTH EVERY DAY IN THE MORNING 04/30/22   [name], MD  ipratropium (ATROVENT) 0.03 % nasal spray 1-2 sprays each nostril before meals as needed 05/09/21   Baird Lyons D, MD   meloxicam (MOBIC) 15 MG tablet Take 1 tablet (15 mg total) by mouth daily. 08/02/22   [name], MD  tamsulosin (FLOMAX) 0.4 MG CAPS capsule 1 capsule 30 minutes after the same meal each day    [provider]    Current Facility-Administered Medications  Medication Dose Route Frequency Provider Last Rate Last Admin   finasteride (PROSCAR) tablet 5 mg  5 mg Oral Daily Tu, Ching T, DO   5 mg at 84/69/62 9528   folic acid (FOLVITE) tablet 1 mg  1 mg Oral Daily Charlynne Cousins, MD   1 mg at 08/14/22 4132   lactated ringers infusion   Intravenous Continuous Tu, Ching T, DO   Stopped at 08/14/22 0801   LORazepam (ATIVAN) tablet 1-4 mg  1-4 mg Oral Q1H PRN Charlynne Cousins, MD       Or   LORazepam (ATIVAN) injection 1-4 mg  1-4 mg Intravenous Q1H PRN Charlynne Cousins, MD       multivitamin with minerals tablet 1 tablet  1 tablet Oral Daily Charlynne Cousins, MD   1 tablet at 08/14/22 0927   ondansetron [facility]) injection 4 mg  4 mg Intravenous Q6H PRN Tu, Ching T, DO   4 mg at 08/14/22 0004   pantoprazole (PROTONIX) injection 40 mg  40 mg Intravenous Q12H Tu, Ching T, DO   40 mg at 08/14/22 4401   thiamine (VITAMIN B1) tablet 100 mg  100 mg Oral Daily Charlynne Cousins, MD   100 mg at 08/14/22 0272   Or   thiamine (VITAMIN B1) injection 100 mg  100 mg Intravenous Daily Charlynne Cousins, MD       Current Outpatient Medications  Medication Sig Dispense Refill   acetaminophen (TYLENOL) 500 MG tablet Take 500 mg by mouth every 6 (six) hours as needed.     albuterol (PROAIR HFA) 108 (90 Base) MCG/ACT inhaler Inhale 1-2 puffs into the lungs every 4 (four) hours as needed. as needed shortness of breath prior to exercise 54 g 3   amLODipine (NORVASC) 5 MG tablet TAKE 1 TABLET (5 MG TOTAL) BY MOUTH DAILY. 90 tablet 0   atorvastatin (LIPITOR) 20 MG tablet TAKE 1 TABLET BY MOUTH EVERY DAY 90 tablet 3   benazepril-hydrochlorthiazide (LOTENSIN HCT) 20-25 MG tablet       calcipotriene (DOVONOX) 0.005 % ointment      finasteride (PROSCAR) 5 MG tablet Take 5 mg by mouth daily.  3   hydrochlorothiazide (HYDRODIURIL) 25 MG tablet TAKE 1 TABLET BY MOUTH EVERY DAY IN THE MORNING 90 tablet 3   ipratropium (ATROVENT) 0.03 % nasal spray 1-2 sprays each nostril before meals as needed 30 mL 12   meloxicam (MOBIC) 15 MG tablet Take 1 tablet (15 mg total) by mouth daily. 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, 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: [employer]    Employer: RETIRED  Tobacco Use   Smoking status: Former    Packs/day: 1.50    Years: 40.00    Total pack years: 60.00    Types: Cigarettes    Quit date: 02/04/1989    Years since quitting: 33.5    Passive exposure: Never   Smokeless tobacco: Never   Tobacco comments:    quit smoking about 30yr ago  Vaping Use   Vaping Use: Never used  Substance and Sexual Activity   Alcohol use: Yes    Comment: 3-5 glasses of wine daily, 12-20 glasses a week   Drug use: No   Sexual activity: Not Currently  Other Topics Concern   Not on file  Social History Narrative   Lost license DUI   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    Exercise: Pt reports exercising 4 days a week for 60 minutes.   Seatbelts: Pt reports wearing seatbelt when in vehicles.    Sun Exposure/Protection: Pt reports not using sun protection.   Hobbies: reading, writing, music               Social Determinants of Health   Financial Resource Strain: Not on file  Food Insecurity: Not on file  Transportation Needs: Not on file  Physical Activity: Not on file  Stress: Not on file  Social Connections: Not on file  Intimate Partner Violence: Not on file    Review of Systems: As per HPI, all others negative  Physical Exam: Vital signs in last 24 hours: Temp:  [97.8 F (36.6 C)-98 F (36.7 C)] 98 F (36.7 C) (01/01 1030) Pulse Rate:  [71-106] 71 (01/01 1136) Resp:  [12-18] 13 (01/01 1030) BP: (106-161)/(70-93) 161/83 (01/01 1136) SpO2:  [87 %-100 %] 99 % (01/01 1030) Weight:  [54 kg] 54 kg (12/31 1620)   General:   Alert, elderly, thin and somewhat cachectic-appearing Head:  Normocephalic and atraumatic. Eyes:  Sclera clear, no icterus.   Conjunctiva pink. Ears:  Normal auditory acuity. Nose:  No deformity, discharge,  or lesions. Mouth:  No deformity or lesions.  Oropharynx pink & moist. Neck:  Supple; no masses or thyromegaly. Lungs:  No respiratory distress Heart:  Regular rate Abdomen:  Soft, mild epigastric tenderness and nondistended. No masses, hepatosplenomegaly or hernias noted. Normal bowel sounds, without guarding, and without rebound.     Msk: atrophic but symmetrical without gross deformities. Normal posture. Pulses:  Normal pulses noted. Extremities:  Without clubbing or edema. Neurologic:  Alert and  oriented  x4;  grossly normal neurologically. Skin:  Intact without significant lesions or rashes. Psych:  Alert and cooperative. Normal mood and affect.   Lab Results: Recent Labs    07/25/2022 1646 08/14/22 0535  WBC 17.9* 17.4*  HGB 13.4 13.3  HCT 40.2 40.4  PLT 445* 427*   BMET Recent Labs     08/04/2022 1646 08/14/22 0535  NA 139 137  K 4.5 3.8  CL 94* 97*  CO2 33* 31  GLUCOSE 136* 102*  BUN 32* 35*  CREATININE 1.30* 1.12  CALCIUM 11.0* 10.2   LFT Recent Labs    07/25/2022 1646  PROT 7.1  ALBUMIN 3.6  AST 25  ALT 15  ALKPHOS 69  BILITOT 1.0   PT/INR Recent Labs    07/17/2022 1646  LABPROT 13.6  INR 1.1    Studies/Results: DG Chest 2 View  Result Date: 07/31/2022 CLINICAL DATA:  Hematemesis EXAM: CHEST - 2 VIEW COMPARISON:  08/03/2022 FINDINGS: Overpenetrated exam. Normal heart size. Aortic atherosclerosis. Severe emphysematous lung changes. No focal airspace consolidation, pleural effusion, or pneumothorax. IMPRESSION: Severe emphysematous lung changes. No acute cardiopulmonary findings. Electronically Signed   By: Davina Poke D.O.   On: 07/23/2022 17:49    Impression:   Coffee ground emesis. Epigastric abdominal pain. Recent rib fracture with NSAID use. Chronic GERD. Alcohol abuse (> 2 servings beer/wine/liquor per day).  Plan:   PPI. Clear liquid diet ok, NPO after midnight. Watch for alcohol withdrawal. Avoid NSAIDs. Endoscopy tomorrow. Risks (bleeding, infection, bowel perforation that could require surgery, sedation-related changes in cardiopulmonary systems), benefits (identification and possible treatment of source of symptoms, exclusion of certain causes of symptoms), and alternatives (watchful waiting, radiographic imaging studies, empiric medical treatment) of upper endoscopy (EGD) were explained to patient/family in detail and patient wishes to proceed.  Next step in management pending endoscopy findings.   LOS: 1 day   Daemion Mcniel M  08/14/2022, 1:19 PM  Cell [phone] If no answer or after 5 PM call [phone]

## 2022-08-14 NOTE — Care Management CC44 (Signed)
Condition Code 44 Documentation Completed  Patient Details  Name: Jeffrey Meadows MRN: [id number] Date of Birth: 1933-05-14   Condition Code 44 given:  Yes Patient signature on Condition Code 44 notice:  Yes Documentation of 2 MD's agreement:  Yes Code 44 added to claim:  Yes    Roseanne Kaufman, RN 08/14/2022, 8:32 AM

## 2022-08-14 NOTE — Progress Notes (Signed)
Received TOC consult for [facility] resources, attached to AVS.  No additional TOC needs at this time.

## 2022-08-14 NOTE — Anesthesia Preprocedure Evaluation (Signed)
Anesthesia Evaluation  Patient identified by MRN, date of birth, ID band Patient awake    Reviewed: Allergy & Precautions, NPO status , Patient's Chart, lab work & pertinent test results  Airway Mallampati: II  TM Distance: >3 FB Neck ROM: Full    Dental  (+) Teeth Intact, Dental Advisory Given, Caps,    Pulmonary COPD,  oxygen dependent, former smoker   Pulmonary exam normal breath sounds clear to auscultation       Cardiovascular hypertension, Pt. on medications + Peripheral Vascular Disease  Normal cardiovascular exam Rhythm:Regular Rate:Normal     Neuro/Psych negative neurological ROS     GI/Hepatic Neg liver ROS,,,coffee ground emesis, abdominal pain, nausea, vomiting   Endo/Other  negative endocrine ROS    Renal/GU Renal disease (AKI)     Musculoskeletal negative musculoskeletal ROS (+)    Abdominal   Peds  Hematology negative hematology ROS (+)   Anesthesia Other Findings   Reproductive/Obstetrics                             Anesthesia Physical Anesthesia Plan  ASA: 4  Anesthesia Plan: MAC   Post-op Pain Management: Minimal or no pain anticipated   Induction: Intravenous  PONV Risk Score and Plan: 1 and TIVA and Treatment may vary due to age or medical condition  Airway Management Planned: Natural Airway and Simple Face Mask  Additional Equipment:   Intra-op Plan:   Post-operative Plan:   Informed Consent: I have reviewed the patients History and Physical, chart, labs and discussed the procedure including the risks, benefits and alternatives for the proposed anesthesia with the patient or authorized representative who has indicated his/her understanding and acceptance.     Dental advisory given  Plan Discussed with: CRNA  Anesthesia Plan Comments:        Anesthesia Quick Evaluation

## 2022-08-14 NOTE — ED Notes (Addendum)
Wife is going to go home for the night, she would like for someone to notify her if he gets a room. [phone]

## 2022-08-14 NOTE — Care Management Obs Status (Signed)
[facility] NOTIFICATION   Patient Details  Name: BLANCHARD WILLHITE MRN: [id number] Date of Birth: 1933/01/26   Medicare Observation Status Notification Given:  Yes    Roseanne Kaufman, RN 08/14/2022, 8:32 AM

## 2022-08-14 NOTE — Progress Notes (Addendum)
[facility] PROGRESS NOTE    Progress Note  Jeffrey Meadows  ZSW:[id number] DOB: Dec 28, 1932 DOA: 07/19/2022 PCP: [name], MD     Brief Narrative:   Jeffrey Meadows is an 89 y.o. male past medical history significant for hypertension, COPD alcohol abuse comes in with hematemesis and coffee-ground emesis, he relates NSAID use (meloxicam) also drinks daily.    Assessment/Plan:   Acute GI bleeding Likely due to NSAID use and alcohol use. GI has been consulted for possible intervention. Globin has remained relatively stable will continue IV fluid hydration keep n.p.o. IV PPI. FOBT is pending   Acute kidney injury: Baseline creatinine of less than 1 likely prerenal. Had urinary retention Foley was placed. Continue IV fluids strict I's and O's and daily weights. Creatinine is improving nicely with IV fluid resuscitation. Check mag level replete. Will give him a bolus of normal saline.  Alcohol abuse: Drinks daily.  Alcohol level less than 10. Started on thiamine and folate. Monitor with CIWA protocol.  Thrombocytosis: Likely reactive slowly trending down.  Leukocytosis: Likely reactive due to GI bleed possibly alcohol withdrawal although he has no signs or symptoms of alcohol withdrawal. Has remained afebrile white count persistently 17 we will continue aggressive IV fluid hydration.  Hypercalcemia: Likely due to dehydration resolved with IV fluid resuscitation.   BPH: Foley was placed started on Flomax.  COPD: Stable   DVT prophylaxis: scd Family Communication:none Status is: Observation The patient will require care spanning > 2 midnights and should be moved to inpatient because: Acute upper GI bleed.    Code Status:     Code Status Orders  (From admission, onward)           Start     Ordered   07/22/2022 2326  Full code  Continuous       Question:  By:  Answer:  Consent: discussion documented in EHR   08/04/2022 2325           Code  Status History     Date Active Date Inactive Code Status Order ID Comments User Context   08/15/2017 0950 08/15/2017 1653 Full Code [id number]  Waynetta Sandy, MD Inpatient   07/02/2014 1545 07/03/2014 2145 Full Code [id number]  Erline Levine, MD Inpatient         IV Access:   Peripheral IV   Procedures and diagnostic studies:   DG Chest 2 View  Result Date: 07/19/2022 CLINICAL DATA:  Hematemesis EXAM: CHEST - 2 VIEW COMPARISON:  08/03/2022 FINDINGS: Overpenetrated exam. Normal heart size. Aortic atherosclerosis. Severe emphysematous lung changes. No focal airspace consolidation, pleural effusion, or pneumothorax. IMPRESSION: Severe emphysematous lung changes. No acute cardiopulmonary findings. Electronically Signed   By: Davina Poke D.O.   On: 07/30/2022 17:49     Medical Consultants:   None.   Subjective:    Jeffrey Meadows denies any abdominal pain feels slightly better than when he came in anorexic  Objective:    Vitals:   08/14/22 0245 08/14/22 0300 08/14/22 0545 08/14/22 0600  BP:  (!) 154/76  (!) 153/73  Pulse: 77 80  73  Resp: '12 13  14  '$ Temp:   98 F (36.7 C)   TempSrc:   Oral   SpO2: (!) 89% 94%  100%  Weight:      Height:       SpO2: 100 %   Intake/Output Summary (Last 24 hours) at 08/14/2022 0640 Last data filed at 07/22/2022 2036 Gross per 24 hour  Intake 501 ml  Output --  Net 501 ml   Filed Weights   08/08/2022 1620  Weight: 54 kg    Exam: General exam: In no acute distress. Respiratory system: Good air movement and clear to auscultation. Cardiovascular system: S1 & S2 heard, RRR. No JVD. Gastrointestinal system: Abdomen is nondistended, soft and nontender.  Extremities: No pedal edema. Skin: No rashes, lesions or ulcers Psychiatry: Judgement and insight appear normal. Mood & affect appropriate.    Data Reviewed:    Labs: Basic Metabolic Panel: Recent Labs  Lab 08/04/2022 1646  NA 139  K 4.5  CL 94*  CO2 33*   GLUCOSE 136*  BUN 32*  CREATININE 1.30*  CALCIUM 11.0*   GFR Estimated Creatinine Clearance: 30.6 mL/min (A) (by C-G formula based on SCr of 1.3 mg/dL (H)). Liver Function Tests: Recent Labs  Lab 08/07/2022 1646  AST 25  ALT 15  ALKPHOS 69  BILITOT 1.0  PROT 7.1  ALBUMIN 3.6   No results for input(s): "LIPASE", "AMYLASE" in the last 168 hours. No results for input(s): "AMMONIA" in the last 168 hours. Coagulation profile Recent Labs  Lab 08/05/2022 1646  INR 1.1   COVID-19 Labs  No results for input(s): "DDIMER", "FERRITIN", "LDH", "CRP" in the last 72 hours.  Lab Results  Component Value Date   SARSCOV2NAA NOT DETECTED 03/19/2019    CBC: Recent Labs  Lab 07/14/2022 1646 08/14/22 0535  WBC 17.9* 17.4*  HGB 13.4 13.3  HCT 40.2 40.4  MCV 99.5 100.0  PLT 445* 427*   Cardiac Enzymes: No results for input(s): "CKTOTAL", "CKMB", "CKMBINDEX", "TROPONINI" in the last 168 hours. BNP (last 3 results) No results for input(s): "PROBNP" in the last 8760 hours. CBG: No results for input(s): "GLUCAP" in the last 168 hours. D-Dimer: No results for input(s): "DDIMER" in the last 72 hours. Hgb A1c: No results for input(s): "HGBA1C" in the last 72 hours. Lipid Profile: No results for input(s): "CHOL", "HDL", "LDLCALC", "TRIG", "CHOLHDL", "LDLDIRECT" in the last 72 hours. Thyroid function studies: No results for input(s): "TSH", "T4TOTAL", "T3FREE", "THYROIDAB" in the last 72 hours.  Invalid input(s): "FREET3" Anemia work up: No results for input(s): "VITAMINB12", "FOLATE", "FERRITIN", "TIBC", "IRON", "RETICCTPCT" in the last 72 hours. Sepsis Labs: Recent Labs  Lab 08/09/2022 1646 08/14/22 0535  WBC 17.9* 17.4*   Microbiology No results found for this or any previous visit (from the past 240 hour(s)).   Medications:    finasteride  5 mg Oral Daily   pantoprazole (PROTONIX) IV  40 mg Intravenous Q12H   Continuous Infusions:  lactated ringers 75 mL/hr at 08/14/22  0105      LOS: 0 days   Charlynne Cousins  [facility]  08/14/2022, 6:40 AM

## 2022-08-14 DEATH — deceased

## 2022-08-15 ENCOUNTER — Encounter (HOSPITAL_COMMUNITY): Payer: Self-pay | Admitting: Internal Medicine

## 2022-08-15 ENCOUNTER — Observation Stay (HOSPITAL_COMMUNITY): Payer: Medicare PPO | Admitting: Anesthesiology

## 2022-08-15 ENCOUNTER — Encounter (HOSPITAL_COMMUNITY): Admission: EM | Disposition: E | Payer: Self-pay | Source: Home / Self Care | Attending: Internal Medicine

## 2022-08-15 DIAGNOSIS — K2900 Acute gastritis without bleeding: Secondary | ICD-10-CM

## 2022-08-15 DIAGNOSIS — K222 Esophageal obstruction: Secondary | ICD-10-CM | POA: Diagnosis present

## 2022-08-15 DIAGNOSIS — K2100 Gastro-esophageal reflux disease with esophagitis, without bleeding: Secondary | ICD-10-CM

## 2022-08-15 DIAGNOSIS — X58XXXA Exposure to other specified factors, initial encounter: Secondary | ICD-10-CM | POA: Diagnosis present

## 2022-08-15 DIAGNOSIS — S2231XD Fracture of one rib, right side, subsequent encounter for fracture with routine healing: Secondary | ICD-10-CM | POA: Diagnosis not present

## 2022-08-15 DIAGNOSIS — Y92239 Unspecified place in hospital as the place of occurrence of the external cause: Secondary | ICD-10-CM | POA: Diagnosis present

## 2022-08-15 DIAGNOSIS — N179 Acute kidney failure, unspecified: Secondary | ICD-10-CM

## 2022-08-15 DIAGNOSIS — K2211 Ulcer of esophagus with bleeding: Secondary | ICD-10-CM

## 2022-08-15 DIAGNOSIS — E860 Dehydration: Secondary | ICD-10-CM | POA: Diagnosis present

## 2022-08-15 DIAGNOSIS — K920 Hematemesis: Secondary | ICD-10-CM | POA: Diagnosis not present

## 2022-08-15 DIAGNOSIS — K264 Chronic or unspecified duodenal ulcer with hemorrhage: Secondary | ICD-10-CM | POA: Diagnosis present

## 2022-08-15 DIAGNOSIS — K2210 Ulcer of esophagus without bleeding: Secondary | ICD-10-CM | POA: Diagnosis present

## 2022-08-15 DIAGNOSIS — G9341 Metabolic encephalopathy: Secondary | ICD-10-CM | POA: Diagnosis not present

## 2022-08-15 DIAGNOSIS — I110 Hypertensive heart disease with heart failure: Secondary | ICD-10-CM | POA: Diagnosis present

## 2022-08-15 DIAGNOSIS — N138 Other obstructive and reflux uropathy: Secondary | ICD-10-CM | POA: Diagnosis present

## 2022-08-15 DIAGNOSIS — E8729 Other acidosis: Secondary | ICD-10-CM | POA: Diagnosis not present

## 2022-08-15 DIAGNOSIS — F10239 Alcohol dependence with withdrawal, unspecified: Secondary | ICD-10-CM | POA: Diagnosis not present

## 2022-08-15 DIAGNOSIS — I3139 Other pericardial effusion (noninflammatory): Secondary | ICD-10-CM | POA: Diagnosis present

## 2022-08-15 DIAGNOSIS — K269 Duodenal ulcer, unspecified as acute or chronic, without hemorrhage or perforation: Secondary | ICD-10-CM | POA: Diagnosis not present

## 2022-08-15 DIAGNOSIS — J441 Chronic obstructive pulmonary disease with (acute) exacerbation: Secondary | ICD-10-CM | POA: Diagnosis present

## 2022-08-15 DIAGNOSIS — K3189 Other diseases of stomach and duodenum: Secondary | ICD-10-CM | POA: Diagnosis not present

## 2022-08-15 DIAGNOSIS — Y939 Activity, unspecified: Secondary | ICD-10-CM | POA: Diagnosis not present

## 2022-08-15 DIAGNOSIS — K922 Gastrointestinal hemorrhage, unspecified: Secondary | ICD-10-CM | POA: Diagnosis present

## 2022-08-15 DIAGNOSIS — I5031 Acute diastolic (congestive) heart failure: Secondary | ICD-10-CM | POA: Diagnosis not present

## 2022-08-15 DIAGNOSIS — J9601 Acute respiratory failure with hypoxia: Secondary | ICD-10-CM | POA: Diagnosis not present

## 2022-08-15 DIAGNOSIS — J439 Emphysema, unspecified: Secondary | ICD-10-CM | POA: Diagnosis present

## 2022-08-15 DIAGNOSIS — Z66 Do not resuscitate: Secondary | ICD-10-CM | POA: Diagnosis present

## 2022-08-15 DIAGNOSIS — Z515 Encounter for palliative care: Secondary | ICD-10-CM | POA: Diagnosis not present

## 2022-08-15 DIAGNOSIS — Z87891 Personal history of nicotine dependence: Secondary | ICD-10-CM | POA: Diagnosis not present

## 2022-08-15 DIAGNOSIS — Y900 Blood alcohol level of less than 20 mg/100 ml: Secondary | ICD-10-CM | POA: Diagnosis present

## 2022-08-15 DIAGNOSIS — I4891 Unspecified atrial fibrillation: Secondary | ICD-10-CM | POA: Diagnosis not present

## 2022-08-15 DIAGNOSIS — J690 Pneumonitis due to inhalation of food and vomit: Secondary | ICD-10-CM | POA: Diagnosis not present

## 2022-08-15 DIAGNOSIS — Z86010 Personal history of colonic polyps: Secondary | ICD-10-CM | POA: Diagnosis not present

## 2022-08-15 DIAGNOSIS — N401 Enlarged prostate with lower urinary tract symptoms: Secondary | ICD-10-CM | POA: Diagnosis not present

## 2022-08-15 DIAGNOSIS — Z789 Other specified health status: Secondary | ICD-10-CM | POA: Diagnosis not present

## 2022-08-15 DIAGNOSIS — K208 Other esophagitis without bleeding: Secondary | ICD-10-CM | POA: Diagnosis not present

## 2022-08-15 DIAGNOSIS — E785 Hyperlipidemia, unspecified: Secondary | ICD-10-CM | POA: Diagnosis present

## 2022-08-15 DIAGNOSIS — W19XXXD Unspecified fall, subsequent encounter: Secondary | ICD-10-CM | POA: Diagnosis present

## 2022-08-15 HISTORY — PX: BIOPSY: SHX5522

## 2022-08-15 HISTORY — PX: ESOPHAGEAL BRUSHING: SHX6842

## 2022-08-15 HISTORY — PX: ESOPHAGOGASTRODUODENOSCOPY (EGD) WITH PROPOFOL: SHX5813

## 2022-08-15 LAB — MRSA NEXT GEN BY PCR, NASAL: MRSA by PCR Next Gen: NOT DETECTED

## 2022-08-15 LAB — CBC
HCT: 36.1 % — ABNORMAL LOW (ref 39.0–52.0)
Hemoglobin: 11.6 g/dL — ABNORMAL LOW (ref 13.0–17.0)
MCH: 33 pg (ref 26.0–34.0)
MCHC: 32.1 g/dL (ref 30.0–36.0)
MCV: 102.8 fL — ABNORMAL HIGH (ref 80.0–100.0)
Platelets: 354 10*3/uL (ref 150–400)
RBC: 3.51 MIL/uL — ABNORMAL LOW (ref 4.22–5.81)
RDW: 12.6 % (ref 11.5–15.5)
WBC: 16.1 10*3/uL — ABNORMAL HIGH (ref 4.0–10.5)
nRBC: 0 % (ref 0.0–0.2)

## 2022-08-15 SURGERY — ESOPHAGOGASTRODUODENOSCOPY (EGD) WITH PROPOFOL
Anesthesia: Monitor Anesthesia Care

## 2022-08-15 MED ORDER — PANTOPRAZOLE INFUSION (NEW) - SIMPLE MED
8.0000 mg/h | INTRAVENOUS | Status: DC
  Administered 2022-08-15 (×2): 8 mg/h via INTRAVENOUS
  Filled 2022-08-15: qty 80
  Filled 2022-08-15 (×4): qty 100

## 2022-08-15 MED ORDER — PANTOPRAZOLE SODIUM 40 MG IV SOLR
40.0000 mg | Freq: Once | INTRAVENOUS | Status: AC
  Administered 2022-08-15: 40 mg via INTRAVENOUS
  Filled 2022-08-15: qty 10

## 2022-08-15 MED ORDER — SODIUM CHLORIDE 0.9 % IV SOLN
INTRAVENOUS | Status: AC
  Administered 2022-08-15: 1000 mL via INTRAVENOUS

## 2022-08-15 MED ORDER — PROPOFOL 500 MG/50ML IV EMUL
INTRAVENOUS | Status: DC | PRN
  Administered 2022-08-15: 50 ug/kg/min via INTRAVENOUS

## 2022-08-15 MED ORDER — LIDOCAINE 2% (20 MG/ML) 5 ML SYRINGE
INTRAMUSCULAR | Status: DC | PRN
  Administered 2022-08-15: 40 mg via INTRAVENOUS

## 2022-08-15 MED ORDER — PANTOPRAZOLE SODIUM 40 MG IV SOLR: 40.0000 mg | Freq: Two times a day (BID) | INTRAVENOUS | Status: DC

## 2022-08-15 MED ORDER — CHLORHEXIDINE GLUCONATE CLOTH 2 % EX PADS
6.0000 | MEDICATED_PAD | Freq: Every day | CUTANEOUS | Status: DC
  Administered 2022-08-15 – 2022-08-16 (×2): 6 via TOPICAL

## 2022-08-15 MED ORDER — ORAL CARE MOUTH RINSE: 15.0000 mL | OROMUCOSAL | Status: DC | PRN

## 2022-08-15 MED ORDER — PROPOFOL 10 MG/ML IV BOLUS
INTRAVENOUS | Status: DC | PRN
  Administered 2022-08-15: 10 mg via INTRAVENOUS
  Administered 2022-08-15: 20 mg via INTRAVENOUS
  Administered 2022-08-15 (×2): 10 mg via INTRAVENOUS

## 2022-08-15 MED ORDER — LACTATED RINGERS IV SOLN
INTRAVENOUS | Status: AC | PRN
  Administered 2022-08-15: 1000 mL via INTRAVENOUS

## 2022-08-15 SURGICAL SUPPLY — 15 items

## 2022-08-15 NOTE — Progress Notes (Addendum)
Patient down to Endoscopy at this time for EGD. CCMD notified

## 2022-08-15 NOTE — Transfer of Care (Signed)
Immediate Anesthesia Transfer of Care Note  Patient: TRAMELL PIECHOTA  Procedure(s) Performed: ESOPHAGOGASTRODUODENOSCOPY (EGD) WITH PROPOFOL  Patient Location: PACU  Anesthesia Type:MAC  Level of Consciousness: awake, alert , oriented, and patient cooperative  Airway & Oxygen Therapy: Patient Spontanous Breathing and Patient connected to face mask oxygen  Post-op Assessment: Report given to RN and Post -op Vital signs reviewed and stable  Post vital signs: Reviewed and stable  Last Vitals:  Vitals Value Taken Time  BP    Temp    Pulse 96 09/11/2022 0825  Resp 22 08/30/2022 0825  SpO2 100 % 09/12/2022 0825  Vitals shown include unvalidated device data.  Last Pain:  Vitals:   09/05/2022 2951  TempSrc: Tympanic  PainSc: 4          Complications: No notable events documented.

## 2022-08-15 NOTE — Progress Notes (Signed)
I updated Leno Mathes, the wife on patient scheduled to go down for EGD between 0645 - 0700

## 2022-08-15 NOTE — Anesthesia Postprocedure Evaluation (Signed)
Anesthesia Post Note  Patient: Jeffrey Meadows  Procedure(s) Performed: ESOPHAGOGASTRODUODENOSCOPY (EGD) WITH PROPOFOL BIOPSY ESOPHAGEAL BRUSHING     Patient location during evaluation: Endoscopy Anesthesia Type: MAC Level of consciousness: oriented, awake and alert and awake Pain management: pain level controlled Vital Signs Assessment: post-procedure vital signs reviewed and stable Respiratory status: spontaneous breathing, nonlabored ventilation, respiratory function stable and patient connected to nasal cannula oxygen Cardiovascular status: blood pressure returned to baseline and stable Postop Assessment: no headache, no backache and no apparent nausea or vomiting Anesthetic complications: no   No notable events documented.  Last Vitals:  Vitals:   08/30/2022 0900 08/18/2022 0930  BP: (!) 142/64 (!) 145/63  Pulse: 97 84  Resp: 19 16  Temp:    SpO2: 100% 97%    Last Pain:  Vitals:   09/09/2022 0900  TempSrc:   PainSc: 0-No pain                 Santa Lighter

## 2022-08-15 NOTE — Progress Notes (Signed)
[facility] PROGRESS NOTE    Progress Note  Jeffrey Meadows  KDT:[id number] DOB: 1933-05-26 DOA: 08/01/2022 PCP: [name], MD     Brief Narrative:   Jeffrey Meadows is an 89 y.o. male past medical history significant for hypertension, COPD alcohol abuse comes in with hematemesis and coffee-ground emesis, he relates NSAID use (meloxicam) also drinks daily.  Assessment/Plan:   Acute GI bleeding Likely due to NSAID use and alcohol use. GI was consulted recommended an EGD that showed multiple gastric ulcers, withh a large Oozing duodenal ulcer continue IV PPI. Needs stepdown, NPO. Will be placed in stepdown for close monitoring. Check CBCs every 12.   Acute kidney injury: Baseline creatinine of less than 1 likely prerenal. Had urinary retention Foley was placed. Creatinine is improving nicely with IV fluid resuscitation.  Alcohol abuse: Drinks daily.  Alcohol level less than 10. Started on thiamine and folate. Monitor with CIWA protocol.  Thrombocytosis: Likely reactive slowly trending down.  Leukocytosis: Likely reactive due to GI bleed possibly alcohol withdrawal although he has no signs or symptoms of alcohol withdrawal. Has remained afebrile white count has now started to come down, question reactive but we will keep an eye on it.  Hypercalcemia: Likely due to dehydration resolved with IV fluid resuscitation.   BPH: Foley was placed started on Flomax.  COPD: Stable   DVT prophylaxis: scd Family Communication:none Status is: Observation The patient will require care spanning > 2 midnights and should be moved to inpatient because: Acute upper GI bleed.    Code Status:     Code Status Orders  (From admission, onward)           Start     Ordered   07/27/2022 2326  Full code  Continuous       Question:  By:  Answer:  Consent: discussion documented in EHR   07/14/2022 2325           Code Status History     Date Active Date Inactive Code  Status Order ID Comments User Context   08/15/2017 0950 08/15/2017 1653 Full Code [id number]  Waynetta Sandy, MD Inpatient   07/02/2014 1545 07/03/2014 2145 Full Code [id number]  Erline Levine, MD Inpatient         IV Access:   Peripheral IV   Procedures and diagnostic studies:   DG Chest 2 View  Result Date: 07/29/2022 CLINICAL DATA:  Hematemesis EXAM: CHEST - 2 VIEW COMPARISON:  08/03/2022 FINDINGS: Overpenetrated exam. Normal heart size. Aortic atherosclerosis. Severe emphysematous lung changes. No focal airspace consolidation, pleural effusion, or pneumothorax. IMPRESSION: Severe emphysematous lung changes. No acute cardiopulmonary findings. Electronically Signed   By: Davina Poke D.O.   On: 08/05/2022 17:49     Medical Consultants:   None.   Subjective:    Jeffrey Meadows saying he is hungry  Objective:    Vitals:   08/14/2022 0830 08/18/2022 0840 09/03/2022 0850 08/31/2022 0900  BP: (!) 137/105 (!) 146/60 (!) 151/72 (!) 142/64  Pulse: 95 100 100 97  Resp: (!) '21 18 15 19  '$ Temp:      TempSrc:      SpO2: 100% 94% 91% 100%  Weight:      Height:       SpO2: 100 % O2 Flow Rate (L/min): 2 L/min   Intake/Output Summary (Last 24 hours) at 08/20/2022 0946 Last data filed at 08/23/2022 0820 Gross per 24 hour  Intake 1739 ml  Output 350 ml  Net  1389 ml    Filed Weights   08/09/2022 1620 08/31/2022 0712  Weight: 54 kg 54 kg    Exam: General exam: In no acute distress. Respiratory system: Good air movement and clear to auscultation. Cardiovascular system: S1 & S2 heard, RRR. No JVD. Gastrointestinal system: +BS non tender none distended Extremities: No pedal edema. Skin: No rashes, lesions or ulcers Psychiatry: Judgement and insight appear normal. Mood & affect appropriate.    Data Reviewed:    Labs: Basic Metabolic Panel: Recent Labs  Lab 07/25/2022 1646 08/14/22 0535 08/14/22 0805  NA 139 137  --   K 4.5 3.8  --   CL 94* 97*  --   CO2 33* 31   --   GLUCOSE 136* 102*  --   BUN 32* 35*  --   CREATININE 1.30* 1.12  --   CALCIUM 11.0* 10.2  --   MG  --   --  2.0    GFR Estimated Creatinine Clearance: 35.5 mL/min (by C-G formula based on SCr of 1.12 mg/dL). Liver Function Tests: Recent Labs  Lab 08/09/2022 1646  AST 25  ALT 15  ALKPHOS 69  BILITOT 1.0  PROT 7.1  ALBUMIN 3.6    No results for input(s): "LIPASE", "AMYLASE" in the last 168 hours. No results for input(s): "AMMONIA" in the last 168 hours. Coagulation profile Recent Labs  Lab 07/19/2022 1646  INR 1.1    COVID-19 Labs  No results for input(s): "DDIMER", "FERRITIN", "LDH", "CRP" in the last 72 hours.  Lab Results  Component Value Date   SARSCOV2NAA NOT DETECTED 03/19/2019    CBC: Recent Labs  Lab 08/08/2022 1646 08/14/22 0535 09/10/2022 0442  WBC 17.9* 17.4* 16.1*  HGB 13.4 13.3 11.6*  HCT 40.2 40.4 36.1*  MCV 99.5 100.0 102.8*  PLT 445* 427* 354    Cardiac Enzymes: No results for input(s): "CKTOTAL", "CKMB", "CKMBINDEX", "TROPONINI" in the last 168 hours. BNP (last 3 results) No results for input(s): "PROBNP" in the last 8760 hours. CBG: No results for input(s): "GLUCAP" in the last 168 hours. D-Dimer: No results for input(s): "DDIMER" in the last 72 hours. Hgb A1c: No results for input(s): "HGBA1C" in the last 72 hours. Lipid Profile: No results for input(s): "CHOL", "HDL", "LDLCALC", "TRIG", "CHOLHDL", "LDLDIRECT" in the last 72 hours. Thyroid function studies: No results for input(s): "TSH", "T4TOTAL", "T3FREE", "THYROIDAB" in the last 72 hours.  Invalid input(s): "FREET3" Anemia work up: No results for input(s): "VITAMINB12", "FOLATE", "FERRITIN", "TIBC", "IRON", "RETICCTPCT" in the last 72 hours. Sepsis Labs: Recent Labs  Lab 07/22/2022 1646 08/14/22 0535 08/29/2022 0442  WBC 17.9* 17.4* 16.1*    Microbiology No results found for this or any previous visit (from the past 240 hour(s)).   Medications:    [MAR Hold]  finasteride  5 mg Oral Daily   [MAR Hold] folic acid  1 mg Oral Daily   [MAR Hold] multivitamin with minerals  1 tablet Oral Daily   [MAR Hold] thiamine  100 mg Oral Daily   Or   [MAR Hold] thiamine  100 mg Intravenous Daily   Continuous Infusions:  pantoprazole 8 mg/hr (08/16/2022 0930)      LOS: 1 day   Charlynne Cousins  [facility]  09/10/2022, 9:46 AM

## 2022-08-15 NOTE — Op Note (Signed)
[facility] Patient Name: Jeffrey Meadows Procedure Date: 08/18/2022 MRN: [id number] Attending MD: Lear Ng , MD, [id number] Date of Birth: September 25, 1932 CSN: [id number] Age: 89 Admit Type: Inpatient Procedure:                Upper GI endoscopy Indications:              Suspected upper gastrointestinal bleeding,                            Coffee-ground emesis Providers:                Lear Ng, MD, Helane Rima, RN,                            William Dalton, Technician Referring MD:             hospital team Medicines:                Propofol per Anesthesia, Monitored Anesthesia Care Complications:            No immediate complications. Estimated Blood Loss:     Estimated blood loss was minimal. Procedure:                Pre-Anesthesia Assessment:                           - Prior to the procedure, a History and Physical                            was performed, and patient medications and                            allergies were reviewed. The patient's tolerance of                            previous anesthesia was also reviewed. The risks                            and benefits of the procedure and the sedation                            options and risks were discussed with the patient.                            All questions were answered, and informed consent                            was obtained. Prior Anticoagulants: The patient has                            taken no anticoagulant or antiplatelet agents. ASA                            Grade Assessment: IV - A patient with severe  systemic disease that is a constant threat to life.                            After reviewing the risks and benefits, the patient                            was deemed in satisfactory condition to undergo the                            procedure.                           After obtaining informed consent, the endoscope was                             passed under direct vision. Throughout the                            procedure, the patient's blood pressure, pulse, and                            oxygen saturations were monitored continuously. The                            [id number]-[id number] ([id number]) Olympus endoscope was introduced                            through the mouth, and advanced to the second part                            of duodenum. The upper GI endoscopy was somewhat                            difficult due to duodenal ulcer. Successful                            completion of the procedure was aided by performing                            the maneuvers documented (below) in this report.                            The patient tolerated the procedure fairly well. Scope In: Scope Out: Findings:      LA Grade D (one or more mucosal breaks involving at least 75% of       esophageal circumference) esophagitis with no bleeding was found in the       distal esophagus. Biopsies were taken with a cold forceps for histology.       Estimated blood loss was minimal. Cells for cytology were obtained by       brushing. Estimated blood loss was minimal.      Patchy severe inflammation characterized by congestion (edema),       erosions, erythema and shallow ulcerations was found in the gastric body  and in the gastric antrum. Biopsies were taken with a cold forceps for       histology. Estimated blood loss was minimal.      A low-grade of narrowing Schatzki ring was found at the gastroesophageal       junction.      Localized moderate mucosal changes characterized by congestion and       erythema were found in the cardia. Biopsies were taken with a cold       forceps for histology. Estimated blood loss was minimal.      Few oozing cratered duodenal ulcers with pigmented material were found       in the duodenal bulb. The largest lesion was 25 mm in largest dimension.      Bleeding from distal edge of ulcer stopped  spontaneously. No visible       vessel seen.      The second portion of the duodenum was normal.      Many superficial esophageal ulcers were found in the distal esophagus.      Segmental severe mucosal changes characterized by congestion, erythema       and erosion were found in the duodenal bulb. Biopsies were taken with a       cold forceps for histology. Estimated blood loss was minimal. Impression:               - LA Grade D reflux esophagitis with no bleeding.                            Rule out Barrett's esophagus. Biopsied. Cells for                            cytology obtained.                           - Acute gastritis. Biopsied.                           - Low-grade of narrowing Schatzki ring.                           - Congested and erythematous mucosa in the cardia.                            Biopsied.                           - Oozing duodenal ulcers with pigmented material.                           - Normal second portion of the duodenum.                           - Esophageal ulcers.                           - Mucosal changes in the duodenum. Biopsied. Moderate Sedation:      N/A - MAC procedure Recommendation:           - NPO.                           -  Give Protonix (pantoprazole): initiate therapy                            with 40 mg IV bolus, then 8 mg/hr IV by continuous                            infusion.                           - Observe patient's clinical course.                           - Await pathology results.                           - Transfer to step-down unit. Procedure Code(s):        --- Professional ---                           [phone], Esophagogastroduodenoscopy, flexible,                            transoral; with biopsy, single or multiple Diagnosis Code(s):        --- Professional ---                           K92.0, Hematemesis                           K22.10, Ulcer of esophagus without bleeding                           K26.4,  Chronic or unspecified duodenal ulcer with                            hemorrhage                           K29.00, Acute gastritis without bleeding                           K22.2, Esophageal obstruction                           K31.89, Other diseases of stomach and duodenum                           K21.00, Gastro-esophageal reflux disease with                            esophagitis, without bleeding CPT copyright 2022 [employer]. All rights reserved. The codes documented in this report are preliminary and upon coder review may  be revised to meet current compliance requirements. Lear Ng, MD 08/14/2022 8:33:02 AM This report has been signed electronically. Number of Addenda: 0

## 2022-08-15 NOTE — Interval H&P Note (Signed)
History and Physical Interval Note:  08/20/2022 7:46 AM  Jeffrey Meadows  has presented today for surgery, with the diagnosis of coffee ground emesis, abdominal pain, nausea, vomiting.  The various methods of treatment have been discussed with the patient and family. After consideration of risks, benefits and other options for treatment, the patient has consented to  Procedure(s): ESOPHAGOGASTRODUODENOSCOPY (EGD) WITH PROPOFOL (N/A) as a surgical intervention.  The patient's history has been reviewed, patient examined, no change in status, stable for surgery.  I have reviewed the patient's chart and labs.  Questions were answered to the patient's satisfaction.     Lear Ng

## 2022-08-16 ENCOUNTER — Inpatient Hospital Stay (HOSPITAL_COMMUNITY): Payer: Medicare PPO

## 2022-08-16 ENCOUNTER — Encounter (HOSPITAL_COMMUNITY): Payer: Self-pay | Admitting: Gastroenterology

## 2022-08-16 DIAGNOSIS — R4182 Altered mental status, unspecified: Secondary | ICD-10-CM

## 2022-08-16 DIAGNOSIS — N179 Acute kidney failure, unspecified: Secondary | ICD-10-CM | POA: Diagnosis not present

## 2022-08-16 DIAGNOSIS — K269 Duodenal ulcer, unspecified as acute or chronic, without hemorrhage or perforation: Secondary | ICD-10-CM | POA: Diagnosis not present

## 2022-08-16 DIAGNOSIS — K922 Gastrointestinal hemorrhage, unspecified: Secondary | ICD-10-CM | POA: Diagnosis not present

## 2022-08-16 DIAGNOSIS — K208 Other esophagitis without bleeding: Secondary | ICD-10-CM

## 2022-08-16 DIAGNOSIS — J189 Pneumonia, unspecified organism: Secondary | ICD-10-CM | POA: Insufficient documentation

## 2022-08-16 DIAGNOSIS — I4891 Unspecified atrial fibrillation: Secondary | ICD-10-CM

## 2022-08-16 DIAGNOSIS — J690 Pneumonitis due to inhalation of food and vomit: Secondary | ICD-10-CM

## 2022-08-16 LAB — CBC
HCT: 35.7 % — ABNORMAL LOW (ref 39.0–52.0)
Hemoglobin: 11.3 g/dL — ABNORMAL LOW (ref 13.0–17.0)
MCH: 32.9 pg (ref 26.0–34.0)
MCHC: 31.7 g/dL (ref 30.0–36.0)
MCV: 104.1 fL — ABNORMAL HIGH (ref 80.0–100.0)
Platelets: 308 10*3/uL (ref 150–400)
RBC: 3.43 MIL/uL — ABNORMAL LOW (ref 4.22–5.81)
RDW: 12.4 % (ref 11.5–15.5)
WBC: 10.2 10*3/uL (ref 4.0–10.5)
nRBC: 0 % (ref 0.0–0.2)

## 2022-08-16 LAB — ECHOCARDIOGRAM COMPLETE
AR max vel: 2.24 cm2
AV Area VTI: 2.28 cm2
AV Area mean vel: 2.25 cm2
AV Mean grad: 4 mmHg
AV Peak grad: 6.5 mmHg
Ao pk vel: 1.27 m/s
Area-P 1/2: 5.02 cm2
Calc EF: 58.4 %
Height: 65 in
S' Lateral: 3.1 cm
Single Plane A2C EF: 63.6 %
Single Plane A4C EF: 50.9 %
Weight: 1904.77 oz

## 2022-08-16 LAB — HIV ANTIBODY (ROUTINE TESTING W REFLEX): HIV Screen 4th Generation wRfx: NONREACTIVE

## 2022-08-16 LAB — COMPREHENSIVE METABOLIC PANEL
ALT: 13 U/L (ref 0–44)
AST: 16 U/L (ref 15–41)
Albumin: 2.7 g/dL — ABNORMAL LOW (ref 3.5–5.0)
Alkaline Phosphatase: 45 U/L (ref 38–126)
Anion gap: 8 (ref 5–15)
BUN: 36 mg/dL — ABNORMAL HIGH (ref 8–23)
CO2: 27 mmol/L (ref 22–32)
Calcium: 8.9 mg/dL (ref 8.9–10.3)
Chloride: 106 mmol/L (ref 98–111)
Creatinine, Ser: 0.83 mg/dL (ref 0.61–1.24)
GFR, Estimated: >60 mL/min (ref >60)
Glucose, Bld: 94 mg/dL (ref 70–99)
Potassium: 3.6 mmol/L (ref 3.5–5.1)
Sodium: 141 mmol/L (ref 135–145)
Total Bilirubin: 1 mg/dL (ref 0.3–1.2)
Total Protein: 5.2 g/dL — ABNORMAL LOW (ref 6.5–8.1)

## 2022-08-16 LAB — TSH: TSH: 0.643 u[IU]/mL (ref 0.350–4.500)

## 2022-08-16 LAB — STREP PNEUMONIAE URINARY ANTIGEN: Strep Pneumo Urinary Antigen: NEGATIVE

## 2022-08-16 LAB — TROPONIN I (HIGH SENSITIVITY)
Troponin I (High Sensitivity): 24 ng/L — ABNORMAL HIGH (ref <18)
Troponin I (High Sensitivity): 30 ng/L — ABNORMAL HIGH (ref <18)

## 2022-08-16 LAB — CYTOLOGY - NON PAP

## 2022-08-16 LAB — SURGICAL PATHOLOGY

## 2022-08-16 LAB — MAGNESIUM: Magnesium: 2.2 mg/dL (ref 1.7–2.4)

## 2022-08-16 MED ORDER — POTASSIUM CHLORIDE 10 MEQ/100ML IV SOLN
10.0000 meq | INTRAVENOUS | Status: AC
  Administered 2022-08-16 (×4): 10 meq via INTRAVENOUS
  Filled 2022-08-16 (×4): qty 100

## 2022-08-16 MED ORDER — LORAZEPAM 2 MG/ML IJ SOLN
1.0000 mg | INTRAMUSCULAR | Status: DC | PRN
  Administered 2022-08-17: 2 mg via INTRAVENOUS
  Filled 2022-08-16 (×2): qty 1

## 2022-08-16 MED ORDER — TIOTROPIUM BROMIDE-OLODATEROL 2.5-2.5 MCG/ACT IN AERS: INHALATION_SPRAY | Freq: Two times a day (BID) | RESPIRATORY_TRACT | Status: DC

## 2022-08-16 MED ORDER — DILTIAZEM LOAD VIA INFUSION
10.0000 mg | Freq: Once | INTRAVENOUS | Status: AC
  Administered 2022-08-16: 10 mg via INTRAVENOUS
  Filled 2022-08-16: qty 10

## 2022-08-16 MED ORDER — METOPROLOL TARTRATE 5 MG/5ML IV SOLN: 5.0000 mg | Freq: Four times a day (QID) | INTRAVENOUS | Status: DC | PRN

## 2022-08-16 MED ORDER — SODIUM CHLORIDE 0.9 % IV SOLN
500.0000 mg | INTRAVENOUS | Status: DC
  Administered 2022-08-16: 500 mg via INTRAVENOUS
  Filled 2022-08-16: qty 5

## 2022-08-16 MED ORDER — LORAZEPAM 1 MG PO TABS: 1.0000 mg | ORAL_TABLET | ORAL | Status: DC | PRN

## 2022-08-16 MED ORDER — LEVALBUTEROL HCL 0.63 MG/3ML IN NEBU: 0.6300 mg | INHALATION_SOLUTION | Freq: Three times a day (TID) | RESPIRATORY_TRACT | Status: DC

## 2022-08-16 MED ORDER — DILTIAZEM HCL-DEXTROSE 125-5 MG/125ML-% IV SOLN (PREMIX)
5.0000 mg/h | INTRAVENOUS | Status: DC
  Administered 2022-08-16: 5 mg/h via INTRAVENOUS
  Filled 2022-08-16: qty 125

## 2022-08-16 MED ORDER — SODIUM CHLORIDE 0.9 % IV SOLN
3.0000 g | Freq: Four times a day (QID) | INTRAVENOUS | Status: DC
  Administered 2022-08-16 – 2022-08-17 (×4): 3 g via INTRAVENOUS
  Filled 2022-08-16 (×4): qty 8

## 2022-08-16 MED ORDER — CALCIUM POLYCARBOPHIL 625 MG PO TABS: 625.0000 mg | ORAL_TABLET | Freq: Every day | ORAL | Status: DC

## 2022-08-16 MED ORDER — CALCIUM CARBONATE ANTACID 500 MG PO CHEW: 1.0000 | CHEWABLE_TABLET | ORAL | Status: DC | PRN

## 2022-08-16 MED ORDER — IPRATROPIUM BROMIDE 0.02 % IN SOLN: 0.5000 mg | Freq: Three times a day (TID) | RESPIRATORY_TRACT | Status: DC

## 2022-08-16 MED ORDER — SODIUM CHLORIDE 0.9 % IV SOLN: INTRAVENOUS | Status: DC

## 2022-08-16 MED ORDER — LEVALBUTEROL HCL 0.63 MG/3ML IN NEBU
0.6300 mg | INHALATION_SOLUTION | Freq: Three times a day (TID) | RESPIRATORY_TRACT | Status: DC
  Administered 2022-08-16 – 2022-08-17 (×2): 0.63 mg via RESPIRATORY_TRACT
  Filled 2022-08-16 (×2): qty 3

## 2022-08-16 MED ORDER — IPRATROPIUM BROMIDE 0.02 % IN SOLN
0.5000 mg | Freq: Three times a day (TID) | RESPIRATORY_TRACT | Status: DC
  Administered 2022-08-16 – 2022-08-17 (×2): 0.5 mg via RESPIRATORY_TRACT
  Filled 2022-08-16 (×2): qty 2.5

## 2022-08-16 MED ORDER — FOLIC ACID 5 MG/ML IJ SOLN
1.0000 mg | Freq: Every day | INTRAMUSCULAR | Status: DC
  Administered 2022-08-16: 1 mg via INTRAVENOUS
  Filled 2022-08-16 (×2): qty 0.2

## 2022-08-16 MED ORDER — SODIUM CHLORIDE 0.9 % IV SOLN: 1.0000 mg | Freq: Every day | INTRAVENOUS | Status: DC

## 2022-08-16 MED ORDER — MOMETASONE FURO-FORMOTEROL FUM 200-5 MCG/ACT IN AERO
2.0000 | INHALATION_SPRAY | Freq: Two times a day (BID) | RESPIRATORY_TRACT | Status: DC
  Filled 2022-08-16: qty 8.8

## 2022-08-16 MED ORDER — IPRATROPIUM-ALBUTEROL 0.5-2.5 (3) MG/3ML IN SOLN
3.0000 mL | Freq: Three times a day (TID) | RESPIRATORY_TRACT | Status: DC
  Administered 2022-08-16: 3 mL via RESPIRATORY_TRACT
  Filled 2022-08-16 (×2): qty 3

## 2022-08-16 NOTE — Progress Notes (Signed)
At approximately [place] notified this RN that patient went into Afib RVR HR in 130s. [facility] paged and notified. EKG performed and confirmed Afib RVR. Cardiziem gtt started at [phone]

## 2022-08-16 NOTE — Progress Notes (Signed)
PROGRESS NOTE    Jeffrey Meadows  BOF:[id number] DOB: [birth year] DOA: 07/26/2022 PCP: [name], MD    Chief Complaint  Patient presents with   Emesis   Urinary Retention    Brief Narrative:  Jeffrey Meadows is an 89 y.o. male past medical history significant for hypertension, COPD alcohol abuse comes in with hematemesis and coffee-ground emesis, he relates NSAID use (meloxicam) also drinks daily.  Patient seen by GI, underwent upper endoscopy noted multiple duodenal ulcers as well as large ulcers as well as LA grade D esophagitis and gastritis.  Patient maintained on IV PPI/PPI drip.  Patient noted to transiently go into A-fib on the night of 09/02/2022 noted to go into probable alcohol withdrawal.  Patient also noted with some hypoxia, chest x-ray done concerning for pneumonia.   Assessment & Plan:   Principal Problem:   Acute GI bleeding Active Problems:   Multiple duodenal ulcers   Esophagitis, [place] grade D   COPD with chronic bronchitis and emphysema (HCC)   BPH with obstruction/lower urinary tract symptoms   AKI (acute kidney injury) (HCC)   Hypercalcemia   Leukocytosis   Thrombocytosis   Alcohol use   Acute upper GI bleed   Aspiration pneumonia (HCC)   CAP (community acquired pneumonia)  #1 acute upper GI bleed secondary to duodenal ulcers/LA grade D esophagitis -Patient had presented with hematemesis, history of alcohol use and NSAID use. -Patient seen in consultation by GI underwent upper endoscopy that showed multiple duodenal ulcers with large oozing duodenal ulcer and multiple duodenal ulcers placed on the Protonix drip. -Patient with no further hematemesis, sedated. -Hemoglobin currently stable at 11.3. -Continue n.p.o. until patient more alert. -GI following and appreciate input and recommendations.  2.  Transient A-fib -Likely secondary to upper GI bleed and alcohol withdrawal. -Patient back in normal sinus rhythm. -Cycle cardiac enzymes, check  a TSH, 2D echo. -Discontinue Cardizem drip. -Follow for now. -If patient goes back into A-fib may need to be placed on beta-blocker or Cardizem for rate control. -Unable to anticoagulate due to upper GI bleed.  3.  Alcohol dependence -Patient with significant alcohol use per wife. - noted to go into alcohol withdrawal overnight, sedated with Ativan currently. -Folic acid, multivitamin, thiamine. -Continue Ativan CIWA protocol. -Once more alert will place on the Librium detox protocol.  4.  Aspiration pneumonia versus CAP -Patient noted to be hypoxic overnight currently on a Venturi mask. -Chest x-ray done this morning with new worsening patchy airspace disease in the lingula suspicious for developing infection in the correct clinical setting. -Patient presented with nausea hematemesis concern for aspiration pneumonia. -Check a urine Legionella antigen, urine pneumococcal antigen .-Please empirically on IV Unasyn, IV azithromycin. -Placed on scheduled Xopenex and Atrovent nebs. -Supportive care.  5.  AKI -Foley catheter placed, renal function improved with hydration.  6.  Urinary retention./BPH -Foley catheter placed, voiding trial in 5 days. -Flomax.  7.  COPD -Stable. -Place on scheduled Xopenex and Atrovent nebs.  8.  Leukocytosis -Initial concern for reactive due to GI bleed in the setting of alcohol withdrawal. -Patient noted to be hypoxic, requiring Venturi mask, chest x-ray done concerning for possible pneumonia. -Check a urine Legionella antigen, urine pneumococcal antigen. -Place empirically on IV Unasyn, IV azithromycin due to concerns for pneumonia.  9.  Hypercalcemia -Secondary to dehydration. -Improved with IVF   DVT prophylaxis: SCDs Code Status: Full Family Communication: Updated wife at bedside. Disposition: TBD  Status is: Inpatient Remains inpatient appropriate because:  Severity of illness   Consultants:  Gastroenterology: Dr. Paulita Fujita  08/14/2022  Procedures:  Chest x-ray 08/16/2022, 07/30/2022 2D echo pending 08/16/2022 Upper endoscopy 09/09/2022 per GI, Dr. Michail Sermon   Antimicrobials:  IV Unasyn 08/16/2022>>>>> IV azithromycin 08/16/2022 >>>>>   Subjective: Sedated. Noted to go into afib overnight, now in NSR. On VM due to hypoxia overnight.  Some response to noxious stimuli however sedated.  Per RN patient noted to be going through alcohol withdrawal prior to going into A-fib and received about 4 mg of Ativan overnight.  Objective: Vitals:   08/16/22 1200 08/16/22 1300 08/16/22 1400 08/16/22 1500  BP: (!) 150/66 (!) 152/77 (!) 162/67 (!) 164/65  Pulse: 62 75  (!) 52  Resp: (!) '21 18 18 19  '$ Temp: (!) 97 F (36.1 C)     TempSrc: Axillary     SpO2:  100%  (!) 88%  Weight:      Height:        Intake/Output Summary (Last 24 hours) at 08/16/2022 1547 Last data filed at 08/16/2022 0800 Gross per 24 hour  Intake 160.46 ml  Output --  Net 160.46 ml   Filed Weights   07/22/2022 1620 08/21/2022 0712  Weight: 54 kg 54 kg    Examination:  General exam: Appears calm and comfortable  Respiratory system: Some coarse BS anterior lung fields.  No wheezes, no crackles, no rhonchi.  Fair air movement.  On Venturi mask.   Cardiovascular system: S1 & S2 heard, RRR. No JVD, murmurs, rubs, gallops or clicks. No pedal edema. Gastrointestinal system: Abdomen is nondistended, soft and TTP in the epigastric region.  Positive bowel sounds.  No rebound.  No guarding.  Central nervous system: Sedated.  Moving extremities spontaneously.  Extremities: Symmetric 5 x 5 power. Skin: No rashes, lesions or ulcers Psychiatry: Judgement and insight unable to assess.  Mood and affect unable to assess.     Data Reviewed: I have personally reviewed following labs and imaging studies  CBC: Recent Labs  Lab 07/16/2022 1646 08/14/22 0535 08/21/2022 0442 08/16/22 0249  WBC 17.9* 17.4* 16.1* 10.2  HGB 13.4 13.3 11.6* 11.3*  HCT 40.2 40.4 36.1* 35.7*   MCV 99.5 100.0 102.8* 104.1*  PLT 445* 427* 354 825    Basic Metabolic Panel: Recent Labs  Lab 08/12/2022 1646 08/14/22 0535 08/14/22 0805 08/16/22 0249 08/16/22 1024  NA 139 137  --  141  --   K 4.5 3.8  --  3.6  --   CL 94* 97*  --  106  --   CO2 33* 31  --  27  --   GLUCOSE 136* 102*  --  94  --   BUN 32* 35*  --  36*  --   CREATININE 1.30* 1.12  --  0.83  --   CALCIUM 11.0* 10.2  --  8.9  --   MG  --   --  2.0  --  2.2    GFR: Estimated Creatinine Clearance: 47.9 mL/min (by C-G formula based on SCr of 0.83 mg/dL).  Liver Function Tests: Recent Labs  Lab 07/20/2022 1646 08/16/22 0249  AST 25 16  ALT 15 13  ALKPHOS 69 45  BILITOT 1.0 1.0  PROT 7.1 5.2*  ALBUMIN 3.6 2.7*    CBG: No results for input(s): "GLUCAP" in the last 168 hours.   Recent Results (from the past 240 hour(s))  MRSA Next Gen by PCR, Nasal     Status: None   Collection Time: 08/26/2022  2:36 PM   Specimen: Nasal Mucosa; Nasal Swab  Result Value Ref Range Status   MRSA by PCR Next Gen NOT DETECTED NOT DETECTED Final    Comment: (NOTE) The GeneXpert MRSA Assay (FDA approved for NASAL specimens only), is one component of a comprehensive MRSA colonization surveillance program. It is not intended to diagnose MRSA infection nor to guide or monitor treatment for MRSA infections. Test performance is not FDA approved in patients less than 12 years old. Performed at [facility], [place] [address] [place], [place] [address]          Radiology Studies: ECHOCARDIOGRAM COMPLETE  Result Date: 08/16/2022    ECHOCARDIOGRAM REPORT   Patient Name:   JAROD BOZZO Serpa Date of Exam: 08/16/2022 Medical Rec #:  [id number]       Height:       65.0 in Accession #:    [id number]      Weight:       119.0 lb Date of Birth:  02-20-1933       BSA:          1.587 m Patient Age:    9 years        BP:           162/67 mmHg Patient Gender: M               HR:           70 bpm. Exam Location:  Inpatient  Procedure: 2D Echo Indications:    atrial fibrillation  History:        Patient has prior history of Echocardiogram examinations, most                 recent 07/16/2020.  Sonographer:    Harvie Junior Referring Phys: [phone] Cedric Denison V Taysha Majewski  Sonographer Comments: Technically difficult study due to poor echo windows and echo performed with patient supine and on artificial respirator. Image acquisition challenging due to patient behavioral factors., Image acquisition challenging due to uncooperative patient and Image acquisition challenging due to respiratory motion. IMPRESSIONS  1. Left ventricular ejection fraction, by estimation, is 55 to 60%. The left ventricle has normal function. Left ventricular endocardial border not optimally defined to evaluate regional wall motion. Left ventricular diastolic parameters are consistent with Grade I diastolic dysfunction (impaired relaxation).  2. Right ventricular systolic function is normal. The right ventricular size is normal.  3. A small pericardial effusion is present. The pericardial effusion is anterior to the right ventricle.  4. The mitral valve is degenerative. No evidence of mitral valve regurgitation.  5. The aortic valve was not well visualized. Aortic valve regurgitation is not visualized.  6. The inferior vena cava is normal in size with greater than 50% respiratory variability, suggesting right atrial pressure of 3 mmHg. Comparison(s): Similar to prior; this study is more technically challenging. FINDINGS  Left Ventricle: Left ventricular ejection fraction, by estimation, is 55 to 60%. The left ventricle has normal function. Left ventricular endocardial border not optimally defined to evaluate regional wall motion. The left ventricular internal cavity size was small. There is no left ventricular hypertrophy. Left ventricular diastolic parameters are consistent with Grade I diastolic dysfunction (impaired relaxation). Right Ventricle: The right ventricular size is  normal. Right vetricular wall thickness was not well visualized. Right ventricular systolic function is normal. Left Atrium: Left atrial size was normal in size. Right Atrium: Right atrial size was normal in size. Pericardium: A small pericardial effusion is present. The  pericardial effusion is anterior to the right ventricle. Mitral Valve: The mitral valve is degenerative in appearance. No evidence of mitral valve regurgitation. Tricuspid Valve: The tricuspid valve is not well visualized. Tricuspid valve regurgitation is not demonstrated. Aortic Valve: The aortic valve was not well visualized. Aortic valve regurgitation is not visualized. Aortic valve mean gradient measures 4.0 mmHg. Aortic valve peak gradient measures 6.5 mmHg. Aortic valve area, by VTI measures 2.28 cm. Pulmonic Valve: The pulmonic valve was not well visualized. Pulmonic valve regurgitation is not visualized. Aorta: The aortic arch was not well visualized, the ascending aorta was not well visualized and the aortic root was not well visualized. Venous: The inferior vena cava is normal in size with greater than 50% respiratory variability, suggesting right atrial pressure of 3 mmHg. IAS/Shunts: No atrial level shunt detected by color flow Doppler.  LEFT VENTRICLE PLAX 2D LVIDd:         3.75 cm     Diastology LVIDs:         3.10 cm     LV e' medial:    8.38 cm/s LV PW:         0.95 cm     LV E/e' medial:  14.1 LV IVS:        1.10 cm     LV e' lateral:   10.60 cm/s LVOT diam:     2.20 cm     LV E/e' lateral: 11.1 LV SV:         52 LV SV Index:   33 LVOT Area:     3.80 cm  LV Volumes (MOD) LV vol d, MOD A2C: 83.8 ml LV vol d, MOD A4C: 95.3 ml LV vol s, MOD A2C: 30.5 ml LV vol s, MOD A4C: 46.8 ml LV SV MOD A2C:     53.3 ml LV SV MOD A4C:     95.3 ml LV SV MOD BP:      53.1 ml RIGHT VENTRICLE RV Basal diam:  4.30 cm RV Mid diam:    3.70 cm RV S prime:     18.70 cm/s TAPSE (M-mode): 2.3 cm LEFT ATRIUM           Index        RIGHT ATRIUM           Index  LA diam:      2.90 cm 1.83 cm/m   RA Area:     12.90 cm LA Vol (A4C): 42.5 ml 26.78 ml/m  RA Volume:   32.60 ml  20.55 ml/m  AORTIC VALVE                    PULMONIC VALVE AV Area (Vmax):    2.24 cm     PV Vmax:       1.06 m/s AV Area (Vmean):   2.25 cm     PV Peak grad:  4.5 mmHg AV Area (VTI):     2.28 cm AV Vmax:           127.00 cm/s AV Vmean:          92.600 cm/s AV VTI:            0.227 m AV Peak Grad:      6.5 mmHg AV Mean Grad:      4.0 mmHg LVOT Vmax:         74.80 cm/s LVOT Vmean:        54.900 cm/s LVOT VTI:  0.136 m LVOT/AV VTI ratio: 0.60  AORTA Ao Root diam: 3.80 cm Ao Asc diam:  3.80 cm MITRAL VALVE                TRICUSPID VALVE MV Area (PHT): 5.02 cm     TR Peak grad:   35.0 mmHg MV Decel Time: 151 msec     TR Vmax:        296.00 cm/s MV E velocity: 118.00 cm/s MV A velocity: 118.00 cm/s  SHUNTS MV E/A ratio:  1.00         Systemic VTI:  0.14 m                             Systemic Diam: 2.20 cm Rudean Haskell MD Electronically signed by Rudean Haskell MD Signature Date/Time: 08/16/2022/3:13:26 PM    Final    DG CHEST PORT 1 VIEW  Result Date: 08/16/2022 CLINICAL DATA:  Hypoxia EXAM: PORTABLE CHEST 1 VIEW COMPARISON:  Chest radiograph 08/06/2022 FINDINGS: The cardiomediastinal silhouette is stable and within normal limits. There is new/worsening patchy airspace disease in the lingula. Emphysematous changes are stable. There is no pleural effusion or pneumothorax There is no acute osseous abnormality. IMPRESSION: New/worsening patchy airspace disease in the lingula suspicious for developing infection in the correct clinical setting. Electronically Signed   By: Valetta Mole M.D.   On: 08/16/2022 08:41        Scheduled Meds:  Chlorhexidine Gluconate Cloth  6 each Topical Daily   finasteride  5 mg Oral Daily   folic acid  1 mg Intravenous Daily   ipratropium-albuterol  3 mL Nebulization TID   mometasone-formoterol  2 puff Inhalation BID   multivitamin with  minerals  1 tablet Oral Daily   [START ON 08/18/2022] pantoprazole  40 mg Intravenous Q12H   polycarbophil  625 mg Oral Daily   thiamine  100 mg Oral Daily   Or   thiamine  100 mg Intravenous Daily   Continuous Infusions:  sodium chloride 100 mL/hr at 08/16/22 1040   ampicillin-sulbactam (UNASYN) IV Stopped (08/16/22 1238)   azithromycin Stopped (08/16/22 1152)   pantoprazole Stopped (08/16/22 0320)   potassium chloride 10 mEq (08/16/22 1528)     LOS: 2 days    Time spent: 40 minutes    Irine Seal, MD [facility]   To contact the attending provider between 7A-7P or the covering provider during after hours 7P-7A, please log into the web site [email or web address] and access using universal [place] password for that web site. If you do not have the password, please call the hospital operator.  08/16/2022, 3:47 PM

## 2022-08-16 NOTE — Progress Notes (Signed)
Was called by RN that patient little more lethargic, pupils reactive however right pupil constricted compared to left pupil. Went and assessed patient. -Patient lethargic, minimally responsive to verbal stimuli but responding to noxious stimuli. -Patient moving extremities spontaneously. -Patient noted to have received 2 mg of Ativan this afternoon and per RN received 8 mg of Ativan overnight. -Lethargy likely secondary to anxiolytics in the 89 year old gentleman with history of alcohol use. -Will decrease dose of Ativan as needed. -CT head. -Follow.

## 2022-08-16 NOTE — Progress Notes (Signed)
Jeffrey Meadows  Jeffrey Meadows 89 y.o. 1933-04-01   Subjective: Minimally responsive on CIWA protocol. Venturi mask. Afib with RVR overnight. Wife Jeffrey Meadows) and nurse in room.  Objective: Vital signs: Vitals:   08/16/22 0715 08/16/22 0831  BP:    Pulse: 81   Resp: 20   Temp:    SpO2: 91% 97%  T 98.3, BP 144/56  Physical Exam: Gen: somnolent, thin, elderly, no acute distress  HEENT: anicteric sclera CV: RRR Chest: CTA B Abd: diffuse tenderness with guarding, soft, nondistended, +BS Ext: no edema  Lab Results: Recent Labs    08/14/22 0535 08/14/22 0805 08/16/22 0249  NA 137  --  141  K 3.8  --  3.6  CL 97*  --  106  CO2 31  --  27  GLUCOSE 102*  --  94  BUN 35*  --  36*  CREATININE 1.12  --  0.83  CALCIUM 10.2  --  8.9  MG  --  2.0  --    Recent Labs    07/30/2022 1646 08/16/22 0249  AST 25 16  ALT 15 13  ALKPHOS 69 45  BILITOT 1.0 1.0  PROT 7.1 5.2*  ALBUMIN 3.6 2.7*   Recent Labs    09/10/2022 0442 08/16/22 0249  WBC 16.1* 10.2  HGB 11.6* 11.3*  HCT 36.1* 35.7*  MCV 102.8* 104.1*  PLT 354 308      Assessment/Plan: GI bleed due to large duodenal ulcer and extensive peptic ulcer disease noted as well without any signs of ongoing bleeding. Hgb stable at 11.3. Ulcer likely due to NSAIDs but pathology pending. On CIWA protocol for alcohol withdrawal. Keep NPO due to altered mental status. Afib treatment per hospitalist. Will follow.   Jeffrey Meadows 08/16/2022, 11:06 AM  Questions please call [phone] ID: Jeffrey Meadows, male   DOB: 09-25-1932, 89 y.o.   MRN: [id number]

## 2022-08-16 NOTE — Progress Notes (Signed)
  Transition of Care [facility]) Screening Note   Patient Details  Name: Jeffrey Meadows Date of Birth: Jul 28, 1933   Transition of Care [facility]) CM/SW Contact:    Roseanne Kaufman, RN Phone Number: 08/16/2022, 6:23 PM    Transition of Care Department [facility]) has reviewed patient and no TOC needs have been identified at this time. We will continue to monitor patient advancement through interdisciplinary progression rounds. If new patient transition needs arise, please place a TOC consult.

## 2022-08-16 NOTE — Progress Notes (Signed)
  Echocardiogram 2D Echocardiogram has been performed.  Jeffrey Meadows 08/16/2022, 2:45 PM

## 2022-08-17 ENCOUNTER — Inpatient Hospital Stay (HOSPITAL_COMMUNITY): Payer: Medicare PPO

## 2022-08-17 DIAGNOSIS — I5031 Acute diastolic (congestive) heart failure: Secondary | ICD-10-CM | POA: Diagnosis not present

## 2022-08-17 DIAGNOSIS — J189 Pneumonia, unspecified organism: Secondary | ICD-10-CM

## 2022-08-17 DIAGNOSIS — J9601 Acute respiratory failure with hypoxia: Secondary | ICD-10-CM | POA: Diagnosis not present

## 2022-08-17 DIAGNOSIS — G9341 Metabolic encephalopathy: Secondary | ICD-10-CM

## 2022-08-17 DIAGNOSIS — K269 Duodenal ulcer, unspecified as acute or chronic, without hemorrhage or perforation: Secondary | ICD-10-CM | POA: Diagnosis not present

## 2022-08-17 DIAGNOSIS — J690 Pneumonitis due to inhalation of food and vomit: Secondary | ICD-10-CM

## 2022-08-17 DIAGNOSIS — K922 Gastrointestinal hemorrhage, unspecified: Secondary | ICD-10-CM | POA: Diagnosis not present

## 2022-08-17 DIAGNOSIS — K208 Other esophagitis without bleeding: Secondary | ICD-10-CM

## 2022-08-17 DIAGNOSIS — N179 Acute kidney failure, unspecified: Secondary | ICD-10-CM | POA: Diagnosis not present

## 2022-08-17 LAB — CBC WITH DIFFERENTIAL/PLATELET
Abs Immature Granulocytes: 0.03 10*3/uL (ref 0.00–0.07)
Basophils Absolute: 0.1 10*3/uL (ref 0.0–0.1)
Basophils Relative: 1 %
Eosinophils Absolute: 0.1 10*3/uL (ref 0.0–0.5)
Eosinophils Relative: 1 %
HCT: 33.4 % — ABNORMAL LOW (ref 39.0–52.0)
Hemoglobin: 10.6 g/dL — ABNORMAL LOW (ref 13.0–17.0)
Immature Granulocytes: 0 %
Lymphocytes Relative: 12 %
Lymphs Abs: 1.3 10*3/uL (ref 0.7–4.0)
MCH: 33.4 pg (ref 26.0–34.0)
MCHC: 31.7 g/dL (ref 30.0–36.0)
MCV: 105.4 fL — ABNORMAL HIGH (ref 80.0–100.0)
Monocytes Absolute: 1.2 10*3/uL — ABNORMAL HIGH (ref 0.1–1.0)
Monocytes Relative: 11 %
Neutro Abs: 7.9 10*3/uL — ABNORMAL HIGH (ref 1.7–7.7)
Neutrophils Relative %: 75 %
Platelets: 273 10*3/uL (ref 150–400)
RBC: 3.17 MIL/uL — ABNORMAL LOW (ref 4.22–5.81)
RDW: 12.3 % (ref 11.5–15.5)
WBC: 10.5 10*3/uL (ref 4.0–10.5)
nRBC: 0 % (ref 0.0–0.2)

## 2022-08-17 LAB — BASIC METABOLIC PANEL
Anion gap: 7 (ref 5–15)
BUN: 27 mg/dL — ABNORMAL HIGH (ref 8–23)
CO2: 26 mmol/L (ref 22–32)
Calcium: 8.3 mg/dL — ABNORMAL LOW (ref 8.9–10.3)
Chloride: 111 mmol/L (ref 98–111)
Creatinine, Ser: 0.66 mg/dL (ref 0.61–1.24)
GFR, Estimated: >60 mL/min (ref >60)
Glucose, Bld: 82 mg/dL (ref 70–99)
Potassium: 3.5 mmol/L (ref 3.5–5.1)
Sodium: 144 mmol/L (ref 135–145)

## 2022-08-17 LAB — BLOOD GAS, ARTERIAL
Acid-Base Excess: 2.1 mmol/L — ABNORMAL HIGH (ref 0.0–2.0)
Bicarbonate: 33.6 mmol/L — ABNORMAL HIGH (ref 20.0–28.0)
FIO2: 55 %
O2 Content: 14 L/min
O2 Saturation: 88.7 %
Patient temperature: 37
pCO2 arterial: 92 mmHg (ref 32–48)
pH, Arterial: 7.17 — CL (ref 7.35–7.45)
pO2, Arterial: 63 mmHg — ABNORMAL LOW (ref 83–108)

## 2022-08-17 LAB — BRAIN NATRIURETIC PEPTIDE: B Natriuretic Peptide: 821.3 pg/mL — ABNORMAL HIGH (ref 0.0–100.0)

## 2022-08-17 LAB — MAGNESIUM: Magnesium: 2.1 mg/dL (ref 1.7–2.4)

## 2022-08-17 MED ORDER — LORAZEPAM 2 MG/ML IJ SOLN: 2.0000 mg | INTRAMUSCULAR | Status: DC | PRN

## 2022-08-17 MED ORDER — BUDESONIDE 0.5 MG/2ML IN SUSP: 0.5000 mg | Freq: Two times a day (BID) | RESPIRATORY_TRACT | Status: DC

## 2022-08-17 MED ORDER — MIDAZOLAM HCL 2 MG/2ML IJ SOLN
INTRAMUSCULAR | Status: AC
  Filled 2022-08-17: qty 2

## 2022-08-17 MED ORDER — MORPHINE SULFATE (PF) 2 MG/ML IV SOLN
INTRAVENOUS | Status: AC
  Administered 2022-08-17: 2 mg via INTRAVENOUS
  Filled 2022-08-17: qty 1

## 2022-08-17 MED ORDER — HALOPERIDOL LACTATE 5 MG/ML IJ SOLN: 2.5000 mg | INTRAMUSCULAR | Status: DC | PRN

## 2022-08-17 MED ORDER — SODIUM BICARBONATE 8.4 % IV SOLN
INTRAVENOUS | Status: AC
  Administered 2022-08-17: 50 meq via INTRAVENOUS
  Filled 2022-08-17: qty 50

## 2022-08-17 MED ORDER — SODIUM CHLORIDE 0.9 % IV SOLN: INTRAVENOUS | Status: DC

## 2022-08-17 MED ORDER — POTASSIUM CHLORIDE 10 MEQ/100ML IV SOLN
10.0000 meq | INTRAVENOUS | Status: AC
  Filled 2022-08-17 (×2): qty 100

## 2022-08-17 MED ORDER — FUROSEMIDE 10 MG/ML IJ SOLN
INTRAMUSCULAR | Status: AC
  Administered 2022-08-17: 40 mg via INTRAVENOUS
  Filled 2022-08-17: qty 4

## 2022-08-17 MED ORDER — METHYLPREDNISOLONE SODIUM SUCC 125 MG IJ SOLR: 120.0000 mg | Freq: Two times a day (BID) | INTRAMUSCULAR | Status: DC

## 2022-08-17 MED ORDER — MORPHINE 100MG IN NS 100ML (1MG/ML) PREMIX INFUSION
0.0000 mg/h | INTRAVENOUS | Status: DC
  Filled 2022-08-17: qty 100

## 2022-08-17 MED ORDER — LORAZEPAM 2 MG/ML IJ SOLN
1.0000 mg | Freq: Once | INTRAMUSCULAR | Status: AC
  Administered 2022-08-17: 1 mg via INTRAVENOUS
  Filled 2022-08-17: qty 1

## 2022-08-17 MED ORDER — FOLIC ACID 1 MG PO TABS: 1.0000 mg | ORAL_TABLET | Freq: Every day | ORAL | Status: DC

## 2022-08-17 MED ORDER — FUROSEMIDE 10 MG/ML IJ SOLN: 40.0000 mg | Freq: Two times a day (BID) | INTRAMUSCULAR | Status: DC

## 2022-08-17 MED ORDER — POLYVINYL ALCOHOL 1.4 % OP SOLN: 1.0000 [drp] | Freq: Four times a day (QID) | OPHTHALMIC | Status: DC | PRN

## 2022-08-17 MED ORDER — MORPHINE BOLUS VIA INFUSION: 5.0000 mg | INTRAVENOUS | Status: DC | PRN

## 2022-08-17 MED ORDER — ROCURONIUM BROMIDE 10 MG/ML (PF) SYRINGE
PREFILLED_SYRINGE | INTRAVENOUS | Status: AC
  Filled 2022-08-17: qty 10

## 2022-08-17 MED ORDER — ACETAMINOPHEN 650 MG RE SUPP: 650.0000 mg | Freq: Four times a day (QID) | RECTAL | Status: DC | PRN

## 2022-08-17 MED ORDER — DILTIAZEM HCL 25 MG/5ML IV SOLN
15.0000 mg | Freq: Once | INTRAVENOUS | Status: AC
  Administered 2022-08-17: 15 mg via INTRAVENOUS
  Filled 2022-08-17: qty 5

## 2022-08-17 MED ORDER — METOPROLOL TARTRATE 5 MG/5ML IV SOLN
5.0000 mg | Freq: Three times a day (TID) | INTRAVENOUS | Status: DC
  Administered 2022-08-17: 5 mg via INTRAVENOUS
  Filled 2022-08-17: qty 5

## 2022-08-17 MED ORDER — GLYCOPYRROLATE 0.2 MG/ML IJ SOLN: 0.2000 mg | INTRAMUSCULAR | Status: DC | PRN

## 2022-08-17 MED ORDER — FENTANYL CITRATE (PF) 100 MCG/2ML IJ SOLN
INTRAMUSCULAR | Status: AC
  Filled 2022-08-17: qty 2

## 2022-08-17 MED ORDER — ETOMIDATE 2 MG/ML IV SOLN
INTRAVENOUS | Status: AC
  Filled 2022-08-17: qty 20

## 2022-08-17 MED ORDER — ACETAMINOPHEN 325 MG PO TABS: 650.0000 mg | ORAL_TABLET | Freq: Four times a day (QID) | ORAL | Status: DC | PRN

## 2022-08-17 MED ORDER — GLYCOPYRROLATE 1 MG PO TABS: 1.0000 mg | ORAL_TABLET | ORAL | Status: DC | PRN

## 2022-08-17 MED ORDER — PHENYLEPHRINE 80 MCG/ML (10ML) SYRINGE FOR IV PUSH (FOR BLOOD PRESSURE SUPPORT)
PREFILLED_SYRINGE | INTRAVENOUS | Status: AC
  Filled 2022-08-17: qty 10

## 2022-08-18 LAB — LEGIONELLA PNEUMOPHILA SEROGP 1 UR AG: L. pneumophila Serogp 1 Ur Ag: NEGATIVE

## 2022-08-22 ENCOUNTER — Ambulatory Visit: Payer: Medicare PPO | Admitting: Internal Medicine

## 2022-09-14 NOTE — Progress Notes (Signed)
PROGRESS NOTE    Jeffrey Meadows  ZDG:[id number] DOB: 04-Apr-1933 DOA: 07/24/2022 PCP: [name], MD    Chief Complaint  Patient presents with   Emesis   Urinary Retention    Brief Narrative:  Jeffrey Meadows is an 89 y.o. male past medical history significant for hypertension, COPD alcohol abuse comes in with hematemesis and coffee-ground emesis, he relates NSAID use (meloxicam) also drinks daily.  Patient seen by GI, underwent upper endoscopy noted multiple duodenal ulcers as well as large ulcers as well as LA grade D esophagitis and gastritis.  Patient maintained on IV PPI/PPI drip.  Patient noted to transiently go into A-fib on the night of 08/31/2022 noted to go into probable alcohol withdrawal.  Patient also noted with some hypoxia, chest x-ray done concerning for pneumonia.  Patient noted initially to go back into sinus rhythm, Cardizem drip discontinued, patient noted to go back into A-fib with RVR overnight, received a dose of Cardizem.  The morning of 09-11-22 patient noted to go into acute respiratory distress, became unresponsive, noted to be bagged, PCCM consulted for further evaluation.   Assessment & Plan:   Principal Problem:   Acute GI bleeding Active Problems:   Multiple duodenal ulcers   Acute respiratory failure with hypoxia (HCC)   Esophagitis, [place] grade D   COPD with chronic bronchitis and emphysema (HCC)   BPH with obstruction/lower urinary tract symptoms   AKI (acute kidney injury) (HCC)   Hypercalcemia   Leukocytosis   Thrombocytosis   Alcohol use   Acute upper GI bleed   Aspiration pneumonia (HCC)   CAP (community acquired pneumonia)   Acute diastolic CHF (congestive heart failure) ([place])  #1 acute upper GI bleed secondary to duodenal ulcers/LA grade D esophagitis -Patient had presented with hematemesis, history of alcohol use and NSAID use. -Patient seen in consultation by GI underwent upper endoscopy that showed multiple duodenal ulcers  with large oozing duodenal ulcer and multiple duodenal ulcers placed on the Protonix drip. -Patient with no further hematemesis. -Hemoglobin currently stable at 10.6 -Continue n.p.o. until patient more alert. -GI following and appreciate input and recommendations.  2.  Acute hypoxic respiratory failure/acute metabolic encephalopathy -Patient noted to go into acute hypoxic respiratory failure this morning, unresponsive, patient noted to desat into the 50s and was being bagged this morning. -ABG obtained with a pH of 7.17/pCO2 of 92/pO2 of 63/bicarb of 34. -bnp obtained this morning at 821.3. -Chest x-ray obtained this morning with vascular congestion superimposed on emphysema. -Lasix 40 mg IV x 1 ordered. -PCCM consulted, assessed patient, spoke with wife and decision made to make patient DNR, no intubation and once wife gets here and sister gets here from [place] will likely transition to full comfort measures.  3.  Transient A-fib -Likely secondary to upper GI bleed and alcohol withdrawal. -Patient back in normal sinus rhythm on 08/16/2022, however went back into A-fib overnight. -Cardiac enzymes elevated but flattened.  TSH within normal limits. -2D echo with EF of 55 to 95%, grade 1 diastolic dysfunction, small pericardial effusion present. -Patient received IV Cardizem overnight as patient went back into A-fib. -Unable to anticoagulate due to upper GI bleed.  4  Alcohol dependence -Patient with significant alcohol use per wife. - noted to go into alcohol withdrawal,, sedated with Ativan. -Folic acid, multivitamin, thiamine. -Continue Ativan CIWA protocol.  5.  Aspiration pneumonia versus CAP -Patient noted to be hypoxic overnight currently on a Venturi mask. -Chest x-ray done on 08/16/2022, with new  worsening patchy airspace disease in the lingula suspicious for developing infection in the correct clinical setting. -Patient presented with nausea hematemesis concern for aspiration  pneumonia. -Urine strep pneumococcus antigen negative.   -Urine Legionella antigen pending.   -Continue empiric IV Unasyn, IV azithromycin, scheduled Xopenex and Atrovent nebs.    6.  AKI -Foley catheter placed, renal function improved with hydration. -Saline lock IV fluids.  7.  Urinary retention./BPH -Foley catheter placed, voiding trial in 5 days. -Flomax.  8.  Acute exacerbation of COPD -Patient noted to go into respiratory distress this morning, ABG with a pH of 7.1, pCO2 of 92, pO2 of 63,..   -Continue scheduled Xopenex, Atrovent nebs.   -Pulmicort.  IV Solu-Medrol.  -PCCM consulted.  9.  Leukocytosis -Initial concern for reactive due to GI bleed in the setting of alcohol withdrawal. -Patient noted to be hypoxic, requiring Venturi mask, chest x-ray done concerning for possible pneumonia. -Urine strep pneumococcus antigen negative.  Urine Legionella antigen pending.  -Continue empiric IV Unasyn, IV azithromycin due to concerns for pneumonia.   10.  Hypercalcemia -Secondary to dehydration. -Improved with IVF -Saline lock IV fluids.   DVT prophylaxis: SCDs Code Status: Full Family Communication: Updated wife at bedside. Disposition: TBD  Status is: Inpatient Remains inpatient appropriate because: Severity of illness   Consultants:  Gastroenterology: Dr. Paulita Fujita 08/14/2022 PCCM: Dr. Silas Flood 09/11/22  Procedures:  Chest x-ray 08/16/2022, 08/02/2022 2D echo pending 08/16/2022 Upper endoscopy 08/22/2022 per GI, Dr. Michail Sermon   Antimicrobials:  IV Unasyn 08/16/2022>>>>> IV azithromycin 08/16/2022 >>>>>   Subjective: Patient noted to go into acute respiratory distress this morning, sats dropping into the 76s per RN, patient being bagged.  Patient also unresponsive/lethargic.  Patient noted to go back into A-fib overnight received Cardizem.    Objective: Vitals:   11-Sep-2022 0400 09/11/2022 0500 09/11/22 0600 09/11/2022 0812  BP: (!) 139/48 (!) 142/66 (!) 166/91   Pulse: 90 100  (!) 104   Resp: '15 17 19   '$ Temp: 97.6 F (36.4 C)     TempSrc: Axillary     SpO2: 100% 100% 100% 100%  Weight:      Height:        Intake/Output Summary (Last 24 hours) at September 11, 2022 0857 Last data filed at Sep 11, 2022 0536 Gross per 24 hour  Intake 991.41 ml  Output 600 ml  Net 391.41 ml   Filed Weights   07/28/2022 1620 09/01/2022 0712  Weight: 54 kg 54 kg    Examination:  General exam: Patient being bagged.  Unresponsive. Respiratory system: Coarse breath sounds anterior lung fields.  No wheezing.  Patient being bagged.  Cardiovascular system: Irregularly irregular.  No JVD, no murmurs rubs or gallops.  Trace bilateral lower extremity edema.  Gastrointestinal system: Abdomen is nondistended, soft and nontender to palpation.  Positive bowel sounds.  No rebound.  No guarding.  Central nervous system: Being bagged.  Unresponsive.  Extremities: Symmetric 5 x 5 power. Skin: No rashes, lesions or ulcers Psychiatry: Judgement and insight unable to assess.  Mood and affect unable to assess.     Data Reviewed: I have personally reviewed following labs and imaging studies  CBC: Recent Labs  Lab 07/24/2022 1646 08/14/22 0535 08/31/2022 0442 08/16/22 0249 09-11-2022 0246  WBC 17.9* 17.4* 16.1* 10.2 10.5  NEUTROABS  --   --   --   --  7.9*  HGB 13.4 13.3 11.6* 11.3* 10.6*  HCT 40.2 40.4 36.1* 35.7* 33.4*  MCV 99.5 100.0 102.8* 104.1* 105.4*  PLT  445* 427* 354 308 361    Basic Metabolic Panel: Recent Labs  Lab 07/23/2022 1646 08/14/22 0535 08/14/22 0805 08/16/22 0249 08/16/22 1024 2022/08/21 0246  NA 139 137  --  141  --  144  K 4.5 3.8  --  3.6  --  3.5  CL 94* 97*  --  106  --  111  CO2 33* 31  --  27  --  26  GLUCOSE 136* 102*  --  94  --  82  BUN 32* 35*  --  36*  --  27*  CREATININE 1.30* 1.12  --  0.83  --  0.66  CALCIUM 11.0* 10.2  --  8.9  --  8.3*  MG  --   --  2.0  --  2.2 2.1    GFR: Estimated Creatinine Clearance: 49.7 mL/min (by C-G formula based on SCr of  0.66 mg/dL).  Liver Function Tests: Recent Labs  Lab 07/19/2022 1646 08/16/22 0249  AST 25 16  ALT 15 13  ALKPHOS 69 45  BILITOT 1.0 1.0  PROT 7.1 5.2*  ALBUMIN 3.6 2.7*    CBG: No results for input(s): "GLUCAP" in the last 168 hours.   Recent Results (from the past 240 hour(s))  MRSA Next Gen by PCR, Nasal     Status: None   Collection Time: 09/13/2022  2:36 PM   Specimen: Nasal Mucosa; Nasal Swab  Result Value Ref Range Status   MRSA by PCR Next Gen NOT DETECTED NOT DETECTED Final    Comment: (NOTE) The GeneXpert MRSA Assay (FDA approved for NASAL specimens only), is one component of a comprehensive MRSA colonization surveillance program. It is not intended to diagnose MRSA infection nor to guide or monitor treatment for MRSA infections. Test performance is not FDA approved in patients less than 37 years old. Performed at [facility], [place] [address], [place], [place] [address]          Radiology Studies: DG CHEST PORT 1 VIEW  Result Date: 08-21-2022 CLINICAL DATA:  Hypoxia, hypertension, tobacco abuse EXAM: PORTABLE CHEST 1 VIEW COMPARISON:  08/16/2022 FINDINGS: Supine frontal view of the chest was obtained, excluding portions of the left costophrenic angle by collimation. Defibrillator pad overlies the cardiac silhouette. The heart is not enlarged. No acute airspace disease, effusion, or pneumothorax. Mild central vascular congestion. Background emphysema again noted. No acute bony abnormality. IMPRESSION: 1. Central vascular congestion superimposed upon background emphysema. Electronically Signed   By: Randa Ngo M.D.   On: August 21, 2022 08:50   CT HEAD WO CONTRAST (5MM)  Result Date: 08/16/2022 CLINICAL DATA:  Altered mental status EXAM: CT HEAD WITHOUT CONTRAST TECHNIQUE: Contiguous axial images were obtained from the base of the skull through the vertex without intravenous contrast. RADIATION DOSE REDUCTION: This exam was performed according to the  departmental dose-optimization program which includes automated exposure control, adjustment of the mA and/or kV according to patient size and/or use of iterative reconstruction technique. COMPARISON:  05/18/2022 FINDINGS: Brain: No evidence of acute infarction, hemorrhage, mass, mass effect, or midline shift. No hydrocephalus or extra-axial fluid collection. Moderate to severe age related cerebral atrophy. Periventricular white matter changes, likely the sequela of chronic small vessel ischemic disease. Vascular: No hyperdense vessel. Atherosclerotic calcifications in the intracranial carotid and vertebral arteries. Skull: Normal. Negative for fracture or focal lesion. Sinuses/Orbits: Small mucous retention cyst in the left maxillary sinus. Mild mucosal thickening in the frontal sinuses. Status post bilateral lens replacements. Other: The mastoid air  cells are well aerated. IMPRESSION: No acute intracranial process. No etiology is seen for the patient's altered mental status. Electronically Signed   By: Merilyn Baba M.D.   On: 08/16/2022 19:01   ECHOCARDIOGRAM COMPLETE  Result Date: 08/16/2022    ECHOCARDIOGRAM REPORT   Patient Name:   KORTNEY SCHOENFELDER Delorenzo Date of Exam: 08/16/2022 Medical Rec #:  [id number]       Height:       65.0 in Accession #:    [id number]      Weight:       119.0 lb Date of Birth:  [birth year]       BSA:          1.587 m Patient Age:    69 years        BP:           162/67 mmHg Patient Gender: M               HR:           70 bpm. Exam Location:  Inpatient Procedure: 2D Echo Indications:    atrial fibrillation  History:        Patient has prior history of Echocardiogram examinations, most                 recent 07/16/2020.  Sonographer:    Harvie Junior Referring Phys: [phone] Kioni Stahl V Zyliah Schier  Sonographer Comments: Technically difficult study due to poor echo windows and echo performed with patient supine and on artificial respirator. Image acquisition challenging due to patient behavioral factors.,  Image acquisition challenging due to uncooperative patient and Image acquisition challenging due to respiratory motion. IMPRESSIONS  1. Left ventricular ejection fraction, by estimation, is 55 to 60%. The left ventricle has normal function. Left ventricular endocardial border not optimally defined to evaluate regional wall motion. Left ventricular diastolic parameters are consistent with Grade I diastolic dysfunction (impaired relaxation).  2. Right ventricular systolic function is normal. The right ventricular size is normal.  3. A small pericardial effusion is present. The pericardial effusion is anterior to the right ventricle.  4. The mitral valve is degenerative. No evidence of mitral valve regurgitation.  5. The aortic valve was not well visualized. Aortic valve regurgitation is not visualized.  6. The inferior vena cava is normal in size with greater than 50% respiratory variability, suggesting right atrial pressure of 3 mmHg. Comparison(s): Similar to prior; this study is more technically challenging. FINDINGS  Left Ventricle: Left ventricular ejection fraction, by estimation, is 55 to 60%. The left ventricle has normal function. Left ventricular endocardial border not optimally defined to evaluate regional wall motion. The left ventricular internal cavity size was small. There is no left ventricular hypertrophy. Left ventricular diastolic parameters are consistent with Grade I diastolic dysfunction (impaired relaxation). Right Ventricle: The right ventricular size is normal. Right vetricular wall thickness was not well visualized. Right ventricular systolic function is normal. Left Atrium: Left atrial size was normal in size. Right Atrium: Right atrial size was normal in size. Pericardium: A small pericardial effusion is present. The pericardial effusion is anterior to the right ventricle. Mitral Valve: The mitral valve is degenerative in appearance. No evidence of mitral valve regurgitation. Tricuspid  Valve: The tricuspid valve is not well visualized. Tricuspid valve regurgitation is not demonstrated. Aortic Valve: The aortic valve was not well visualized. Aortic valve regurgitation is not visualized. Aortic valve mean gradient measures 4.0 mmHg. Aortic valve peak gradient measures 6.5 mmHg. Aortic valve area, by  VTI measures 2.28 cm. Pulmonic Valve: The pulmonic valve was not well visualized. Pulmonic valve regurgitation is not visualized. Aorta: The aortic arch was not well visualized, the ascending aorta was not well visualized and the aortic root was not well visualized. Venous: The inferior vena cava is normal in size with greater than 50% respiratory variability, suggesting right atrial pressure of 3 mmHg. IAS/Shunts: No atrial level shunt detected by color flow Doppler.  LEFT VENTRICLE PLAX 2D LVIDd:         3.75 cm     Diastology LVIDs:         3.10 cm     LV e' medial:    8.38 cm/s LV PW:         0.95 cm     LV E/e' medial:  14.1 LV IVS:        1.10 cm     LV e' lateral:   10.60 cm/s LVOT diam:     2.20 cm     LV E/e' lateral: 11.1 LV SV:         52 LV SV Index:   33 LVOT Area:     3.80 cm  LV Volumes (MOD) LV vol d, MOD A2C: 83.8 ml LV vol d, MOD A4C: 95.3 ml LV vol s, MOD A2C: 30.5 ml LV vol s, MOD A4C: 46.8 ml LV SV MOD A2C:     53.3 ml LV SV MOD A4C:     95.3 ml LV SV MOD BP:      53.1 ml RIGHT VENTRICLE RV Basal diam:  4.30 cm RV Mid diam:    3.70 cm RV S prime:     18.70 cm/s TAPSE (M-mode): 2.3 cm LEFT ATRIUM           Index        RIGHT ATRIUM           Index LA diam:      2.90 cm 1.83 cm/m   RA Area:     12.90 cm LA Vol (A4C): 42.5 ml 26.78 ml/m  RA Volume:   32.60 ml  20.55 ml/m  AORTIC VALVE                    PULMONIC VALVE AV Area (Vmax):    2.24 cm     PV Vmax:       1.06 m/s AV Area (Vmean):   2.25 cm     PV Peak grad:  4.5 mmHg AV Area (VTI):     2.28 cm AV Vmax:           127.00 cm/s AV Vmean:          92.600 cm/s AV VTI:            0.227 m AV Peak Grad:      6.5 mmHg AV Mean  Grad:      4.0 mmHg LVOT Vmax:         74.80 cm/s LVOT Vmean:        54.900 cm/s LVOT VTI:          0.136 m LVOT/AV VTI ratio: 0.60  AORTA Ao Root diam: 3.80 cm Ao Asc diam:  3.80 cm MITRAL VALVE                TRICUSPID VALVE MV Area (PHT): 5.02 cm     TR Peak grad:   35.0 mmHg MV Decel Time: 151 msec     TR Vmax:  296.00 cm/s MV E velocity: 118.00 cm/s MV A velocity: 118.00 cm/s  SHUNTS MV E/A ratio:  1.00         Systemic VTI:  0.14 m                             Systemic Diam: 2.20 cm Rudean Haskell MD Electronically signed by Rudean Haskell MD Signature Date/Time: 08/16/2022/3:13:26 PM    Final    DG CHEST PORT 1 VIEW  Result Date: 08/16/2022 CLINICAL DATA:  Hypoxia EXAM: PORTABLE CHEST 1 VIEW COMPARISON:  Chest radiograph 07/24/2022 FINDINGS: The cardiomediastinal silhouette is stable and within normal limits. There is new/worsening patchy airspace disease in the lingula. Emphysematous changes are stable. There is no pleural effusion or pneumothorax There is no acute osseous abnormality. IMPRESSION: New/worsening patchy airspace disease in the lingula suspicious for developing infection in the correct clinical setting. Electronically Signed   By: Valetta Mole M.D.   On: 08/16/2022 08:41        Scheduled Meds:  budesonide (PULMICORT) nebulizer solution  0.5 mg Nebulization BID   Chlorhexidine Gluconate Cloth  6 each Topical Daily   etomidate       fentaNYL       finasteride  5 mg Oral Daily   folic acid  1 mg Oral Daily   furosemide  40 mg Intravenous Q12H   ipratropium  0.5 mg Nebulization TID   levalbuterol  0.63 mg Nebulization TID   methylPREDNISolone (SOLU-MEDROL) injection  80 mg Intravenous Q8H   metoprolol tartrate  5 mg Intravenous Q8H   midazolam       morphine (PF)       multivitamin with minerals  1 tablet Oral Daily   [START ON 08/18/2022] pantoprazole  40 mg Intravenous Q12H   phenylephrine       polycarbophil  625 mg Oral Daily   rocuronium bromide        sodium bicarbonate       thiamine  100 mg Oral Daily   Or   thiamine  100 mg Intravenous Daily   Continuous Infusions:  ampicillin-sulbactam (UNASYN) IV Stopped (2022/09/06 0536)   azithromycin Stopped (08/16/22 1152)   pantoprazole Stopped (08/16/22 0320)   potassium chloride       LOS: 3 days    Time spent: 50 minutes    Irine Seal, MD [facility]   To contact the attending provider between 7A-7P or the covering provider during after hours 7P-7A, please log into the web site [email or web address] and access using universal [place] password for that web site. If you do not have the password, please call the hospital operator.  09/06/2022, 8:57 AM

## 2022-09-14 NOTE — Assessment & Plan Note (Signed)
-  Noted on upper endoscopy. -Patient maintained on the Protonix drip. -Patient's condition deteriorated, seen by critical care who discussed with family and decision made to transition to full comfort measures. -Patient kept comfortable and subsequently died at 0945 hrs. on August 30, 2022.

## 2022-09-14 NOTE — Progress Notes (Signed)
RT NOTE:  BiPAP attempted to be placed on pt per MD order but pt unable to tolerate. RT spoke with CCM-NP Ollis and was instructed to take pt off BiPAP and place on NRB at this time.

## 2022-09-14 NOTE — Assessment & Plan Note (Signed)
Patient noted to go into acute hypoxic respiratory failure the morning of 08/18/22,, unresponsive, patient noted to desat into the 50s and was being bagged. -ABG obtained with a pH of 7.17/pCO2 of 92/pO2 of 63/bicarb of 34. -bnp obtained this morning at 821.3. -Chest x-ray obtained with vascular congestion superimposed on emphysema. -Lasix 40 mg IV x 1 ordered. -PCCM consulted, assessed patient, spoke with wife and decision made to make patient DNR, no intubation and and patient made comfortable with full comfort measures.  Patient subsequently died at 17 hrs. on 2022-08-18.  Wife at bedside.

## 2022-09-14 NOTE — Assessment & Plan Note (Signed)
-  Noted on upper endoscopy. -See problem #1 acute GI bleed above.

## 2022-09-14 NOTE — Assessment & Plan Note (Signed)
-  See above acute respiratory failure with hypoxia

## 2022-09-14 NOTE — Death Summary Note (Signed)
DEATH SUMMARY   Patient Details  Name: Jeffrey Meadows MRN: [id number] DOB: 26-Jun-1933 HMC:[id number], Cresenciano Lick, MD Admission/Discharge Information   Admit Date:  Sep 11, 2022  Date of Death: Date of Death: Sep 15, 2022  Time of Death: Time of Death: 0945  Length of Stay: 2   Principle Cause of death: Acute hypercarbic respiratory failure  Hospital Diagnoses: Principal Problem:   Acute GI bleeding Active Problems:   Multiple duodenal ulcers   Acute respiratory failure with hypoxia (HCC)   Esophagitis, [place] grade D   COPD with chronic bronchitis and emphysema (HCC)   BPH with obstruction/lower urinary tract symptoms   AKI (acute kidney injury) ([place])   Hypercalcemia   Leukocytosis   Thrombocytosis   Alcohol use   Acute upper GI bleed   Aspiration pneumonia ([place])   CAP (community acquired pneumonia)   Acute diastolic CHF (congestive heart failure) [facility])   Hospital Course: JAHLEN BOLLMAN is an 89 y.o. male past medical history significant for hypertension, COPD alcohol abuse comes in with hematemesis and coffee-ground emesis, he relates NSAID use (meloxicam) also drinks daily.  Patient seen by GI, underwent upper endoscopy noted multiple duodenal ulcers as well as large ulcers as well as LA grade D esophagitis and gastritis.  Patient maintained on IV PPI/PPI drip.  Patient noted to transiently go into A-fib on the night of 09/08/2022 noted to go into probable alcohol withdrawal.  Patient also noted with some hypoxia, chest x-ray done concerning for pneumonia.  Patient noted initially to go back into sinus rhythm, Cardizem drip discontinued, patient noted to go back into A-fib with RVR overnight, received a dose of Cardizem.  The morning of 15-Sep-2022 patient noted to go into acute respiratory distress, became unresponsive, noted to be bagged, PCCM consulted for further evaluation   Assessment and Plan: * Acute GI bleeding -Patient had presented with hematemesis, history of alcohol use  and NSAID use. -Patient seen in consultation by GI underwent upper endoscopy that showed multiple duodenal ulcers with large oozing duodenal ulcer and multiple duodenal ulcers placed on the Protonix drip. -Patient with no further hematemesis. -Hemoglobin stabilized at 10.6 -Patient kept n.p.o. due to lethargy. -Patient's condition deteriorated, was seen by PCCM, patient noted to go into acute hypoxic respiratory failure, metabolic encephalopathy, PCCM discussed with family decision was made to transition to full comfort measures. -Patient was kept comfortable and subsequently died at 0945 hrs. on 09/15/22.  Acute respiratory failure with hypoxia ([place]) Patient noted to go into acute hypoxic respiratory failure the morning of 2022/09/15,, unresponsive, patient noted to desat into the 50s and was being bagged. -ABG obtained with a pH of 7.17/pCO2 of 92/pO2 of 63/bicarb of 34. -bnp obtained this morning at 821.3. -Chest x-ray obtained with vascular congestion superimposed on emphysema. -Lasix 40 mg IV x 1 ordered. -PCCM consulted, assessed patient, spoke with wife and decision made to make patient DNR, no intubation and and patient made comfortable with full comfort measures.  Patient subsequently died at 35 hrs. on Sep 15, 2022.  Wife at bedside.   Multiple duodenal ulcers -Noted on upper endoscopy. -Patient maintained on the Protonix drip. -Patient's condition deteriorated, seen by critical care who discussed with family and decision made to transition to full comfort measures. -Patient kept comfortable and subsequently died at 0945 hrs. on 09-15-2022.  Esophagitis, [place] grade D -Noted on upper endoscopy. -See problem #1 acute GI bleed above.  Acute diastolic CHF (congestive heart failure) ([place]) -See above acute respiratory failure with hypoxia  Acute upper GI bleed See problem #1 above.  Alcohol use -pt reports at least 3 glass of wine with Brandy nightly. Last drink the night prior  to admission.   -Patient placed on Ativan withdrawal protocol, patient was noted to go into alcohol withdrawal during the hospitalization and sedated with Ativan.  Patient maintained on folic acid, multivitamin, thiamine.   -Patient's condition deteriorated, patient went into acute hypoxic respiratory failure, seen in consultation by PCCM, who discussed with family and decision made to transition to full comfort measures.    Thrombocytosis -likely reactive.  Leukocytosis -Initially felt reactive secondary to GI bleed in the setting of alcohol withdrawal.  -Acute hospitalization patient noted to be hypoxic requiring Venturi max, chest x-ray done with concern for possible aspiration pneumonia.   -Urine strep pneumococcus antigen done was negative.  Urine Legionella antigen done pending.   -Patient placed empirically on IV Unasyn and IV azithromycin due to concerns for aspiration pneumonia.   -Patient's condition deteriorated and worsened, patient went into acute hypoxic respiratory failure, seen in consultation by PCCM who discussed with family and decision made to transition to full comfort measures.   Hypercalcemia Ca of 11 on admission, felt secondary to dehydration. -Patient hydrated with IV fluids with improvement with hypercalcemia.  AKI (acute kidney injury) ([place]) -creatinine of 1.30 up from 0.85.  Likely secondary to prerenal azotemia in the setting of possible urinary retention.   -Patient hydrated with IV fluids, Foley catheter placed with improvement with renal function.   -Patient's condition deteriorated, PCCM discussed with family and decision made to transition to full comfort measures.  Patient subsequently died at 19 hrs. on 2022-08-30.   BPH with obstruction/lower urinary tract symptoms -Foley catheter was placed during the hospitalization and patient placed on Flomax.   COPD with chronic bronchitis and emphysema ([place]) -Patient noted to go into respiratory distress the  morning of 08-30-2022, ABG done noted a pH of 7.1, pCO2 of 92, pO2 of 63.   -Patient placed on scheduled Xopenex, Atrovent nebs, Pulmicort IV Solu-Medrol given.  PCCM consulted who assessed patient and discussed with family and decision was made to transition to full comfort measures.   -Patient kept comfortable and patient subsequently died at 0945 hrs. on 08-30-22.           Procedures: Chest x-ray 08/16/2022, 08/05/2022 2D echo pending 08/16/2022 Upper endoscopy 09/13/2022 per GI, Dr. Michail Sermon  Consultations: Gastroenterology: Dr. Paulita Fujita 08/14/2022 PCCM: Dr. Silas Flood 2022/08/30    The results of significant diagnostics from this hospitalization (including imaging, microbiology, ancillary and laboratory) are listed below for reference.   Significant Diagnostic Studies: DG CHEST PORT 1 VIEW  Result Date: Aug 30, 2022 CLINICAL DATA:  Hypoxia, hypertension, tobacco abuse EXAM: PORTABLE CHEST 1 VIEW COMPARISON:  08/16/2022 FINDINGS: Supine frontal view of the chest was obtained, excluding portions of the left costophrenic angle by collimation. Defibrillator pad overlies the cardiac silhouette. The heart is not enlarged. No acute airspace disease, effusion, or pneumothorax. Mild central vascular congestion. Background emphysema again noted. No acute bony abnormality. IMPRESSION: 1. Central vascular congestion superimposed upon background emphysema. Electronically Signed   By: Randa Ngo M.D.   On: 08-30-2022 08:50   CT HEAD WO CONTRAST (5MM)  Result Date: 08/16/2022 CLINICAL DATA:  Altered mental status EXAM: CT HEAD WITHOUT CONTRAST TECHNIQUE: Contiguous axial images were obtained from the base of the skull through the vertex without intravenous contrast. RADIATION DOSE REDUCTION: This exam was performed according to the departmental dose-optimization program which includes automated exposure  control, adjustment of the mA and/or kV according to patient size and/or use of iterative reconstruction  technique. COMPARISON:  05/18/2022 FINDINGS: Brain: No evidence of acute infarction, hemorrhage, mass, mass effect, or midline shift. No hydrocephalus or extra-axial fluid collection. Moderate to severe age related cerebral atrophy. Periventricular white matter changes, likely the sequela of chronic small vessel ischemic disease. Vascular: No hyperdense vessel. Atherosclerotic calcifications in the intracranial carotid and vertebral arteries. Skull: Normal. Negative for fracture or focal lesion. Sinuses/Orbits: Small mucous retention cyst in the left maxillary sinus. Mild mucosal thickening in the frontal sinuses. Status post bilateral lens replacements. Other: The mastoid air cells are well aerated. IMPRESSION: No acute intracranial process. No etiology is seen for the patient's altered mental status. Electronically Signed   By: Merilyn Baba M.D.   On: 08/16/2022 19:01   ECHOCARDIOGRAM COMPLETE  Result Date: 08/16/2022    ECHOCARDIOGRAM REPORT   Patient Name:   LOC FEINSTEIN Laitinen Date of Exam: 08/16/2022 Medical Rec #:  [id number]       Height:       65.0 in Accession #:    [id number]      Weight:       119.0 lb Date of Birth:  03/05/1933       BSA:          1.587 m Patient Age:    48 years        BP:           162/67 mmHg Patient Gender: M               HR:           70 bpm. Exam Location:  Inpatient Procedure: 2D Echo Indications:    atrial fibrillation  History:        Patient has prior history of Echocardiogram examinations, most                 recent 07/16/2020.  Sonographer:    Harvie Junior Referring Phys: [phone] Akim Watkinson V Lela Murfin  Sonographer Comments: Technically difficult study due to poor echo windows and echo performed with patient supine and on artificial respirator. Image acquisition challenging due to patient behavioral factors., Image acquisition challenging due to uncooperative patient and Image acquisition challenging due to respiratory motion. IMPRESSIONS  1. Left ventricular ejection fraction, by  estimation, is 55 to 60%. The left ventricle has normal function. Left ventricular endocardial border not optimally defined to evaluate regional wall motion. Left ventricular diastolic parameters are consistent with Grade I diastolic dysfunction (impaired relaxation).  2. Right ventricular systolic function is normal. The right ventricular size is normal.  3. A small pericardial effusion is present. The pericardial effusion is anterior to the right ventricle.  4. The mitral valve is degenerative. No evidence of mitral valve regurgitation.  5. The aortic valve was not well visualized. Aortic valve regurgitation is not visualized.  6. The inferior vena cava is normal in size with greater than 50% respiratory variability, suggesting right atrial pressure of 3 mmHg. Comparison(s): Similar to prior; this study is more technically challenging. FINDINGS  Left Ventricle: Left ventricular ejection fraction, by estimation, is 55 to 60%. The left ventricle has normal function. Left ventricular endocardial border not optimally defined to evaluate regional wall motion. The left ventricular internal cavity size was small. There is no left ventricular hypertrophy. Left ventricular diastolic parameters are consistent with Grade I diastolic dysfunction (impaired relaxation). Right Ventricle: The right ventricular size is normal. Right vetricular wall thickness  was not well visualized. Right ventricular systolic function is normal. Left Atrium: Left atrial size was normal in size. Right Atrium: Right atrial size was normal in size. Pericardium: A small pericardial effusion is present. The pericardial effusion is anterior to the right ventricle. Mitral Valve: The mitral valve is degenerative in appearance. No evidence of mitral valve regurgitation. Tricuspid Valve: The tricuspid valve is not well visualized. Tricuspid valve regurgitation is not demonstrated. Aortic Valve: The aortic valve was not well visualized. Aortic valve  regurgitation is not visualized. Aortic valve mean gradient measures 4.0 mmHg. Aortic valve peak gradient measures 6.5 mmHg. Aortic valve area, by VTI measures 2.28 cm. Pulmonic Valve: The pulmonic valve was not well visualized. Pulmonic valve regurgitation is not visualized. Aorta: The aortic arch was not well visualized, the ascending aorta was not well visualized and the aortic root was not well visualized. Venous: The inferior vena cava is normal in size with greater than 50% respiratory variability, suggesting right atrial pressure of 3 mmHg. IAS/Shunts: No atrial level shunt detected by color flow Doppler.  LEFT VENTRICLE PLAX 2D LVIDd:         3.75 cm     Diastology LVIDs:         3.10 cm     LV e' medial:    8.38 cm/s LV PW:         0.95 cm     LV E/e' medial:  14.1 LV IVS:        1.10 cm     LV e' lateral:   10.60 cm/s LVOT diam:     2.20 cm     LV E/e' lateral: 11.1 LV SV:         52 LV SV Index:   33 LVOT Area:     3.80 cm  LV Volumes (MOD) LV vol d, MOD A2C: 83.8 ml LV vol d, MOD A4C: 95.3 ml LV vol s, MOD A2C: 30.5 ml LV vol s, MOD A4C: 46.8 ml LV SV MOD A2C:     53.3 ml LV SV MOD A4C:     95.3 ml LV SV MOD BP:      53.1 ml RIGHT VENTRICLE RV Basal diam:  4.30 cm RV Mid diam:    3.70 cm RV S prime:     18.70 cm/s TAPSE (M-mode): 2.3 cm LEFT ATRIUM           Index        RIGHT ATRIUM           Index LA diam:      2.90 cm 1.83 cm/m   RA Area:     12.90 cm LA Vol (A4C): 42.5 ml 26.78 ml/m  RA Volume:   32.60 ml  20.55 ml/m  AORTIC VALVE                    PULMONIC VALVE AV Area (Vmax):    2.24 cm     PV Vmax:       1.06 m/s AV Area (Vmean):   2.25 cm     PV Peak grad:  4.5 mmHg AV Area (VTI):     2.28 cm AV Vmax:           127.00 cm/s AV Vmean:          92.600 cm/s AV VTI:            0.227 m AV Peak Grad:      6.5 mmHg AV Mean Grad:  4.0 mmHg LVOT Vmax:         74.80 cm/s LVOT Vmean:        54.900 cm/s LVOT VTI:          0.136 m LVOT/AV VTI ratio: 0.60  AORTA Ao Root diam: 3.80 cm Ao Asc diam:   3.80 cm MITRAL VALVE                TRICUSPID VALVE MV Area (PHT): 5.02 cm     TR Peak grad:   35.0 mmHg MV Decel Time: 151 msec     TR Vmax:        296.00 cm/s MV E velocity: 118.00 cm/s MV A velocity: 118.00 cm/s  SHUNTS MV E/A ratio:  1.00         Systemic VTI:  0.14 m                             Systemic Diam: 2.20 cm Rudean Haskell MD Electronically signed by Rudean Haskell MD Signature Date/Time: 08/16/2022/3:13:26 PM    Final    DG CHEST PORT 1 VIEW  Result Date: 08/16/2022 CLINICAL DATA:  Hypoxia EXAM: PORTABLE CHEST 1 VIEW COMPARISON:  Chest radiograph 08/03/2022 FINDINGS: The cardiomediastinal silhouette is stable and within normal limits. There is new/worsening patchy airspace disease in the lingula. Emphysematous changes are stable. There is no pleural effusion or pneumothorax There is no acute osseous abnormality. IMPRESSION: New/worsening patchy airspace disease in the lingula suspicious for developing infection in the correct clinical setting. Electronically Signed   By: Valetta Mole M.D.   On: 08/16/2022 08:41   DG Chest 2 View  Result Date: 07/19/2022 CLINICAL DATA:  Hematemesis EXAM: CHEST - 2 VIEW COMPARISON:  08/03/2022 FINDINGS: Overpenetrated exam. Normal heart size. Aortic atherosclerosis. Severe emphysematous lung changes. No focal airspace consolidation, pleural effusion, or pneumothorax. IMPRESSION: Severe emphysematous lung changes. No acute cardiopulmonary findings. Electronically Signed   By: Davina Poke D.O.   On: 07/21/2022 17:49   DG Ribs Unilateral W/Chest Right  Result Date: 08/03/2022 CLINICAL DATA:  Posterior RIGHT rib pain post falls EXAM: RIGHT RIBS AND CHEST - 3+ VIEW COMPARISON:  Chest radiograph 12/21/2021 FINDINGS: Normal heart size, mediastinal contours, and pulmonary vascularity. Atherosclerotic calcification aorta. Emphysematous changes consistent with COPD. No acute infiltrate, pleural effusion, or pneumothorax. Mild osseous  demineralization. Age-indeterminate fracture of the anterior RIGHT seventh rib. No additional fractures identified. IMPRESSION: Age-indeterminate fracture anterior RIGHT seventh rib. No additional fractures identified. COPD changes without infiltrate. Aortic Atherosclerosis (ICD10-I70.0) and Emphysema (ICD10-J43.9). Electronically Signed   By: Lavonia Dana M.D.   On: 08/03/2022 12:50   DG Lumbar Spine Complete  Result Date: 08/03/2022 CLINICAL DATA:  Multiple falls EXAM: LUMBAR SPINE - COMPLETE 4+ VIEW COMPARISON:  09/08/2020 FINDINGS: Mild-to-moderate levoscoliosis. Multilevel degenerative change with disc space narrowing and spurring throughout the lumbar spine. Approximately 10 mm anterolisthesis L5-S1 unchanged. Chronic compression fracture with cement vertebroplasty L1. This is unchanged. No acute fracture. Atherosclerotic calcification in the aorta. IMPRESSION: 1. Scoliosis and multilevel degenerative change. Chronic compression fracture L1. No acute fracture. 2. No change from the prior study. Electronically Signed   By: Franchot Gallo M.D.   On: 08/03/2022 11:33   DG Hip Unilat W or Wo Pelvis 2-3 Views Right  Result Date: 08/03/2022 CLINICAL DATA:  Recent falls. EXAM: DG HIP (WITH OR WITHOUT PELVIS) 2-3V RIGHT COMPARISON:  Right hip 07/19/2020 FINDINGS: Negative for acute fracture.  Right hip joint  space normal. Extensive atherosclerotic calcification Chronic fracture mid femur appears well-healed. There is a rod in the right femur unchanged in position from the prior study. IMPRESSION: 1. Negative for acute fracture. 2. Chronic fracture mid femur with rod in place. Electronically Signed   By: Franchot Gallo M.D.   On: 08/03/2022 11:31    Microbiology: Recent Results (from the past 240 hour(s))  MRSA Next Gen by PCR, Nasal     Status: None   Collection Time: 09/11/2022  2:36 PM   Specimen: Nasal Mucosa; Nasal Swab  Result Value Ref Range Status   MRSA by PCR Next Gen NOT DETECTED NOT DETECTED  Final    Comment: (NOTE) The GeneXpert MRSA Assay (FDA approved for NASAL specimens only), is one component of a comprehensive MRSA colonization surveillance program. It is not intended to diagnose MRSA infection nor to guide or monitor treatment for MRSA infections. Test performance is not FDA approved in patients less than 58 years old. Performed at [facility], [place] [address], [place], [place] [address]     Time spent: 45 minutes  Signed: Irine Seal, MD 08/22/22

## 2022-09-14 NOTE — Hospital Course (Signed)
Jeffrey Meadows is an 89 y.o. male past medical history significant for hypertension, COPD alcohol abuse comes in with hematemesis and coffee-ground emesis, he relates NSAID use (meloxicam) also drinks daily.  Patient seen by GI, underwent upper endoscopy noted multiple duodenal ulcers as well as large ulcers as well as [place] grade D esophagitis and gastritis.  Patient maintained on IV PPI/PPI drip.  Patient noted to transiently go into A-fib on the night of 09/03/2022 noted to go into probable alcohol withdrawal.  Patient also noted with some hypoxia, chest x-ray done concerning for pneumonia.  Patient noted initially to go back into sinus rhythm, Cardizem drip discontinued, patient noted to go back into A-fib with RVR overnight, received a dose of Cardizem.  The morning of September 04, 2022 patient noted to go into acute respiratory distress, became unresponsive, noted to be bagged, PCCM consulted for further evaluation

## 2022-09-14 NOTE — IPAL (Signed)
  Interdisciplinary Goals of Care Family Meeting   Date carried out: 11-Sep-2022  Location of the meeting: Phone conference  Member's involved: Physician, Bedside Registered Nurse, and Family Member or next of kin  Durable Power of Attorney or acting medical decision maker: Wife, Jeffrey Meadows    Discussion: We discussed goals of care for Jeffrey Meadows .  Worsening respiratory status.  With A-fib RVR overnight.  Chest x-ray appears wet.  Blood gas reveals uncompensated respiratory acidosis pH 7.1.  He is largely obtunded.  Contacted wife via phone.  We discussed his current status.  In discussion about going on life support and resuscitation versus pursuing a more comfort based approach, she stressed on several instances to make sure he is comfortable.  Discussed that pursuing life support resuscitative efforts certainly would not meet the goal of comfort.  As such, we agreed on DNR, pursue comfort measures.  Will try to keep him alive for couple hours as she and sister attempted to arrive to the hospital.  I did inform her that it is quite likely he would pass before their arrival but we would make sure he was comfortable his symptoms were to worsen.  Code status:   Code Status: DNR   Disposition: In-patient comfort care  Time spent for the meeting: 10 minutes    Lanier Clam, MD  September 11, 2022, 9:15 AM

## 2022-09-14 NOTE — Assessment & Plan Note (Signed)
See problem #1 above.

## 2022-09-14 NOTE — Progress Notes (Addendum)
       CROSS COVER NOTE  NAME: Jeffrey Meadows MRN: [id number] DOB : [birth year]    Date of Service   Sep 08, 2022   Patient is an 89 y.o. male with a PMH significant for hypertension, COPD, and alcohol abuse.  He came in with hematemesis and coffee-ground emesis, he endorses NSAID use and daily drinking.      HPI/Events of Note   Notified by RN of patient with HR 130s to 150s in A-fib RVR.  All other vital signs stable.  Patient had similar episode yesterday and was placed on Cardizem gtt.  Patient recently received 2 mg Ativan for agitation with mild effectiveness.  Additional 1 mg IV Ativan ordered.  No changes in cardiac rate seen.    Afib RVR 15 mg IV Cardizem push given.  HR improved, now 80-110s.   Interventions/ Plan   IV Cardizem       Raenette Rover, DNP, [place]

## 2022-09-14 DEATH — deceased

## 2022-10-06 ENCOUNTER — Other Ambulatory Visit (HOSPITAL_BASED_OUTPATIENT_CLINIC_OR_DEPARTMENT_OTHER): Payer: Medicare PPO

## 2022-10-16 ENCOUNTER — Ambulatory Visit: Payer: Medicare PPO | Admitting: Podiatry

## 2023-01-23 ENCOUNTER — Ambulatory Visit: Payer: Medicare PPO | Admitting: Internal Medicine

## 2023-08-12 IMAGING — DX DG CHEST 2V
2 series · 2 of 2 positions shown · non-contrast
Comparison: 02/04/2021

CLINICAL DATA: Productive cough

EXAM:
CHEST - 2 VIEW

[chest pa]
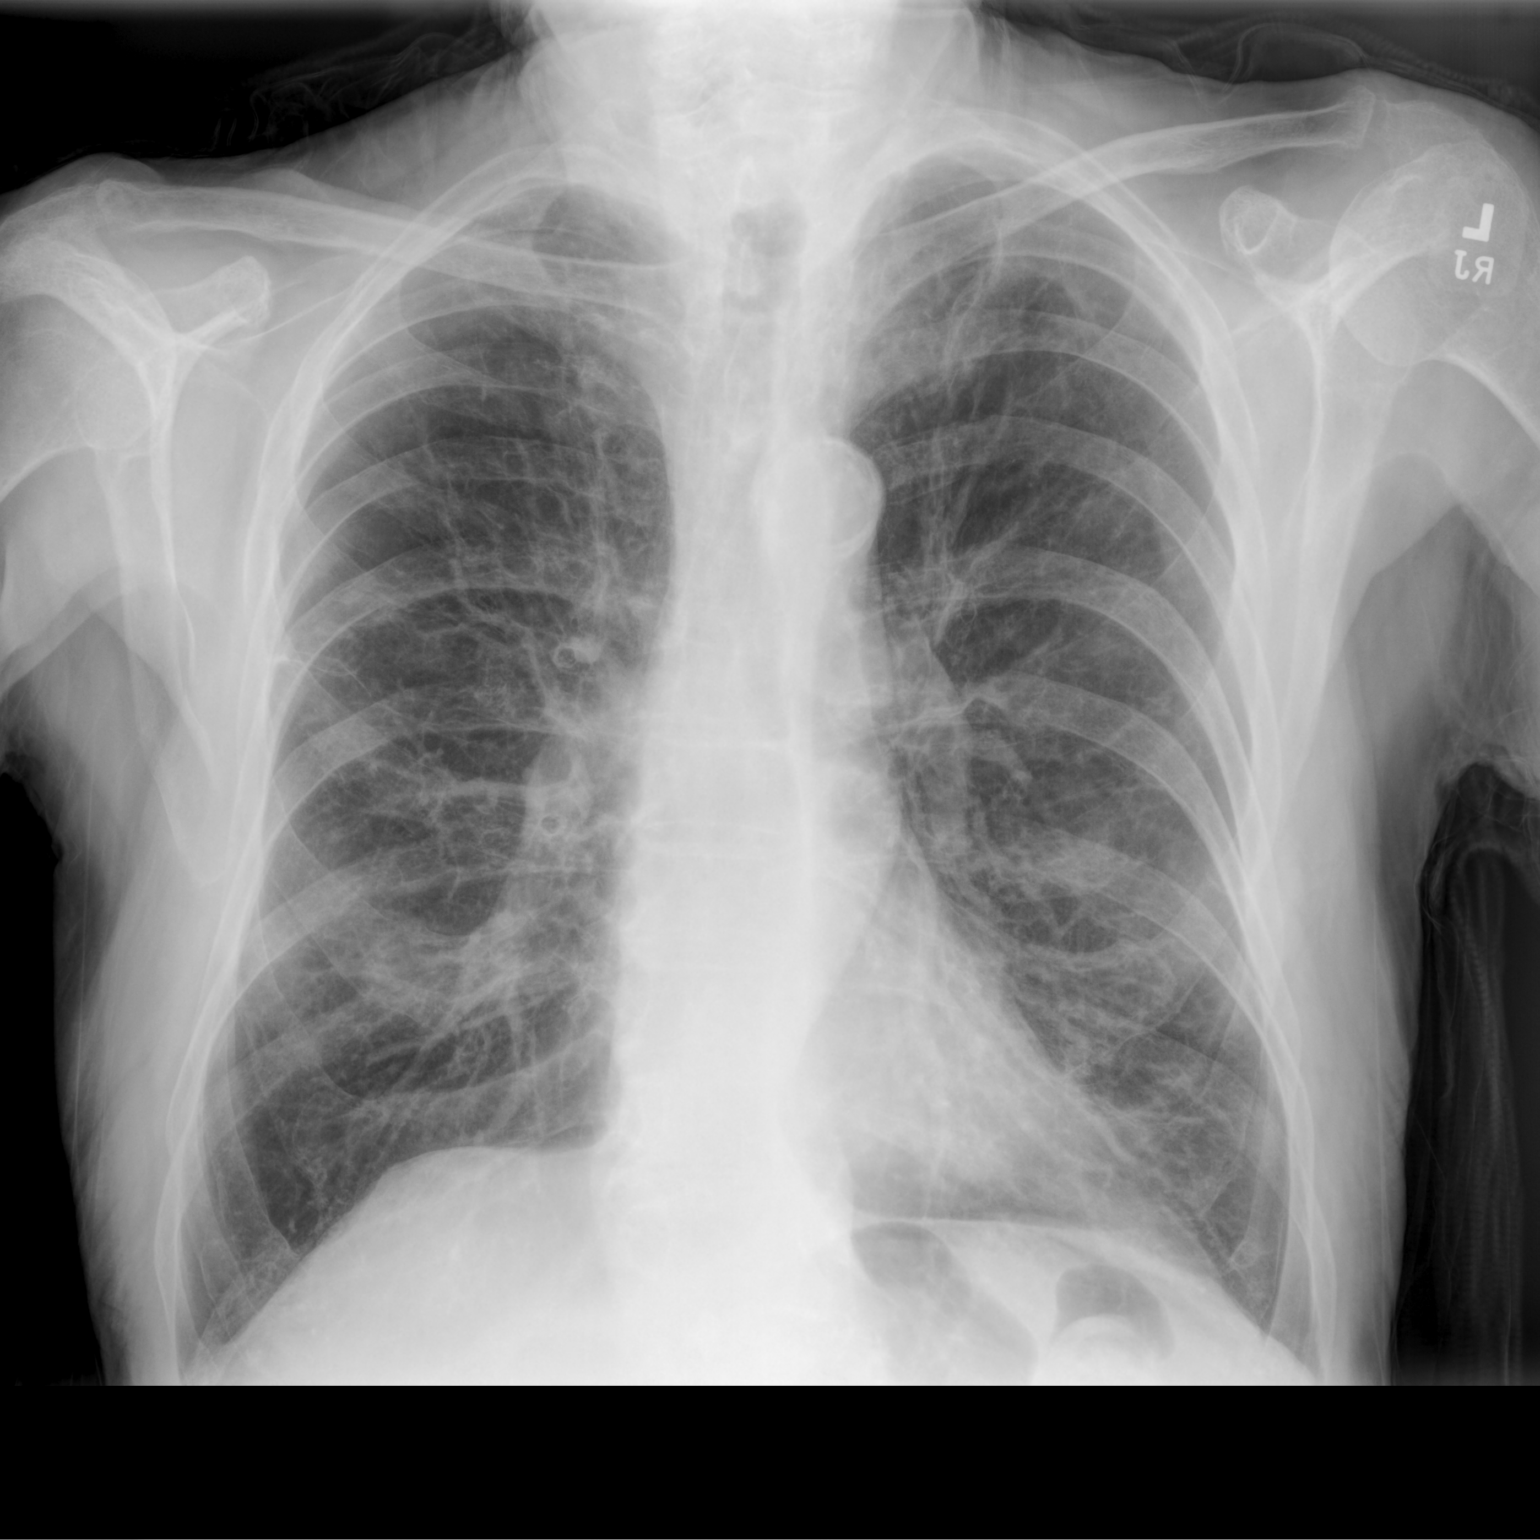

[chest lat]
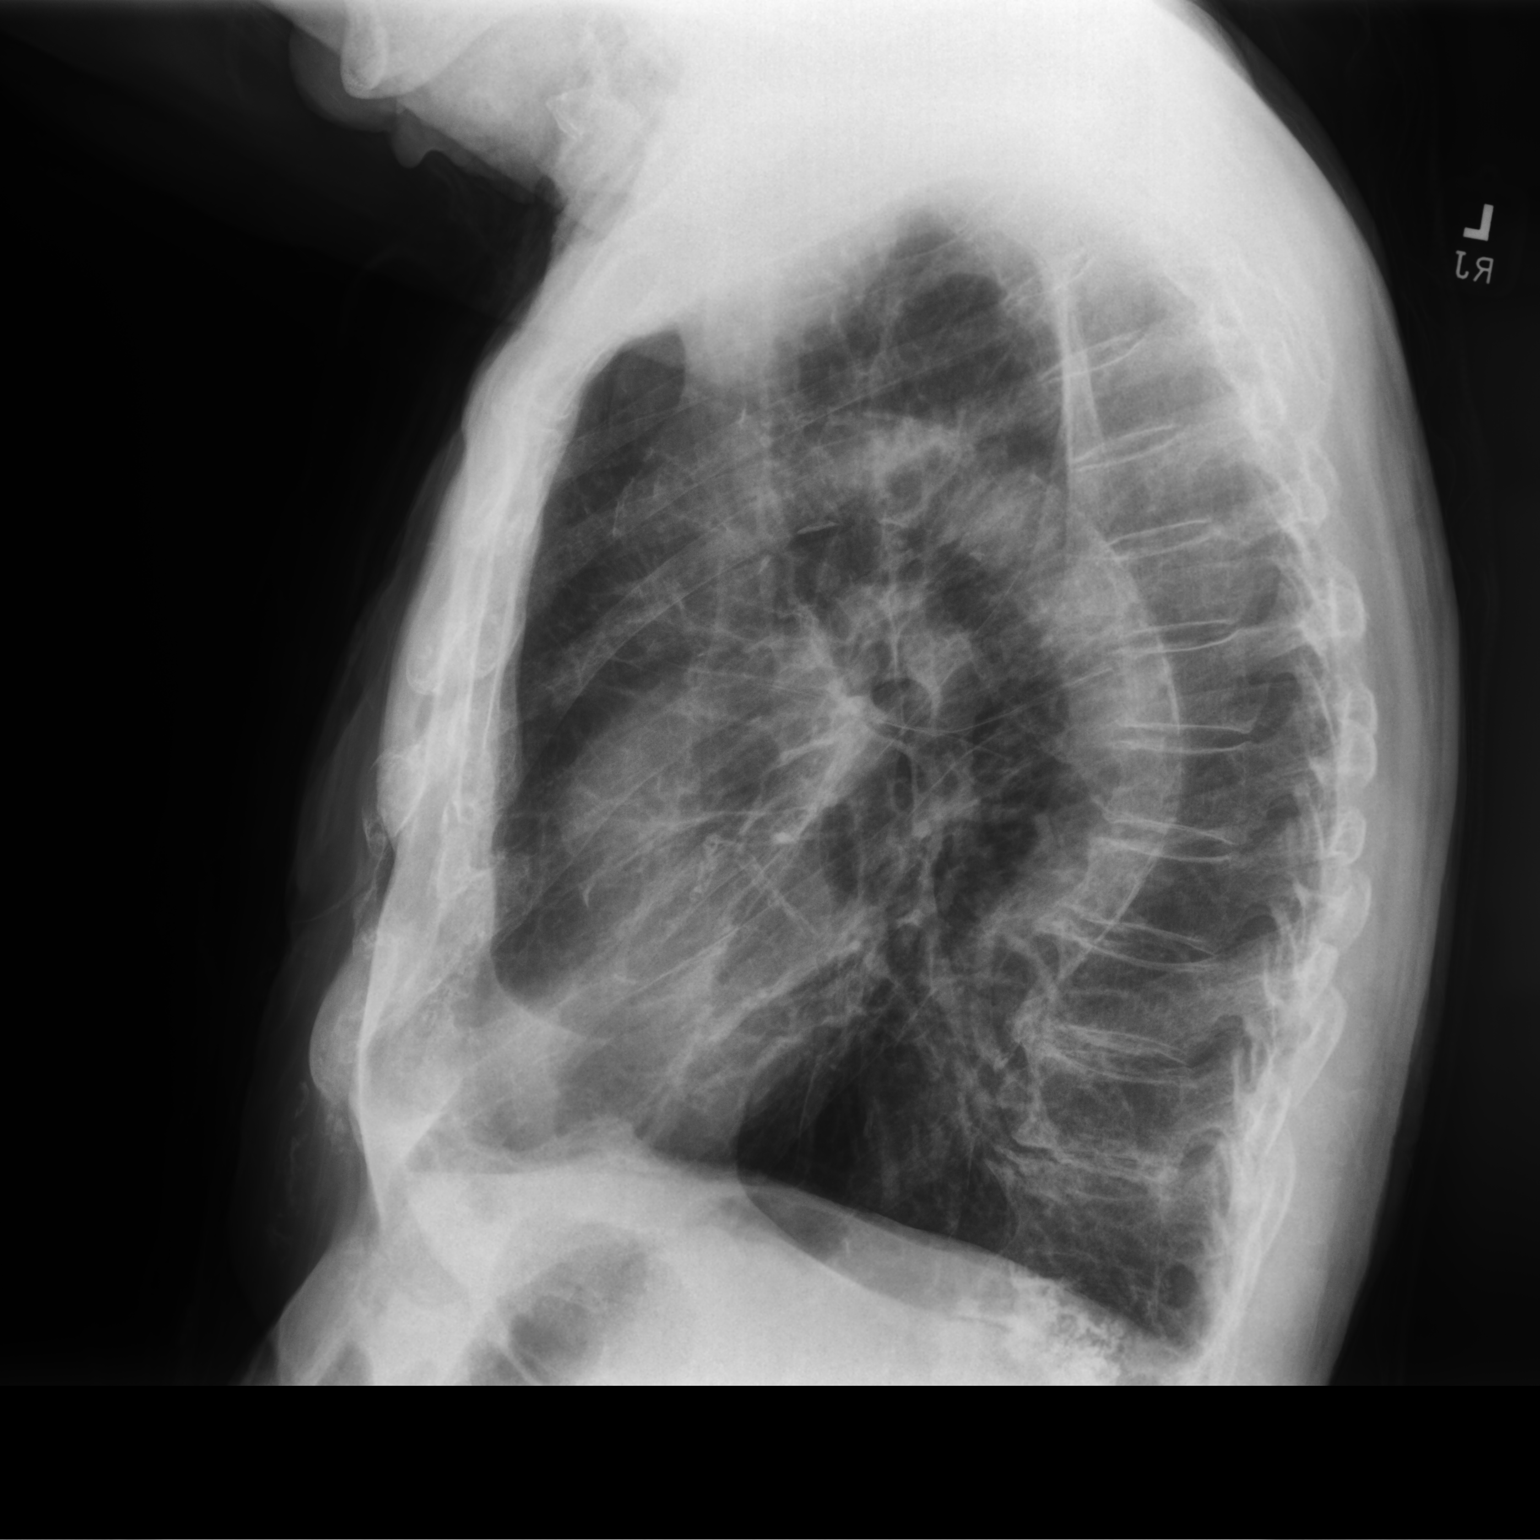

[2 of 2 positions shown; findings below may reference images not displayed]

FINDINGS: Cardiac size is within normal limits. Thoracic aorta is tortuous.
Increase in AP diameter of chest and flattening of diaphragms
suggest COPD. There is peribronchial thickening. There is no focal
pulmonary consolidation. There is no pleural effusion or
pneumothorax.
IMPRESSION: COPD. Bronchitis. There is no focal pulmonary consolidation. There
is no pleural effusion.
